# Patient Record
Sex: Male | Born: 1935
Health system: Southern US, Community
[De-identification: ages and names within clinical notes are randomized; demographics above are authoritative.]

## PROBLEM LIST (undated history)

## (undated) DIAGNOSIS — G4730 Sleep apnea, unspecified: Secondary | ICD-10-CM

## (undated) DIAGNOSIS — F0390 Unspecified dementia without behavioral disturbance: Secondary | ICD-10-CM

## (undated) DIAGNOSIS — I10 Essential (primary) hypertension: Secondary | ICD-10-CM

## (undated) DIAGNOSIS — I2510 Atherosclerotic heart disease of native coronary artery without angina pectoris: Secondary | ICD-10-CM

## (undated) DIAGNOSIS — F32A Depression, unspecified: Secondary | ICD-10-CM

## (undated) DIAGNOSIS — IMO0001 Reserved for inherently not codable concepts without codable children: Secondary | ICD-10-CM

## (undated) DIAGNOSIS — K219 Gastro-esophageal reflux disease without esophagitis: Secondary | ICD-10-CM

## (undated) DIAGNOSIS — H9190 Unspecified hearing loss, unspecified ear: Secondary | ICD-10-CM

## (undated) DIAGNOSIS — I639 Cerebral infarction, unspecified: Secondary | ICD-10-CM

## (undated) DIAGNOSIS — I252 Old myocardial infarction: Secondary | ICD-10-CM

## (undated) DIAGNOSIS — M542 Cervicalgia: Secondary | ICD-10-CM

## (undated) DIAGNOSIS — I4892 Unspecified atrial flutter: Secondary | ICD-10-CM

## (undated) DIAGNOSIS — R209 Unspecified disturbances of skin sensation: Secondary | ICD-10-CM

## (undated) DIAGNOSIS — R569 Unspecified convulsions: Secondary | ICD-10-CM

## (undated) DIAGNOSIS — M549 Dorsalgia, unspecified: Secondary | ICD-10-CM

## (undated) DIAGNOSIS — F1011 Alcohol abuse, in remission: Secondary | ICD-10-CM

## (undated) DIAGNOSIS — F329 Major depressive disorder, single episode, unspecified: Secondary | ICD-10-CM

## (undated) DIAGNOSIS — E119 Type 2 diabetes mellitus without complications: Secondary | ICD-10-CM

## (undated) HISTORY — DX: Unspecified atrial flutter: I48.92

## (undated) HISTORY — DX: Unspecified hearing loss, unspecified ear: H91.90

## (undated) HISTORY — DX: Cervicalgia: M54.2

## (undated) HISTORY — DX: Alcohol abuse, in remission: F10.11

## (undated) HISTORY — DX: Cerebral infarction, unspecified: I63.9

## (undated) HISTORY — PX: HERNIA REPAIR: SHX51

## (undated) HISTORY — DX: Sleep apnea, unspecified: G47.30

## (undated) HISTORY — DX: Atherosclerotic heart disease of native coronary artery without angina pectoris: I25.10

## (undated) HISTORY — PX: ROTATOR CUFF REPAIR: SHX139

## (undated) HISTORY — DX: Unspecified disturbances of skin sensation: R20.9

---

## 1999-03-14 ENCOUNTER — Emergency Department (HOSPITAL_COMMUNITY): Admission: EM | Admit: 1999-03-14 | Discharge: 1999-03-14 | Payer: Self-pay | Admitting: Emergency Medicine

## 2000-06-28 ENCOUNTER — Inpatient Hospital Stay (HOSPITAL_COMMUNITY): Admission: EM | Admit: 2000-06-28 | Discharge: 2000-06-29 | Payer: Self-pay | Admitting: Emergency Medicine

## 2000-06-28 ENCOUNTER — Encounter: Payer: Self-pay | Admitting: *Deleted

## 2001-10-31 ENCOUNTER — Inpatient Hospital Stay (HOSPITAL_COMMUNITY): Admission: AD | Admit: 2001-10-31 | Discharge: 2001-11-03 | Payer: Self-pay | Admitting: Cardiology

## 2001-10-31 ENCOUNTER — Encounter: Payer: Self-pay | Admitting: Emergency Medicine

## 2002-10-02 ENCOUNTER — Encounter: Payer: Self-pay | Admitting: Internal Medicine

## 2002-10-02 ENCOUNTER — Encounter: Admission: RE | Admit: 2002-10-02 | Discharge: 2002-10-02 | Payer: Self-pay | Admitting: Internal Medicine

## 2002-10-17 ENCOUNTER — Encounter: Admission: RE | Admit: 2002-10-17 | Discharge: 2002-10-17 | Payer: Self-pay | Admitting: Internal Medicine

## 2002-10-17 ENCOUNTER — Encounter: Payer: Self-pay | Admitting: Internal Medicine

## 2003-07-24 ENCOUNTER — Encounter: Payer: Self-pay | Admitting: Emergency Medicine

## 2003-07-24 ENCOUNTER — Inpatient Hospital Stay (HOSPITAL_COMMUNITY): Admission: EM | Admit: 2003-07-24 | Discharge: 2003-07-26 | Payer: Self-pay | Admitting: Emergency Medicine

## 2003-07-25 ENCOUNTER — Encounter: Payer: Self-pay | Admitting: Internal Medicine

## 2003-08-07 ENCOUNTER — Encounter: Admission: RE | Admit: 2003-08-07 | Discharge: 2003-09-12 | Payer: Self-pay | Admitting: Internal Medicine

## 2004-06-28 ENCOUNTER — Emergency Department (HOSPITAL_COMMUNITY): Admission: EM | Admit: 2004-06-28 | Discharge: 2004-06-28 | Payer: Self-pay | Admitting: Emergency Medicine

## 2004-10-11 ENCOUNTER — Emergency Department (HOSPITAL_COMMUNITY): Admission: EM | Admit: 2004-10-11 | Discharge: 2004-10-11 | Payer: Self-pay | Admitting: Emergency Medicine

## 2004-10-14 ENCOUNTER — Ambulatory Visit: Payer: Self-pay | Admitting: Internal Medicine

## 2004-10-15 ENCOUNTER — Ambulatory Visit: Payer: Self-pay | Admitting: Internal Medicine

## 2004-10-21 ENCOUNTER — Encounter: Admission: RE | Admit: 2004-10-21 | Discharge: 2004-10-21 | Payer: Self-pay | Admitting: Internal Medicine

## 2004-10-25 ENCOUNTER — Encounter: Admission: RE | Admit: 2004-10-25 | Discharge: 2004-10-25 | Payer: Self-pay | Admitting: Internal Medicine

## 2004-12-09 ENCOUNTER — Ambulatory Visit: Payer: Self-pay | Admitting: Internal Medicine

## 2005-01-06 ENCOUNTER — Ambulatory Visit: Payer: Self-pay | Admitting: Internal Medicine

## 2005-03-09 ENCOUNTER — Ambulatory Visit: Payer: Self-pay | Admitting: Internal Medicine

## 2005-03-26 ENCOUNTER — Ambulatory Visit: Payer: Self-pay | Admitting: Internal Medicine

## 2005-04-13 ENCOUNTER — Ambulatory Visit: Payer: Self-pay | Admitting: Internal Medicine

## 2005-04-15 ENCOUNTER — Ambulatory Visit: Payer: Self-pay | Admitting: Internal Medicine

## 2005-08-18 ENCOUNTER — Ambulatory Visit: Payer: Self-pay | Admitting: Internal Medicine

## 2005-08-23 ENCOUNTER — Ambulatory Visit: Payer: Self-pay | Admitting: Internal Medicine

## 2005-09-10 ENCOUNTER — Ambulatory Visit: Payer: Self-pay | Admitting: Internal Medicine

## 2005-12-06 ENCOUNTER — Ambulatory Visit: Admission: RE | Admit: 2005-12-06 | Discharge: 2005-12-06 | Payer: Self-pay | Admitting: Cardiology

## 2006-04-25 ENCOUNTER — Emergency Department (HOSPITAL_COMMUNITY): Admission: EM | Admit: 2006-04-25 | Discharge: 2006-04-25 | Payer: Self-pay | Admitting: Emergency Medicine

## 2006-07-12 ENCOUNTER — Ambulatory Visit: Payer: Self-pay | Admitting: Internal Medicine

## 2006-07-13 ENCOUNTER — Encounter: Admission: RE | Admit: 2006-07-13 | Discharge: 2006-07-13 | Payer: Self-pay | Admitting: Internal Medicine

## 2006-07-13 ENCOUNTER — Ambulatory Visit: Payer: Self-pay | Admitting: Internal Medicine

## 2006-08-02 ENCOUNTER — Ambulatory Visit: Payer: Self-pay | Admitting: Internal Medicine

## 2006-08-16 ENCOUNTER — Ambulatory Visit: Payer: Self-pay | Admitting: Internal Medicine

## 2006-10-30 ENCOUNTER — Emergency Department (HOSPITAL_COMMUNITY): Admission: EM | Admit: 2006-10-30 | Discharge: 2006-10-30 | Payer: Self-pay | Admitting: Emergency Medicine

## 2006-10-31 ENCOUNTER — Ambulatory Visit: Payer: Self-pay | Admitting: Internal Medicine

## 2006-12-03 ENCOUNTER — Emergency Department (HOSPITAL_COMMUNITY): Admission: EM | Admit: 2006-12-03 | Discharge: 2006-12-03 | Payer: Self-pay | Admitting: Emergency Medicine

## 2006-12-06 ENCOUNTER — Ambulatory Visit: Payer: Self-pay | Admitting: Internal Medicine

## 2007-01-25 DIAGNOSIS — I10 Essential (primary) hypertension: Secondary | ICD-10-CM | POA: Insufficient documentation

## 2007-01-25 DIAGNOSIS — K219 Gastro-esophageal reflux disease without esophagitis: Secondary | ICD-10-CM

## 2007-01-25 DIAGNOSIS — I509 Heart failure, unspecified: Secondary | ICD-10-CM | POA: Insufficient documentation

## 2007-01-25 DIAGNOSIS — E785 Hyperlipidemia, unspecified: Secondary | ICD-10-CM | POA: Insufficient documentation

## 2007-01-25 DIAGNOSIS — G4730 Sleep apnea, unspecified: Secondary | ICD-10-CM | POA: Insufficient documentation

## 2007-01-25 DIAGNOSIS — E291 Testicular hypofunction: Secondary | ICD-10-CM

## 2007-03-08 ENCOUNTER — Ambulatory Visit: Payer: Self-pay | Admitting: Internal Medicine

## 2007-03-08 DIAGNOSIS — F1011 Alcohol abuse, in remission: Secondary | ICD-10-CM

## 2007-03-08 DIAGNOSIS — Z8719 Personal history of other diseases of the digestive system: Secondary | ICD-10-CM

## 2007-03-08 DIAGNOSIS — I6350 Cerebral infarction due to unspecified occlusion or stenosis of unspecified cerebral artery: Secondary | ICD-10-CM | POA: Insufficient documentation

## 2007-03-08 DIAGNOSIS — I2510 Atherosclerotic heart disease of native coronary artery without angina pectoris: Secondary | ICD-10-CM | POA: Insufficient documentation

## 2007-03-13 ENCOUNTER — Telehealth (INDEPENDENT_AMBULATORY_CARE_PROVIDER_SITE_OTHER): Payer: Self-pay | Admitting: *Deleted

## 2007-03-14 ENCOUNTER — Ambulatory Visit: Payer: Self-pay | Admitting: Internal Medicine

## 2007-03-14 DIAGNOSIS — F341 Dysthymic disorder: Secondary | ICD-10-CM | POA: Insufficient documentation

## 2007-03-22 ENCOUNTER — Telehealth (INDEPENDENT_AMBULATORY_CARE_PROVIDER_SITE_OTHER): Payer: Self-pay | Admitting: *Deleted

## 2007-03-28 ENCOUNTER — Telehealth: Payer: Self-pay | Admitting: Internal Medicine

## 2007-04-03 ENCOUNTER — Encounter: Payer: Self-pay | Admitting: Internal Medicine

## 2007-04-05 ENCOUNTER — Telehealth: Payer: Self-pay | Admitting: Internal Medicine

## 2007-04-06 ENCOUNTER — Ambulatory Visit: Payer: Self-pay | Admitting: Internal Medicine

## 2007-04-06 DIAGNOSIS — R634 Abnormal weight loss: Secondary | ICD-10-CM | POA: Insufficient documentation

## 2007-04-12 ENCOUNTER — Ambulatory Visit: Payer: Self-pay | Admitting: Internal Medicine

## 2007-04-14 ENCOUNTER — Encounter: Payer: Self-pay | Admitting: Internal Medicine

## 2007-04-27 ENCOUNTER — Telehealth (INDEPENDENT_AMBULATORY_CARE_PROVIDER_SITE_OTHER): Payer: Self-pay | Admitting: *Deleted

## 2007-05-10 ENCOUNTER — Ambulatory Visit: Payer: Self-pay | Admitting: Psychiatry

## 2007-05-10 ENCOUNTER — Inpatient Hospital Stay (HOSPITAL_COMMUNITY): Admission: RE | Admit: 2007-05-10 | Discharge: 2007-05-14 | Payer: Self-pay | Admitting: Psychiatry

## 2007-05-10 ENCOUNTER — Other Ambulatory Visit: Payer: Self-pay

## 2007-05-12 ENCOUNTER — Encounter: Payer: Self-pay | Admitting: Internal Medicine

## 2007-05-18 ENCOUNTER — Telehealth: Payer: Self-pay | Admitting: Internal Medicine

## 2007-05-31 ENCOUNTER — Ambulatory Visit: Payer: Self-pay | Admitting: Internal Medicine

## 2007-06-27 ENCOUNTER — Telehealth (INDEPENDENT_AMBULATORY_CARE_PROVIDER_SITE_OTHER): Payer: Self-pay | Admitting: *Deleted

## 2007-08-08 ENCOUNTER — Telehealth (INDEPENDENT_AMBULATORY_CARE_PROVIDER_SITE_OTHER): Payer: Self-pay | Admitting: *Deleted

## 2007-08-09 ENCOUNTER — Ambulatory Visit: Payer: Self-pay | Admitting: Internal Medicine

## 2007-08-15 ENCOUNTER — Ambulatory Visit: Payer: Self-pay | Admitting: Internal Medicine

## 2007-08-21 ENCOUNTER — Ambulatory Visit: Payer: Self-pay | Admitting: Internal Medicine

## 2007-09-04 ENCOUNTER — Telehealth (INDEPENDENT_AMBULATORY_CARE_PROVIDER_SITE_OTHER): Payer: Self-pay | Admitting: *Deleted

## 2007-09-11 ENCOUNTER — Ambulatory Visit: Payer: Self-pay | Admitting: Internal Medicine

## 2007-09-13 ENCOUNTER — Ambulatory Visit: Payer: Self-pay | Admitting: Gastroenterology

## 2007-10-03 DIAGNOSIS — M129 Arthropathy, unspecified: Secondary | ICD-10-CM | POA: Insufficient documentation

## 2007-10-03 DIAGNOSIS — I209 Angina pectoris, unspecified: Secondary | ICD-10-CM

## 2007-10-03 DIAGNOSIS — K2270 Barrett's esophagus without dysplasia: Secondary | ICD-10-CM

## 2007-10-11 ENCOUNTER — Ambulatory Visit: Payer: Self-pay | Admitting: Internal Medicine

## 2007-11-13 ENCOUNTER — Other Ambulatory Visit: Payer: Self-pay

## 2007-11-14 ENCOUNTER — Ambulatory Visit: Payer: Self-pay | Admitting: Psychiatry

## 2007-11-14 ENCOUNTER — Inpatient Hospital Stay (HOSPITAL_COMMUNITY): Admission: EM | Admit: 2007-11-14 | Discharge: 2007-11-20 | Payer: Self-pay | Admitting: Psychiatry

## 2007-11-30 ENCOUNTER — Telehealth: Payer: Self-pay | Admitting: Internal Medicine

## 2007-12-05 ENCOUNTER — Inpatient Hospital Stay (HOSPITAL_COMMUNITY): Admission: EM | Admit: 2007-12-05 | Discharge: 2007-12-07 | Payer: Self-pay | Admitting: Emergency Medicine

## 2007-12-05 ENCOUNTER — Ambulatory Visit: Payer: Self-pay | Admitting: Internal Medicine

## 2007-12-08 ENCOUNTER — Other Ambulatory Visit: Payer: Self-pay | Admitting: Emergency Medicine

## 2007-12-08 ENCOUNTER — Inpatient Hospital Stay (HOSPITAL_COMMUNITY): Admission: RE | Admit: 2007-12-08 | Discharge: 2007-12-16 | Payer: Self-pay | Admitting: Psychiatry

## 2007-12-11 ENCOUNTER — Ambulatory Visit: Payer: Self-pay | Admitting: Gastroenterology

## 2007-12-18 ENCOUNTER — Ambulatory Visit: Payer: Self-pay | Admitting: Internal Medicine

## 2007-12-19 ENCOUNTER — Telehealth: Payer: Self-pay | Admitting: Internal Medicine

## 2007-12-27 ENCOUNTER — Telehealth: Payer: Self-pay | Admitting: Internal Medicine

## 2008-01-19 ENCOUNTER — Telehealth: Payer: Self-pay | Admitting: Internal Medicine

## 2008-02-12 ENCOUNTER — Telehealth (INDEPENDENT_AMBULATORY_CARE_PROVIDER_SITE_OTHER): Payer: Self-pay | Admitting: *Deleted

## 2008-02-14 ENCOUNTER — Ambulatory Visit: Payer: Self-pay | Admitting: Internal Medicine

## 2008-02-14 DIAGNOSIS — R7309 Other abnormal glucose: Secondary | ICD-10-CM

## 2008-02-18 ENCOUNTER — Emergency Department (HOSPITAL_COMMUNITY): Admission: EM | Admit: 2008-02-18 | Discharge: 2008-02-18 | Payer: Self-pay | Admitting: Emergency Medicine

## 2008-02-28 ENCOUNTER — Telehealth: Payer: Self-pay | Admitting: Internal Medicine

## 2008-03-12 ENCOUNTER — Ambulatory Visit: Payer: Self-pay | Admitting: Internal Medicine

## 2008-03-12 DIAGNOSIS — M109 Gout, unspecified: Secondary | ICD-10-CM | POA: Insufficient documentation

## 2008-04-03 ENCOUNTER — Encounter: Payer: Self-pay | Admitting: Internal Medicine

## 2008-05-13 ENCOUNTER — Telehealth (INDEPENDENT_AMBULATORY_CARE_PROVIDER_SITE_OTHER): Payer: Self-pay | Admitting: *Deleted

## 2008-08-28 ENCOUNTER — Encounter: Payer: Self-pay | Admitting: Internal Medicine

## 2008-10-14 ENCOUNTER — Telehealth (INDEPENDENT_AMBULATORY_CARE_PROVIDER_SITE_OTHER): Payer: Self-pay | Admitting: *Deleted

## 2009-07-01 ENCOUNTER — Encounter: Payer: Self-pay | Admitting: Cardiology

## 2009-08-08 ENCOUNTER — Ambulatory Visit: Payer: Self-pay | Admitting: Cardiology

## 2009-08-08 ENCOUNTER — Encounter: Payer: Self-pay | Admitting: Emergency Medicine

## 2009-08-08 ENCOUNTER — Ambulatory Visit: Payer: Self-pay | Admitting: Diagnostic Radiology

## 2009-08-08 ENCOUNTER — Inpatient Hospital Stay (HOSPITAL_COMMUNITY): Admission: EM | Admit: 2009-08-08 | Discharge: 2009-08-16 | Payer: Self-pay | Admitting: Cardiology

## 2009-08-11 ENCOUNTER — Encounter: Payer: Self-pay | Admitting: Internal Medicine

## 2009-08-18 ENCOUNTER — Telehealth: Payer: Self-pay | Admitting: Cardiology

## 2009-08-19 ENCOUNTER — Ambulatory Visit: Payer: Self-pay | Admitting: Cardiology

## 2009-08-19 LAB — CONVERTED CEMR LAB: POC INR: 2.4

## 2009-08-26 ENCOUNTER — Ambulatory Visit: Payer: Self-pay | Admitting: Cardiovascular Disease

## 2009-08-26 LAB — CONVERTED CEMR LAB: POC INR: 3

## 2009-09-02 ENCOUNTER — Ambulatory Visit: Payer: Self-pay | Admitting: Cardiology

## 2009-09-03 ENCOUNTER — Encounter: Payer: Self-pay | Admitting: Physician Assistant

## 2009-09-03 ENCOUNTER — Ambulatory Visit: Payer: Self-pay | Admitting: Cardiology

## 2009-09-03 DIAGNOSIS — I4892 Unspecified atrial flutter: Secondary | ICD-10-CM

## 2009-09-03 DIAGNOSIS — I429 Cardiomyopathy, unspecified: Secondary | ICD-10-CM | POA: Insufficient documentation

## 2009-09-09 ENCOUNTER — Ambulatory Visit: Payer: Self-pay | Admitting: Cardiovascular Disease

## 2009-09-09 LAB — CONVERTED CEMR LAB: POC INR: 2

## 2009-09-17 ENCOUNTER — Ambulatory Visit: Payer: Self-pay | Admitting: Cardiology

## 2009-09-17 ENCOUNTER — Encounter (INDEPENDENT_AMBULATORY_CARE_PROVIDER_SITE_OTHER): Payer: Self-pay | Admitting: Cardiology

## 2009-09-17 LAB — CONVERTED CEMR LAB: POC INR: 2

## 2009-09-30 ENCOUNTER — Ambulatory Visit: Payer: Self-pay | Admitting: Cardiology

## 2009-10-13 ENCOUNTER — Ambulatory Visit (HOSPITAL_COMMUNITY): Admission: RE | Admit: 2009-10-13 | Discharge: 2009-10-13 | Payer: Self-pay | Admitting: Cardiology

## 2009-10-13 ENCOUNTER — Ambulatory Visit: Payer: Self-pay | Admitting: Internal Medicine

## 2009-10-13 ENCOUNTER — Ambulatory Visit: Payer: Self-pay | Admitting: Cardiology

## 2009-10-13 ENCOUNTER — Encounter: Payer: Self-pay | Admitting: Cardiology

## 2009-10-13 ENCOUNTER — Ambulatory Visit: Payer: Self-pay

## 2009-10-23 ENCOUNTER — Encounter: Payer: Self-pay | Admitting: Cardiology

## 2009-10-28 ENCOUNTER — Ambulatory Visit: Payer: Self-pay | Admitting: Cardiovascular Disease

## 2009-11-11 ENCOUNTER — Ambulatory Visit: Payer: Self-pay | Admitting: Cardiology

## 2009-12-09 ENCOUNTER — Ambulatory Visit: Payer: Self-pay | Admitting: Cardiology

## 2009-12-09 DIAGNOSIS — E669 Obesity, unspecified: Secondary | ICD-10-CM | POA: Insufficient documentation

## 2009-12-18 ENCOUNTER — Encounter (INDEPENDENT_AMBULATORY_CARE_PROVIDER_SITE_OTHER): Payer: Self-pay | Admitting: *Deleted

## 2010-01-06 ENCOUNTER — Ambulatory Visit: Payer: Self-pay | Admitting: Internal Medicine

## 2010-01-13 ENCOUNTER — Telehealth: Payer: Self-pay | Admitting: Cardiology

## 2010-01-14 ENCOUNTER — Encounter: Payer: Self-pay | Admitting: Cardiology

## 2010-01-16 ENCOUNTER — Telehealth: Payer: Self-pay | Admitting: Cardiology

## 2010-01-20 ENCOUNTER — Ambulatory Visit: Payer: Self-pay | Admitting: Cardiovascular Disease

## 2010-01-20 LAB — CONVERTED CEMR LAB: POC INR: 1.3

## 2010-01-27 ENCOUNTER — Telehealth (INDEPENDENT_AMBULATORY_CARE_PROVIDER_SITE_OTHER): Payer: Self-pay | Admitting: *Deleted

## 2010-01-30 ENCOUNTER — Ambulatory Visit: Payer: Self-pay | Admitting: Cardiology

## 2010-01-30 LAB — CONVERTED CEMR LAB: POC INR: 1.8

## 2010-02-03 ENCOUNTER — Encounter (INDEPENDENT_AMBULATORY_CARE_PROVIDER_SITE_OTHER): Payer: Self-pay | Admitting: *Deleted

## 2010-02-16 ENCOUNTER — Ambulatory Visit: Payer: Self-pay | Admitting: Cardiovascular Disease

## 2010-02-27 ENCOUNTER — Ambulatory Visit: Payer: Self-pay | Admitting: Cardiology

## 2010-02-27 LAB — CONVERTED CEMR LAB: POC INR: 1.6

## 2010-03-13 ENCOUNTER — Ambulatory Visit: Payer: Self-pay | Admitting: Cardiovascular Disease

## 2010-03-13 LAB — CONVERTED CEMR LAB: POC INR: 2.4

## 2010-04-10 ENCOUNTER — Ambulatory Visit: Payer: Self-pay | Admitting: Cardiology

## 2010-04-17 ENCOUNTER — Ambulatory Visit: Payer: Self-pay | Admitting: Cardiology

## 2010-04-27 ENCOUNTER — Ambulatory Visit: Payer: Self-pay | Admitting: Cardiology

## 2010-05-04 ENCOUNTER — Ambulatory Visit: Payer: Self-pay | Admitting: Cardiovascular Disease

## 2010-05-15 ENCOUNTER — Ambulatory Visit: Payer: Self-pay | Admitting: Internal Medicine

## 2010-05-29 ENCOUNTER — Ambulatory Visit: Payer: Self-pay | Admitting: Cardiology

## 2010-06-12 ENCOUNTER — Ambulatory Visit: Payer: Self-pay | Admitting: Internal Medicine

## 2010-06-12 LAB — CONVERTED CEMR LAB: POC INR: 2.6

## 2010-06-19 ENCOUNTER — Ambulatory Visit: Payer: Self-pay | Admitting: Cardiology

## 2010-09-25 ENCOUNTER — Encounter: Payer: Self-pay | Admitting: Cardiology

## 2010-11-03 NOTE — Medication Information (Signed)
Summary: rov/jk   Anticoagulant Therapy  Managed by: Weston Brass, PharmD Referring MD: Dr. Dietrich Pates PCP: Dr Jackie Plum Supervising MD: Antoine Poche MD, Fayrene Fearing Indication 1: Atrial Flutter Lab Used: [facility] [place] Site: [address] INR POC 3.6 INR RANGE 2-3  Dietary changes: no    Health status changes: no    Bleeding/hemorrhagic complications: no    Recent/future hospitalizations: no    Any changes in medication regimen? no    Recent/future dental: no  Any missed doses?: no       Is patient compliant with meds? yes       Allergies: No Known Drug Allergies  Anticoagulation Management History:      The patient is taking warfarin and comes in today for a routine follow up visit.  Positive risk factors for bleeding include an age of 74 years or older and history of CVA/TIA.  The bleeding index is 'intermediate risk'.  Positive CHADS2 values include History of CHF, History of HTN, and Prior Stroke/CVA/TIA.  Negative CHADS2 values include Age > 74 years old.  Anticoagulation responsible provider: Antoine Poche MD, Fayrene Fearing.  INR POC: 3.6.  Cuvette Lot#: [id number].  Exp: 07/2011.    Anticoagulation Management Assessment/Plan:      The patient's current anticoagulation dose is Coumadin 10 mg tabs: Use as directed by [facility] Clinic.  The target INR is 2.0-3.0.  The next INR is due 06/12/2010.  Anticoagulation instructions were given to patient.  Results were reviewed/authorized by Weston Brass, PharmD.  He was notified by Gweneth Fritter, PharmD Candidate.         Prior Anticoagulation Instructions: INR 3.7  Skip today's dose.  Then resume normal schedule of taking 1 tablet (10mg ) every day except take 1.5 tablets (15mg ) on Fridays.  Recheck in 2 weeks.   Current Anticoagulation Instructions: INR 3.6  Today, August 26th, skip your dose.  Then being new Coumadin schedule of taking 1 tablet (10mg ) every day.  Recheck in 2 weeks.

## 2010-11-03 NOTE — Medication Information (Signed)
Summary: rov/tm  Anticoagulant Therapy  Managed by: Weston Brass, PharmD Referring MD: Dr. Dietrich Pates PCP: Dr Jackie Plum Supervising MD: Tenny Craw MD, Gunnar Fusi Indication 1: Atrial Flutter Lab Used: [facility] [place] Site: [address] INR POC 3.7 INR RANGE 2-3  Dietary changes: yes       Details: Began taking a "green supplement" powder that is mixed with water.  Has "all the daily vitamins and minerals" - take it as a shake in the morning.  Health status changes: no    Bleeding/hemorrhagic complications: no    Recent/future hospitalizations: no    Any changes in medication regimen? no    Recent/future dental: no  Any missed doses?: no       Is patient compliant with meds? yes       Allergies: No Known Drug Allergies  Anticoagulation Management History:      The patient is taking warfarin and comes in today for a routine follow up visit.  Positive risk factors for bleeding include an age of 50 years or older and history of CVA/TIA.  The bleeding index is 'intermediate risk'.  Positive CHADS2 values include History of CHF, History of HTN, and Prior Stroke/CVA/TIA.  Negative CHADS2 values include Age > 55 years old.  Anticoagulation responsible provider: Tenny Craw MD, Gunnar Fusi.  INR POC: 3.7.  Cuvette Lot#: [id number].  Exp: 07/2011.    Anticoagulation Management Assessment/Plan:      The patient's current anticoagulation dose is Coumadin 10 mg tabs: Use as directed by [facility]madin Clinic.  The target INR is 2.0-3.0.  The next INR is due 05/29/2010.  Anticoagulation instructions were given to patient.  Results were reviewed/authorized by Weston Brass, PharmD.  He was notified by Gweneth Fritter, PharmD Candidate.         Prior Anticoagulation Instructions: INR 1.4 Today take 1.5 tablets then continue 1  tablet everyday except 1.5 tablets on Fridays. Recheck in 11 days.   Current Anticoagulation Instructions: INR 3.7  Skip today's dose.  Then resume normal schedule of taking 1  tablet (10mg ) every day except take 1.5 tablets (15mg ) on Fridays.  Recheck in 2 weeks.

## 2010-11-03 NOTE — Letter (Signed)
Summary: Primary Care Appointment Letter  [facility] [place]  [address] [place], [place] [address]   Phone: [phone]  Fax: [phone]    12/18/2009 MRN: [id number]  Austin Sawyer [address] [place], [place]  [address]  Dear Austin Sawyer,   Your Primary Care Physician Jasper E. Paz MD has indicated that:    ____x___it is time to schedule an appointment for yearly exam and labs last office visit w/ Dr. Drue Novel was 02/2008, this is necessary to continue refilling medication.    _______you missed your appointment on______ and need to call and          reschedule.    _______you need to have lab work done.    _______you need to schedule an appointment discuss lab or test results.    _______you need to call to reschedule your appointment that is                       scheduled on _________.     Please call our office as soon as possible. Our phone number is [phone]          ___[phone]___. Our office is open 8a-5p, Monday through Friday.     Thank you,    [facility]uler

## 2010-11-03 NOTE — Assessment & Plan Note (Signed)
Summary: rov pt had echo 10-13-09      Allergies Added: NKDA  Visit Type:  Follow-up Primary Provider:  Dr Greggory Stallion Osei-Bonsu  CC:  Cardiomyopathy.  History of Present Illness: The patient presents for followup of his reduced ejection fraction. This was noted on transesophageal echocardiography in November of last year at the time of an atrial flutter ablation. The EF at that time was 35% which was reduced compared with his previous normal ejection fractions. He didn't followup in our clinic following this. An echocardiogram done recently demonstrates his ejection fraction to now be back to 65%. He currently denies any new symptoms. He has been having no new dyspnea though he is relatively sedentary. He is only exercising one time per week. He does not have any resting shortness of breath and has had no PND or orthopnea. He has had no palpitations, presyncope or syncope. He has had no chest, neck or arm discomfort.  Current Medications (verified): 1)  Vytorin 10-80 Mg Tabs (Ezetimibe-Simvastatin) .Marland Kitchen.. 1 By Mouth At Bedtime 2)  Androgel Pump 1 % Gel (Testosterone) .... Apply 2 Pumpfulls Daily. 3)  Micardis Hct 80-25 Mg Tabs (Telmisartan-Hctz) .Marland Kitchen.. 1 By Mouth Once Daily-Office Visit Due Now 4)  Flomax 0.4 Mg Cp24 (Tamsulosin Hcl) .Marland Kitchen.. 1 By Mouth Qd 5)  Nexium 40 Mg Cpdr (Esomeprazole Magnesium) .Marland Kitchen.. 1 By Mouth Once Daily- 6)  Flonase 50 Mcg/act Susp (Fluticasone Propionate) .... 2 Sprays Each Nostril Daily As Needed. 7)  Colchicine 0.6 Mg  Tabs (Colchicine) .Marland Kitchen.. 1 By Mouth Q4 Hours As Needed. 8)  Remeron 15 Mg  Tabs (Mirtazapine) .Marland Kitchen.. 1daily At Bedtime. 9)  No More Benzos--Xanax 10)  Coumadin 10 Mg Tabs (Warfarin Sodium) .... Use As Directed By Coumadin Clinic 11)  Carvedilol 12.5 Mg Tabs (Carvedilol) .... Take One Tablet By Mouth Twice A Day  Allergies (verified): No Known Drug Allergies  Past History:  Past Medical History: GERD, positive history of  Barrett's Hyperlipidemia Hypertension Congestive heart failure Coronary artery disease, stent in 2001 H/O ETOH abuse Anxiety-depression Gout Atrial flutter status post ablation  Past Surgical History: Inguinal herniorrhaphy R shoulder, roatator cuff  Review of Systems       As stated in the HPI and negative for all other systems.   Vital Signs:  Patient profile:   76 year old male Height:      69 inches Weight:      240 pounds BMI:     35.57 Pulse rate:   68 / minute Resp:     16 per minute BP sitting:   108 / 74  (right arm)  Vitals Entered By: Marrion Coy, CNA (December 09, 2009 10:11 AM)  Physical Exam  General:  Well developed, well nourished, in no acute distress. Head:  normocephalic and atraumatic Neck:  Neck supple, no JVD. No masses, thyromegaly or abnormal cervical nodes. Chest Wall:  no deformities or breast masses noted Lungs:  Clear bilaterally to auscultation and percussion. Heart:  Non-displaced PMI, chest non-tender; regular rate and rhythm, S1, S2 without murmurs, rubs or gallops. Carotid upstroke normal, no bruit. Normal abdominal aortic size, no bruits. Femorals normal pulses, no bruits. Pedals normal pulses. No edema, no varicosities. Abdomen:  Bowel sounds positive; abdomen soft and non-tender without masses, organomegaly, or hernias noted. No hepatosplenomegaly, obese Msk:  Back normal, normal gait. Muscle strength and tone normal. Extremities:  No clubbing or cyanosis. Neurologic:  Alert and oriented x 3. Skin:  Intact without lesions or rashes. Psych:  Normal affect.   EKG  Procedure date:  12/09/2009  Findings:      sinus rhythm, rate 68, axis within normal limits, intervals within normal limits, left ventricular hypertrophy with repolarization changes  Impression & Recommendations:  Problem # 1:  CARDIOMYOPATHY, SECONDARY (ICD-425.9) His EF is improved. I suspect the atrial flutter was the cause. He seems to be maintaining sinus rhythm  but should continue the other meds as listed as well. I will not titrate meds further at this point.  Problem # 2:  ATRIAL FLUTTER (ICD-427.32) He seems to be maintaining sinus rhythm. There is no evidence of recurrent arrhythmia I will most likely discontinue Coumadin when I see him next. Orders: EKG w/ Interpretation (93000)  Problem # 3:  CORONARY ARTERY DISEASE (ICD-414.00) He has had no ongoing chest pain and I will defer to his primary physician. Orders: EKG w/ Interpretation (93000)  Problem # 4:  OBESITY, UNSPECIFIED (ICD-278.00) I spent a long time at this meeting discussing the need for weight loss with diet and exercise.  Patient Instructions: 1)  Your physician recommends that you schedule a follow-up appointment in: 6 months with Dr Antoine Poche 2)  Your physician recommends that you continue on your current medications as directed. Please refer to the Current Medication list given to you today. 3)  You have been diagnosed with atrial fibrillation.  Atrial fibrillation is a condition in which one of the upper chambers of the heart has extra electrical cells causing it to beat very fast.  Please see the handout/brochure given to you today for further information.

## 2010-11-03 NOTE — Assessment & Plan Note (Signed)
Summary: 6 month rov 427.31  pfh,rn      Allergies Added: NKDA  Visit Type:  Follow-up Primary Provider:  Dr Greggory Stallion Osei-Bonsu  CC:  Cardiomyopathy and Atrial Flutter.  History of Present Illness: The patient presents for followup of cardiomyopathy and previous flutter. He has had ablation. At the last appointment he was maintaining sinus rhythm. Brought him back today to consider discontinuing Coumadin therapy. Since then he's had no symptomatic tachypalpitations. He has had no presyncope or syncope. He has had no shortness of breath, PND or orthopnea. He denies any chest pressure, neck or arm discomfort. He has lost about 20 pounds over the past year or so. He says he is not eating as much after getting dentures. He says the food doesn't taste as good.  Current Medications (verified): 1)  Vytorin 10-80 Mg Tabs (Ezetimibe-Simvastatin) .Marland Kitchen.. 1 By Mouth At Bedtime 2)  Androgel Pump 1 % Gel (Testosterone) .... Apply 2 Pumpfulls Daily. 3)  Micardis Hct 80-25 Mg Tabs (Telmisartan-Hctz) .Marland Kitchen.. 1 By Mouth Once Daily-Office Visit Due Before Additional Refills. 4)  Flomax 0.4 Mg Cp24 (Tamsulosin Hcl) .Marland Kitchen.. 1 By Mouth Qd 5)  Nexium 40 Mg Cpdr (Esomeprazole Magnesium) .Marland Kitchen.. 1 By Mouth Once Daily- 6)  Flonase 50 Mcg/act Susp (Fluticasone Propionate) .... 2 Sprays Each Nostril Daily As Needed. 7)  Colchicine 0.6 Mg  Tabs (Colchicine) .Marland Kitchen.. 1 By Mouth Daily 8)  Remeron 15 Mg  Tabs (Mirtazapine) .Marland Kitchen.. 1daily At Bedtime. 9)  No More Benzos--Xanax 10)  Coumadin 10 Mg Tabs (Warfarin Sodium) .... Use As Directed By Coumadin Clinic 11)  Carvedilol 12.5 Mg Tabs (Carvedilol) .... Take One Tablet By Mouth Twice A Day 12)  Indomethacin 25 Mg Caps (Indomethacin) .... Take One To Two Capsule Three Times A Day Prn  Allergies (verified): No Known Drug Allergies  Past History:  Past Medical History: Reviewed history from 12/09/2009 and no changes required. GERD, positive history of  Barrett's Hyperlipidemia Hypertension Congestive heart failure Coronary artery disease, stent in 2001 H/O ETOH abuse Anxiety-depression Gout Atrial flutter status post ablation  Past Surgical History: Reviewed history from 12/09/2009 and no changes required. Inguinal herniorrhaphy R shoulder, roatator cuff  Review of Systems       As stated in the HPI and negative for all other systems.   Vital Signs:  Patient profile:   76 year old male Height:      69 inches Weight:      221 pounds BMI:     32.75 Pulse rate:   52 / minute Resp:     18 per minute BP sitting:   118 / 90  (right arm)  Vitals Entered By: Marrion Coy, CNA (June 19, 2010 12:03 PM)  Physical Exam  General:  Well developed, well nourished, in no acute distress. Head:  normocephalic and atraumatic Eyes:  PERRLA/EOM intact; conjunctiva and lids normal. Mouth:  Upper dentures. Oral mucosa normal. Neck:  Neck supple, no JVD. No masses, thyromegaly or abnormal cervical nodes. Chest Wall:  no deformities or breast masses noted Lungs:  Clear bilaterally to auscultation and percussion. Abdomen:  Bowel sounds positive; abdomen soft and non-tender without masses, organomegaly, or hernias noted. No hepatosplenomegaly, obese Msk:  Back normal, normal gait. Muscle strength and tone normal. Extremities:  No clubbing or cyanosis. Neurologic:  Alert and oriented x 3. Skin:  Intact without lesions or rashes. Cervical Nodes:  no significant adenopathy Inguinal Nodes:  no significant adenopathy Psych:  Normal affect.   Detailed Cardiovascular Exam  Neck    Carotids: Carotids full and equal bilaterally without bruits.      Neck Veins: Normal, no JVD.    Heart    Inspection: no deformities or lifts noted.      Palpation: normal PMI with no thrills palpable.      Auscultation: regular rate and rhythm, S1, S2 without murmurs, rubs, gallops, or clicks.    Vascular    Abdominal Aorta: no palpable masses,  pulsations, or audible bruits.      Femoral Pulses: normal femoral pulses bilaterally.      Pedal Pulses: normal pedal pulses bilaterally.      Radial Pulses: normal radial pulses bilaterally.      Peripheral Circulation: no clubbing, cyanosis, or edema noted with normal capillary refill.     EKG  Procedure date:  06/19/2010  Findings:      Sinus bradycardia, rate 52, axis within normal limits, intervals within normal limits, LVH  Impression & Recommendations:  Problem # 1:  ATRIAL FLUTTER (ICD-427.32) At this point I believe the patient's atrial flutter is cured. Therefore, he can come off of his Coumadin.  Orders: EKG w/ Interpretation (93000)  Problem # 2:  CARDIOMYOPATHY, SECONDARY (ICD-425.9) The patient will continue with the meds as listed. His ejection fraction greatly improved with medication and treatment of his flutter.  Problem # 3:  OBESITY, UNSPECIFIED (ICD-278.00) We discussed the need to lose weight with diet and exercise.  Problem # 4:  HYPERTENSION (ICD-401.9) His blood pressure is well controlled with the meds as listed.  Patient Instructions: 1)  Your physician recommends that you schedule a follow-up appointment in: 1 year with Dr Antoine Poche 2)  Your physician has recommended you make the following change in your medication: Stop Coumadin

## 2010-11-03 NOTE — Miscellaneous (Signed)
Summary: PATIENT HAS CHANGED PCP   Clinical Lists Changes

## 2010-11-03 NOTE — Medication Information (Signed)
Summary: rov/sp  Anticoagulant Therapy  Managed by: Bethena Midget, RN, BSN Referring MD: Dr. Dietrich Pates PCP: Dr Jackie Plum Supervising MD: Eden Emms MD, Theron Arista Indication 1: Atrial Flutter Lab Used: [facility] [place] Site: [address] INR POC 1.4 INR RANGE 2-3  Dietary changes: no    Health status changes: no    Bleeding/hemorrhagic complications: no    Recent/future hospitalizations: no    Any changes in medication regimen? no    Recent/future dental: no  Any missed doses?: no       Is patient compliant with meds? yes       Allergies: No Known Drug Allergies  Anticoagulation Management History:      The patient is taking warfarin and comes in today for a routine follow up visit.  Positive risk factors for bleeding include an age of 1 years or older and history of CVA/TIA.  The bleeding index is 'intermediate risk'.  Positive CHADS2 values include History of CHF, History of HTN, and Prior Stroke/CVA/TIA.  Negative CHADS2 values include Age > 19 years old.  Anticoagulation responsible provider: Eden Emms MD, Theron Arista.  INR POC: 1.4.  Cuvette Lot#: [id number].  Exp: 07/2011.    Anticoagulation Management Assessment/Plan:      The patient's current anticoagulation dose is Coumadin 10 mg tabs: Use as directed by Coumadin Clinic.  The target INR is 2.0-3.0.  The next INR is due 05/15/2010.  Anticoagulation instructions were given to patient.  Results were reviewed/authorized by Bethena Midget, RN, BSN.  He was notified by Bethena Midget, RN, BSN.         Prior Anticoagulation Instructions: INR 4.8  Skip Tuesday and Wednesday's dose of Coumadin then decrease dose to 1 tablet every day except 1 1/2 tablets on Friday.   Current Anticoagulation Instructions: INR 1.4 Today take 1.5 tablets then continue 1  tablet everyday except 1.5 tablets on Fridays. Recheck in 11 days.

## 2010-11-03 NOTE — Medication Information (Signed)
Summary: rov/eac  Anticoagulant Therapy  Managed by: Cloyde Reams, RN, BSN Referring MD: Dr. Dietrich Pates PCP: Dr Jackie Plum Supervising MD: Myrtis Ser MD, Tinnie Gens Indication 1: Atrial Flutter Lab Used: [facility] [place] Site: [address] INR POC 2.0 INR RANGE 2-3  Dietary changes: no    Health status changes: no    Bleeding/hemorrhagic complications: no    Recent/future hospitalizations: no    Any changes in medication regimen? no    Recent/future dental: no  Any missed doses?: no       Is patient compliant with meds? yes      Comments: Pt states he has only been taking 1 tablet daily.  Boosted x 1 week then resumed previous dosage.  Allergies (verified): No Known Drug Allergies  Anticoagulation Management History:      The patient is taking warfarin and comes in today for a routine follow up visit.  Positive risk factors for bleeding include an age of 5 years or older and history of CVA/TIA.  The bleeding index is 'intermediate risk'.  Positive CHADS2 values include History of CHF, History of HTN, and Prior Stroke/CVA/TIA.  Negative CHADS2 values include Age > 68 years old.  Anticoagulation responsible provider: Myrtis Ser MD, Tinnie Gens.  INR POC: 2.0.  Cuvette Lot#: [id number].  Exp: 02/2011.    Anticoagulation Management Assessment/Plan:      The patient's current anticoagulation dose is Coumadin 10 mg tabs: Use as directed by Coumadin Clinic.  The target INR is 2.0-3.0.  The next INR is due 01/06/2010.  Anticoagulation instructions were given to patient.  Results were reviewed/authorized by Cloyde Reams, RN, BSN.  He was notified by Cloyde Reams, RN, BSN.         Prior Anticoagulation Instructions: INR 1.7  Take 1.5 Tablets today.  Then start NEW dosing schedule of 1.5 tablets on Thursday, and 1 tablet all other days. Return to clinic in 4 weeks at which time pt is also seeing Dr. Antoine Poche.  Current Anticoagulation Instructions: INR 2.0  Start taking 1  tablet daily except 1.5 tablets on Tuesdays.  Recheck in 4 weeks.

## 2010-11-03 NOTE — Miscellaneous (Signed)
  Clinical Lists Changes  Observations: Added new observation of ECHOINTERP: - Left ventricle: The cavity size was normal. Wall thickness was       increased increased in a pattern of mild to moderate LVH. Systolic       function was normal. The estimated ejection fraction was in the       range of 60% to 65%. Wall motion was normal; there were no       regional wall motion abnormalities.     - Aortic valve: Trivial regurgitation.     - Mitral valve: Calcified annulus. (10/13/2009 11:26)      Echocardiogram  Procedure date:  10/13/2009  Findings:      - Left ventricle: The cavity size was normal. Wall thickness was       increased increased in a pattern of mild to moderate LVH. Systolic       function was normal. The estimated ejection fraction was in the       range of 60% to 65%. Wall motion was normal; there were no       regional wall motion abnormalities.     - Aortic valve: Trivial regurgitation.     - Mitral valve: Calcified annulus.

## 2010-11-03 NOTE — Letter (Signed)
Summary: Dr. Fredirick Maudlin Office - Release for Treatment  Dr. Fredirick Maudlin Office - Release for Treatment   Imported By: Marylou Mccoy 03/11/2010 10:13:22  _____________________________________________________________________  External Attachment:    Type:   Image     Comment:   External Document

## 2010-11-03 NOTE — Progress Notes (Signed)
Summary: status of paperwork   Phone Note Call from Patient Call back at Home Phone [phone]   Caller: Patient Summary of Call: pt called stating he had dropped off paperwork for holding his coumadin for dental work when he saw couamdin clinic last week and was wanting to see if it was ready, will send to [facility] to call pt when ready Initial call taken by: Meredith Staggers, RN,  January 13, 2010 3:24 PM  Follow-up for Phone Call        Done Follow-up by: Rollene Rotunda, MD, [facility],  January 14, 2010 6:03 PM     Appended Document: status of paperwork pt aware paperwork has been completed.  information will be faxed to Dr Baldo Ash Little's office at the pt's request.  Appended Document: status of paperwork Refaxed paperwork for Pam..to Dr.Carl Little's Office

## 2010-11-03 NOTE — Medication Information (Signed)
Summary: rov/tm  Anticoagulant Therapy  Managed by: Elaina Pattee, PharmD Referring MD: Dr. Dietrich Pates PCP: Dr Jackie Plum Supervising MD: Excell Seltzer MD, Casimiro Needle Indication 1: Atrial Flutter Lab Used: [facility] [place] Site: [address] INR POC 1.3 INR RANGE 2-3  Dietary changes: no    Health status changes: no    Bleeding/hemorrhagic complications: no    Recent/future hospitalizations: no    Any changes in medication regimen? no    Recent/future dental: yes     Details: Tooth extraction this Thurs 4/21.  Coumadin on hold since Saturday. Cleared by cardiologist.  Any missed doses?: yes     Details: Holding for tooth extraction.  Is patient compliant with meds? yes       Allergies: No Known Drug Allergies  Anticoagulation Management History:      The patient is taking warfarin and comes in today for a routine follow up visit.  Positive risk factors for bleeding include an age of 69 years or older and history of CVA/TIA.  The bleeding index is 'intermediate risk'.  Positive CHADS2 values include History of CHF, History of HTN, and Prior Stroke/CVA/TIA.  Negative CHADS2 values include Age > 47 years old.  Anticoagulation responsible provider: Excell Seltzer MD, Casimiro Needle.  INR POC: 1.3.  Cuvette Lot#: [id number].  Exp: 03/2011.    Anticoagulation Management Assessment/Plan:      The patient's current anticoagulation dose is Coumadin 10 mg tabs: Use as directed by Coumadin Clinic.  The target INR is 2.0-3.0.  The next INR is due 01/30/2010.  Anticoagulation instructions were given to patient.  Results were reviewed/authorized by Elaina Pattee, PharmD.  He was notified by Elaina Pattee, PharmD.         Prior Anticoagulation Instructions: INR 3.3 Skip Wednesdays dose then resume 10mg s daily except 15mg s on Tuesdays. Recheck in 2 weeks.   Current Anticoagulation Instructions: INR 1.3. On 4/23, take 1.5 tablets for 2 days, then take 1 tablet daily except 1.5 tablets on  Fridays.

## 2010-11-03 NOTE — Medication Information (Signed)
Summary: rov/sp  Anticoagulant Therapy  Managed by: Minerva Areola, RN Referring MD: Dr. Dietrich Pates PCP: Dr Jackie Plum Supervising MD: Myrtis Ser MD, Tinnie Gens Indication 1: Atrial Flutter Lab Used: [facility] [place] Site: [address] INR POC 1.6 INR RANGE 2-3  Dietary changes: no    Health status changes: no    Bleeding/hemorrhagic complications: no    Recent/future hospitalizations: no    Any changes in medication regimen? no    Recent/future dental: yes     Details: 4 weeks ago; off for 3 days  Any missed doses?: no       Is patient compliant with meds? yes       Current Medications (verified): 1)  Vytorin 10-80 Mg Tabs (Ezetimibe-Simvastatin) .Marland Kitchen.. 1 By Mouth At Bedtime 2)  Androgel Pump 1 % Gel (Testosterone) .... Apply 2 Pumpfulls Daily. 3)  Micardis Hct 80-25 Mg Tabs (Telmisartan-Hctz) .Marland Kitchen.. 1 By Mouth Once Daily-Office Visit Due Now 4)  Flomax 0.4 Mg Cp24 (Tamsulosin Hcl) .Marland Kitchen.. 1 By Mouth Qd 5)  Nexium 40 Mg Cpdr (Esomeprazole Magnesium) .Marland Kitchen.. 1 By Mouth Once Daily- 6)  Flonase 50 Mcg/act Susp (Fluticasone Propionate) .... 2 Sprays Each Nostril Daily As Needed. 7)  Colchicine 0.6 Mg  Tabs (Colchicine) .Marland Kitchen.. 1 By Mouth Daily 8)  Remeron 15 Mg  Tabs (Mirtazapine) .Marland Kitchen.. 1daily At Bedtime. 9)  No More Benzos--Xanax 10)  Coumadin 10 Mg Tabs (Warfarin Sodium) .... Use As Directed By [facility] Clinic 11)  Carvedilol 12.5 Mg Tabs (Carvedilol) .... Take One Tablet By Mouth Twice A Day 12)  Indomethacin 25 Mg Caps (Indomethacin) .... Take One To Two Capsule Three Times A Day Prn  Allergies (verified): No Known Drug Allergies  Anticoagulation Management History:      The patient is taking warfarin and comes in today for a routine follow up visit.  Positive risk factors for bleeding include an age of 74 years or older and history of CVA/TIA.  The bleeding index is 'intermediate risk'.  Positive CHADS2 values include History of CHF, History of HTN, and Prior  Stroke/CVA/TIA.  Negative CHADS2 values include Age > 72 years old.  Anticoagulation responsible provider: Myrtis Ser MD, Tinnie Gens.  INR POC: 1.6.  Cuvette Lot#: [id number].  Exp: 05/2011.    Anticoagulation Management Assessment/Plan:      The patient's current anticoagulation dose is Coumadin 10 mg tabs: Use as directed by [facility].  The target INR is 2.0-3.0.  The next INR is due 03/13/2010.  Anticoagulation instructions were given to patient.  Results were reviewed/authorized by Minerva Areola, RN.  He was notified by Minerva Areola, RN.         Prior Anticoagulation Instructions: INR 3.4  Skip tomorrow's dose of Coumadin then resume same dose of 1 tablet every day except 1 1/2 tablets on Friday   Current Anticoagulation Instructions: The patient is to continue with the same dose of coumadin.  This dosage except take 2 tablets today and one and a half tablets tomorrow then resume one tablet every day except one and a half tablets on Fridays.

## 2010-11-03 NOTE — Progress Notes (Signed)
Summary: pt needs letter of clearence  done   Phone Note Call from Patient Call back at Home Phone [phone]   Caller: Patient Reason for Call: Talk to Nurse, Talk to Doctor Summary of Call: pt needs to have dental work done at Dr. Remer Macho office. The office phone number is  [phone]. He gave the release form to nurse in coumadin and he needs this letter of clearence asap so he can get the procedure scheduled. Initial call taken by: Omer Jack,  January 16, 2010 10:00 AM  Follow-up for Phone Call        spoke with pt, the dentist office hasn't received paperwork that was faxed 4/13.  will refax paperwork today. Follow-up by: Charolotte Capuchin, RN,  January 16, 2010 10:09 AM

## 2010-11-03 NOTE — Medication Information (Signed)
Summary: rov/tm  Anticoagulant Therapy  Managed by: Eda Keys, PharmD Referring MD: Dr. Dietrich Pates PCP: Dr Jackie Plum Supervising MD: Riley Kill MD, Maisie Fus Indication 1: Atrial Flutter Lab Used: [facility] [place] Site: [address] INR POC 1.7 INR RANGE 2-3  Dietary changes: no    Health status changes: no    Bleeding/hemorrhagic complications: no    Recent/future hospitalizations: no    Any changes in medication regimen? no    Recent/future dental: no  Any missed doses?: yes     Details: Pt believes he missed two doses...  Is patient compliant with meds? yes       Current Medications (verified): 1)  Vytorin 10-80 Mg Tabs (Ezetimibe-Simvastatin) .Marland Kitchen.. 1 By Mouth At Bedtime 2)  Androgel Pump 1 % Gel (Testosterone) .... Apply 2 Pumpfulls Daily. 3)  Micardis Hct 80-25 Mg Tabs (Telmisartan-Hctz) .Marland Kitchen.. 1 By Mouth Once Daily-Office Visit Due Now 4)  Flomax 0.4 Mg Cp24 (Tamsulosin Hcl) .Marland Kitchen.. 1 By Mouth Qd 5)  Nexium 40 Mg Cpdr (Esomeprazole Magnesium) .Marland Kitchen.. 1 By Mouth Once Daily- 6)  Flonase 50 Mcg/act Susp (Fluticasone Propionate) .... 2 Sprays Each Nostril Daily As Needed. 7)  Colchicine 0.6 Mg  Tabs (Colchicine) .Marland Kitchen.. 1 By Mouth Q4 Hours As Needed. 8)  Remeron 15 Mg  Tabs (Mirtazapine) .Marland Kitchen.. 1daily At Bedtime. 9)  No More Benzos--Xanax 10)  Coumadin 10 Mg Tabs (Warfarin Sodium) .... Use As Directed By [facility] Clinic 11)  Carvedilol 12.5 Mg Tabs (Carvedilol) .... Take One Tablet By Mouth Twice A Day  Allergies (verified): No Known Drug Allergies  Anticoagulation Management History:      The patient is taking warfarin and comes in today for a routine follow up visit.  Positive risk factors for bleeding include an age of 74 years or older and history of CVA/TIA.  The bleeding index is 'intermediate risk'.  Positive CHADS2 values include History of CHF, History of HTN, and Prior Stroke/CVA/TIA.  Negative CHADS2 values include Age > 74 years old.   Anticoagulation responsible provider: Riley Kill MD, Maisie Fus.  INR POC: 1.7.  Cuvette Lot#: [id number].  Exp: 01/2011.    Anticoagulation Management Assessment/Plan:      The patient's current anticoagulation dose is Coumadin 10 mg tabs: Use as directed by [facility].  The target INR is 2.0-3.0.  The next INR is due 12/09/2009.  Anticoagulation instructions were given to patient.  Results were reviewed/authorized by Eda Keys, PharmD.  He was notified by Eda Keys.         Prior Anticoagulation Instructions: INR 2.0 Continue 1 tablet everyday. Recheck in 2 weeks.   Current Anticoagulation Instructions: INR 1.7  Take 1.5 Tablets today.  Then start NEW dosing schedule of 1.5 tablets on Thursday, and 1 tablet all other days. Return to clinic in 4 weeks at which time pt is also seeing Dr. Antoine Poche.

## 2010-11-03 NOTE — Progress Notes (Signed)
Summary: needs ov  Phone Note Outgoing Call Call back at [facility] Phone [phone]   Summary of Call: Call from pharmacy requesting refill on micardis.  Last ov here was 2009.  Letter has already been mailed to pt (12/2009) to call & schedule ov.  I will ONLY refill meds after patient schedules a visit.  Please contact pt Shary Decamp  January 27, 2010 8:21 AM   Follow-up for Phone Call        Patient is coming in on 5.3.11 Follow-up by: Harold Barban,  January 27, 2010 8:51 AM

## 2010-11-03 NOTE — Medication Information (Signed)
Summary: rov/ewj  Anticoagulant Therapy  Managed by: Bethena Midget, RN, BSN Referring MD: Dr. Dietrich Pates PCP: Dr Jackie Plum Supervising MD: Excell Seltzer MD, Casimiro Needle Indication 1: Atrial Flutter Lab Used: [facility] [place] Site: [address] INR POC 2.0 INR RANGE 2-3  Dietary changes: no    Health status changes: no    Bleeding/hemorrhagic complications: no    Recent/future hospitalizations: no    Any changes in medication regimen? no    Recent/future dental: no  Any missed doses?: no       Is patient compliant with meds? yes       Allergies: No Known Drug Allergies  Anticoagulation Management History:      The patient is taking warfarin and comes in today for a routine follow up visit.  Positive risk factors for bleeding include an age of 74 years or older and history of CVA/TIA.  The bleeding index is 'intermediate risk'.  Positive CHADS2 values include History of CHF, History of HTN, and Prior Stroke/CVA/TIA.  Negative CHADS2 values include Age > 74 years old.  Anticoagulation responsible provider: Excell Seltzer MD, Casimiro Needle.  INR POC: 2.0.  Cuvette Lot#: [id number].  Exp: 01/2011.    Anticoagulation Management Assessment/Plan:      The patient's current anticoagulation dose is Coumadin 10 mg tabs: Use as directed by [facility] Clinic.  The target INR is 2.0-3.0.  The next INR is due 11/11/2009.  Anticoagulation instructions were given to patient.  Results were reviewed/authorized by Bethena Midget, RN, BSN.  He was notified by Bethena Midget, RN, BSN.         Prior Anticoagulation Instructions: INR 1.7  Take 1.5 tablets (15mg ) today then resume same dosage 10mg  daily. Recheck in 2 weeks.    Current Anticoagulation Instructions: INR 2.0 Continue 1 tablet everyday. Recheck in 2 weeks.

## 2010-11-03 NOTE — Medication Information (Signed)
Summary: rov/cb  Anticoagulant Therapy  Managed by: Cloyde Reams, RN, BSN Referring MD: Dr. Dietrich Pates PCP: Dr Jackie Plum Supervising MD: Juanda Chance MD, Buffie Herne Indication 1: Atrial Flutter Lab Used: [facility] [place] Site: [address] INR POC 1.8 INR RANGE 2-3  Dietary changes: no    Health status changes: no    Bleeding/hemorrhagic complications: no    Recent/future hospitalizations: no    Any changes in medication regimen? no    Recent/future dental: no  Any missed doses?: no       Is patient compliant with meds? yes       Allergies (verified): No Known Drug Allergies  Anticoagulation Management History:      The patient is taking warfarin and comes in today for a routine follow up visit.  Positive risk factors for bleeding include an age of 97 years or older and history of CVA/TIA.  The bleeding index is 'intermediate risk'.  Positive CHADS2 values include History of CHF, History of HTN, and Prior Stroke/CVA/TIA.  Negative CHADS2 values include Age > 82 years old.  Anticoagulation responsible provider: Juanda Chance MD, Smitty Cords.  INR POC: 1.8.  Cuvette Lot#: [id number].  Exp: 03/2011.    Anticoagulation Management Assessment/Plan:      The patient's current anticoagulation dose is Coumadin 10 mg tabs: Use as directed by [facility] Clinic.  The target INR is 2.0-3.0.  The next INR is due 02/13/2010.  Anticoagulation instructions were given to patient.  Results were reviewed/authorized by Cloyde Reams, RN, BSN.  He was notified by Cloyde Reams RN.         Prior Anticoagulation Instructions: INR 1.3. On 4/23, take 1.5 tablets for 2 days, then take 1 tablet daily except 1.5 tablets on Fridays.  Current Anticoagulation Instructions: INR 1.8  Take 2 tablets today, then resume same dosage 1 tablet daily except 1.5 tablets on Fridays.  Recheck in 2 weeks.

## 2010-11-03 NOTE — Medication Information (Signed)
Summary: rov/tm  Anticoagulant Therapy  Managed by: Weston Brass, PharmD Referring MD: Dr. Dietrich Pates PCP: Dr Jackie Plum Supervising MD: Riley Kill MD, Maisie Fus Indication 1: Atrial Flutter Lab Used: [facility] [place] Site: [address] INR POC 1.6 INR RANGE 2-3  Dietary changes: no    Health status changes: yes       Details: complaining of tingling in fingers. Instructed to contact PCP  Bleeding/hemorrhagic complications: no    Recent/future hospitalizations: no    Any changes in medication regimen? no    Recent/future dental: no  Any missed doses?: yes     Details: missed 1 dose last Saturday   Is patient compliant with meds? yes       Allergies: No Known Drug Allergies  Anticoagulation Management History:      The patient is taking warfarin and comes in today for a routine follow up visit.  Positive risk factors for bleeding include an age of 65 years or older and history of CVA/TIA.  The bleeding index is 'intermediate risk'.  Positive CHADS2 values include History of CHF, History of HTN, and Prior Stroke/CVA/TIA.  Negative CHADS2 values include Age > 78 years old.  Anticoagulation responsible provider: Riley Kill MD, Maisie Fus.  INR POC: 1.6.  Cuvette Lot#: [id number].  Exp: 06/2011.    Anticoagulation Management Assessment/Plan:      The patient's current anticoagulation dose is Coumadin 10 mg tabs: Use as directed by [facility] Clinic.  The target INR is 2.0-3.0.  The next INR is due 04/27/2010.  Anticoagulation instructions were given to patient.  Results were reviewed/authorized by Weston Brass, PharmD.  He was notified by Weston Brass PharmD.         Prior Anticoagulation Instructions: INR 1.4 Continue 10mg s everyday except 15mg s on Fridays. Recheck in one week.   Current Anticoagulation Instructions: INR 1.6  Take 2 tablets today then increase dose to 1 tablet every day except 1 1/2 tablets on Monday and Friday.

## 2010-11-03 NOTE — Medication Information (Signed)
Summary: [place] [facility]  Anticoagulant Therapy  Managed by: Bethena Midget, RN, BSN Referring MD: Dr. Dietrich Pates PCP: Dr Jackie Plum Supervising MD: Graciela Husbands MD, Viviann Spare Indication 1: Atrial Flutter Lab Used: [facility] [place] Site: [address] INR POC 3.3 INR RANGE 2-3  Dietary changes: no    Health status changes: no    Bleeding/hemorrhagic complications: no    Recent/future hospitalizations: no    Any changes in medication regimen? yes       Details: Colchicine 0.6mg  daily started 12/23/09  Recent/future dental: no  Any missed doses?: yes     Details: missed a dose last week.   Is patient compliant with meds? yes      Comments: Pending extractions- letter given to Dr. Jenene Slicker nurse for clearance.  Current Medications (verified): 1)  Vytorin 10-80 Mg Tabs (Ezetimibe-Simvastatin) .Marland Kitchen.. 1 By Mouth At Bedtime 2)  Androgel Pump 1 % Gel (Testosterone) .... Apply 2 Pumpfulls Daily. 3)  Micardis Hct 80-25 Mg Tabs (Telmisartan-Hctz) .Marland Kitchen.. 1 By Mouth Once Daily-Office Visit Due Now 4)  Flomax 0.4 Mg Cp24 (Tamsulosin Hcl) .Marland Kitchen.. 1 By Mouth Qd 5)  Nexium 40 Mg Cpdr (Esomeprazole Magnesium) .Marland Kitchen.. 1 By Mouth Once Daily- 6)  Flonase 50 Mcg/act Susp (Fluticasone Propionate) .... 2 Sprays Each Nostril Daily As Needed. 7)  Colchicine 0.6 Mg  Tabs (Colchicine) .Marland Kitchen.. 1 By Mouth Daily 8)  Remeron 15 Mg  Tabs (Mirtazapine) .Marland Kitchen.. 1daily At Bedtime. 9)  No More Benzos--Xanax 10)  Coumadin 10 Mg Tabs (Warfarin Sodium) .... Use As Directed By [facility] Clinic 11)  Carvedilol 12.5 Mg Tabs (Carvedilol) .... Take One Tablet By Mouth Twice A Day 12)  Indomethacin 25 Mg Caps (Indomethacin) .... Take One To Two Capsule Three Times A Day Prn  Allergies: No Known Drug Allergies  Anticoagulation Management History:      The patient is taking warfarin and comes in today for a routine follow up visit.  Positive risk factors for bleeding include an age of 74 years or older and history of CVA/TIA.   The bleeding index is 'intermediate risk'.  Positive CHADS2 values include History of CHF, History of HTN, and Prior Stroke/CVA/TIA.  Negative CHADS2 values include Age > 48 years old.  Anticoagulation responsible provider: Graciela Husbands MD, Viviann Spare.  INR POC: 3.3.  Cuvette Lot#: [id number].  Exp: 02/2011.    Anticoagulation Management Assessment/Plan:      The patient's current anticoagulation dose is Coumadin 10 mg tabs: Use as directed by [facility] Clinic.  The target INR is 2.0-3.0.  The next INR is due 01/20/2010.  Anticoagulation instructions were given to patient.  Results were reviewed/authorized by Bethena Midget, RN, BSN.  He was notified by Bethena Midget, RN, BSN.         Prior Anticoagulation Instructions: INR 2.0  Start taking 1 tablet daily except 1.5 tablets on Tuesdays.  Recheck in 4 weeks.    Current Anticoagulation Instructions: INR 3.3 Skip Wednesdays dose then resume 10mg s daily except 15mg s on Tuesdays. Recheck in 2 weeks.

## 2010-11-03 NOTE — Medication Information (Signed)
Summary: rov/ewj  Anticoagulant Therapy  Managed by: Weston Brass, PharmD Referring MD: Dr. Dietrich Pates PCP: Dr Jackie Plum Supervising MD: Clifton James MD, Cristal Deer Indication 1: Atrial Flutter Lab Used: [facility] [place] Site: [address] INR POC 3.4 INR RANGE 2-3  Dietary changes: yes       Details: had some teeth pulled and was not able to eat his normal diet   Health status changes: no    Bleeding/hemorrhagic complications: no    Recent/future hospitalizations: no    Any changes in medication regimen? no    Recent/future dental: no  Any missed doses?: no       Is patient compliant with meds? yes       Allergies: No Known Drug Allergies  Anticoagulation Management History:      The patient is taking warfarin and comes in today for a routine follow up visit.  Positive risk factors for bleeding include an age of 14 years or older and history of CVA/TIA.  The bleeding index is 'intermediate risk'.  Positive CHADS2 values include History of CHF, History of HTN, and Prior Stroke/CVA/TIA.  Negative CHADS2 values include Age > 34 years old.  Anticoagulation responsible provider: Clifton James MD, Cristal Deer.  INR POC: 3.4.  Cuvette Lot#: [id number].  Exp: 05/2011.    Anticoagulation Management Assessment/Plan:      The patient's current anticoagulation dose is Coumadin 10 mg tabs: Use as directed by [facility].  The target INR is 2.0-3.0.  The next INR is due 02/26/2010.  Anticoagulation instructions were given to patient.  Results were reviewed/authorized by Weston Brass, PharmD.  He was notified by Weston Brass PharmD.         Prior Anticoagulation Instructions: INR 1.8  Take 2 tablets today, then resume same dosage 1 tablet daily except 1.5 tablets on Fridays.  Recheck in 2 weeks.    Current Anticoagulation Instructions: INR 3.4  Skip tomorrow's dose of Coumadin then resume same dose of 1 tablet every day except 1 1/2 tablets on Friday

## 2010-11-03 NOTE — Medication Information (Signed)
Summary: rov/mlw  Anticoagulant Therapy  Managed by: Cloyde Reams, RN, BSN Referring MD: Dr. Dietrich Pates PCP: Dr Jackie Plum Supervising MD: Tenny Craw MD, Gunnar Fusi Indication 1: Atrial Flutter Lab Used: [facility] [place] Site: [address] INR POC 1.7 INR RANGE 2-3  Dietary changes: no    Health status changes: no    Bleeding/hemorrhagic complications: no    Recent/future hospitalizations: no    Any changes in medication regimen? no    Recent/future dental: no  Any missed doses?: yes     Details: May have missed 1 dose last week??  Is patient compliant with meds? yes       Allergies (verified): No Known Drug Allergies  Anticoagulation Management History:      The patient is taking warfarin and comes in today for a routine follow up visit.  Positive risk factors for bleeding include an age of 74 years or older and history of CVA/TIA.  The bleeding index is 'intermediate risk'.  Positive CHADS2 values include History of CHF, History of HTN, and Prior Stroke/CVA/TIA.  Negative CHADS2 values include Age > 29 years old.  Anticoagulation responsible provider: Tenny Craw MD, Gunnar Fusi.  INR POC: 1.7.  Cuvette Lot#: [id number].  Exp: 11/2010.    Anticoagulation Management Assessment/Plan:      The patient's current anticoagulation dose is Coumadin 10 mg tabs: Use as directed by [facility] Clinic.  The target INR is 2.0-3.0.  The next INR is due 10/28/2009.  Anticoagulation instructions were given to patient.  Results were reviewed/authorized by Cloyde Reams, RN, BSN.  He was notified by Cloyde Reams RN.         Prior Anticoagulation Instructions: INR 1.7 Change dose to 1 pill everyday. Recheck INR in 2 weeks.   Current Anticoagulation Instructions: INR 1.7  Take 1.5 tablets (15mg ) today then resume same dosage 10mg  daily. Recheck in 2 weeks.

## 2010-11-03 NOTE — Medication Information (Signed)
Summary: rov/sp  Anticoagulant Therapy  Managed by: Weston Brass, PharmD Referring MD: Dr. Dietrich Pates PCP: Dr Jackie Plum Supervising MD: Riley Kill MD, Maisie Fus Indication 1: Atrial Flutter Lab Used: [facility] [place] Site: [address] INR POC 4.8 INR RANGE 2-3  Dietary changes: yes       Details: less vitamin K intake  Health status changes: no    Bleeding/hemorrhagic complications: no    Recent/future hospitalizations: no    Any changes in medication regimen? no    Recent/future dental: no  Any missed doses?: no       Is patient compliant with meds? yes       Allergies: No Known Drug Allergies  Anticoagulation Management History:      The patient is taking warfarin and comes in today for a routine follow up visit.  Positive risk factors for bleeding include an age of 34 years or older and history of CVA/TIA.  The bleeding index is 'intermediate risk'.  Positive CHADS2 values include History of CHF, History of HTN, and Prior Stroke/CVA/TIA.  Negative CHADS2 values include Age > 43 years old.  Anticoagulation responsible provider: Riley Kill MD, Maisie Fus.  INR POC: 4.8.  Cuvette Lot#: [id number].  Exp: 07/2011.    Anticoagulation Management Assessment/Plan:      The patient's current anticoagulation dose is Coumadin 10 mg tabs: Use as directed by [facility].  The target INR is 2.0-3.0.  The next INR is due 05/04/2010.  Anticoagulation instructions were given to patient.  Results were reviewed/authorized by Weston Brass, PharmD.  He was notified by Weston Brass PharmD.         Prior Anticoagulation Instructions: INR 1.6  Take 2 tablets today then increase dose to 1 tablet every day except 1 1/2 tablets on Monday and Friday.   Current Anticoagulation Instructions: INR 4.8  Skip Tuesday and Wednesday's dose of Coumadin then decrease dose to 1 tablet every day except 1 1/2 tablets on Friday.

## 2010-11-03 NOTE — Medication Information (Signed)
Summary: rov/jk  Anticoagulant Therapy  Managed by: Eda Keys, PharmD Referring MD: Dr. Dietrich Pates PCP: Dr Jackie Plum Supervising MD: Tenny Craw MD, Gunnar Fusi Indication 1: Atrial Flutter Lab Used: [facility] [place] Site: [address] INR POC 2.6 INR RANGE 2-3  Dietary changes: no    Health status changes: no    Bleeding/hemorrhagic complications: no    Recent/future hospitalizations: no    Any changes in medication regimen? no    Recent/future dental: no  Any missed doses?: no       Is patient compliant with meds? yes       Allergies: No Known Drug Allergies  Anticoagulation Management History:      The patient is taking warfarin and comes in today for a routine follow up visit.  Positive risk factors for bleeding include an age of 74 years or older and history of CVA/TIA.  The bleeding index is 'intermediate risk'.  Positive CHADS2 values include History of CHF, History of HTN, and Prior Stroke/CVA/TIA.  Negative CHADS2 values include Age > 74 years old.  Anticoagulation responsible Cadyn Fann: Tenny Craw MD, Gunnar Fusi.  INR POC: 2.6.  Cuvette Lot#: [id number].  Exp: 08/2011.    Anticoagulation Management Assessment/Plan:      The patient's current anticoagulation dose is Coumadin 10 mg tabs: Use as directed by [facility] Clinic.  The target INR is 2.0-3.0.  The next INR is due 07/03/2010.  Anticoagulation instructions were given to patient.  Results were reviewed/authorized by Eda Keys, PharmD.  He was notified by Harrel Carina, PharmD candidate.         Prior Anticoagulation Instructions: INR 3.6  Today, August 26th, skip your dose.  Then being new Coumadin schedule of taking 1 tablet (10mg ) every day.  Recheck in 2 weeks.   Current Anticoagulation Instructions: INR 2.6  Continue taking 1 tablet everyday. Re-check INR in 3 weeks.

## 2010-11-03 NOTE — Medication Information (Signed)
Summary: ccr/jss  Anticoagulant Therapy  Managed by: Bethena Midget, RN, BSN Referring MD: Dr. Dietrich Pates PCP: Dr Jackie Plum Supervising MD: Antoine Poche MD, Fayrene Fearing Indication 1: Atrial Flutter Lab Used: [facility] [place] Site: [address] INR POC 1.4 INR RANGE 2-3  Dietary changes: no    Health status changes: yes       Details: Gout Attack on 04/03/10   Bleeding/hemorrhagic complications: no    Recent/future hospitalizations: no    Any changes in medication regimen? yes       Details: Was given Colcrys 0.6mg s 1 tab Q12hrs. started on 04/03/10  Recent/future dental: no  Any missed doses?: yes     Details: He states he missed some dose 1 or 2.   Is patient compliant with meds? yes      Comments: Also elevated PSA- Rx Septra DS will start this medication today for 30 days.   Allergies: No Known Drug Allergies  Anticoagulation Management History:      The patient is taking warfarin and comes in today for a routine follow up visit.  Positive risk factors for bleeding include an age of 2 years or older and history of CVA/TIA.  The bleeding index is 'intermediate risk'.  Positive CHADS2 values include History of CHF, History of HTN, and Prior Stroke/CVA/TIA.  Negative CHADS2 values include Age > 31 years old.  Anticoagulation responsible provider: Antoine Poche MD, Fayrene Fearing.  INR POC: 1.4.  Cuvette Lot#: [id number].  Exp: 06/2011.    Anticoagulation Management Assessment/Plan:      The patient's current anticoagulation dose is Coumadin 10 mg tabs: Use as directed by [facility].  The target INR is 2.0-3.0.  The next INR is due 04/17/2010.  Anticoagulation instructions were given to patient.  Results were reviewed/authorized by Bethena Midget, RN, BSN.  He was notified by Bethena Midget, RN, BSN.         Prior Anticoagulation Instructions: INR 2.4  Continue same dose of 1 tablet every day except 1 1/2 tablets on Friday  Current Anticoagulation Instructions: INR  1.4 Continue 10mg s everyday except 15mg s on Fridays. Recheck in one week.

## 2010-11-03 NOTE — Medication Information (Signed)
Summary: rov   js  Anticoagulant Therapy  Managed by: Weston Brass, PharmD Referring MD: Dr. Dietrich Pates PCP: Dr Jackie Plum Supervising MD: Clifton James MD, Cristal Deer Indication 1: Atrial Flutter Lab Used: [facility] [place] Site: [address] INR POC 2.4 INR RANGE 2-3  Dietary changes: no    Health status changes: no    Bleeding/hemorrhagic complications: no    Recent/future hospitalizations: no    Any changes in medication regimen? no    Recent/future dental: no  Any missed doses?: no       Is patient compliant with meds? yes       Allergies: No Known Drug Allergies  Anticoagulation Management History:      The patient is taking warfarin and comes in today for a routine follow up visit.  Positive risk factors for bleeding include an age of 35 years or older and history of CVA/TIA.  The bleeding index is 'intermediate risk'.  Positive CHADS2 values include History of CHF, History of HTN, and Prior Stroke/CVA/TIA.  Negative CHADS2 values include Age > 60 years old.  Anticoagulation responsible provider: Clifton James MD, Cristal Deer.  INR POC: 2.4.  Cuvette Lot#: [id number].  Exp: 05/2011.    Anticoagulation Management Assessment/Plan:      The patient's current anticoagulation dose is Coumadin 10 mg tabs: Use as directed by [facility].  The target INR is 2.0-3.0.  The next INR is due 04/03/2010.  Anticoagulation instructions were given to patient.  Results were reviewed/authorized by Weston Brass, PharmD.  He was notified by Weston Brass PharmD.         Prior Anticoagulation Instructions: The patient is to continue with the same dose of coumadin.  This dosage except take 2 tablets today and one and a half tablets tomorrow then resume one tablet every day except one and a half tablets on Fridays.  Current Anticoagulation Instructions: INR 2.4  Continue same dose of 1 tablet every day except 1 1/2 tablets on Friday

## 2010-11-05 NOTE — Medication Information (Signed)
Summary: [facility] Clinic  Anticoagulant Therapy  Managed by: Inactive Referring MD: Dr. Dietrich Pates PCP: Dr Jackie Plum Supervising MD: Antoine Poche MD, Fayrene Fearing Indication 1: Atrial Flutter Lab Used: [facility] [place] Site: [address] INR RANGE 2-3          Comments: Pt off coumadin  Allergies: No Known Drug Allergies  Anticoagulation Management History:      Positive risk factors for bleeding include an age of 74 years or older and history of CVA/TIA.  The bleeding index is 'intermediate risk'.  Positive CHADS2 values include History of CHF, History of HTN, and Prior Stroke/CVA/TIA.  Negative CHADS2 values include Age > 81 years old.  Anticoagulation responsible Austin Sawyer: Antoine Poche MD, Fayrene Fearing.  Exp: 08/2011.    Anticoagulation Management Assessment/Plan:      The target INR is 2.0-3.0.  The next INR is due 07/03/2010.  Anticoagulation instructions were given to patient.  Results were reviewed/authorized by Inactive.         Prior Anticoagulation Instructions: INR 2.6  Continue taking 1 tablet everyday. Re-check INR in 3 weeks.

## 2010-12-21 ENCOUNTER — Telehealth: Payer: Self-pay | Admitting: Cardiology

## 2010-12-31 NOTE — Progress Notes (Signed)
Summary: med delivery request  Medications Added CARVEDILOL 12.5 MG TABS (CARVEDILOL) Take one tablet by mouth twice a day       Phone Note Refill Request Message from:  Pharmacy on December 21, 2010 11:14 AM  pt needs home delivery for carvedilol 12.5mg  90 day supply with refills/[phone] ref [id number]   Method Requested: Telephone to Pharmacy Initial call taken by: Glynda Jaeger,  December 21, 2010 11:16 AM [facility]   Follow-up for Phone Call        Spoke with pharmacist. Rx resent.    New/Updated Medications: CARVEDILOL 12.5 MG TABS (CARVEDILOL) Take one tablet by mouth twice a day Prescriptions: CARVEDILOL 12.5 MG TABS (CARVEDILOL) Take one tablet by mouth twice a day  #180 x 3   Entered by:   Marrion Coy, CNA   Authorized by:   Rollene Rotunda, MD, [facility]   Signed by:   Marrion Coy, CNA on 12/21/2010   Method used:   Faxed to ...       [facility] (mail-order)             , [place]         Ph:        Fax: [phone]   RxID:   [id number]

## 2011-01-06 LAB — PROTIME-INR
INR: 1.07 (ref 0.00–1.49)
INR: 1.11 (ref 0.00–1.49)
INR: 1.13 (ref 0.00–1.49)
INR: 1.21 (ref 0.00–1.49)
INR: 1.47 (ref 0.00–1.49)
Prothrombin Time: 13.7 seconds (ref 11.6–15.2)
Prothrombin Time: 13.8 seconds (ref 11.6–15.2)
Prothrombin Time: 14.2 seconds (ref 11.6–15.2)

## 2011-01-06 LAB — HEMOGLOBIN A1C
Hgb A1c MFr Bld: 5.8 % (ref 4.6–6.1)
Mean Plasma Glucose: 120 mg/dL

## 2011-01-06 LAB — POCT CARDIAC MARKERS
CKMB, poc: 2.5 ng/mL (ref 1.0–8.0)
Myoglobin, poc: 274 ng/mL (ref 12–200)
Troponin i, poc: <0.05 ng/mL (ref 0.00–0.09)
Troponin i, poc: <0.05 ng/mL (ref 0.00–0.09)

## 2011-01-06 LAB — BASIC METABOLIC PANEL
BUN: 17 mg/dL (ref 6–23)
BUN: 21 mg/dL (ref 6–23)
Calcium: 8.7 mg/dL (ref 8.4–10.5)
Chloride: 106 mEq/L (ref 96–112)
Chloride: 101 mEq/L (ref 96–112)
Creatinine, Ser: 1.2 mg/dL (ref 0.4–1.5)
Creatinine, Ser: 1.31 mg/dL (ref 0.4–1.5)
GFR calc Af Amer: >60 mL/min (ref >60)
GFR calc non Af Amer: 54 mL/min — ABNORMAL LOW (ref >60)
GFR calc non Af Amer: 60 mL/min — ABNORMAL LOW (ref >60)
Glucose, Bld: 122 mg/dL — ABNORMAL HIGH (ref 70–99)
Glucose, Bld: 115 mg/dL — ABNORMAL HIGH (ref 70–99)
Potassium: 3.8 mEq/L (ref 3.5–5.1)
Potassium: 3.2 mEq/L — ABNORMAL LOW (ref 3.5–5.1)
Sodium: 142 mEq/L (ref 135–145)

## 2011-01-06 LAB — CARDIAC PANEL(CRET KIN+CKTOT+MB+TROPI)
CK, MB: 4.2 ng/mL — ABNORMAL HIGH (ref 0.3–4.0)
Relative Index: 0.9 (ref 0.0–2.5)
Relative Index: 1 (ref 0.0–2.5)
Total CK: 459 U/L — ABNORMAL HIGH (ref 7–232)
Troponin I: 0.02 ng/mL (ref 0.00–0.06)

## 2011-01-06 LAB — CBC
HCT: 37 % — ABNORMAL LOW (ref 39.0–52.0)
HCT: 37.9 % — ABNORMAL LOW (ref 39.0–52.0)
HCT: 38 % — ABNORMAL LOW (ref 39.0–52.0)
HCT: 39 % (ref 39.0–52.0)
HCT: 39 % (ref 39.0–52.0)
HCT: 42.5 % (ref 39.0–52.0)
HCT: 44 % (ref 39.0–52.0)
Hemoglobin: 12.5 g/dL — ABNORMAL LOW (ref 13.0–17.0)
Hemoglobin: 12.5 g/dL — ABNORMAL LOW (ref 13.0–17.0)
Hemoglobin: 12.7 g/dL — ABNORMAL LOW (ref 13.0–17.0)
Hemoglobin: 14.3 g/dL (ref 13.0–17.0)
Hemoglobin: 14.5 g/dL (ref 13.0–17.0)
MCHC: 33.1 g/dL (ref 30.0–36.0)
MCHC: 33.5 g/dL (ref 30.0–36.0)
MCHC: 33.5 g/dL (ref 30.0–36.0)
MCHC: 33.8 g/dL (ref 30.0–36.0)
MCV: 88.1 fL (ref 78.0–100.0)
MCV: 88.2 fL (ref 78.0–100.0)
MCV: 88.2 fL (ref 78.0–100.0)
MCV: 88.4 fL (ref 78.0–100.0)
MCV: 87.3 fL (ref 78.0–100.0)
Platelets: 164 10*3/uL (ref 150–400)
Platelets: 168 10*3/uL (ref 150–400)
Platelets: 171 10*3/uL (ref 150–400)
RBC: 4.82 MIL/uL (ref 4.22–5.81)
RDW: 13.1 % (ref 11.5–15.5)
RDW: 13.2 % (ref 11.5–15.5)
RDW: 13.3 % (ref 11.5–15.5)
RDW: 13.5 % (ref 11.5–15.5)
RDW: 13.7 % (ref 11.5–15.5)
RDW: 12.5 % (ref 11.5–15.5)
WBC: 8.1 10*3/uL (ref 4.0–10.5)
WBC: 9.6 10*3/uL (ref 4.0–10.5)
WBC: 11.5 10*3/uL — ABNORMAL HIGH (ref 4.0–10.5)

## 2011-01-06 LAB — DIFFERENTIAL
Eosinophils Absolute: 0.5 10*3/uL (ref 0.0–0.7)
Eosinophils Relative: 5 % (ref 0–5)
Lymphs Abs: 1.3 10*3/uL (ref 0.7–4.0)
Monocytes Absolute: 0.7 10*3/uL (ref 0.1–1.0)

## 2011-01-06 LAB — LIPID PANEL
Cholesterol: 129 mg/dL (ref 0–200)
HDL: 36 mg/dL — ABNORMAL LOW (ref >39)
Triglycerides: 73 mg/dL (ref <150)

## 2011-01-06 LAB — APTT: aPTT: 29 seconds (ref 24–37)

## 2011-01-06 LAB — HEPARIN LEVEL (UNFRACTIONATED)
Heparin Unfractionated: 0.7 IU/mL (ref 0.30–0.70)
Heparin Unfractionated: 0.79 IU/mL — ABNORMAL HIGH (ref 0.30–0.70)

## 2011-01-06 LAB — POCT B-TYPE NATRIURETIC PEPTIDE (BNP): B Natriuretic Peptide, POC: 12 pg/mL (ref 0–100)

## 2011-02-16 NOTE — H&P (Signed)
NAMEALEXA, GOLEBIEWSKI NO.:  [id number]   MEDICAL RECORD NO.:  [id number]          PATIENT TYPE:  IPS   LOCATION:  0306                          FACILITY:  [facility]   PHYSICIAN:  Anselm Jungling, MD  DATE OF BIRTH:  10/08/1934   DATE OF ADMISSION:  05/10/2007  DATE OF DISCHARGE:                       PSYCHIATRIC ADMISSION ASSESSMENT   IDENTIFYING INFORMATION:  This is a 76 year old African American male  who is married.  This is a voluntary admission.   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  He had been drinking some alcohol and felt that there was  no reason to live, although he did not have a specific plan for suicide.  Alcohol level was 58 in the emergency room.  He said that he had  relapsed on alcohol and had had his first drink this past Tuesday,  August 5th, after more than 25 years of complete abstinence.  Drank  several drinks and was intoxicated.  He cites the reason for his relapse  being a lot of guilt over recent conflict in his marriage.  The patient  has a daughter by a previous relationship and the daughter and his wife  do not speak to each other and the conflict has been upsetting him after  all these years.  He denies any suicidal or homicidal thought.  Denies  any other substance abuse.  He had seen his primary care physician about  these issues and had been taking some Xanax, 0.5 mg up to t.i.d., for  the past 6 months to help deal with his anxiety and his worries over  this issue.  In fact, he reports he was only actually taking 0.5 at  bedtime.  No auditory hallucinations.  He denies any prior history of  depression or suicidal thought.   PAST PSYCHIATRIC HISTORY:  The patient's first admission to [facility].  He does report prior admissions many years  ago for treatment of alcohol abuse when he was  a heavy drinker in the  60s and 57s.  He says that he completely stopped alcohol in 1980 and has  been abstinent with reinforcement from his church community of [address]  Witnesses.  Denies any mental defect, no history of brain injury, no  history of dangerous ideas, no history of psychosis.   PAST PSYCHIATRIC HISTORY:  The patient was born in [place],  raised in [place], [place] in the 50s and 60s.  Has been in [place] now for the past 8 years.  Currently married 48 years with a  satisfactory marriage with occasional intermittent difficulties.  States  that he is satisfied with the marriage and wife is supportive of him  becoming abstinent again.  He is retired Publishing copy.  He  has three living children and one child deceased due to heart disease.  No current legal problems.  The patient has no other history of  substance abuse.   FAMILY HISTORY:  Is remarkable for mother and father both with  alcoholism, one daughter with depression, and a sister also with  depression and alcoholism.   MEDICAL HISTORY:  The patient is followed by Dr. Levell July, M.D., at  [facility].   MEDICAL PROBLEMS:  Hypertension and dyslipidemia.   CURRENT MEDICATIONS:  1. Vytorin 10/80 milligrams p.o. nightly  2. Micardis HCT 80/25 mg p.o. daily.  3. Flomax 0.4 mg p.o. daily.  4. Xanax 0.5 mg p.o. t.i.d. p.r.n. for anxiety.  5. Nexium 40 mg daily.  6. Atenolol 50 mg daily.  7. Cardura 2 mg p.o. daily.   DRUG ALLERGIES:  No known drug allergies.   POSITIVE PHYSICAL FINDINGS:  Full physical exam was done in the  emergency room along with the review of systems.  It is noted in the  record.  Five feet 8 inches tall, 210 pounds, temp 98.6, pulse 59,  respirations 20, blood pressure 149/99.   DIAGNOSTIC STUDIES:  Alcohol level 58.  CBC is within normal limits,  platelets 219,000.  Chemistry is all within normal limits.  Urine drug  screen positive for benzodiazepines.   MENTAL  STATUS EXAM:  Fully alert male, pleasant, cooperative, bright  affect, appropriate.  Has a sense of humor.  Hygiene is excellent.  His  mood is euthymic.  Thought process logical and coherent, pleasant  cooperative.  Insight is good.  No suicidal thoughts today.  Denying all  dangerous ideations.  No flight of ideas.  Thoughts are well formed,  goal directed.  No evidence of psychosis.  Cognition completely intact.  He has expressed some concerns about recent lack of concentration,  difficulty with remembering minor details.  He is oriented x3 here  though and shows no deficits in immediate short or long-term memory.  Judgment and insight is within normal limits.  Concentration is within  normal limits.   AXIS I:  EtOH dependence with relapse, rule out mood disorder, not  otherwise specified, and rule out early dementia.  AXIS II:  No diagnosis.  AXIS III:  Hypertension, dyslipidemia, rule out early dementia.  AXIS IV:  Moderate domestic stress.  AXIS V:  Current 45, past year 36.   PLAN:  Is to voluntarily admit the patient with a goal of a safe detox  within 5 days and to alleviate any suicidal thoughts.  We started him on  a Librium protocol and have explained that to him.  That will include  Librium, 25 mg q.i.d., today and he will get a CIWA  measurement of his  withdrawal symptoms every 6 hours.  Will discontinue the Xanax and,  since he has reported some memory deficits, we will check a TSH on him,  a hepatic function panel, RPR and B12 level.  Estimated length of stay  is 5-7 days.      Margaret A. Lorin Picket, N.P.      Anselm Jungling, MD  Electronically Signed    MAS/MEDQ  D:  05/11/2007  T:  05/11/2007  Job:  [id number]

## 2011-02-16 NOTE — Assessment & Plan Note (Signed)
[facility]                         GASTROENTEROLOGY OFFICE NOTE   Austin Sawyer, Austin Sawyer                          MRN:          [id number]  DATE:09/13/2007                            DOB:          1934/10/11    REASON FOR CONSULTATION:  History of Barrett's esophagus.   Austin Sawyer is a pleasant 76 year old African American male referred  through the courtesy of Dr. Drue Sawyer for evaluation.  He has Barrett's  esophagus, was apparently diagnosed in 2002.  Unbeknownst to Dr. Drue Sawyer, he  had an endoscopy several months ago at [facility] in [facility], results are not known.  Austin Sawyer has no GI complaints including  pyrosis, dysphagia, abdominal pain or change in bowel habits.  He  apparently underwent a colonoscopy at the [place] in 2007 though these  results are also not known.   PAST MEDICAL HISTORY:  1. Hypertension.  2. Angina.  3. Arthritis.  4. Depression.  5. Sleep apnea.  6. Status post herniorrhaphy and angioplasty with stent.   FAMILY HISTORY:  Noncontributory.   MEDICATIONS:  1. Simvastatin.  2. Testosterone.  3. Micardis.  4. Flomax.  5. Nexium.  6. Atenolol.  7. Cardura.  8. Alprazolam.  9. Colchicine.   HE HAS NO ALLERGIES.  He does not smoke.  He is a former alcoholic but  he stopped several years ago.  He is married and retired.   REVIEW OF SYSTEMS:  Positive for joint pains, excessive urination, loss  of hearing and recent depression.   PHYSICAL EXAMINATION:  Pulse 64, blood pressure 124/68, weight 224.  HEENT: EOMI.  PERRLA.  Sclerae are anicteric.  Conjunctivae are pink.  NECK:  Supple without thyromegaly, adenopathy or carotid bruits.  CHEST:  Clear to auscultation and percussion without adventitious  sounds.  CARDIAC:  Regular rhythm; normal S1 S2.  There are no murmurs, gallops  or rubs.  ABDOMEN:  Bowel sounds are normoactive.  Abdomen is soft, nontender and  nondistended.  There are no abdominal masses,  tenderness, splenic  enlargement or hepatomegaly.  EXTREMITIES:  Full range of motion.  No cyanosis, clubbing or edema.  RECTAL:  Deferred.   IMPRESSION:  1. History of Barrett's esophagus.  2. Coronary artery disease and hypertension -- Stable.   RECOMMENDATION:  I will review his recent endoscopy result.  Provided no  dysplasia was seen then I would repeat his endoscopy in 2-3 years.     Austin Sawyer. Austin Dice, MD,FACG  Electronically Signed    RDK/MedQ  DD: 09/13/2007  DT: 09/13/2007  Job #: [id number]   cc:   Austin Ora, MD

## 2011-02-16 NOTE — H&P (Signed)
Austin Sawyer, Austin Sawyer                 ACCOUNT NO.:  [id number]   MEDICAL RECORD NO.:  [id number]          PATIENT TYPE:  IPS   LOCATION:  0502                          FACILITY:  [facility]   PHYSICIAN:  Geoffery Lyons, M.D.      DATE OF BIRTH:  [birth year]   DATE OF ADMISSION:  11/13/2007  DATE OF DISCHARGE:  11/13/2007                       PSYCHIATRIC ADMISSION ASSESSMENT   TIME OF ASSESSMENT:  11:50 a.m.   IDENTIFYING INFORMATION:  This is a 76 year old African-American male  who is married.  This is a voluntary admission.   HISTORY OF PRESENT ILLNESS:  This patient presented requesting detox off  of Xanax which he has taken for about 5-6 months..  He was previously  detoxed in August of 2008 here at [facility] after relapsing on alcohol with  heavy use after 25 years of abstinence from alcohol.  He has since been  attending AA meetings successfully but feels that use of the Xanax is  risking his sobriety.  He attempted to cut down to a half dose about 2  months ago, but became anxious, a little shaky and felt that his  thinking got confused, so went back up to his usual dose.  He is  requesting help getting off of it.  He is denying any active suicidal  thoughts.  Has endorsed some depressed mood, getting a little shaky,  sweaty and confused when he tries to cut the Xanax.  The patient has  been abstinent from alcohol since he was detoxed her in August.   PAST PSYCHIATRIC HISTORY:  Second [facility] admission with his last one being  in August 2008 after relapsing on alcohol.  No known attempts at  suicide.   SOCIAL HISTORY:  He is a retired Production manager, married  for 48 years and living with his wife at home who is supportive.  He  reports being financially stable, in a stable marriage, and denies  psychosocial stressors.   FAMILY HISTORY:  Remarkable for father with polysubstance abuse.   MEDICAL HISTORY:  The patient is followed by Dr. Drue Novel, his primary care  physician.  Medical  problems include:  1. Obstructive sleep apnea for which he uses a CPAP with oxygen.  2. Hypertension.  3. GERD.   CURRENT MEDICATIONS:  1. Micardis 80/25 mg.  2. HCTZ 1 tablet daily.  3. Atenolol 50 mg daily.  4. Flomax 0.4 mg daily.  5. Nexium 40 mg daily.  6. His Xanax that he has been taking is 0.5 mg four times a day.   DRUG ALLERGIES:  None.   PHYSICAL EXAMINATION:  GENERAL:  Physical exam is noted in the record.  This is an short-statured, pleasant, overweight gentleman in no acute  distress with a stable gait.  He does not appear in any acute withdrawal  at this time.  VITAL SIGNS:  He is 5 feet 7 inches tall, 213 pounds.  Temperature 96.1,  pulse 43, respirations 18, blood pressure 118/70.   REVIEW OF SYSTEMS AND PHYSICAL EXAMINATION:  Noted in the record.   DIAGNOSTIC STUDIES:  Chemistry - sodium 138, potassium 3.7,  chloride  101, carbon dioxide 30, BUN 14, creatinine 1.3.  Random glucose 104.  Calcium normal at 9.1.  His urine drug screen was positive for  benzodiazepines and negative for all other substances.  Alcohol level  less than 5.   PAST PSYCHIATRIC HISTORY:  Please note the patient has been followed at  the [facility] for outpatient care and most recently has been to see  Dr. Archer Asa for one visit.   MENTAL STATUS EXAM:  Fully alert male, pleasant, cooperative, with a  mildly anxious affect.  Vital signs are normal.  Speech normal in pace,  tone and production.  Mood is neutral.  Thought process logical,  coherent.  No suicidal or homicidal thoughts.  Insight is adequate.  It  is clear he has some concerns about maintaining abstinence from the  alcohol.  Would like to get off the Xanax.  Has had a lot of problems  with anxiety with it when he stops it and requesting help.  No dangerous  ideations.   ASSESSMENT:  AXIS I:  Benzodiazepine dependence.  Alcohol abuse, in  partial remission.  AXIS II:  Deferred.  AXIS III:  Hypertension,  obstructive sleep apnea and gastroesophageal  reflux disease.  AXIS IV:  Deferred.  AXIS V:  Current 53; past year 78.   PLAN:  The plan is to voluntarily admit the patient with q. 15-minute  checks in place with a goal of a safe detox within 5 days.  We started  him on a Librium protocol, kept him on his usual medications, and using  a Librium protocol to detox him off the Xanax.  Estimated length of stay  is 5 days.      Margaret A. Scott, N.P.      Geoffery Lyons, M.D.  Electronically Signed    MAS/MEDQ  D:  11/15/2007  T:  11/16/2007  Job:  [id number]

## 2011-02-16 NOTE — Letter (Signed)
September 13, 2007    [name], MD  [phone] [place], [place] [address]   RE:  [name], [name]  MRN:  [id number]  /  DOB:  [birth year]   Dear Dr. Drue Novel:   Upon your kind referral, I had the pleasure of evaluating your patient  and I am pleased to offer my findings.  I saw Austin Sawyer in the office  today.  Enclosed is a copy of my progress note that details my findings  and recommendations.   Thank you for the opportunity to participate in your patient's care.    Sincerely,      Barbette Hair. Arlyce Dice, MD,FACG  Electronically Signed    RDK/MedQ  DD: 09/13/2007  DT: 09/13/2007  Job #: [id number]
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
## 2011-02-16 NOTE — Discharge Summary (Signed)
NAMEKHALI, ALBANESE                 ACCOUNT NO.:  [id number]   MEDICAL RECORD NO.:  [id number]          PATIENT TYPE:  INP   LOCATION:  1438                         FACILITY:  [facility]   PHYSICIAN:  Rosalyn Gess. Norins, MD  DATE OF BIRTH:  Jun 09, 1935   DATE OF ADMISSION:  12/05/2007  DATE OF DISCHARGE:  12/07/2007                               DISCHARGE SUMMARY   ADMITTING DIAGNOSES:  1. Abdominal pain.  2. Anxiety.  3. Weight loss 30 pounds.   DISCHARGE DIAGNOSES:  1. Abdominal pain.  2. Anxiety.  3. Weight loss 30 pounds.   CONSULTANT:  Dr. Wendall Papa for GI.   PROCEDURES:  1. CT scan of the abdomen and pelvis which revealed no acute abdominal      process, a 1.1 cm cystic lesion in the pancreatic tail most likely      representing a pseudocyst.  2. CT of the pelvis was unremarkable with a question of multifactorial      central canal stenosis at L3-4.   HISTORY OF PRESENT ILLNESS:  Patient is a 76 year old African American  gentleman who has a long list of multiple medical problems.  He  presented with a five-day history of suprapubic intermittent abdominal  pain and decreased p.o. intake secondary to decreased appetite.  The  patient reported a 30-pound weight loss over several months.  He reports  he had decreased appetite.  He had no nausea, vomiting, headache,  dizziness, chest pain, shortness of breath, dysuria, increased fatigue  or any other systemic symptoms.  The patient has had some mild  constipation.  The patient had had colonoscopy several years ago which  was normal.  The patient does report that he had been at behavioral  medicine as an patient for benzodiazepine detox and abuse.  He has a  history of alcohol abuse and reports his last drink was in July, the  patient is prescribed Zoloft, which causes significant abdominal  discomfort and appetite suppression, which he discontinued the past  several days prior to admission.  The patient was admitted because of  his weight loss and abdominal pain.   PAST MEDICAL HISTORY:  1. Hypertension.  2. Hyperlipidemia.  3. Hypogonadism.  4. Reflux with Barrett's esophagus.  5. Questionable history of CHF.  6. History of lacunar infarct CVA.  7. Coronary artery disease status post percutaneous intervention.  8. Substance abuse issues.  9. Anxiety/depression.  10.Osteoarthritis.  11.Obstructive sleep apnea.  12.Diverticulosis.   MEDICATIONS ON ADMISSION:  1. Vytorin 10/80 once daily.  2. AndroGel patch 1% gel.  3. Micardis/hydrochlorothiazide 80/25 once daily.  4. Flomax 0.4 mg daily.  5. Nexium 40 mg daily.  6. Atenolol 50 mg daily.  7. Cardura 2 mg daily.  8. Flonase 50 mcg one inhalation each nostril daily.   ALLERGIES:  The patient has no known drug allergies.   FAMILY HISTORY:  Father with polysubstance abuse.   SOCIAL HISTORY:  The patient is retired Production manager.  He is married, has a supportive wife, he has a supportive son.  He has  been abstemious since  August 2008.   REVIEW OF SYSTEMS:  Otherwise unremarkable.   PHYSICAL EXAMINATION ON ADMISSION:  VITAL SIGNS:  Significant for normal  vital signs including being afebrile.  CARDIOVASCULAR:  Exam was unremarkable.  ABDOMEN:  Protuberant, nontender with positive bowel sounds with no  guarding or rebound, with no palpable organosplenomegaly.   ADMITTING LABORATORY:  Hemoglobin 13.5 grams, white count was 11,000.  Chemistries were unremarkable with a potassium of 4, BUN and creatinine  were normal with a creatinine of 1.19, lipase was 61.  UA was negative.   CT findings as above.   HOSPITAL COURSE:  1. Abdominal pain.  The patient was admitted for abdominal pain and      discomfort.  CT imaging was negative as noted.  The patient      actually had improvement in his abdominal pain and discomfort over      his short hospital stay.  The patient was seen in consultation by the GI service.  They felt that  his  pancreatic cyst could be followed up as an outpatient with a CT  scan.  They felt that no further evaluation or testing was required at  this time.  They agree that the patient's weight loss could be related  to his intolerance of Zoloft with decreased appetite and discomfort as  well as a benzodiazepine detox.  1. The patient's other medical problems including cardiovascular,      hypertension, hyperlipidemia, CHF and BPH remained stable.  He will      continue all his home medications under the direction of Dr. [name].   PHYSICAL EXAMINATION AT DISCHARGE:  VITAL SIGNS:  Temperature of 98.2,  blood pressure 137/80, heart rate 64, respirations were 20.  GENERAL APPEARANCE:  This is a pleasant elderly gentleman who looks  younger than his stated chronologic age in no acute distress.  CHEST:  Clear.  CARDIOVASCULAR:  The patient was in the standing position when I walked  in the room.  He had a regular rate and rhythm in the standing position.  ABDOMEN:  Protuberant.  He had no guarding or rebound.  He had positive  bowel sounds.   DISCHARGE MEDICATIONS:  The patient will resume all of his home  medications.   Of note the patient had an episode of what seemed to be confusion in the  hospital as reported by R.N. staff.  He reported to her that he had an  alcohol craving.  He was given Ativan by the on-call physician, which  did help with his anxiety.   With the patient's history of benzodiazepine problems and recent  inpatient treatment, I have advised him that we will not continue  benzodiazepines.  I have advised that he contact his psychiatrist for a  follow-up appointment in near future to help manage his issues.   In regards polysubstance abuse, I have adamantly encouraged the patient  to attend AA meetings to continue his recovery and sobriety.  I have  encouraged him that if he attends AA and is doing well, he could be a  sponsor, that it would help his recovery and  also recover his self-  esteem.  He was very receptive to this suggestion.   CONDITION:  The patient's condition at time of discharge dictation is  stable and improved.      Rosalyn Gess Norins, MD  Electronically Signed     MEN/MEDQ  D:  12/07/2007  T:  12/07/2007  Job:  [id number]   cc:   Barbette Hair. Arlyce Dice, MD,FACG  [address]  [place]  [place] [address]   [name], MD  [phone] [address] [place], [place] [address]

## 2011-02-16 NOTE — Discharge Summary (Signed)
NAME:  Austin Sawyer, Austin Sawyer NO.:  [id number]   MEDICAL RECORD NO.:  [id number]          PATIENT TYPE:  IPS   LOCATION:  0502                          FACILITY:  [facility]   PHYSICIAN:  Austin Sawyer, M.D.      DATE OF BIRTH:  March 18, 1935   DATE OF ADMISSION:  11/14/2007  DATE OF DISCHARGE:  11/20/2007                               DISCHARGE SUMMARY   CHIEF COMPLAINT AND HISTORY OF PRESENT ILLNESS:  This was the second  admission to [facility] for this 76 year old African  American male married, voluntarily admitted.  He requesting detox of the  Xanax, taking for about 5-6 months.  He was originally detoxed in August  2008 at [facility] after relapsing on alcohol with heavy use after  25 years of abstinence.  Has been attending AA successfully but felt  that the Xanax was risking his sobriety.  Attempted to cut down to half  a dose about 2 months prior to this admission but became anxious, shaky,  felt that his thinking got confused, so he went back to his usual dose.   PAST PSYCHIATRIC HISTORY:  Second time at Behavior Health.  Last time  august 2008 after relapsing on alcohol.   ALCOHOL AND DRUG HISTORY:  As already stated.  Long history of alcohol  dependence, abstinence after he came for detox in August 2008.   MEDICAL HISTORY:  1. Obstructive sleep apnea.  2. Hypertension.  3. Gastroesophageal reflux.   MEDICATIONS:  1. Micardis 80/25 mg one daily.  2. Hydrochlorothiazide 25 mg per day.  3. Atenolol 50 mg per day.  4. Flomax 0.4 mg per day.  5. Nexium 40 mg per day.  6. Xanax 0.5 mg up to four times a day.   Physical exam failed to show any acute findings.   LABORATORY WORKUP:  Sodium 138, potassium 3.7, glucose 104, BUN 14.  Drug screen positive for benzodiazepines.   Mental status exam upon admission revealed an alert, cooperative male.  Mood:  Anxiety.  Affect:  Anxiety, ruminating, worrying.  Thought  processes were logical, coherent  and relevant.  Ruminating about wanting  to come off the Xanax, how he got to use it, how he got dependent,  concerned about the withdrawal, but no active suicidal or homicidal  ideas, no delusions.  Cognition well-preserved.   ADMISSION DIAGNOSES:  Axis AXIS I:  1.  Benzodiazepine dependence.  2.  Alcohol dependence in remission.  3.  Anxiety disorder, not otherwise  specified.  4.  Depressive disorder, not otherwise specified.  AXIS II: No diagnosis.  AXIS III:  1.  Hypertension.  2.  Obstructive sleep apnea.  3.  Gastroesophageal reflux.  AXIS IV: Moderate.  AXIS V:  On admission 5-50, highest GAF in the last year is 70.   COURSE IN THE HOSPITAL:  He was admitted.  He was started in individual  and group psychotherapy.  He detoxed with Librium.  As already stated,  he got on prescription medication Xanax, was going through stressful  moment, tried to stop it, experienced withdrawal.  Started  with three a  day for 3 months, then decreased it to twice a day, then wanted to stop  completely.  Went to the [facility], saw Dr. Mila Sawyer.  Having some  confusion, decreased concentration, forgetfulness.  Had seen Dr.  Donell Sawyer, who placed him on Zoloft.  Some confusion as to the way Zoloft  was going to work so he did not pursue.  Has not relapsed on alcohol.  Living with his wife of 48 years, has four children.  Retired 8 years.  Continued to ruminate about the use of Xanax and endorsed that did not  realize that the Xanax would create such dependence.  Has tried to go  off, got worse, ended up using more of the Xanax, 4-5.  Wanted to use a  medication that would not be addictive.  Wanted to address the  depression and the anxiety.  As the hospitalization progressed, we  pursued the detox with Librium.  He was willing to eventually go back on  Zoloft.  Motivated to abstain from any habit-forming medications.  Endorsed he was consumed with worryzation.  He felt the session with  his wife went  well.  We continued to work on stabilizing his mood, his  anxiety, started Zoloft.  He tolerated the Zoloft quite well and on  February 16 was in full contact with reality and had good several days,  willing and motivated to pursue outpatient treatment.  No suicidal or  homicidal ideas, no hallucinations or delusions.  Endorsed some early  positive response to the medications.   DISCHARGE DIAGNOSES:  AXIS I:  1.  Alcohol dependence, in remission.  2.  Benzodiazepine dependence.  3.  Anxiety disorder, not otherwise  specified.  2.  Depressive disorder, not otherwise specified.  AXIS II:  No diagnosis.  AXIS III:  1.  Hypertension.  2.  Obstructive sleep apnea.  3.  Gastroesophageal reflux.  AXIS IV:  Moderate.  AXIS V:  Upon discharge 55-60.   Discharged on:  1. Nexium 40 mg per day.  2. Micardis/hydrochlorothiazide 50-25 mg daily.  3. Atenolol 50 mg per day.  4. Flomax 0.4 mg per day.  5. Zoloft 50 mg per day.  6. Trazodone 50 mg at night for sleep.   Instructed not to take any more Xanax.  Follow-up by Dr. Donell Sawyer with  [facility].      Austin Sawyer, M.D.  Electronically Signed     IL/MEDQ  D:  12/18/2007  T:  12/19/2007  Job:  [id number]

## 2011-02-19 NOTE — Discharge Summary (Signed)
Austin Sawyer, Austin Sawyer                             ACCOUNT NO.:  [id number]   MEDICAL RECORD NO.:  [id number]                   PATIENT TYPE:  INP   LOCATION:  0351                                 [facility]AC[facility]   PHYSICIAN:  Rene Paci, M.D. [facility]          DATE OF BIRTH:  Jul 30, 1935   DATE OF ADMISSION:  07/24/2003  DATE OF DISCHARGE:  07/26/2003                                 DISCHARGE SUMMARY   DISCHARGE DIAGNOSES:  1. Acute right lacunar infarct with gait instability, improved.  2. Obstructive sleep apnea on bedtime continuous positive airway pressure     with 1 liter oxygen.  3. Hypertension.  4. History of hyperlipidemia, normal fasting lipid profile.  5. Gastroesophageal reflux disease.  6. Chronic prostatitis on oral antibiotics.   DISCHARGE MEDICATIONS:  Include Norvasc 5 mg p.o. daily plus Plavix 75 mg  p.o. daily.  Other medications are as prior to admission and include  atenolol  25 mg p.o. daily, lisinopril 40 mg p.o. daily, Lasix 20 mg p.o.  daily, Flomax 0.4 mg p.o. daily, Nexium 40 mg p.o. daily, and Cipro 500 mg  p.o. b.i.d. for duration as previously prescribed.   HOSPITAL FOLLOW UP:  Will be with his primary care physician, Wanda Plump,  M.D., to be scheduled by the patient in approximately 1 to 2 weeks to  reevaluate blood pressure control.  He is also welcome to be referred to  Marcelyn Bruins, M.D. of pulmonary division for a formal sleep evaluation.  He  was seen in consultation this hospitalization by Casimiro Needle B. Wert, M.D. who  reviewed and agreed with current settings as prescribed for his obstructive  sleep apnea.   CONDITION ON DISCHARGE:  Medically stable and improved.   HOSPITAL COURSE:  Problem 1.  ACUTE CEREBROVASCULAR ACCIDENT:  The patient  presented to the emergency room with two days of periodic loss of balance,  slight confusion as noted by his family, and questionable dysarthria.  No  headache, nausea, vomiting, or loss of consciousness.   A CT scan in the  emergency room revealed an acute right lacunar infarct and the patient was  admitted for further evaluation.  A 2-D echo was negative for any thrombus  and showed good LV function.  Carotid Dopplers are pending at time of this  dictation but anticipated to be normal.  An MRI was not pursued during this  admission as the evidence was found on CT for acute infarct.  He is being  seen by PT/OT at this time prior to discharge and home health therapy will  be arranged if needed for continued gait training.  Otherwise, efforts are  made for hypertensive control as this is his largest risk factor.  Fasting  lipids were checked and found to be within normal range not on any statin or  other medication.  Adjustments were made in his blood pressure regimen as  described above.  Atenolol was briefly increased during this  hospitalization.  The patient had asymptomatic bradycardia in the 40s, so  this dose of atenolol was again reduced back to previous to admission of 25  mg daily atenolol.   Problem 2.  OBSTRUCTIVE SLEEP APNEA:  Pulmonology was consulted during this  admission.  Dr. Sherene Sires saw the patient and reviewed his previous workup which  had been done in [place], [place] several years ago.  O2 saturations were  checked with ambulation as well as at rest and while sleeping.  Recommendations are made for a 1-liter oxygen while sleeping only and  continue his home CPAP settings.  As we have no records of formal sleep  study evaluation, he recommended outpatient evaluation by Marcelyn Bruins, M.D.  which the patient is agreeable to and this can be arranged in the future.   LABORATORY DATA:  At the time of discharge showed a negative UA, negative  UDS.  Normal BMET with a sodium of 139, potassium 3.8, chloride 107, bicarb  27, glucose 84, BUN 15, creatinine 1.1, and calcium fo 8.6.  Normal CBC with  white count 9.1, hemoglobin 13.1, platelet count 178 with a normal  differential.   Total cholesterol was 148, triglycerides 118, HDL 40, LDL 85.                                               Rene Paci, M.D. [facility]    VL/MEDQ  D:  07/26/2003  T:  07/26/2003  Job:  [id number]

## 2011-02-19 NOTE — Cardiovascular Report (Signed)
[place]. [facility]  Patient:    KAIVEN, VESTER                          MRN: [id number] Adm. Date:  [id number] Attending:  Talitha Givens CC:         [name], M.D. [facility]   Cardiac Catheterization  PROCEDURE:  Coronary arteriography.  INDICATIONS:  Status post PTCA and stenting of the LAD in January in [place].  Recurrent chest burning.  FINDINGS: 1. The left main coronary artery has a 20% discrete stenosis.  This was a    long vessel. 2. The left anterior descending artery had a widely patent stent in the mid    portion.  There was an 80% ostial lesion in the first diagonal branch as it    took off from the stent.  The mid and distal vessel had 20-30% multiple    discrete lesions. 3. The circumflex coronary artery was codominant.  There were 30-40% multiple    discrete lesions in the mid and distal portion. 4. The first obtuse marginal branch had 30% multiple discrete lesions. 5. There was a large ramus branch with 30% multiple discrete lesions. 6. The right coronary artery was codominant.  There were 20-30% multiple    discrete lesions in the mid and distal portion.  RAO ventriculography revealed normal wall motion.  The EF was calculated at 66%.  There was no gradient across the aortic valve and no MR.  The post A wave EDP was 30 mmHg.  IMPRESSION:  The films were reviewed with Dr. Gerri Spore.  He felt that an initial trial of medical therapy was warranted.  The diagonal would be somewhat difficult to intervene upon since this is a 2.0 vessel, and we would have to go through the stent.  If the patient has persistent symptoms, however, Dr. Gerri Spore felt that balloon dilatation through the stent was possible.  Initial trial of medical therapy will be offered.  The patient will have Imdur 30 mg added to his regimen.  Dr. Gerri Spore also felt that it would be useful to get the patients post PTCA catheterization report to see if the  diagonal had been jeopardized or worked on in the initial stent procedure.  The patient will most likely be discharged in the morning if his leg heals well. DD:  06/28/00 TD:  06/28/00 Job: [id number]

## 2011-02-19 NOTE — Discharge Summary (Signed)
Austin Sawyer. [facility]  Patient:    Austin, Sawyer Visit Number: [id number] MRN: [id number]          Service Type: MED Location: [phone] Attending Physician:  Nelta Numbers Dictated by:   Austin Sawyer, P.A. [facility] Admit Date:  10/31/2001 Discharge Date: 11/03/2001   CC:         Austin Sawyer, M.D. [facility]  Austin Sawyer. [name], M.D.  in [place], [place]   Discharge Summary  DISCHARGE DIAGNOSES: 1. Chest pain, resolved. 2. Known coronary artery disease. 3. Hypertension.  HOSPITAL COURSE:  [name] is a 76 year old male with a known history of coronary artery disease who is admitted to [facility] in September 2001 for chest pain.  He had a stent implanted apparently in an LAD lesion that was patent; however, a residual 80% lesion of his first diagonal was seen.  He was kept on medical therapy for this.  He has done well since then; however, he was admitted on October 31, 2001 with substernal chest pain and dyspnea.  His cardiac isoenzymes and troponins were negative.  A d-dimer was positive at 0.75; however, a CT of the chest revealed atelectasis but no PE.  As a result, an Adenosine-Cardiolite was performed which revealed no ischemia.  Apparently, the ejection fraction during his last catheterization was 66%; however, the gaited EF on his Adenosine-Cardiolite read out at 43%.  However, Dr. Theron Arista C. Sawyer felt this was not an accurately gaited study, therefore follow-up echocardiogram in the office was requested.  The patient was stable for discharge to home on November 03, 2001.  He was currently pain-free.  His discharge medications were the same as his admission medications.  DISCHARGE MEDICATIONS: 1. Nexium 1 p.o. q.d. 2. Aspirin 325 mg 1 p.o. q.d. 3. Lipitor 1 p.o. q.d. 4. Tenormin 50 mg 1 p.o. q.d. 5. Lisinopril 40 mg 1 p.o. q.d. 6. Flomax 0.4 mg 1 p.o. q.d. 7. He is to resume his fluid pill (he did not bring  this with him).  ACTIVITY:  Gradually increase his activity over the next several days.  DIET:  Remain on a low fat diet.  FOLLOW-UP:  He has a follow-up appointment with Dr. Luis Sawyer at 3 p.m. on November 15, 2001.  He is to be at his office at 2 p.m., however, for an echocardiogram prior to the visit.  After this visit, he is to follow up with Dr. Macarthur Sawyer. Austin Sawyer. Dictated by:   Austin Sawyer, P.A. [facility] Attending Physician:  Nelta Numbers DD:  11/03/01 TD:  11/03/01 Job: [id number]

## 2011-02-19 NOTE — H&P (Signed)
[place]. [facility]  Patient:    Austin Sawyer, Austin Sawyer Visit Number: [id number] MRN: [id number]          Service Type: MED Location: [phone] Attending Physician:  Nelta Numbers Dictated by:   Gerrit Friends. Dietrich Pates, M.D. [facility] Admit Date:  10/31/2001                           History and Physical  CHIEF COMPLAINT:  A 76 year old gentleman with known coronary disease presents with persistent chest heaviness and dyspnea.  HISTORY OF PRESENT ILLNESS:  [name] is known to our group from a number of admissions in 2001.  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  At the [facility], he was found to have coronary disease.  A stent was implanted in his left anterior descending coronary.  On admission to [facility] on September 2001, he had recurrent chest pain prompting cardiac catheterization.  This revealed patent stent site but a residual 80% lesion in the first diagonal.  The recommendation was made for continued medical therapy.  He has done well since then and subsequently began treatment with both primary care doctor and cardiologist in [facility].  He had a stress Cardiolite study approximately one year ago and has had an echocardiogram in [facility].  Apparently, results of these studies were good.  CURRENT MEDICATIONS:  His current medication is somewhat uncertain but apparently includes sublingual nitroglycerin, which he uses rarely.  Aciphex, which was recently changed from Nexium by his gastroenterologist after an upper endoscopy that apparently revealed no significant problems. Enteric-coated aspirin, Flomax, diuretic-probably thiazide, Atenolol, Lisinopril, and Lipitor.  PAST MEDICAL HISTORY:  Unremarkable.  ALLERGIES:  The patient reports no allergies.  SOCIAL HISTORY:  No tobacco products.  No significant use of alcohol.  FAMILY HISTORY:  Negative for  coronary disease.  REVIEW OF SYSTEMS:  Occasional ankle swelling.  Generally good control of hypertension.  Otherwise all systems are negative.  PHYSICAL EXAMINATION:  GENERAL:  A pleasant, mildly overweight gentleman in no acute distress.  VITAL SIGNS:  The heart rate is 70 and regular, blood pressure 160/90, respirations 16 and unlabored.  Afebrile.  Weight was 114 kg.  HEENT:  Anicteric sclerae.  NECK:  No jugular venous distension.  No carotid bruits.  ENDOCRINE:  No thyromegaly.  SKIN:  No significant lesions.  CHEST:  Resonant to percussion and clear to auscultation.  CARDIOVASCULAR:  Normal first and second heart sounds.  Fourth heart sound present.  ABDOMEN:  Benign without organomegaly.  Nontender.  EXTREMITIES:  Distal pulses intact.  No edema.  LABORATORY DATA:  EKG shows normal sinus rhythm.  Borderline voltage criteria for left ventricular hypertrophy.  Q-wave in V2, question of previous septal myocardial infarction.  Nonspecific inferior ST/T-wave abnormalities.  Initial cardiac markers are negative.  IMPRESSION:  Austin Sawyer is a nice gentleman with known coronary disease who has had a number of hospital admissions with chest pain that apparently did not represent an acute ischemic syndrome.  He finds it difficult to distinguish between gastrointestinal symptoms and cardiac symptoms.  His chest pain now is persistent without acute electrocardiogram abnormalities.  Serial cardiac markers will be obtained as well as a repeat electrocardiogram in the morning.  He will initially be treated with intravenous nitroglycerin plus his usual medications and analgesics as required.  If symptoms resolve and testing is negative, I  would not be inclined to proceed with additional studies here but would refer him back to his primary care doctor and cardiologist in [facility].  Lipid profile will be assessed as well as control of hypertension. Dictated by:   Gerrit Friends.  Dietrich Pates, M.D. [facility] Attending Physician:  Nelta Numbers DD:  10/31/01 TD:  11/01/01 Job: [id number]

## 2011-02-19 NOTE — H&P (Signed)
NAME:  Austin Sawyer, Austin Sawyer                             ACCOUNT NO.:  [id number]   MEDICAL RECORD NO.:  [id number]                   PATIENT TYPE:  EMS   LOCATION:  ED                                   FACILITY:  [facility]   PHYSICIAN:  Georgina Quint. Plotnikov, M.D. [facility]      DATE OF BIRTH:  1935-10-04   DATE OF ADMISSION:  07/24/2003  DATE OF DISCHARGE:                                HISTORY & PHYSICAL   CHIEF COMPLAINT:  Confused, lost balance.   HISTORY OF PRESENT ILLNESS:  The patient is a 76 year old male who presents  with two days of periodic loss of balance, speech problems, confusion, worse  today.  There has been no headache, nausea, vomiting, loss of consciousness.  Presented to the ER.  A CT scan revealed lacunar stroke.   ALLERGIES:  None.   PAST MEDICAL HISTORY:  1. Hypertension.  2. Hyperlipidemia.  3. GERD.  4. Sleep apnea.   SOCIAL HISTORY:  He does not smoke; quit smoking more than 20 years ago.  Does not drink alcohol.  He is married, retired man.   MEDICATIONS:  1. Cipro 500 mg p.o. b.i.d. started yesterday for prostatitis.  2. Lasix 20 mg daily.  3. Atenolol 25 mg daily.  4. Lisinopril 40 mg daily.  5. Flomax 0.4 mg daily.  6. Aspirin 81 mg daily.  7. Nexium 40 mg daily.   FAMILY HISTORY:  Positive for CVA.   REVIEW OF SYSTEMS:  Blurred vision, problems with CPAP machine lately.  No  headaches, no chest pains, no leg swelling.  The rest is negative or as  above.   PHYSICAL EXAMINATION:  VITAL SIGNS:  Blood pressure 187/99, heart rate 66,  respirations 18, saturation is 99% on room air.  GENERAL:  He is in no acute distress.  Overweight.  HEENT:  With moist mucosa.  NECK:  Short, supple, no bruit.  LUNGS:  Clear, no wheezes or rales.  HEART:  S1 and S2, no gallop, no murmur, no enlargement to percussion.  ABDOMEN:  Soft, nontender, no organomegaly and no mass felt.  EXTREMITIES:  Lower extremities without edema.  NEUROLOGIC:  He is alert, oriented,  cooperative.  Normal facial droop.  Pupils reactive to light.  Eyes with full range of motion, no double vision.   LABORATORY DATA:  CT scan of the right occipital lobe, lacunar infarct, no  bleeding.  White count 9.1, hemoglobin 13.1.  Urinalysis normal.  Potassium  2.8, glucose 84.  CK 399, troponin 0.01.   ASSESSMENT AND PLAN:  1. Lacunar cerebrovascular accident.  Obtain ultrasound of the carotids and     cardiac echocardiogram, start on Plavix, control blood pressure.  2. Hypertension.  We will keep blood pressure less or equal to 190 for     systolic.  Increase Atenolol to twice a day.  Continue other therapies,     intravenous Lasix.  3. Sleep apnea.  On therapy.  He  will need a pulmonary consultation     tomorrow.  It sounds like his CPAP machine needs to be adjusted.  4. Probable nighttime hypopnea with hypoxia.  He was on oxygen at nighttime.  5. Hyperlipidemia.  We will check fasting lipid profile.  Will need therapy.  6. Prostatitis.  Started yesterday on Cipro, will continue p.o.                                               Georgina Quint. Plotnikov, M.D. [facility]    AVP/MEDQ  D:  07/24/2003  T:  07/24/2003  Job:  [id number]   cc:   Wanda Plump, MD [facility]  [phone] [address] [place], [place] [address]

## 2011-02-19 NOTE — Discharge Summary (Signed)
[place]  Patient:    Austin Sawyer, Austin Sawyer                          MRN: [id number] Adm. Date:  [id number] Disc. Date: 06/29/00 Attending:  Talitha Givens Dictator:   Lavella Hammock, P.A.                           Discharge Summary  DATE OF BIRTH:  1935/08/17  PROCEDURE: 1. Cardiac catheterization. 2. Coronary arteriogram. 3. Left ventriculogram.  HOSPITAL COURSE:  Austin Sawyer is a 76 year old male with a history of a stent to his LAD in [place] in January of 2001.  He had return of chest burning which was his present symptom and he came to the [facility] emergency room.  He was admitted for cardiac catheterization and further evaluation.  He had a cardiac catheterization on June 28, 2000, which showed a 20% left main, an LAD with a patent stent and 20% mid and 30% distal lesion.  The first diagonal had an 80% ostial stenosis at the stent site and it was a 2.0 mm vessel.  The intermediate was long with a 20% stenosis. Circumflex had a 30 to 40% mid and was codominant with the RCA which also had a 30% mid lesion.  The left ventricle was normal with an EF of 65%.  Medical therapy was recommended.  Dr. Eden Emms, who did the cath, also recommended that he get a Cardiolite in four to six weeks to assess the diagonal.  The next day, the cath site was without hematoma, bruising, or ecchymosis.  There was no bruit and pulses were intact distally.  His blood pressure was elevated at 174/80, but his medications were adjusted.  He was considered stable for discharge on June 29, 2000.  Chest x-ray; the heart was normal for a portable study and lungs were clear. There is no acute process.  LABORATORY DATA:  Hemoglobin 12.8, hematocrit 37.6, WBC 9.4, and platelets 153.  Sodium 138, potassium 4.2, chloride 105, CO2 28, BUN 14, creatinine 0.8, and glucose 94.  Serial CKs were slightly elevated, but MBs were normal and troponin  Is were negative serially.  Total cholesterol 177, triglycerides 48, HDL 49, LDL 118, TSH 1.550.  CONDITION ON DISCHARGE:  Stable.  CONSULTING PHYSICIANS:  None.  COMPLICATIONS:  None.  DISCHARGE DIAGNOSES: 1. Coronary artery disease, status post previous stent to the left anterior    descending with an 80% residual diagonal lesion. 2. Hypertension. 3. Hyperlipidemia. 4. Gastroesophageal reflux disease. 5. Status post hernia repair.  DISCHARGE INSTRUCTIONS: 1. His activity level is to include no driving, no sexual or strenuous    activity for two days. 2. He is to stick to a low fat diet. 3. He is to call the office for bleeding, swelling, or drainage at the    cath site. 4. He is to follow up with Dr. Myrtis Ser on October 15, at 11:30 a.m. 5. He needs to obtain a primary M.D.  DISCHARGE MEDICATIONS: 1. Nitroglycerin 0.4 mg sublingual p.r.n. 2. Imdur 60 mg p.o. q.d. 3. Coated aspirin 325 mg q.d. 4. Zestril 40 mg q.d. 5. Nexium 40 mg q.d. 6. Zebeta 10 mg q.d. 7. Zocor 20 mg q.d. 8. Cardura 4 mg q.d. DD:  06/29/00 TD:  06/29/00 Job: [id number]

## 2011-02-19 NOTE — Discharge Summary (Signed)
NAME:  Austin Sawyer, HUYNH NO.:  [id number]   MEDICAL RECORD NO.:  [id number]          PATIENT TYPE:  IPS   LOCATION:  0501                          FACILITY:  [facility]   PHYSICIAN:  Geoffery Lyons, M.D.      DATE OF BIRTH:  1934-12-11   DATE OF ADMISSION:  12/08/2007  DATE OF DISCHARGE:  12/16/2007                               DISCHARGE SUMMARY   CHIEF COMPLAINT/HISTORY OF PRESENT ILLNESS:  This was the second  admission to [facility] for this 76 year old male  admitted with a history of depression and alcohol abuse.  He relapsed.  He endorsed problems with the Zoloft side effects.  Concerned about full-  blown DTs.  Relapsed after 20 years.   PAST PSYCHIATRIC HISTORY:  Second time at [facility], the last time  being 12/11/2007 for detox of Xanax.   SUBSTANCE ABUSE HISTORY:  Recently relapsed on alcohol.  Previously, he  had been placed on Xanax.   MEDICAL HISTORY:  1. Hypertension.  2. Sleep apnea.  3. Gastroesophageal reflux.  4. Gout.   ________ performed has failed to show any acute findings.   LABORATORY DATA:  None available in the chart.   MENTAL STATUS EXAM:  Mental status exam reveals an alert, cooperative  male.  Speech was normal in rate, tempo, and production.  Mood anxious.  Affect anxious.  Thought processes logical, coherent, and relevant.  Ruminative worry.  Wanting to come off the alcohol; at the same time,  wanting some help with the depression and anxiety; not wanting to be on  a benzodiazepine, nothing addictive.  No suicidal or homicidal ideas.  No delusions.  No hallucinations.  Cognition well-preserved.   DIAGNOSIS:  AXIS I:  Alcohol dependence; depressive disorder not  otherwise specified.  AXIS II:  No diagnosis.  AXIS III:  Hypertension, sleep apnea, gastroesophageal reflux.  AXIS IV:  Moderate.  AXIS V:  GAF on admission 35.  Highest GAF in the last year 60.   COURSE IN THE HOSPITAL:  He was admitted and started  on individual and  group psychotherapy.  He was on Vytorin 10/80 mg per day, AndroGel  patch, Micardis/ hydrochlorothiazide 80/25 one daily, Flomax 0.4 mg per  day, Nexium 40 mg per day, atenolol 50 mg per day, Cardura 2 mg per day,  and Flonase 50 mg per day.  He was maintained on these medications.   He did endorse that once he left the hospital, he was very anxious,  feeling agitated, irritable.  He started feeling that the Zoloft was  making things worse for him so he discontinued the Zoloft.  Then he  started having cravings for alcohol again.  He drank 1 beer, then had  the second one.  He saw himself getting back in the same situation in  which he was before.  He endorsed increased craving for alcohol and  asked for help.  The wife did endorse that she thought the  antidepressant made him worse.  We pursued detox.  We reassessed the  medications.  We placed him on Remeron since  sleep was a major concern.  On 12/12/2007, he endorsed that he was more depressed, very low mood,  decreased energy, decreased motivation.  He endorsed that he surely did  not feel like.  Very upset with the wife as she is trying to convince  him not to take medications.  Endorsed depressed mood.  The affect was  depressed with a sense of hopelessness and helplessness.  He did start  tolerating the Remeron well.  No side effects.  It was helpful for  sleep.  Concerned about his depression and anxiety but also said he was  better.  Concerned about the relationship with his wife.  Endorsed that  she mothers him, and that has created a lot of anxiety and conflict  between them.  Willing to start the conversation with wife in order to  address his concerns.  Wanting to pursue further work on their issues.  On March 12, he was still tolerating the Remeron well and continued to  focus more on the interaction with the wife.  He continued to work on  Pharmacologist and relapse prevention.  On March 13, he endorsed  that he  had seen a change in his wife's behavior.  He was encouraged since he  has not seen her expressing the feelings towards him that he was  expressing lately.  He continued to tolerate this medication well.   There was a family session with the wife.  He endorsed that he was  feeling good and comfortable with the medication he was taking.  The  patient endorsed that not sleeping and having panic attacks were his  major problems, but endorsed he was sleeping better.  He was able to  tell his wife how he felt secondary to some of her behaviors.  On March  14, he was in full contact reality.  There were no active suicidal  ideas, no hallucinations or delusions.  He felt better.  Mood improved.  Affect brighter, sleeping through the night.  No anxiety.  We went ahead  and discharged him to outpatient followup.   DISCHARGE DIAGNOSIS:  AXIS I:  Alcohol dependence; depressive disorder  not otherwise specified; anxiety disorder not otherwise specified.  AXIS II:  No diagnosis.  AXIS III:  Hypertension, sleep apnea, gastroesophageal reflux.  AXIS IV:  Moderate.  AXIS V:  GAF on discharge was 60.   The patient was discharged on the following medications:  1. Micardis/hydrochlorothiazide 80/25.  2. Flomax 0.4 mg per day.  3. Nexium 40 mg per day.  4. Tenormin 50 mg per day.  5. Cardura 2 mg daily.  6. Flonase 0.05% nasal spray.  7. Remeron 50 mg 1-2 at bedtime.  8. Vytorin 10/80 mg 1 daily.   FOLLOWUP:  Follow up at the [facility].      Geoffery Lyons, M.D.  Electronically Signed     IL/MEDQ  D:  01/15/2008  T:  01/15/2008  Job:  [id number]

## 2011-06-25 LAB — BASIC METABOLIC PANEL
BUN: 14
CO2: 30
Calcium: 9.1
Creatinine, Ser: 1.3
GFR calc non Af Amer: 55 — ABNORMAL LOW
Glucose, Bld: 104 — ABNORMAL HIGH
Sodium: 138

## 2011-06-25 LAB — RAPID URINE DRUG SCREEN, HOSP PERFORMED: Tetrahydrocannabinol: NOT DETECTED

## 2011-06-28 LAB — COMPREHENSIVE METABOLIC PANEL
AST: 18
Albumin: 3.6
Albumin: 3.8
Alkaline Phosphatase: 49
BUN: 13
BUN: 14
BUN: 7
CO2: 23
CO2: 28
Calcium: 9.2
Calcium: 9.6
Chloride: 100
Chloride: 98
Creatinine, Ser: 1.1
Creatinine, Ser: 1.19
Creatinine, Ser: 1.24
GFR calc Af Amer: >60
GFR calc Af Amer: >60
GFR calc non Af Amer: >60
GFR calc non Af Amer: >60
Glucose, Bld: 100 — ABNORMAL HIGH
Potassium: 4
Total Bilirubin: 1.1
Total Bilirubin: 1.1
Total Protein: 7.4

## 2011-06-28 LAB — LIPID PANEL
Cholesterol: 266 — ABNORMAL HIGH
LDL Cholesterol: 214 — ABNORMAL HIGH
Triglycerides: 149
VLDL: 30

## 2011-06-28 LAB — CBC
HCT: 38.7 — ABNORMAL LOW
HCT: 41
HCT: 41.4
Hemoglobin: 13.5
MCHC: 34.9
MCV: 85
MCV: 85.2
MCV: 86.1
Platelets: 197
RBC: 4.5
RBC: 4.87
RDW: 13.3
WBC: 11 — ABNORMAL HIGH

## 2011-06-28 LAB — URINALYSIS, ROUTINE W REFLEX MICROSCOPIC
Glucose, UA: NEGATIVE
Hgb urine dipstick: NEGATIVE
Hgb urine dipstick: NEGATIVE
Ketones, ur: NEGATIVE
Nitrite: NEGATIVE
Protein, ur: NEGATIVE
Protein, ur: NEGATIVE
Specific Gravity, Urine: 1.004 — ABNORMAL LOW
Urobilinogen, UA: 0.2
Urobilinogen, UA: 0.2

## 2011-06-28 LAB — RAPID URINE DRUG SCREEN, HOSP PERFORMED
Amphetamines: NOT DETECTED
Barbiturates: NOT DETECTED
Opiates: NOT DETECTED
Tetrahydrocannabinol: NOT DETECTED

## 2011-06-28 LAB — HEPATIC FUNCTION PANEL
ALT: 20
Alkaline Phosphatase: 48
Bilirubin, Direct: 0.1
Indirect Bilirubin: 0.9
Total Protein: 7

## 2011-06-28 LAB — DIFFERENTIAL
Basophils Absolute: 0
Lymphocytes Relative: 10 — ABNORMAL LOW
Lymphs Abs: 1.1
Neutro Abs: 9.3 — ABNORMAL HIGH
Neutrophils Relative %: 83 — ABNORMAL HIGH

## 2011-06-28 LAB — PSA: PSA: 0.67

## 2011-06-28 LAB — LIPASE, BLOOD
Lipase: 68 — ABNORMAL HIGH
Lipase: 103 — ABNORMAL HIGH

## 2011-06-28 LAB — CANCER ANTIGEN 19-9: CA 19-9: 16.8 — ABNORMAL LOW (ref <35.0)

## 2011-06-28 LAB — SALICYLATE LEVEL: Salicylate Lvl: <4

## 2011-06-28 LAB — URIC ACID: Uric Acid, Serum: 8.2 — ABNORMAL HIGH

## 2011-07-19 LAB — BASIC METABOLIC PANEL
BUN: 11
CO2: 22
Calcium: 9
Chloride: 107
Creatinine, Ser: 1.07
GFR calc Af Amer: >60
GFR calc non Af Amer: >60
Glucose, Bld: 103 — ABNORMAL HIGH
Potassium: 3.7
Sodium: 138

## 2011-07-19 LAB — URINE MICROSCOPIC-ADD ON

## 2011-07-19 LAB — TSH: TSH: 0.959

## 2011-07-19 LAB — HEPATIC FUNCTION PANEL
ALT: 19
AST: 16
Albumin: 3.5
Alkaline Phosphatase: 51
Bilirubin, Direct: 0.1
Indirect Bilirubin: 0.3
Total Bilirubin: 0.4
Total Protein: 7.1

## 2011-07-19 LAB — URINALYSIS, ROUTINE W REFLEX MICROSCOPIC
Bilirubin Urine: NEGATIVE
Glucose, UA: NEGATIVE
Hgb urine dipstick: NEGATIVE
Ketones, ur: NEGATIVE
Leukocytes, UA: NEGATIVE
Nitrite: NEGATIVE
Protein, ur: NEGATIVE
Specific Gravity, Urine: 1.021
Urobilinogen, UA: 0.2
pH: 5

## 2011-07-19 LAB — RAPID URINE DRUG SCREEN, HOSP PERFORMED
Barbiturates: NOT DETECTED
Benzodiazepines: POSITIVE — AB
Cocaine: NOT DETECTED

## 2011-07-19 LAB — CBC
HCT: 43.2
Hemoglobin: 14.8
MCV: 84.6
WBC: 11.8 — ABNORMAL HIGH

## 2011-07-19 LAB — DIFFERENTIAL
Eosinophils Absolute: 0.3
Eosinophils Relative: 3
Lymphocytes Relative: 11 — ABNORMAL LOW
Lymphs Abs: 1.3
Monocytes Absolute: 0.9 — ABNORMAL HIGH
Monocytes Relative: 8

## 2011-07-19 LAB — COMPREHENSIVE METABOLIC PANEL
ALT: 20
AST: 21
Albumin: 3.6
Chloride: 100
Creatinine, Ser: 1.15
GFR calc Af Amer: >60
Sodium: 133 — ABNORMAL LOW
Total Bilirubin: 0.8

## 2011-07-19 LAB — VITAMIN B12: Vitamin B-12: 1624 — ABNORMAL HIGH (ref 211–911)

## 2011-07-19 LAB — RPR: RPR Ser Ql: NONREACTIVE

## 2011-07-19 LAB — TRICYCLICS SCREEN, URINE: TCA Scrn: NOT DETECTED

## 2011-07-19 LAB — ETHANOL: Alcohol, Ethyl (B): 58 — ABNORMAL HIGH

## 2011-10-15 DIAGNOSIS — E291 Testicular hypofunction: Secondary | ICD-10-CM | POA: Diagnosis not present

## 2011-10-15 DIAGNOSIS — N529 Male erectile dysfunction, unspecified: Secondary | ICD-10-CM | POA: Diagnosis not present

## 2011-10-15 DIAGNOSIS — R3915 Urgency of urination: Secondary | ICD-10-CM | POA: Diagnosis not present

## 2011-10-15 DIAGNOSIS — N400 Enlarged prostate without lower urinary tract symptoms: Secondary | ICD-10-CM | POA: Diagnosis not present

## 2011-10-25 DIAGNOSIS — I2510 Atherosclerotic heart disease of native coronary artery without angina pectoris: Secondary | ICD-10-CM | POA: Diagnosis not present

## 2011-10-25 DIAGNOSIS — E119 Type 2 diabetes mellitus without complications: Secondary | ICD-10-CM | POA: Diagnosis not present

## 2011-10-25 DIAGNOSIS — E785 Hyperlipidemia, unspecified: Secondary | ICD-10-CM | POA: Diagnosis not present

## 2011-10-25 DIAGNOSIS — E559 Vitamin D deficiency, unspecified: Secondary | ICD-10-CM | POA: Diagnosis not present

## 2011-10-25 DIAGNOSIS — G63 Polyneuropathy in diseases classified elsewhere: Secondary | ICD-10-CM | POA: Diagnosis not present

## 2011-10-25 DIAGNOSIS — I10 Essential (primary) hypertension: Secondary | ICD-10-CM | POA: Diagnosis not present

## 2011-10-25 DIAGNOSIS — M109 Gout, unspecified: Secondary | ICD-10-CM | POA: Diagnosis not present

## 2011-11-08 DIAGNOSIS — G63 Polyneuropathy in diseases classified elsewhere: Secondary | ICD-10-CM | POA: Diagnosis not present

## 2011-11-08 DIAGNOSIS — I10 Essential (primary) hypertension: Secondary | ICD-10-CM | POA: Diagnosis not present

## 2011-11-08 DIAGNOSIS — I2510 Atherosclerotic heart disease of native coronary artery without angina pectoris: Secondary | ICD-10-CM | POA: Diagnosis not present

## 2011-11-08 DIAGNOSIS — E559 Vitamin D deficiency, unspecified: Secondary | ICD-10-CM | POA: Diagnosis not present

## 2011-11-08 DIAGNOSIS — E119 Type 2 diabetes mellitus without complications: Secondary | ICD-10-CM | POA: Diagnosis not present

## 2011-11-08 DIAGNOSIS — M109 Gout, unspecified: Secondary | ICD-10-CM | POA: Diagnosis not present

## 2011-11-08 DIAGNOSIS — E785 Hyperlipidemia, unspecified: Secondary | ICD-10-CM | POA: Diagnosis not present

## 2011-11-23 DIAGNOSIS — M109 Gout, unspecified: Secondary | ICD-10-CM | POA: Diagnosis not present

## 2011-11-23 DIAGNOSIS — I10 Essential (primary) hypertension: Secondary | ICD-10-CM | POA: Diagnosis not present

## 2011-11-23 DIAGNOSIS — I2510 Atherosclerotic heart disease of native coronary artery without angina pectoris: Secondary | ICD-10-CM | POA: Diagnosis not present

## 2011-11-23 DIAGNOSIS — E559 Vitamin D deficiency, unspecified: Secondary | ICD-10-CM | POA: Diagnosis not present

## 2011-11-23 DIAGNOSIS — E119 Type 2 diabetes mellitus without complications: Secondary | ICD-10-CM | POA: Diagnosis not present

## 2011-11-23 DIAGNOSIS — G63 Polyneuropathy in diseases classified elsewhere: Secondary | ICD-10-CM | POA: Diagnosis not present

## 2011-11-23 DIAGNOSIS — E785 Hyperlipidemia, unspecified: Secondary | ICD-10-CM | POA: Diagnosis not present

## 2011-11-23 DIAGNOSIS — Z713 Dietary counseling and surveillance: Secondary | ICD-10-CM | POA: Diagnosis not present

## 2011-12-13 DIAGNOSIS — E119 Type 2 diabetes mellitus without complications: Secondary | ICD-10-CM | POA: Diagnosis not present

## 2011-12-13 DIAGNOSIS — G63 Polyneuropathy in diseases classified elsewhere: Secondary | ICD-10-CM | POA: Diagnosis not present

## 2011-12-13 DIAGNOSIS — E559 Vitamin D deficiency, unspecified: Secondary | ICD-10-CM | POA: Diagnosis not present

## 2011-12-13 DIAGNOSIS — E785 Hyperlipidemia, unspecified: Secondary | ICD-10-CM | POA: Diagnosis not present

## 2011-12-13 DIAGNOSIS — I10 Essential (primary) hypertension: Secondary | ICD-10-CM | POA: Diagnosis not present

## 2011-12-13 DIAGNOSIS — I2510 Atherosclerotic heart disease of native coronary artery without angina pectoris: Secondary | ICD-10-CM | POA: Diagnosis not present

## 2011-12-13 DIAGNOSIS — M109 Gout, unspecified: Secondary | ICD-10-CM | POA: Diagnosis not present

## 2011-12-22 DIAGNOSIS — I10 Essential (primary) hypertension: Secondary | ICD-10-CM | POA: Diagnosis not present

## 2011-12-22 DIAGNOSIS — E785 Hyperlipidemia, unspecified: Secondary | ICD-10-CM | POA: Diagnosis not present

## 2011-12-22 DIAGNOSIS — E559 Vitamin D deficiency, unspecified: Secondary | ICD-10-CM | POA: Diagnosis not present

## 2011-12-22 DIAGNOSIS — M109 Gout, unspecified: Secondary | ICD-10-CM | POA: Diagnosis not present

## 2011-12-22 DIAGNOSIS — G63 Polyneuropathy in diseases classified elsewhere: Secondary | ICD-10-CM | POA: Diagnosis not present

## 2011-12-22 DIAGNOSIS — E119 Type 2 diabetes mellitus without complications: Secondary | ICD-10-CM | POA: Diagnosis not present

## 2011-12-22 DIAGNOSIS — I2510 Atherosclerotic heart disease of native coronary artery without angina pectoris: Secondary | ICD-10-CM | POA: Diagnosis not present

## 2011-12-22 DIAGNOSIS — Z713 Dietary counseling and surveillance: Secondary | ICD-10-CM | POA: Diagnosis not present

## 2012-01-04 DIAGNOSIS — H5034 Intermittent alternating exotropia: Secondary | ICD-10-CM | POA: Diagnosis not present

## 2012-01-04 DIAGNOSIS — E119 Type 2 diabetes mellitus without complications: Secondary | ICD-10-CM | POA: Diagnosis not present

## 2012-01-04 DIAGNOSIS — H538 Other visual disturbances: Secondary | ICD-10-CM | POA: Diagnosis not present

## 2012-01-05 DIAGNOSIS — I10 Essential (primary) hypertension: Secondary | ICD-10-CM | POA: Diagnosis not present

## 2012-01-05 DIAGNOSIS — G63 Polyneuropathy in diseases classified elsewhere: Secondary | ICD-10-CM | POA: Diagnosis not present

## 2012-01-05 DIAGNOSIS — M109 Gout, unspecified: Secondary | ICD-10-CM | POA: Diagnosis not present

## 2012-01-05 DIAGNOSIS — E119 Type 2 diabetes mellitus without complications: Secondary | ICD-10-CM | POA: Diagnosis not present

## 2012-01-05 DIAGNOSIS — I2510 Atherosclerotic heart disease of native coronary artery without angina pectoris: Secondary | ICD-10-CM | POA: Diagnosis not present

## 2012-01-05 DIAGNOSIS — E559 Vitamin D deficiency, unspecified: Secondary | ICD-10-CM | POA: Diagnosis not present

## 2012-01-05 DIAGNOSIS — Z713 Dietary counseling and surveillance: Secondary | ICD-10-CM | POA: Diagnosis not present

## 2012-01-05 DIAGNOSIS — E785 Hyperlipidemia, unspecified: Secondary | ICD-10-CM | POA: Diagnosis not present

## 2012-01-24 DIAGNOSIS — E1142 Type 2 diabetes mellitus with diabetic polyneuropathy: Secondary | ICD-10-CM | POA: Diagnosis not present

## 2012-01-24 DIAGNOSIS — E119 Type 2 diabetes mellitus without complications: Secondary | ICD-10-CM | POA: Diagnosis not present

## 2012-03-20 DIAGNOSIS — E559 Vitamin D deficiency, unspecified: Secondary | ICD-10-CM | POA: Diagnosis not present

## 2012-03-20 DIAGNOSIS — E119 Type 2 diabetes mellitus without complications: Secondary | ICD-10-CM | POA: Diagnosis not present

## 2012-03-20 DIAGNOSIS — I10 Essential (primary) hypertension: Secondary | ICD-10-CM | POA: Diagnosis not present

## 2012-03-20 DIAGNOSIS — G63 Polyneuropathy in diseases classified elsewhere: Secondary | ICD-10-CM | POA: Diagnosis not present

## 2012-03-20 DIAGNOSIS — I2510 Atherosclerotic heart disease of native coronary artery without angina pectoris: Secondary | ICD-10-CM | POA: Diagnosis not present

## 2012-03-20 DIAGNOSIS — E785 Hyperlipidemia, unspecified: Secondary | ICD-10-CM | POA: Diagnosis not present

## 2012-03-20 DIAGNOSIS — M109 Gout, unspecified: Secondary | ICD-10-CM | POA: Diagnosis not present

## 2012-04-03 DIAGNOSIS — I2510 Atherosclerotic heart disease of native coronary artery without angina pectoris: Secondary | ICD-10-CM | POA: Diagnosis not present

## 2012-04-03 DIAGNOSIS — I10 Essential (primary) hypertension: Secondary | ICD-10-CM | POA: Diagnosis not present

## 2012-04-03 DIAGNOSIS — E785 Hyperlipidemia, unspecified: Secondary | ICD-10-CM | POA: Diagnosis not present

## 2012-04-03 DIAGNOSIS — G63 Polyneuropathy in diseases classified elsewhere: Secondary | ICD-10-CM | POA: Diagnosis not present

## 2012-04-03 DIAGNOSIS — M109 Gout, unspecified: Secondary | ICD-10-CM | POA: Diagnosis not present

## 2012-04-03 DIAGNOSIS — E559 Vitamin D deficiency, unspecified: Secondary | ICD-10-CM | POA: Diagnosis not present

## 2012-04-03 DIAGNOSIS — E119 Type 2 diabetes mellitus without complications: Secondary | ICD-10-CM | POA: Diagnosis not present

## 2012-04-03 DIAGNOSIS — M545 Low back pain: Secondary | ICD-10-CM | POA: Diagnosis not present

## 2012-04-17 DIAGNOSIS — I10 Essential (primary) hypertension: Secondary | ICD-10-CM | POA: Diagnosis not present

## 2012-04-17 DIAGNOSIS — I2510 Atherosclerotic heart disease of native coronary artery without angina pectoris: Secondary | ICD-10-CM | POA: Diagnosis not present

## 2012-04-17 DIAGNOSIS — E119 Type 2 diabetes mellitus without complications: Secondary | ICD-10-CM | POA: Diagnosis not present

## 2012-04-17 DIAGNOSIS — M545 Low back pain: Secondary | ICD-10-CM | POA: Diagnosis not present

## 2012-04-17 DIAGNOSIS — E785 Hyperlipidemia, unspecified: Secondary | ICD-10-CM | POA: Diagnosis not present

## 2012-04-17 DIAGNOSIS — E559 Vitamin D deficiency, unspecified: Secondary | ICD-10-CM | POA: Diagnosis not present

## 2012-04-17 DIAGNOSIS — M109 Gout, unspecified: Secondary | ICD-10-CM | POA: Diagnosis not present

## 2012-04-17 DIAGNOSIS — G63 Polyneuropathy in diseases classified elsewhere: Secondary | ICD-10-CM | POA: Diagnosis not present

## 2012-04-20 DIAGNOSIS — M545 Low back pain: Secondary | ICD-10-CM | POA: Diagnosis not present

## 2012-04-20 DIAGNOSIS — M79609 Pain in unspecified limb: Secondary | ICD-10-CM | POA: Diagnosis not present

## 2012-04-20 DIAGNOSIS — M542 Cervicalgia: Secondary | ICD-10-CM | POA: Diagnosis not present

## 2012-04-25 DIAGNOSIS — M545 Low back pain: Secondary | ICD-10-CM | POA: Diagnosis not present

## 2012-04-25 DIAGNOSIS — M79609 Pain in unspecified limb: Secondary | ICD-10-CM | POA: Diagnosis not present

## 2012-04-25 DIAGNOSIS — M542 Cervicalgia: Secondary | ICD-10-CM | POA: Diagnosis not present

## 2012-05-01 DIAGNOSIS — I10 Essential (primary) hypertension: Secondary | ICD-10-CM | POA: Diagnosis not present

## 2012-05-01 DIAGNOSIS — E785 Hyperlipidemia, unspecified: Secondary | ICD-10-CM | POA: Diagnosis not present

## 2012-05-01 DIAGNOSIS — I2510 Atherosclerotic heart disease of native coronary artery without angina pectoris: Secondary | ICD-10-CM | POA: Diagnosis not present

## 2012-05-01 DIAGNOSIS — M545 Low back pain: Secondary | ICD-10-CM | POA: Diagnosis not present

## 2012-05-08 DIAGNOSIS — M545 Low back pain: Secondary | ICD-10-CM | POA: Diagnosis not present

## 2012-05-15 DIAGNOSIS — E119 Type 2 diabetes mellitus without complications: Secondary | ICD-10-CM | POA: Diagnosis not present

## 2012-05-15 DIAGNOSIS — M109 Gout, unspecified: Secondary | ICD-10-CM | POA: Diagnosis not present

## 2012-05-15 DIAGNOSIS — G63 Polyneuropathy in diseases classified elsewhere: Secondary | ICD-10-CM | POA: Diagnosis not present

## 2012-05-15 DIAGNOSIS — I2510 Atherosclerotic heart disease of native coronary artery without angina pectoris: Secondary | ICD-10-CM | POA: Diagnosis not present

## 2012-05-15 DIAGNOSIS — N390 Urinary tract infection, site not specified: Secondary | ICD-10-CM | POA: Diagnosis not present

## 2012-05-15 DIAGNOSIS — M545 Low back pain: Secondary | ICD-10-CM | POA: Diagnosis not present

## 2012-05-15 DIAGNOSIS — I10 Essential (primary) hypertension: Secondary | ICD-10-CM | POA: Diagnosis not present

## 2012-05-15 DIAGNOSIS — N318 Other neuromuscular dysfunction of bladder: Secondary | ICD-10-CM | POA: Diagnosis not present

## 2012-05-15 DIAGNOSIS — E785 Hyperlipidemia, unspecified: Secondary | ICD-10-CM | POA: Diagnosis not present

## 2012-05-17 ENCOUNTER — Other Ambulatory Visit: Payer: Self-pay | Admitting: Internal Medicine

## 2012-05-17 DIAGNOSIS — R9389 Abnormal findings on diagnostic imaging of other specified body structures: Secondary | ICD-10-CM

## 2012-05-22 ENCOUNTER — Ambulatory Visit
Admission: RE | Admit: 2012-05-22 | Discharge: 2012-05-22 | Disposition: A | Discharge status: Home / Self Care | Payer: Medicare Other | Source: Ambulatory Visit | Attending: Internal Medicine | Admitting: Internal Medicine

## 2012-05-22 DIAGNOSIS — I714 Abdominal aortic aneurysm, without rupture: Secondary | ICD-10-CM | POA: Diagnosis not present

## 2012-05-22 DIAGNOSIS — N281 Cyst of kidney, acquired: Secondary | ICD-10-CM | POA: Diagnosis not present

## 2012-05-22 DIAGNOSIS — K7689 Other specified diseases of liver: Secondary | ICD-10-CM | POA: Diagnosis not present

## 2012-05-22 DIAGNOSIS — R9389 Abnormal findings on diagnostic imaging of other specified body structures: Secondary | ICD-10-CM

## 2012-06-21 DIAGNOSIS — M545 Low back pain: Secondary | ICD-10-CM | POA: Diagnosis not present

## 2012-06-21 DIAGNOSIS — Z23 Encounter for immunization: Secondary | ICD-10-CM | POA: Diagnosis not present

## 2012-06-21 DIAGNOSIS — I10 Essential (primary) hypertension: Secondary | ICD-10-CM | POA: Diagnosis not present

## 2012-06-21 DIAGNOSIS — G63 Polyneuropathy in diseases classified elsewhere: Secondary | ICD-10-CM | POA: Diagnosis not present

## 2012-06-21 DIAGNOSIS — R109 Unspecified abdominal pain: Secondary | ICD-10-CM | POA: Diagnosis not present

## 2012-06-21 DIAGNOSIS — N318 Other neuromuscular dysfunction of bladder: Secondary | ICD-10-CM | POA: Diagnosis not present

## 2012-06-21 DIAGNOSIS — I2510 Atherosclerotic heart disease of native coronary artery without angina pectoris: Secondary | ICD-10-CM | POA: Diagnosis not present

## 2012-06-21 DIAGNOSIS — E119 Type 2 diabetes mellitus without complications: Secondary | ICD-10-CM | POA: Diagnosis not present

## 2012-06-21 DIAGNOSIS — E785 Hyperlipidemia, unspecified: Secondary | ICD-10-CM | POA: Diagnosis not present

## 2012-06-23 ENCOUNTER — Emergency Department (HOSPITAL_BASED_OUTPATIENT_CLINIC_OR_DEPARTMENT_OTHER)
Admission: EM | Admit: 2012-06-23 | Discharge: 2012-06-23 | Disposition: A | Discharge status: Home / Self Care | Payer: Medicare Other | Attending: Emergency Medicine | Admitting: Emergency Medicine

## 2012-06-23 ENCOUNTER — Encounter (HOSPITAL_BASED_OUTPATIENT_CLINIC_OR_DEPARTMENT_OTHER): Payer: Self-pay | Admitting: *Deleted

## 2012-06-23 DIAGNOSIS — I10 Essential (primary) hypertension: Secondary | ICD-10-CM | POA: Diagnosis not present

## 2012-06-23 DIAGNOSIS — IMO0001 Reserved for inherently not codable concepts without codable children: Secondary | ICD-10-CM

## 2012-06-23 DIAGNOSIS — I2510 Atherosclerotic heart disease of native coronary artery without angina pectoris: Secondary | ICD-10-CM | POA: Insufficient documentation

## 2012-06-23 DIAGNOSIS — R51 Headache: Secondary | ICD-10-CM | POA: Diagnosis not present

## 2012-06-23 HISTORY — DX: Essential (primary) hypertension: I10

## 2012-06-23 HISTORY — DX: Atherosclerotic heart disease of native coronary artery without angina pectoris: I25.10

## 2012-06-23 NOTE — ED Notes (Signed)
MD at bedside. 

## 2012-06-23 NOTE — ED Provider Notes (Signed)
History     CSN: [id number]  Arrival date & time 06/23/12  1242   First MD Initiated Contact with Patient 06/23/12 1318      Chief Complaint  Patient presents with  . Headache    (Consider location/radiation/quality/duration/timing/severity/associated sxs/prior treatment) Patient is a 77 y.o. male presenting with headaches. The history is provided by the patient.  Headache   He actually denies headache when I have spoken with him. He checked his blood pressure at home and it was very low. Systolic blood pressure was about 56. He checked several times with similar readings. He denies being dizzy or lightheaded and denies any chest pain. Denies nausea, vomiting, diaphoresis. His blood pressure machine is about 5 months old and is not changed the batteries since getting it.  Past Medical History  Diagnosis Date  . Hypertension   . Coronary artery disease     History reviewed. No pertinent past surgical history.  History reviewed. No pertinent family history.  History  Substance Use Topics  . Smoking status: Never Smoker   . Smokeless tobacco: Not on file  . Alcohol Use: No      Review of Systems  Neurological: Positive for headaches.  All other systems reviewed and are negative.    Allergies  Review of patient's allergies indicates no known allergies.  Home Medications   Current Outpatient Rx  Name Route Sig Dispense Refill  . ALLOPURINOL 300 MG PO TABS Oral Take 300 mg by mouth daily.    Marland Kitchen CARVEDILOL 12.5 MG PO TABS Oral Take 12.5 mg by mouth 2 (two) times daily with a meal.    . ESOMEPRAZOLE MAGNESIUM 40 MG PO CPDR Oral Take 40 mg by mouth daily before breakfast.    . EZETIMIBE-SIMVASTATIN 10-80 MG PO TABS Oral Take 1 tablet by mouth at bedtime.    Marland Kitchen GABAPENTIN 300 MG PO CAPS Oral Take 300 mg by mouth 3 (three) times daily.    Marland Kitchen NORTRIPTYLINE HCL 10 MG PO CAPS Oral Take 10 mg by mouth at bedtime.    . TAMSULOSIN HCL 0.4 MG PO CAPS Oral Take 0.4 mg by mouth.      . TELMISARTAN 20 MG PO TABS Oral Take 20 mg by mouth daily.    . TRIAMTERENE 100 MG PO CAPS Oral Take 100 mg by mouth 2 (two) times daily.      BP 108/59  Pulse 64  Temp 97.7 F (36.5 C) (Oral)  Resp 16  Ht 5' 8.5" (1.74 m)  Wt 219 lb (99.338 kg)  BMI 32.81 kg/m2  SpO2 97%  Physical Exam  Nursing note and vitals reviewed. 77year old male, resting comfortably and in no acute distress. Vital signs are normal. Oxygen saturation is 97%, which is normal. Head is normocephalic and atraumatic. PERRLA, EOMI. Fundi show no hemorrhage, exudate, or papilledema. Oropharynx is clear. Neck is nontender and supple without adenopathy or JVD. Back is nontender and there is no CVA tenderness. Lungs are clear without rales, wheezes, or rhonchi. Chest is nontender. Heart has regular rate and rhythm without murmur. Abdomen is soft, flat, nontender without masses or hepatosplenomegaly and peristalsis is normoactive. Extremities have no cyanosis or edema, full range of motion is present. Skin is warm and dry without rash. Neurologic: Mental status is normal, cranial nerves are intact, there are no motor or sensory deficits.   ED Course  Procedures (including critical care time)   1. Normal blood pressure       MDM  Low normal  blood pressure. No evidence of symptomatic hypotension. I reviewed his blood pressure medications and he is on 3 drugs for her blood pressure. I have advised him to monitor his blood pressure at home and follow up with his PCP. At the time he sees his PCP, he should take his blood pressure machine with him to make sure that it is getting accurate readings. If he is consistently running blood pressures as low as they are, he can probably discontinue one or 2 of his blood pressure medications.        Dione Booze, MD 06/23/12 [phone]

## 2012-06-25 ENCOUNTER — Encounter (HOSPITAL_COMMUNITY): Payer: Self-pay | Admitting: Emergency Medicine

## 2012-06-25 ENCOUNTER — Emergency Department (HOSPITAL_COMMUNITY)
Admission: EM | Admit: 2012-06-25 | Discharge: 2012-06-25 | Disposition: A | Discharge status: Home / Self Care | Payer: Medicare Other | Attending: Emergency Medicine | Admitting: Emergency Medicine

## 2012-06-25 DIAGNOSIS — I10 Essential (primary) hypertension: Secondary | ICD-10-CM | POA: Insufficient documentation

## 2012-06-25 DIAGNOSIS — I2510 Atherosclerotic heart disease of native coronary artery without angina pectoris: Secondary | ICD-10-CM | POA: Diagnosis not present

## 2012-06-25 DIAGNOSIS — Z760 Encounter for issue of repeat prescription: Secondary | ICD-10-CM | POA: Diagnosis not present

## 2012-06-25 MED ORDER — MIRTAZAPINE 15 MG PO TABS: 15.0000 mg | ORAL_TABLET | Freq: Every day | ORAL | Status: DC

## 2012-06-25 NOTE — ED Provider Notes (Signed)
History     CSN: [id number]  Arrival date & time 06/25/12  1055   First MD Initiated Contact with Patient 06/25/12 1122      Chief Complaint  Patient presents with  . Medication Refill    (Consider location/radiation/quality/duration/timing/severity/associated sxs/prior treatment) HPI Patient since emergency department because of the medication issue.  Patient, states he is on Remeron and has run out of his medication.  Patient, states, that he will not be up to see his doctor for several weeks.  Patient's here requesting refill on his medication.  Patient denies any other issues at this time.  Patient does, state he has some anxiety. Past Medical History  Diagnosis Date  . Hypertension   . Coronary artery disease     Past Surgical History  Procedure Date  . Hernia repair   . Rotator cuff repair     No family history on file.  History  Substance Use Topics  . Smoking status: Never Smoker   . Smokeless tobacco: Not on file  . Alcohol Use: No      Review of Systems All other systems negative except as documented in the HPI. All pertinent positives and negatives as reviewed in the HPI.  Allergies  Review of patient's allergies indicates no known allergies.  Home Medications   Current Outpatient Rx  Name Route Sig Dispense Refill  . ALLOPURINOL 300 MG PO TABS Oral Take 300 mg by mouth daily.    Marland Kitchen CARVEDILOL 12.5 MG PO TABS Oral Take 12.5 mg by mouth 2 (two) times daily with a meal.    . ESOMEPRAZOLE MAGNESIUM 40 MG PO CPDR Oral Take 40 mg by mouth daily before breakfast.    . EZETIMIBE-SIMVASTATIN 10-80 MG PO TABS Oral Take 1 tablet by mouth at bedtime.    Marland Kitchen GABAPENTIN 300 MG PO CAPS Oral Take 300 mg by mouth 2 (two) times daily.     Marland Kitchen METFORMIN HCL ER 500 MG PO TB24 Oral Take 500 mg by mouth daily with breakfast.    . NITROGLYCERIN 0.4 MG SL SUBL Sublingual Place 0.4 mg under the tongue every 5 (five) minutes as needed. Chest pain    . NORTRIPTYLINE HCL 10 MG PO  CAPS Oral Take 10 mg by mouth at bedtime.    . TAMSULOSIN HCL 0.4 MG PO CAPS Oral Take 0.4 mg by mouth.    . TELMISARTAN-AMLODIPINE 80-5 MG PO TABS Oral Take 1 tablet by mouth daily.    . TELMISARTAN-HCTZ 80-25 MG PO TABS Oral Take 1 tablet by mouth daily.      BP 119/77  Pulse 73  Temp 99.4 F (37.4 C) (Oral)  Resp 25  SpO2 97%  Physical Exam  Constitutional: He is oriented to person, place, and time. He appears well-developed and well-nourished. No distress.  HENT:  Head: Normocephalic and atraumatic.  Eyes: Pupils are equal, round, and reactive to light.  Cardiovascular: Normal rate and regular rhythm.  Exam reveals no gallop and no friction rub.   No murmur heard. Pulmonary/Chest: Effort normal and breath sounds normal. No respiratory distress.  Neurological: He is alert and oriented to person, place, and time.  Skin: Skin is warm and dry. No rash noted.  Psychiatric: He has a normal mood and affect. His behavior is normal. Thought content normal.    ED Course  Procedures (including critical care time)  Patient, that she will need to followup with primary Dr. for further evaluation on his medications.  I advised him that I  will give him a refill until he can see him.  Patient's told to return here as needed   MDM          Carlyle Dolly, PA-C 06/25/12 1223

## 2012-06-25 NOTE — ED Notes (Signed)
Pt presents to ED today requesting med refill for Remeron after being without x1 month.

## 2012-06-25 NOTE — ED Provider Notes (Signed)
Medical screening examination/treatment/procedure(s) were performed by non-physician practitioner and as supervising physician I was immediately available for consultation/collaboration.  Toy Baker, MD 06/25/12 1500

## 2012-07-26 DIAGNOSIS — F0390 Unspecified dementia without behavioral disturbance: Secondary | ICD-10-CM | POA: Diagnosis not present

## 2012-08-04 DIAGNOSIS — R3915 Urgency of urination: Secondary | ICD-10-CM | POA: Diagnosis not present

## 2012-08-04 DIAGNOSIS — E291 Testicular hypofunction: Secondary | ICD-10-CM | POA: Diagnosis not present

## 2012-08-04 DIAGNOSIS — N529 Male erectile dysfunction, unspecified: Secondary | ICD-10-CM | POA: Diagnosis not present

## 2012-08-07 DIAGNOSIS — E785 Hyperlipidemia, unspecified: Secondary | ICD-10-CM | POA: Diagnosis not present

## 2012-08-07 DIAGNOSIS — I10 Essential (primary) hypertension: Secondary | ICD-10-CM | POA: Diagnosis not present

## 2012-08-07 DIAGNOSIS — E119 Type 2 diabetes mellitus without complications: Secondary | ICD-10-CM | POA: Diagnosis not present

## 2012-08-07 DIAGNOSIS — M545 Low back pain: Secondary | ICD-10-CM | POA: Diagnosis not present

## 2012-08-07 DIAGNOSIS — G63 Polyneuropathy in diseases classified elsewhere: Secondary | ICD-10-CM | POA: Diagnosis not present

## 2012-08-07 DIAGNOSIS — M109 Gout, unspecified: Secondary | ICD-10-CM | POA: Diagnosis not present

## 2012-08-07 DIAGNOSIS — I2510 Atherosclerotic heart disease of native coronary artery without angina pectoris: Secondary | ICD-10-CM | POA: Diagnosis not present

## 2012-08-07 DIAGNOSIS — N318 Other neuromuscular dysfunction of bladder: Secondary | ICD-10-CM | POA: Diagnosis not present

## 2012-08-10 DIAGNOSIS — N529 Male erectile dysfunction, unspecified: Secondary | ICD-10-CM | POA: Diagnosis not present

## 2012-08-10 DIAGNOSIS — Z125 Encounter for screening for malignant neoplasm of prostate: Secondary | ICD-10-CM | POA: Diagnosis not present

## 2012-10-04 ENCOUNTER — Encounter (HOSPITAL_BASED_OUTPATIENT_CLINIC_OR_DEPARTMENT_OTHER): Payer: Self-pay | Admitting: *Deleted

## 2012-10-04 ENCOUNTER — Emergency Department (HOSPITAL_BASED_OUTPATIENT_CLINIC_OR_DEPARTMENT_OTHER)
Admission: EM | Admit: 2012-10-04 | Discharge: 2012-10-04 | Disposition: A | Discharge status: Home / Self Care | Payer: Medicare Other | Attending: Emergency Medicine | Admitting: Emergency Medicine

## 2012-10-04 DIAGNOSIS — N390 Urinary tract infection, site not specified: Secondary | ICD-10-CM | POA: Diagnosis not present

## 2012-10-04 DIAGNOSIS — I2510 Atherosclerotic heart disease of native coronary artery without angina pectoris: Secondary | ICD-10-CM | POA: Diagnosis not present

## 2012-10-04 DIAGNOSIS — E119 Type 2 diabetes mellitus without complications: Secondary | ICD-10-CM | POA: Insufficient documentation

## 2012-10-04 DIAGNOSIS — I10 Essential (primary) hypertension: Secondary | ICD-10-CM | POA: Insufficient documentation

## 2012-10-04 DIAGNOSIS — I252 Old myocardial infarction: Secondary | ICD-10-CM | POA: Diagnosis not present

## 2012-10-04 DIAGNOSIS — Z87891 Personal history of nicotine dependence: Secondary | ICD-10-CM | POA: Insufficient documentation

## 2012-10-04 DIAGNOSIS — K219 Gastro-esophageal reflux disease without esophagitis: Secondary | ICD-10-CM | POA: Insufficient documentation

## 2012-10-04 DIAGNOSIS — Z79899 Other long term (current) drug therapy: Secondary | ICD-10-CM | POA: Diagnosis not present

## 2012-10-04 HISTORY — DX: Reserved for inherently not codable concepts without codable children: IMO0001

## 2012-10-04 HISTORY — DX: Old myocardial infarction: I25.2

## 2012-10-04 HISTORY — DX: Type 2 diabetes mellitus without complications: E11.9

## 2012-10-04 HISTORY — DX: Gastro-esophageal reflux disease without esophagitis: K21.9

## 2012-10-04 LAB — URINALYSIS, ROUTINE W REFLEX MICROSCOPIC
Hgb urine dipstick: NEGATIVE
Nitrite: NEGATIVE
Specific Gravity, Urine: 1.013 (ref 1.005–1.030)
Urobilinogen, UA: 0.2 mg/dL (ref 0.0–1.0)
pH: 6 (ref 5.0–8.0)

## 2012-10-04 LAB — URINE MICROSCOPIC-ADD ON

## 2012-10-04 MED ORDER — TELMISARTAN-HCTZ 80-25 MG PO TABS: 1.0000 | ORAL_TABLET | Freq: Every day | ORAL | Status: DC

## 2012-10-04 MED ORDER — CEPHALEXIN 500 MG PO CAPS: 500.0000 mg | ORAL_CAPSULE | Freq: Four times a day (QID) | ORAL | Status: DC

## 2012-10-04 MED ORDER — CARVEDILOL 12.5 MG PO TABS: 12.5000 mg | ORAL_TABLET | Freq: Two times a day (BID) | ORAL | Status: DC

## 2012-10-04 NOTE — ED Provider Notes (Signed)
History     CSN: [id number]  Arrival date & time 10/04/12  1242   First MD Initiated Contact with Patient 10/04/12 1310      Chief Complaint  Patient presents with  . Urinary Tract Infection    (Consider location/radiation/quality/duration/timing/severity/associated sxs/prior treatment) HPI Patient states urinary frequency especially at night up to six times per night for four weeks.  BS running ok at 121 high yesterday.  Patient with some nausea, no fever or chills.  Patient with history of uti 2-3 years ago 3-4 times.  PMD is Dr. Cloyd Stagers Bonsu,urology Dr Retta Diones, h.o. Ed, followed psa.   Past Medical History  Diagnosis Date  . Hypertension   . Coronary artery disease   . Diabetes mellitus without complication   . MI, old   . Reflux     Past Surgical History  Procedure Date  . Hernia repair   . Rotator cuff repair     No family history on file.  History  Substance Use Topics  . Smoking status: Former Games developer  . Smokeless tobacco: Not on file  . Alcohol Use: No      Review of Systems  Constitutional: Negative.   HENT: Negative.   Eyes: Negative.   Respiratory: Negative.   Cardiovascular: Negative.   Gastrointestinal: Negative.   Genitourinary: Positive for dysuria, frequency and difficulty urinating.  Musculoskeletal: Negative.   Neurological: Negative.   Hematological: Negative.   Psychiatric/Behavioral: Negative.     Allergies  Review of patient's allergies indicates no known allergies.  Home Medications   Current Outpatient Rx  Name  Route  Sig  Dispense  Refill  . ALLOPURINOL 300 MG PO TABS   Oral   Take 300 mg by mouth daily.         Marland Kitchen CARVEDILOL 12.5 MG PO TABS   Oral   Take 12.5 mg by mouth 2 (two) times daily with a meal.         . ESOMEPRAZOLE MAGNESIUM 40 MG PO CPDR   Oral   Take 40 mg by mouth daily before breakfast.         . EZETIMIBE-SIMVASTATIN 10-80 MG PO TABS   Oral   Take 1 tablet by mouth at bedtime.         Marland Kitchen  GABAPENTIN 300 MG PO CAPS   Oral   Take 300 mg by mouth 2 (two) times daily.          Marland Kitchen METFORMIN HCL ER 500 MG PO TB24   Oral   Take 500 mg by mouth daily with breakfast.         . MIRTAZAPINE 15 MG PO TABS   Oral   Take 1 tablet (15 mg total) by mouth at bedtime.   30 tablet   0   . NITROGLYCERIN 0.4 MG SL SUBL   Sublingual   Place 0.4 mg under the tongue every 5 (five) minutes as needed. Chest pain         . NORTRIPTYLINE HCL 10 MG PO CAPS   Oral   Take 10 mg by mouth at bedtime.         . TAMSULOSIN HCL 0.4 MG PO CAPS   Oral   Take 0.4 mg by mouth.         . TELMISARTAN-AMLODIPINE 80-5 MG PO TABS   Oral   Take 1 tablet by mouth daily.         . TELMISARTAN-HCTZ 80-25 MG PO TABS   Oral  Take 1 tablet by mouth daily.           BP 199/97  Pulse 56  Temp 98.2 F (36.8 C) (Oral)  Resp 20  Ht 5\' 9"  (1.753 m)  Wt 220 lb (99.791 kg)  BMI 32.49 kg/m2  SpO2 96%  Physical Exam  Nursing note and vitals reviewed. Constitutional: He is oriented to person, place, and time. He appears well-developed and well-nourished.  HENT:  Head: Normocephalic and atraumatic.  Right Ear: External ear normal.  Left Ear: External ear normal.  Nose: Nose normal.  Mouth/Throat: Oropharynx is clear and moist.  Eyes: Conjunctivae normal are normal. Pupils are equal, round, and reactive to light.  Neck: Normal range of motion. Neck supple.  Cardiovascular: Normal rate, normal heart sounds and intact distal pulses.   Pulmonary/Chest: Effort normal and breath sounds normal.  Abdominal: Soft. Bowel sounds are normal. He exhibits no distension. There is no tenderness.  Genitourinary: Penis normal.  Musculoskeletal: Normal range of motion.  Neurological: He is alert and oriented to person, place, and time. He has normal reflexes.  Skin: Skin is warm and dry.  Psychiatric: He has a normal mood and affect. His behavior is normal. Judgment and thought content normal.    ED  Course  Procedures (including critical care time)   Labs Reviewed  URINALYSIS, ROUTINE W REFLEX MICROSCOPIC   No results found.   No diagnosis found.    MDM  Patient is hypertensive here but has not been taking his medicine.  Patient advised to take medicine as prescribed and follow up with Dr. Julio Sicks for bp recheck 1-2 days.  Patient with bacteriuria and will have urine cultured and treated with keflex.  He is advised of this.        Hilario Quarry, MD 10/04/12 1434

## 2012-10-06 DIAGNOSIS — G63 Polyneuropathy in diseases classified elsewhere: Secondary | ICD-10-CM | POA: Diagnosis not present

## 2012-10-06 DIAGNOSIS — E785 Hyperlipidemia, unspecified: Secondary | ICD-10-CM | POA: Diagnosis not present

## 2012-10-06 DIAGNOSIS — M545 Low back pain: Secondary | ICD-10-CM | POA: Diagnosis not present

## 2012-10-06 DIAGNOSIS — I10 Essential (primary) hypertension: Secondary | ICD-10-CM | POA: Diagnosis not present

## 2012-10-06 DIAGNOSIS — M109 Gout, unspecified: Secondary | ICD-10-CM | POA: Diagnosis not present

## 2012-10-06 DIAGNOSIS — E119 Type 2 diabetes mellitus without complications: Secondary | ICD-10-CM | POA: Diagnosis not present

## 2012-10-06 DIAGNOSIS — N318 Other neuromuscular dysfunction of bladder: Secondary | ICD-10-CM | POA: Diagnosis not present

## 2012-10-06 DIAGNOSIS — I2510 Atherosclerotic heart disease of native coronary artery without angina pectoris: Secondary | ICD-10-CM | POA: Diagnosis not present

## 2012-10-06 LAB — URINE CULTURE

## 2012-10-30 DIAGNOSIS — N318 Other neuromuscular dysfunction of bladder: Secondary | ICD-10-CM | POA: Diagnosis not present

## 2012-10-30 DIAGNOSIS — M109 Gout, unspecified: Secondary | ICD-10-CM | POA: Diagnosis not present

## 2012-10-30 DIAGNOSIS — E119 Type 2 diabetes mellitus without complications: Secondary | ICD-10-CM | POA: Diagnosis not present

## 2012-10-30 DIAGNOSIS — I10 Essential (primary) hypertension: Secondary | ICD-10-CM | POA: Diagnosis not present

## 2012-10-30 DIAGNOSIS — M545 Low back pain: Secondary | ICD-10-CM | POA: Diagnosis not present

## 2012-10-30 DIAGNOSIS — I2510 Atherosclerotic heart disease of native coronary artery without angina pectoris: Secondary | ICD-10-CM | POA: Diagnosis not present

## 2012-10-30 DIAGNOSIS — G63 Polyneuropathy in diseases classified elsewhere: Secondary | ICD-10-CM | POA: Diagnosis not present

## 2012-10-30 DIAGNOSIS — E785 Hyperlipidemia, unspecified: Secondary | ICD-10-CM | POA: Diagnosis not present

## 2012-11-06 DIAGNOSIS — G63 Polyneuropathy in diseases classified elsewhere: Secondary | ICD-10-CM | POA: Diagnosis not present

## 2012-11-06 DIAGNOSIS — I2510 Atherosclerotic heart disease of native coronary artery without angina pectoris: Secondary | ICD-10-CM | POA: Diagnosis not present

## 2012-11-06 DIAGNOSIS — M545 Low back pain: Secondary | ICD-10-CM | POA: Diagnosis not present

## 2012-11-06 DIAGNOSIS — N318 Other neuromuscular dysfunction of bladder: Secondary | ICD-10-CM | POA: Diagnosis not present

## 2012-11-06 DIAGNOSIS — I10 Essential (primary) hypertension: Secondary | ICD-10-CM | POA: Diagnosis not present

## 2012-11-06 DIAGNOSIS — Z0000 Encounter for general adult medical examination without abnormal findings: Secondary | ICD-10-CM | POA: Diagnosis not present

## 2012-11-06 DIAGNOSIS — Z0110 Encounter for examination of ears and hearing without abnormal findings: Secondary | ICD-10-CM | POA: Diagnosis not present

## 2012-11-06 DIAGNOSIS — E1159 Type 2 diabetes mellitus with other circulatory complications: Secondary | ICD-10-CM | POA: Diagnosis not present

## 2012-11-06 DIAGNOSIS — E119 Type 2 diabetes mellitus without complications: Secondary | ICD-10-CM | POA: Diagnosis not present

## 2012-11-06 DIAGNOSIS — E785 Hyperlipidemia, unspecified: Secondary | ICD-10-CM | POA: Diagnosis not present

## 2012-11-23 DIAGNOSIS — F0390 Unspecified dementia without behavioral disturbance: Secondary | ICD-10-CM | POA: Diagnosis not present

## 2012-12-19 DIAGNOSIS — G63 Polyneuropathy in diseases classified elsewhere: Secondary | ICD-10-CM | POA: Diagnosis not present

## 2012-12-19 DIAGNOSIS — I2510 Atherosclerotic heart disease of native coronary artery without angina pectoris: Secondary | ICD-10-CM | POA: Diagnosis not present

## 2012-12-19 DIAGNOSIS — N318 Other neuromuscular dysfunction of bladder: Secondary | ICD-10-CM | POA: Diagnosis not present

## 2012-12-19 DIAGNOSIS — F329 Major depressive disorder, single episode, unspecified: Secondary | ICD-10-CM | POA: Diagnosis not present

## 2012-12-19 DIAGNOSIS — M545 Low back pain: Secondary | ICD-10-CM | POA: Diagnosis not present

## 2012-12-19 DIAGNOSIS — I10 Essential (primary) hypertension: Secondary | ICD-10-CM | POA: Diagnosis not present

## 2012-12-19 DIAGNOSIS — N19 Unspecified kidney failure: Secondary | ICD-10-CM | POA: Diagnosis not present

## 2012-12-19 DIAGNOSIS — E119 Type 2 diabetes mellitus without complications: Secondary | ICD-10-CM | POA: Diagnosis not present

## 2013-01-25 DIAGNOSIS — T7840XA Allergy, unspecified, initial encounter: Secondary | ICD-10-CM | POA: Diagnosis not present

## 2013-01-25 DIAGNOSIS — F329 Major depressive disorder, single episode, unspecified: Secondary | ICD-10-CM | POA: Diagnosis not present

## 2013-01-25 DIAGNOSIS — M109 Gout, unspecified: Secondary | ICD-10-CM | POA: Diagnosis not present

## 2013-01-25 DIAGNOSIS — I10 Essential (primary) hypertension: Secondary | ICD-10-CM | POA: Diagnosis not present

## 2013-01-25 DIAGNOSIS — E785 Hyperlipidemia, unspecified: Secondary | ICD-10-CM | POA: Diagnosis not present

## 2013-01-25 DIAGNOSIS — E119 Type 2 diabetes mellitus without complications: Secondary | ICD-10-CM | POA: Diagnosis not present

## 2013-01-25 DIAGNOSIS — N318 Other neuromuscular dysfunction of bladder: Secondary | ICD-10-CM | POA: Diagnosis not present

## 2013-01-25 DIAGNOSIS — N19 Unspecified kidney failure: Secondary | ICD-10-CM | POA: Diagnosis not present

## 2013-04-16 DIAGNOSIS — E119 Type 2 diabetes mellitus without complications: Secondary | ICD-10-CM | POA: Diagnosis not present

## 2013-04-16 DIAGNOSIS — M109 Gout, unspecified: Secondary | ICD-10-CM | POA: Diagnosis not present

## 2013-04-16 DIAGNOSIS — M545 Low back pain: Secondary | ICD-10-CM | POA: Diagnosis not present

## 2013-04-16 DIAGNOSIS — N19 Unspecified kidney failure: Secondary | ICD-10-CM | POA: Diagnosis not present

## 2013-04-16 DIAGNOSIS — N318 Other neuromuscular dysfunction of bladder: Secondary | ICD-10-CM | POA: Diagnosis not present

## 2013-04-16 DIAGNOSIS — F411 Generalized anxiety disorder: Secondary | ICD-10-CM | POA: Diagnosis not present

## 2013-04-16 DIAGNOSIS — F329 Major depressive disorder, single episode, unspecified: Secondary | ICD-10-CM | POA: Diagnosis not present

## 2013-04-16 DIAGNOSIS — T7840XA Allergy, unspecified, initial encounter: Secondary | ICD-10-CM | POA: Diagnosis not present

## 2013-04-16 DIAGNOSIS — E785 Hyperlipidemia, unspecified: Secondary | ICD-10-CM | POA: Diagnosis not present

## 2013-04-16 DIAGNOSIS — F3289 Other specified depressive episodes: Secondary | ICD-10-CM | POA: Diagnosis not present

## 2013-04-22 ENCOUNTER — Emergency Department (HOSPITAL_COMMUNITY)
Admission: EM | Admit: 2013-04-22 | Discharge: 2013-04-23 | Disposition: A | Discharge status: Home / Self Care | Payer: Medicare Other | Attending: Emergency Medicine | Admitting: Emergency Medicine

## 2013-04-22 ENCOUNTER — Encounter (HOSPITAL_COMMUNITY): Payer: Self-pay

## 2013-04-22 DIAGNOSIS — Z87891 Personal history of nicotine dependence: Secondary | ICD-10-CM | POA: Diagnosis not present

## 2013-04-22 DIAGNOSIS — I2510 Atherosclerotic heart disease of native coronary artery without angina pectoris: Secondary | ICD-10-CM | POA: Diagnosis not present

## 2013-04-22 DIAGNOSIS — K219 Gastro-esophageal reflux disease without esophagitis: Secondary | ICD-10-CM | POA: Diagnosis not present

## 2013-04-22 DIAGNOSIS — Z79899 Other long term (current) drug therapy: Secondary | ICD-10-CM | POA: Diagnosis not present

## 2013-04-22 DIAGNOSIS — H9190 Unspecified hearing loss, unspecified ear: Secondary | ICD-10-CM | POA: Insufficient documentation

## 2013-04-22 DIAGNOSIS — F1021 Alcohol dependence, in remission: Secondary | ICD-10-CM | POA: Insufficient documentation

## 2013-04-22 DIAGNOSIS — F329 Major depressive disorder, single episode, unspecified: Secondary | ICD-10-CM | POA: Diagnosis not present

## 2013-04-22 DIAGNOSIS — F3289 Other specified depressive episodes: Secondary | ICD-10-CM | POA: Insufficient documentation

## 2013-04-22 DIAGNOSIS — I10 Essential (primary) hypertension: Secondary | ICD-10-CM | POA: Diagnosis not present

## 2013-04-22 DIAGNOSIS — F32A Depression, unspecified: Secondary | ICD-10-CM

## 2013-04-22 DIAGNOSIS — I252 Old myocardial infarction: Secondary | ICD-10-CM | POA: Diagnosis not present

## 2013-04-22 DIAGNOSIS — E119 Type 2 diabetes mellitus without complications: Secondary | ICD-10-CM | POA: Diagnosis not present

## 2013-04-22 HISTORY — DX: Depression, unspecified: F32.A

## 2013-04-22 HISTORY — DX: Major depressive disorder, single episode, unspecified: F32.9

## 2013-04-22 LAB — CBC WITH DIFFERENTIAL/PLATELET
HCT: 41.5 % (ref 39.0–52.0)
Hemoglobin: 14 g/dL (ref 13.0–17.0)
Lymphocytes Relative: 15 % (ref 12–46)
Lymphs Abs: 1.5 10*3/uL (ref 0.7–4.0)
MCV: 86.5 fL (ref 78.0–100.0)
Monocytes Absolute: 0.6 10*3/uL (ref 0.1–1.0)
Monocytes Relative: 6 % (ref 3–12)
Neutro Abs: 7.4 10*3/uL (ref 1.7–7.7)
WBC: 9.8 10*3/uL (ref 4.0–10.5)

## 2013-04-22 LAB — RAPID URINE DRUG SCREEN, HOSP PERFORMED
Barbiturates: NOT DETECTED
Cocaine: NOT DETECTED
Opiates: NOT DETECTED

## 2013-04-22 LAB — COMPREHENSIVE METABOLIC PANEL
AST: 29 U/L (ref 0–37)
BUN: 23 mg/dL (ref 6–23)
CO2: 27 mEq/L (ref 19–32)
Chloride: 98 mEq/L (ref 96–112)
Creatinine, Ser: 1.26 mg/dL (ref 0.50–1.35)
GFR calc Af Amer: 62 mL/min — ABNORMAL LOW (ref >90)
GFR calc non Af Amer: 54 mL/min — ABNORMAL LOW (ref >90)
Glucose, Bld: 114 mg/dL — ABNORMAL HIGH (ref 70–99)
Total Bilirubin: 0.5 mg/dL (ref 0.3–1.2)

## 2013-04-22 MED ORDER — METFORMIN HCL ER 500 MG PO TB24
500.0000 mg | ORAL_TABLET | Freq: Every day | ORAL | Status: DC
  Administered 2013-04-23: 500 mg via ORAL
  Filled 2013-04-22 (×2): qty 1

## 2013-04-22 MED ORDER — DARIFENACIN HYDROBROMIDE ER 7.5 MG PO TB24
7.5000 mg | ORAL_TABLET | Freq: Every day | ORAL | Status: DC
  Administered 2013-04-22: 7.5 mg via ORAL
  Filled 2013-04-22 (×2): qty 1

## 2013-04-22 MED ORDER — COLCHICINE 0.6 MG PO TABS
0.6000 mg | ORAL_TABLET | Freq: Every day | ORAL | Status: DC
  Administered 2013-04-22: 0.6 mg via ORAL
  Filled 2013-04-22 (×2): qty 1

## 2013-04-22 MED ORDER — HYDROXYZINE HCL 25 MG PO TABS
50.0000 mg | ORAL_TABLET | Freq: Every day | ORAL | Status: DC
  Administered 2013-04-22: 50 mg via ORAL
  Filled 2013-04-22 (×2): qty 1

## 2013-04-22 MED ORDER — TELMISARTAN-HCTZ 80-25 MG PO TABS: 1.0000 | ORAL_TABLET | Freq: Every day | ORAL | Status: DC

## 2013-04-22 MED ORDER — MIRTAZAPINE 30 MG PO TABS
30.0000 mg | ORAL_TABLET | Freq: Every day | ORAL | Status: DC
  Administered 2013-04-22: 30 mg via ORAL
  Filled 2013-04-22: qty 1

## 2013-04-22 MED ORDER — HYDROCHLOROTHIAZIDE 25 MG PO TABS
25.0000 mg | ORAL_TABLET | Freq: Every day | ORAL | Status: DC
  Filled 2013-04-22: qty 1

## 2013-04-22 MED ORDER — CARVEDILOL 12.5 MG PO TABS
12.5000 mg | ORAL_TABLET | Freq: Two times a day (BID) | ORAL | Status: DC
  Administered 2013-04-22 – 2013-04-23 (×2): 12.5 mg via ORAL
  Filled 2013-04-22 (×4): qty 1

## 2013-04-22 MED ORDER — IRBESARTAN 300 MG PO TABS
300.0000 mg | ORAL_TABLET | Freq: Every day | ORAL | Status: DC
  Filled 2013-04-22: qty 1

## 2013-04-22 MED ORDER — EZETIMIBE-SIMVASTATIN 10-80 MG PO TABS
1.0000 | ORAL_TABLET | Freq: Every day | ORAL | Status: DC
  Administered 2013-04-22: 1 via ORAL
  Filled 2013-04-22 (×2): qty 1

## 2013-04-22 MED ORDER — DULOXETINE HCL 30 MG PO CPEP
30.0000 mg | ORAL_CAPSULE | Freq: Every day | ORAL | Status: DC
  Administered 2013-04-22: 30 mg via ORAL
  Filled 2013-04-22 (×2): qty 1

## 2013-04-22 MED ORDER — TAMSULOSIN HCL 0.4 MG PO CAPS
0.4000 mg | ORAL_CAPSULE | Freq: Every day | ORAL | Status: DC
  Administered 2013-04-22: 0.4 mg via ORAL
  Filled 2013-04-22 (×2): qty 1

## 2013-04-22 MED ORDER — TELMISARTAN-HCTZ 80-25 MG PO TABS
1.0000 | ORAL_TABLET | Freq: Every day | ORAL | Status: DC
Start: 2013-04-22 — End: 2013-04-22

## 2013-04-22 NOTE — Consult Note (Signed)
Reason for Consult:Pt came to ER c/o 2 week hx of increasing depression related to increasing inability to take care of wife of 53 yrs.She had a stroke 2 years ago and has recently become bed ridden.He reports his inability to meet her requests at home have led her to comment that she would be better off in a nursing home.He is not suicidal.He is not homicidal.He feels unsupported by present MH provider stating he has been unable to to be seen except for annual FU visits despite recent calls to be seen.He expresses desire to see [facility] MD who treated him on admission in 2009. Referring Physician: ED MD  Austin Sawyer is an 78 y.o. male.  HPI: Please see ED admission note and reason for consult  Past Medical History  Diagnosis Date  . Hypertension   . Coronary artery disease   . Diabetes mellitus without complication   . MI, old   . Reflux     Past Surgical History  Procedure Laterality Date  . Hernia repair    . Rotator cuff repair      History reviewed. No pertinent family history.  Social History:  reports that he has quit smoking. He does not have any smokeless tobacco history on file. He reports that he does not drink alcohol or use illicit drugs.  Allergies: No Known Allergies  Medications: I have reviewed the patient's current medications.  Results for orders placed during the hospital encounter of 04/22/13 (from the past 48 hour(s))  URINE RAPID DRUG SCREEN (HOSP PERFORMED)     Status: None   Collection Time    04/22/13 10:34 AM      Result Value Range   Opiates NONE DETECTED  NONE DETECTED   Cocaine NONE DETECTED  NONE DETECTED   Benzodiazepines NONE DETECTED  NONE DETECTED   Amphetamines NONE DETECTED  NONE DETECTED   Tetrahydrocannabinol NONE DETECTED  NONE DETECTED   Barbiturates NONE DETECTED  NONE DETECTED   Comment:            DRUG SCREEN FOR MEDICAL PURPOSES     ONLY.  IF CONFIRMATION IS NEEDED     FOR ANY PURPOSE, NOTIFY LAB     WITHIN 5 DAYS.              LOWEST DETECTABLE LIMITS     FOR URINE DRUG SCREEN     Drug Class       Cutoff (ng/mL)     Amphetamine      1000     Barbiturate      200     Benzodiazepine   200     Tricyclics       300     Opiates          300     Cocaine          300     THC              50  CBC WITH DIFFERENTIAL     Status: None   Collection Time    04/22/13 10:39 AM      Result Value Range   WBC 9.8  4.0 - 10.5 K/uL   RBC 4.80  4.22 - 5.81 MIL/uL   Hemoglobin 14.0  13.0 - 17.0 g/dL   HCT 16.1  09.6 - 04.5 %   MCV 86.5  78.0 - 100.0 fL   MCH 29.2  26.0 - 34.0 pg   MCHC 33.7  30.0 - 36.0 g/dL  RDW 13.9  11.5 - 15.5 %   Platelets 176  150 - 400 K/uL   Neutrophils Relative % 75  43 - 77 %   Neutro Abs 7.4  1.7 - 7.7 K/uL   Lymphocytes Relative 15  12 - 46 %   Lymphs Abs 1.5  0.7 - 4.0 K/uL   Monocytes Relative 6  3 - 12 %   Monocytes Absolute 0.6  0.1 - 1.0 K/uL   Eosinophils Relative 4  0 - 5 %   Eosinophils Absolute 0.3  0.0 - 0.7 K/uL   Basophils Relative 0  0 - 1 %   Basophils Absolute 0.0  0.0 - 0.1 K/uL  COMPREHENSIVE METABOLIC PANEL     Status: Abnormal   Collection Time    04/22/13 10:39 AM      Result Value Range   Sodium 135  135 - 145 mEq/L   Potassium 4.1  3.5 - 5.1 mEq/L   Chloride 98  96 - 112 mEq/L   CO2 27  19 - 32 mEq/L   Glucose, Bld 114 (*) 70 - 99 mg/dL   BUN 23  6 - 23 mg/dL   Creatinine, Ser 1.61  0.50 - 1.35 mg/dL   Calcium 9.1  8.4 - 09.6 mg/dL   Total Protein 7.5  6.0 - 8.3 g/dL   Albumin 3.9  3.5 - 5.2 g/dL   AST 29  0 - 37 U/L   ALT 33  0 - 53 U/L   Alkaline Phosphatase 54  39 - 117 U/L   Total Bilirubin 0.5  0.3 - 1.2 mg/dL   GFR calc non Af Amer 54 (*) >90 mL/min   GFR calc Af Amer 62 (*) >90 mL/min   Comment:            The eGFR has been calculated     using the CKD EPI equation.     This calculation has not been     validated in all clinical     situations.     eGFR's persistently     <90 mL/min signify     possible Chronic Kidney Disease.  ETHANOL      Status: None   Collection Time    04/22/13 10:39 AM      Result Value Range   Alcohol, Ethyl (B) <11  0 - 11 mg/dL   Comment:            LOWEST DETECTABLE LIMIT FOR     SERUM ALCOHOL IS 11 mg/dL     FOR MEDICAL PURPOSES ONLY    No results found.  Review of Systems  Constitutional:       Obese  HENT: Negative.   Eyes:       Wears Glasses  Respiratory: Negative.   Cardiovascular:       Distant MI hx Alcoholic Cadiomyopathy hx with CHF Hyperlipidemia Obesity with sleep apnea  Gastrointestinal: Positive for heartburn (Hx GERD with Barrett's ).  Genitourinary: Positive for frequency (hx BPH and overactive bladder).  Musculoskeletal: Positive for joint pain (Has Gout).  Skin: Negative.   Neurological: Negative.        Hx old lacunar infarct without sequellae  Endo/Heme/Allergies: Negative.        AODM II  Psychiatric/Behavioral: Positive for depression. Negative for suicidal ideas, hallucinations and substance abuse (ETOH Dependence in remission). The patient is not nervous/anxious and does not have insomnia.    Blood pressure 161/86, pulse 60, temperature 98.7 F (37.1 C), temperature  source Oral, resp. rate 16, height 5' 8.5" (1.74 m), weight 99.791 kg (220 lb), SpO2 96.00%. Physical Exam  Constitutional: He is oriented to person, place, and time. He appears well-developed.  Obese  HENT:  Head: Normocephalic and atraumatic.  Right Ear: Decreased hearing is noted.  Left Ear: Decreased hearing is noted.  Eyes: Conjunctivae and EOM are normal. Pupils are equal, round, and reactive to light. Right eye exhibits no discharge. Left eye exhibits no discharge. No scleral icterus.  Glasses on  Neck: Normal range of motion.  Cardiovascular: Normal rate and regular rhythm.   Respiratory: Effort normal and breath sounds normal.  GI:  Central obesity  Genitourinary:  Deferred  Musculoskeletal: Normal range of motion.  No complaints of acute gout   Neurological: He is alert and  oriented to person, place, and time. He has normal reflexes.  Skin: Skin is warm and dry.  Psychiatric: His speech is normal and behavior is normal. Judgment and thought content normal. Thought content is not delusional. Cognition and memory are normal. He exhibits a depressed mood. He expresses no homicidal and no suicidal ideation. He expresses no suicidal plans and no homicidal plans.  Depressed over inability to care for wife of 53 years in declining health since her stroke 2 years ago she has become nonambulatory recently  Says he doesn't feel supported/listened to at depth by current MH Provider -says he has been unable to see other than for yearly appt.Requesting referral to MD who treated him at [facility] : [facility] (Dr Dub Mikes) 2009  Denies any homicidal or suicidal thoughts    Assessment/Plan: A- Axis I-Depression                Alcohol Dependence in Remission     Axis II- Deferred     Axis III-See problem list     Axis IV- Lives alone with wife in failing health who is unable to ambulate/care for herself     Axis V- GAF 60 P- Pt to FU with wife's PCP re Nursing Home Placement and ? Additional services      Contact information for Dr Dub Mikes provided      Contact Information for [facility] provided      Pt may leave in AM   Austin Sawyer E 04/22/2013, 10:03 PM

## 2013-04-22 NOTE — ED Notes (Signed)
Patient changed into blue scrubs, wanded by security and belongings bag x 1. Patient gave his watch to this son.

## 2013-04-22 NOTE — ED Notes (Signed)
Pt denies SI/HI/AVH.  Pt states "I'm depressed because of problems that I'm having at home.  I'm taking care of my wife.  She had diabetes and she's had a stroke.  I'm realizing that I can't be her caregiver.  It's too much on me.  I'm going to have to put her in a nursing home.  She told me to put her in one, but I thought that I could handle it.  I'm realizing that I can't and it bothers me."  Support and encouragement given to patient.  Advised him on plan of care.

## 2013-04-22 NOTE — ED Provider Notes (Signed)
History    CSN: [id number] Arrival date & time 04/22/13  6578  First MD Initiated Contact with Patient 04/22/13 1001     Chief Complaint  Patient presents with  . Medical Clearance    depression   (Consider location/radiation/quality/duration/timing/severity/associated sxs/prior Treatment) HPI Comments: Patient presents to ER for help with depression. Patient has a history of depression, currently taking Remeron. Patient reports that he has been on that medication for about 5 years, does not think it is helping. If reportedly has had progressively worsening depression. He has not been sleeping well, feels hopeless and helpless. He is not homicidal or suicidal. He feels empty inside and has had some difficulty with concentration and has had some memory loss. Denies any other illness currently.  Past Medical History  Diagnosis Date  . Hypertension   . Coronary artery disease   . Diabetes mellitus without complication   . MI, old   . Reflux    Past Surgical History  Procedure Laterality Date  . Hernia repair    . Rotator cuff repair     No family history on file. History  Substance Use Topics  . Smoking status: Former Games developer  . Smokeless tobacco: Not on file  . Alcohol Use: No    Review of Systems  Psychiatric/Behavioral: Positive for sleep disturbance and dysphoric mood. Negative for suicidal ideas, hallucinations and self-injury.  All other systems reviewed and are negative.    Allergies  Review of patient's allergies indicates no known allergies.  Home Medications   Current Outpatient Rx  Name  Route  Sig  Dispense  Refill  . allopurinol (ZYLOPRIM) 300 MG tablet   Oral   Take 300 mg by mouth daily.         . carvedilol (COREG) 12.5 MG tablet   Oral   Take 1 tablet (12.5 mg total) by mouth 2 (two) times daily with a meal.   30 tablet   0   . cephALEXin (KEFLEX) 500 MG capsule   Oral   Take 1 capsule (500 mg total) by mouth 4 (four) times daily.   28  capsule   0   . esomeprazole (NEXIUM) 40 MG capsule   Oral   Take 40 mg by mouth daily before breakfast.         . ezetimibe-simvastatin (VYTORIN) 10-80 MG per tablet   Oral   Take 1 tablet by mouth at bedtime.         . gabapentin (NEURONTIN) 300 MG capsule   Oral   Take 300 mg by mouth 2 (two) times daily.          . metFORMIN (GLUCOPHAGE-XR) 500 MG 24 hr tablet   Oral   Take 500 mg by mouth daily with breakfast.         . mirtazapine (REMERON) 15 MG tablet   Oral   Take 1 tablet (15 mg total) by mouth at bedtime.   30 tablet   0   . nitroGLYCERIN (NITROSTAT) 0.4 MG SL tablet   Sublingual   Place 0.4 mg under the tongue every 5 (five) minutes as needed. Chest pain         . nortriptyline (PAMELOR) 10 MG capsule   Oral   Take 10 mg by mouth at bedtime.         . Tamsulosin HCl (FLOMAX) 0.4 MG CAPS   Oral   Take 0.4 mg by mouth.         . Telmisartan-Amlodipine 80-5  MG TABS   Oral   Take 1 tablet by mouth daily.         Marland Kitchen telmisartan-hydrochlorothiazide (MICARDIS HCT) 80-25 MG per tablet   Oral   Take 1 tablet by mouth daily.   30 tablet   0    There were no vitals taken for this visit. Physical Exam  Constitutional: He is oriented to person, place, and time. He appears well-developed and well-nourished. No distress.  HENT:  Head: Normocephalic and atraumatic.  Right Ear: Hearing normal.  Left Ear: Hearing normal.  Nose: Nose normal.  Mouth/Throat: Oropharynx is clear and moist and mucous membranes are normal.  Eyes: Conjunctivae and EOM are normal. Pupils are equal, round, and reactive to light.  Neck: Normal range of motion. Neck supple.  Cardiovascular: Regular rhythm, S1 normal and S2 normal.  Exam reveals no gallop and no friction rub.   No murmur heard. Pulmonary/Chest: Effort normal and breath sounds normal. No respiratory distress. He exhibits no tenderness.  Abdominal: Soft. Normal appearance and bowel sounds are normal. There is  no hepatosplenomegaly. There is no tenderness. There is no rebound, no guarding, no tenderness at McBurney's point and negative Murphy's sign. No hernia.  Musculoskeletal: Normal range of motion.  Neurological: He is alert and oriented to person, place, and time. He has normal strength. No cranial nerve deficit or sensory deficit. Coordination normal. GCS eye subscore is 4. GCS verbal subscore is 5. GCS motor subscore is 6.  Skin: Skin is warm, dry and intact. No rash noted. No cyanosis.  Psychiatric: He has a normal mood and affect. His speech is normal and behavior is normal. Thought content normal.    ED Course  Procedures (including critical care time) Labs Reviewed  COMPREHENSIVE METABOLIC PANEL - Abnormal; Notable for the following:    Glucose, Bld 114 (*)    GFR calc non Af Amer 54 (*)    GFR calc Af Amer 62 (*)    All other components within normal limits  GLUCOSE, CAPILLARY - Abnormal; Notable for the following:    Glucose-Capillary 129 (*)    All other components within normal limits  CBC WITH DIFFERENTIAL  URINE RAPID DRUG SCREEN (HOSP PERFORMED)  ETHANOL   No results found.  Diagnosis: Depression    MDM  PAtient presents with complaints of depression. Medical workup negative, patient medically clear for psychiatric evaluation and treatment.  Gilda Crease, MD 04/25/13 [phone]

## 2013-04-22 NOTE — ED Notes (Signed)
78 year old BMM who has been using an antidepressant for the last 5 years. States that he has been becoming increasingly more depressed over the last month, increasing considerably  over the last 2 weeks. States "I don't want to kill myself, I want to live, but this depression is making me feel useless and like I am nothing. I have tried to see my psychiatrist but he is too busy and only sees me once a year. I just knew I needed to do something.

## 2013-04-22 NOTE — ED Notes (Signed)
Patient reports that he is currently taking medication for depression and has been on the same medication for the past 5 years. Patient denies SI/HI, visual and or auditory hallucinations. Patient states he just "feels empty inside." Patient states tht he feels like he needs to go get help for depression.

## 2013-04-22 NOTE — ED Notes (Signed)
Report called to Kris.

## 2013-04-23 LAB — GLUCOSE, CAPILLARY: Glucose-Capillary: 129 mg/dL — ABNORMAL HIGH (ref 70–99)

## 2013-04-23 NOTE — BH Assessment (Signed)
Assessment Note   Austin Sawyer is an 78 y.o. male. Pt presents voluntarily to [facility] endorsing increased depression over past month. Pt reports he is taking care of his wife who had a stroke two years ago and has diabetes. Pt says he is the sole caretaker. He endorses hopelessness, insomnia, loss of interest in usual pleasures. "I'm not feeling like myself. I have no joy anymore." Pt denies SI and HI. He denies [facility] and no delusions noted. Pt says he would never hurt himself or his wife b/c they have been together 53 years. Pt sees Dr. Archer Asa once a year for med management. Pt was at [facility] March 2009 for alcohol abuse. He says he hasn't had a drink since that admission. Pt was in [employer] for 23 years.  He also endorses decreased concentration and memory impairment. Pt will be d/c with info re: outpatient services including Dr. Dub Mikes whom he saw as an inpatient. Pt reports he really like Dr. Dub Mikes. Info provided re: skilled nursing facilities for wife.   Axis I: Major Depressive Disorder, Recurrent, Severe without Psychotic Features            Axis II: Deferred Axis III:  Past Medical History  Diagnosis Date  . Hypertension   . Coronary artery disease   . Diabetes mellitus without complication   . MI, old   . Reflux    Axis IV: other psychosocial or environmental problems, problems related to social environment and problems with primary support group Axis V: 51-60 moderate symptoms  Past Medical History:  Past Medical History  Diagnosis Date  . Hypertension   . Coronary artery disease   . Diabetes mellitus without complication   . MI, old   . Reflux     Past Surgical History  Procedure Laterality Date  . Hernia repair    . Rotator cuff repair      Family History: History reviewed. No pertinent family history.  Social History:  reports that he has quit smoking. He does not have any smokeless tobacco history on file. He reports that he does not drink alcohol or use illicit  drugs.  Additional Social History:  Alcohol / Drug Use Pain Medications: see PTA meds lsit Prescriptions: see PTA meds list Over the Counter: see PTA meds list History of alcohol / drug use?: Yes Longest period of sobriety (when/how long): from 2009 until now Substance #1 Name of Substance 1: alcohol 1 - Last Use / Amount: pt last drank March 2009 prior to inpatient at [facility]  CIWA: CIWA-Ar BP: 147/87 mmHg Pulse Rate: 52 (Nurse Informed) COWS:    Allergies: No Known Allergies  Home Medications:  (Not in a hospital admission)  OB/GYN Status:  No LMP for male patient.  General Assessment Data Location of Assessment: [facility] Living Arrangements: Spouse/significant other Can pt return to current living arrangement?: Yes Admission Status: Voluntary Is patient capable of signing voluntary admission?: Yes Transfer from: Acute Hospital Referral Source: Self/Family/Friend  Education Status Is patient currently in school?: No Highest grade of school patient has completed: 14  Risk to self Suicidal Ideation: No Suicidal Intent: No Is patient at risk for suicide?: No Suicidal Plan?: No Access to Means: No What has been your use of drugs/alcohol within the last 12 months?: none Previous Attempts/Gestures: No How many times?: 0 Other Self Harm Risks: none Triggers for Past Attempts:  (n/a) Intentional Self Injurious Behavior: None Family Suicide History: No Recent stressful life event(s): Other (Comment) (sole caretaker  for wife who can no longer walk) Persecutory voices/beliefs?: No Depression: Yes Depression Symptoms: Despondent;Loss of interest in usual pleasures;Insomnia Substance abuse history and/or treatment for substance abuse?: Yes Suicide prevention information given to non-admitted patients: Not applicable  Risk to Others Homicidal Ideation: No Thoughts of Harm to Others: No Current Homicidal Intent: No Current Homicidal Plan: No Access to Homicidal Means:  No Identified Victim: none History of harm to others?: No Assessment of Violence: None Noted Violent Behavior Description: pt calm and cooperative Does patient have access to weapons?: No Criminal Charges Pending?: No Does patient have a court date: No  Psychosis Hallucinations: None noted Delusions: None noted  Mental Status Report Appear/Hygiene: Other (Comment) (appropriate) Eye Contact: Good Motor Activity: Freedom of movement Speech: Logical/coherent;Loud;Other (Comment) (pt is hard of hearing) Level of Consciousness: Alert Mood: Depressed;Anhedonia;Sad Affect: Appropriate to circumstance;Depressed Anxiety Level: None Thought Processes: Coherent;Relevant Judgement: Unimpaired Orientation: Person;Place;Time;Situation Obsessive Compulsive Thoughts/Behaviors: None  Cognitive Functioning Concentration: Decreased Memory: Remote Intact;Recent Impaired IQ: Average Insight: Good Impulse Control: Good Appetite: Fair Weight Loss: 0 Weight Gain: 0 Sleep: Decreased Total Hours of Sleep: 4 Vegetative Symptoms: None  ADLScreening [facility] Assessment Services) Patient's cognitive ability adequate to safely complete daily activities?: Yes Patient able to express need for assistance with ADLs?: Yes Independently performs ADLs?: Yes (appropriate for developmental age)  Abuse/Neglect [facility]) Physical Abuse: Denies Verbal Abuse: Denies Sexual Abuse: Denies  Prior Inpatient Therapy Prior Inpatient Therapy: Yes Prior Therapy Dates: March 2009 Prior Therapy Facilty/Provider(s): [facility] Reason for Treatment: alcohol abuse  Prior Outpatient Therapy Prior Outpatient Therapy: No Prior Therapy Dates: na Prior Therapy Facilty/Provider(s): na Reason for Treatment: na  ADL Screening (condition at time of admission) Patient's cognitive ability adequate to safely complete daily activities?: Yes Is the patient deaf or have difficulty hearing?: Yes Does the patient have difficulty  seeing, even when wearing glasses/contacts?: No Does the patient have difficulty concentrating, remembering, or making decisions?: Yes Patient able to express need for assistance with ADLs?: Yes Does the patient have difficulty dressing or bathing?: No Independently performs ADLs?: Yes (appropriate for developmental age) Does the patient have difficulty walking or climbing stairs?: No Weakness of Legs: None Weakness of Arms/Hands: None  Home Assistive Devices/Equipment Home Assistive Devices/Equipment:  (hearing aids)    Abuse/Neglect Assessment (Assessment to be complete while patient is alone) Physical Abuse: Denies Verbal Abuse: Denies Sexual Abuse: Denies Exploitation of patient/patient's resources: Denies Self-Neglect: Denies Values / Beliefs Cultural Requests During Hospitalization: No blood transfusion (add FYI) (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 Runell Gess contact info)  On Site Evaluation by:   Reviewed with Physician:     Donnamarie Rossetti P 04/23/2013 12:28 AM

## 2013-04-25 ENCOUNTER — Encounter (HOSPITAL_COMMUNITY): Payer: Self-pay | Admitting: Behavioral Health

## 2013-04-25 ENCOUNTER — Ambulatory Visit (HOSPITAL_COMMUNITY): Admission: RE | Admit: 2013-04-25 | Payer: Medicare Other | Source: Home / Self Care | Admitting: Psychiatry

## 2013-04-25 NOTE — BH Assessment (Signed)
Assessment Note   Austin Sawyer is an 78 y.o. male that presents voluntarily to [facility] for his scheduled assessment to start MH-IOP on 04/27/2013. Pt is accompanied his wife of 53 yrs, he is oriented x's 4, alert, calm and cooperative. Pt was referred to the outpatient clinic at the time of his discharge from [facility].on 04/24/2013. Pt denies SI, HI, AVH, delusions or psychosis. Pt reports that "since my wife has her stroke, I take care of her and she is diabetic."  Pt confirms that he worries about his wife and that he misses how she used to be able to do things and "go". Pt denies any terminal illness, feelings of hopelessness and confirms loss of interest in usual pleasures, insomnia (4-5/24). Pt said "I've noticed it getting worse for about the last 3 weeks."  Pt denies any criminal charges pending or court dates. Pt confirms that his concentration is decreased, remote memory is intact and his recent memory is impaired. Pt reports that his appetite is poor and has lost "5-8 lbs in 3 wks". Pt confirms that he had a past hx of etoh abuse and said "I had 27 yrs clean and I took that drink 5 yrs ago". Pt said that he has not had a drink since "2009". Pt said "I was here [facility]) in 2009 for depression and alcohol and in the 1970"s in [place] for alcohol". Pt denies any outpatient tx. 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 Pt can complete all ADL's w/o assistance. Pt did not require a MSE for his assessment. Denice Bors, AADC 04/25/2013 6:23 PM  Axis I: Major Depression, Recurrent severe Axis II: Deferred Axis III:  Past Medical History  Diagnosis Date  . Hypertension   . Coronary artery disease   . Diabetes mellitus without complication   . MI, old   . Reflux   . Depression    Axis IV: other psychosocial or environmental problems, problems related to legal system/crime,  problems with access to health care services, problems with primary support group and pt is sole caregiver to his wife's medical needs Axis V: 61-70 mild symptoms  Past Medical History:  Past Medical History  Diagnosis Date  . Hypertension   . Coronary artery disease   . Diabetes mellitus without complication   . MI, old   . Reflux   . Depression     Past Surgical History  Procedure Laterality Date  . Hernia repair    . Rotator cuff repair      Family History: History reviewed. No pertinent family history.  Social History:  reports that he has quit smoking. He does not have any smokeless tobacco history on file. He reports that he does not drink alcohol or use illicit drugs.  Additional Social History:  Alcohol / Drug Use Pain Medications: pt denies Prescriptions: pt denies Over the Counter: pt denies History of alcohol / drug use?: Yes (pt relapsed 5 yrs ago after 27 yrs clean) Longest period of sobriety (when/how long): 27 yrs Negative Consequences of Use: Personal relationships Substance #1 Name of Substance 1: alcohol 1 - Last Use / Amount: pt last drank March 2009 prior to inpatient at [facility]  CIWA: CIWA-Ar BP: 162/95 mmHg Pulse Rate: 57 COWS:    Allergies: No Known Allergies  Home Medications:  (Not in a hospital admission)  OB/GYN Status:  No LMP for male patient.  General Assessment Data Location of Assessment: [facility] Assessment Services Living Arrangements: Spouse/significant other Can pt return to current living arrangement?: Yes Admission Status: Voluntary Is patient capable of signing voluntary admission?: Yes Transfer from: Home Referral Source: MD  Education Status Is patient currently in school?: No Highest grade of school patient has completed: 14  Risk to self Suicidal Ideation: No Suicidal Intent: No Is patient at risk for suicide?: No Suicidal Plan?: No Access to Means: No What has been your use of drugs/alcohol within the last 12  months?:  (none noted) Previous Attempts/Gestures: No How many times?: 0 Other Self Harm Risks:  (none noted) Triggers for Past Attempts:  (none noted) Intentional Self Injurious Behavior:  (none noted) Family Suicide History: No Recent stressful life event(s): Loss (Comment);Other (Comment) (Loss of relationship with wife, sole caretaker of wife) Persecutory voices/beliefs?: No Depression: Yes Depression Symptoms: Loss of interest in usual pleasures;Fatigue;Insomnia Substance abuse history and/or treatment for substance abuse?: No Suicide prevention information given to non-admitted patients: Not applicable  Risk to Others Homicidal Ideation: No Thoughts of Harm to Others: No Current Homicidal Intent: No Current Homicidal Plan: No Access to Homicidal Means: No Identified Victim:  (none noted) History of harm to others?: No Assessment of Violence: None Noted Violent Behavior Description:  (none noted) Does patient have access to weapons?: No Criminal Charges Pending?: No Does patient have a court date: No  Psychosis Hallucinations: None noted Delusions: None noted  Mental Status Report Appear/Hygiene: Other (Comment) (dress casual) Eye Contact: Good Motor Activity: Freedom of movement Speech: Logical/coherent Level of Consciousness: Alert Mood: Apathetic;Other (Comment) (appropiate to circumstance) Affect: Appropriate to circumstance Anxiety Level: None Thought Processes: Coherent;Relevant Judgement: Unimpaired Orientation: Person;Place;Time;Situation Obsessive Compulsive Thoughts/Behaviors: None  Cognitive Functioning Concentration: Decreased Memory: Remote Intact;Recent Impaired IQ: Average Insight: Good Impulse Control: Good Appetite: Poor Weight Loss:  (5-8 lbs) Weight Gain:  (0) Sleep: Decreased Total Hours of Sleep:  (4-5) Vegetative Symptoms: None  ADLScreening [facility] Assessment Services) Patient's cognitive ability adequate to safely complete daily  activities?: Yes Patient able to express need for assistance with ADLs?: Yes Independently performs ADLs?: Yes (appropriate for developmental age)  Abuse/Neglect [facility]) Physical Abuse: Denies Verbal Abuse: Denies Sexual Abuse: Denies  Prior Inpatient Therapy Prior Inpatient Therapy: Yes Prior Therapy Dates: March 2009 Prior Therapy Facilty/Provider(s): [facility] Reason for Treatment: alcohol abuse  Prior Outpatient Therapy Prior Outpatient Therapy: No Prior Therapy Dates: na Prior Therapy Facilty/Provider(s): na Reason for Treatment: na  ADL Screening (condition at time of admission) Patient's cognitive ability adequate to safely complete daily activities?: Yes Is the patient deaf or have difficulty hearing?: Yes (pt has hearing aid in r-ear) Does the patient have difficulty seeing, even when wearing glasses/contacts?: No Does the patient have difficulty concentrating, remembering, or making decisions?: Yes Patient able to express need for assistance with ADLs?: Yes Does the patient have difficulty dressing or bathing?: No Independently performs ADLs?: Yes (appropriate for developmental age) Does the patient have difficulty walking or climbing stairs?: No Weakness of Legs: None Weakness of Arms/Hands: None  Home Assistive Devices/Equipment Home Assistive Devices/Equipment: None  Therapy Consults (therapy consults require a physician order) PT Evaluation Needed: No OT Evalulation Needed: No SLP Evaluation Needed: No Abuse/Neglect Assessment (Assessment to be complete while patient is alone) Physical Abuse: Denies Verbal Abuse: Denies Sexual Abuse: Denies Exploitation of patient/patient's resources: Denies Self-Neglect: Denies Values / Beliefs Cultural Requests During Hospitalization: No blood transfusion (add FYI) (pt's religious  beliefs) Spiritual Requests During Hospitalization: None Consults Spiritual Care Consult Needed: No Social Work Consult Needed: No Dispensing optician (For Healthcare) Advance Directive: Patient does not have advance directive Pre-existing out of facility DNR order (yellow form or pink MOST form): No Nutrition Screen- [facility] Adult/[facility] Patient's home diet: Regular  Additional Information 1:1 In Past 12 Months?: No CIRT Risk: No Elopement Risk: No Does patient have medical clearance?: No     Disposition: Pt completed his assessment for [facility]-I[facility] and will start group 04/27/13 at 9 AM. Disposition Initial Assessment Completed for this Encounter: Yes Disposition of Patient: Outpatient treatment Type of outpatient treatment: Psych Intensive Outpatient;Adult Other disposition(s): Other (Comment) ([facility]-IOP )  On Site Evaluation by:   Reviewed with Physician:     Manual Meier 04/25/2013 5:50 PM

## 2013-04-26 ENCOUNTER — Ambulatory Visit: Payer: Medicare Other | Admitting: Psychiatry

## 2013-04-26 DIAGNOSIS — N318 Other neuromuscular dysfunction of bladder: Secondary | ICD-10-CM | POA: Diagnosis not present

## 2013-04-26 DIAGNOSIS — F411 Generalized anxiety disorder: Secondary | ICD-10-CM | POA: Diagnosis not present

## 2013-04-26 DIAGNOSIS — F329 Major depressive disorder, single episode, unspecified: Secondary | ICD-10-CM | POA: Diagnosis not present

## 2013-04-26 DIAGNOSIS — E785 Hyperlipidemia, unspecified: Secondary | ICD-10-CM | POA: Diagnosis not present

## 2013-04-26 DIAGNOSIS — M109 Gout, unspecified: Secondary | ICD-10-CM | POA: Diagnosis not present

## 2013-04-26 DIAGNOSIS — E119 Type 2 diabetes mellitus without complications: Secondary | ICD-10-CM | POA: Diagnosis not present

## 2013-04-26 DIAGNOSIS — T7840XA Allergy, unspecified, initial encounter: Secondary | ICD-10-CM | POA: Diagnosis not present

## 2013-04-26 DIAGNOSIS — M545 Low back pain: Secondary | ICD-10-CM | POA: Diagnosis not present

## 2013-04-27 ENCOUNTER — Inpatient Hospital Stay (HOSPITAL_COMMUNITY)
Admission: AD | Admit: 2013-04-27 | Discharge: 2013-05-01 | DRG: 885 | Disposition: A | Discharge status: Home / Self Care | Payer: Medicare Other | Source: Ambulatory Visit | Attending: Psychiatry | Admitting: Psychiatry

## 2013-04-27 ENCOUNTER — Encounter (HOSPITAL_COMMUNITY): Payer: Self-pay | Admitting: *Deleted

## 2013-04-27 DIAGNOSIS — Z8719 Personal history of other diseases of the digestive system: Secondary | ICD-10-CM

## 2013-04-27 DIAGNOSIS — I509 Heart failure, unspecified: Secondary | ICD-10-CM

## 2013-04-27 DIAGNOSIS — R45851 Suicidal ideations: Secondary | ICD-10-CM

## 2013-04-27 DIAGNOSIS — F1011 Alcohol abuse, in remission: Secondary | ICD-10-CM

## 2013-04-27 DIAGNOSIS — E119 Type 2 diabetes mellitus without complications: Secondary | ICD-10-CM | POA: Diagnosis present

## 2013-04-27 DIAGNOSIS — I6350 Cerebral infarction due to unspecified occlusion or stenosis of unspecified cerebral artery: Secondary | ICD-10-CM

## 2013-04-27 DIAGNOSIS — I2510 Atherosclerotic heart disease of native coronary artery without angina pectoris: Secondary | ICD-10-CM | POA: Diagnosis present

## 2013-04-27 DIAGNOSIS — E291 Testicular hypofunction: Secondary | ICD-10-CM

## 2013-04-27 DIAGNOSIS — M109 Gout, unspecified: Secondary | ICD-10-CM

## 2013-04-27 DIAGNOSIS — Z79899 Other long term (current) drug therapy: Secondary | ICD-10-CM | POA: Diagnosis not present

## 2013-04-27 DIAGNOSIS — I429 Cardiomyopathy, unspecified: Secondary | ICD-10-CM

## 2013-04-27 DIAGNOSIS — I4892 Unspecified atrial flutter: Secondary | ICD-10-CM

## 2013-04-27 DIAGNOSIS — E669 Obesity, unspecified: Secondary | ICD-10-CM

## 2013-04-27 DIAGNOSIS — F411 Generalized anxiety disorder: Secondary | ICD-10-CM | POA: Diagnosis not present

## 2013-04-27 DIAGNOSIS — I209 Angina pectoris, unspecified: Secondary | ICD-10-CM

## 2013-04-27 DIAGNOSIS — F341 Dysthymic disorder: Secondary | ICD-10-CM

## 2013-04-27 DIAGNOSIS — M129 Arthropathy, unspecified: Secondary | ICD-10-CM

## 2013-04-27 DIAGNOSIS — F329 Major depressive disorder, single episode, unspecified: Secondary | ICD-10-CM

## 2013-04-27 DIAGNOSIS — R7309 Other abnormal glucose: Secondary | ICD-10-CM

## 2013-04-27 DIAGNOSIS — F1021 Alcohol dependence, in remission: Secondary | ICD-10-CM

## 2013-04-27 DIAGNOSIS — R634 Abnormal weight loss: Secondary | ICD-10-CM

## 2013-04-27 DIAGNOSIS — I10 Essential (primary) hypertension: Secondary | ICD-10-CM | POA: Diagnosis present

## 2013-04-27 DIAGNOSIS — G4730 Sleep apnea, unspecified: Secondary | ICD-10-CM

## 2013-04-27 DIAGNOSIS — K219 Gastro-esophageal reflux disease without esophagitis: Secondary | ICD-10-CM

## 2013-04-27 DIAGNOSIS — K2270 Barrett's esophagus without dysplasia: Secondary | ICD-10-CM

## 2013-04-27 DIAGNOSIS — F332 Major depressive disorder, recurrent severe without psychotic features: Principal | ICD-10-CM | POA: Diagnosis present

## 2013-04-27 DIAGNOSIS — E785 Hyperlipidemia, unspecified: Secondary | ICD-10-CM

## 2013-04-27 LAB — GLUCOSE, CAPILLARY: Glucose-Capillary: 100 mg/dL — ABNORMAL HIGH (ref 70–99)

## 2013-04-27 MED ORDER — ACETAMINOPHEN 325 MG PO TABS: 650.0000 mg | ORAL_TABLET | Freq: Four times a day (QID) | ORAL | Status: DC | PRN

## 2013-04-27 MED ORDER — MAGNESIUM HYDROXIDE 400 MG/5ML PO SUSP: 30.0000 mL | Freq: Every day | ORAL | Status: DC | PRN

## 2013-04-27 MED ORDER — ENSURE COMPLETE PO LIQD
237.0000 mL | Freq: Two times a day (BID) | ORAL | Status: DC
  Administered 2013-04-28 – 2013-05-01 (×6): 237 mL via ORAL

## 2013-04-27 MED ORDER — ALUM & MAG HYDROXIDE-SIMETH 200-200-20 MG/5ML PO SUSP: 30.0000 mL | ORAL | Status: DC | PRN

## 2013-04-27 MED ORDER — MIRTAZAPINE 30 MG PO TABS
30.0000 mg | ORAL_TABLET | Freq: Every day | ORAL | Status: DC
  Administered 2013-04-27 – 2013-04-28 (×2): 30 mg via ORAL
  Filled 2013-04-27 (×6): qty 1

## 2013-04-27 NOTE — Progress Notes (Addendum)
Nutrition Brief Note  Patient identified on the Malnutrition Screening Tool (MST) Report  Pt meets criteria for severe MALNUTRITION in the context of social/environmental circumstances as evidenced by pt with <50% estimated energy intake in the past month with 11.2% weight loss in the past 8-9 months.  Body mass index is 31.91 kg/(m^2). Patient meets criteria for class I obesity based on current BMI.   Wt Readings from Last 5 Encounters:  04/27/13 213 lb (96.616 kg)  04/22/13 220 lb (99.791 kg)  10/04/12 220 lb (99.791 kg)  06/23/12 219 lb (99.338 kg)  06/19/10 221 lb (100.245 kg)    Met with pt who reports poor appetite for the past 3 weeks due to stress of taking care of wife and having depression. States he was been eating only 1 meal/day and drinking 1 Ensure and 1 Carnation Instant Breakfast per day. Pt's weight down 7 pounds in the past month and 27 pounds down in the past 8-9 months per pt report. Pt was working on meal during RD visit, was eating well. Agreeable to getting Ensure Complete BID during admission. Discussed ways to improve appetite. Encouraged pt to consume at least 50% of meals during admission. No further nutrition intervention indicated at this time.   Levon Hedger MS, RD, LDN [phone] Pager [phone] After Hours Pager

## 2013-04-27 NOTE — Progress Notes (Signed)
Adult Psychoeducational Group Note  Date:  04/27/2013 Time:    Group Topic/Focus:  Wrap-Up Group:   The focus of this group is to help patients review their daily goal of treatment and discuss progress on daily workbooks.  Participation Level:  Active  Participation Quality:  Appropriate  Affect:  Appropriate  Cognitive:  Appropriate  Insight: Appropriate  Engagement in Group:  Engaged  Modes of Intervention:  Discussion  Additional Comments:    Daneille Desilva A 04/27/2013, 9:50 PM

## 2013-04-27 NOTE — Progress Notes (Signed)
Nrsg Admit This is the 2nd admission for this very pleasant, cooperative and depressed gentleman. Austin Sawyer comes to  [facility] after being seen downstairs in outpatient earlier today, and admitting to them that he was feeling suicidal ( he denies this to this Clinical research associate). He is flat, avoids eye contact, denies known drug allergies and states his PMH is sign for alcoholism ( here in 2009 for brief rehab visit, but sober for 30 yrs otherwise), gout, BPH, CAD, DM, med-controlled, HOH  Wears bilateral hearing aides  And eyeglasses, and  has hyperchoesteremia, . He is vague about what meds he has been taking, he is vague and avoids answering questions about his diabetes care. HE reports his biggest stressor is the care of his elderly, disabled wife who is suffering from a long battle with diabetes (he says she is very brittle) and says she is totally disabled due to a stoke. HE says he can no longer " carry on" with the running of their household, as well as her care and his care and that it is  " just too much". 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 He contracts with this nurse for safety, verbal report is given to his nurse for his continuing care and admission is complete.

## 2013-04-27 NOTE — [facility] Group Notes (Signed)
[facility] LCSW Group Therapy  04/27/2013 5:07 PM  Type of Therapy:  Group Therapy  Participation Level:  Active  Participation Quality:  Attentive  Affect:  Appropriate  Cognitive:  Alert  Insight:  Engaged  Engagement in Therapy:  Engaged  Modes of Intervention:  Confrontation, Discussion, Education, Exploration, Socialization and Support  Summary of Progress/Problems: Feelings around Relapse. Group members discussed the meaning of relapse and shared personal stories of relapse, how it affected them and others, and how they perceived themselves during this time. Group members were encouraged to identify triggers, warning signs and coping skills used when facing the possibility of relapse. Social supports were discussed and explored in detail. Post Acute Withdrawal Syndrome (handout provided) was introduced and examined. Pt's were encouraged to ask questions, talk about key points associated with PAWS, and process this information in terms of relapse prevention. Austin Sawyer stated that he has had seven years of sobriety but everyday problems "got the best of me." Austin Sawyer asserted that he has a positive attitude about recovery and enjoys going to AA and being around people that he shares a common struggle with. Austin Sawyer appears to be making progress in the group setting and regrading insight AEB his ability to talk about PAWS-the importance of taking care of oneself during these episodes and how this can be a time for potential relapse if caught off guard.    Smart, [name] 04/27/2013, 5:07 PM

## 2013-04-27 NOTE — [facility] Counselor (Signed)
Adult Comprehensive Assessment  Patient ID: Austin Sawyer, male   DOB: 1935-07-31, 78 y.o.   MRN: [id number]  Information Source: Information source: Patient  Current Stressors:  Educational / Learning stressors: None Employment / Job issues: None - patient is returied Family Relationships: Wife has had a stroke and has diabetes.   Patient is her caretaker Surveyor, quantity / Lack of resources (include bankruptcy): None Housing / Lack of housing: None Physical health (include injuries & life threatening diseases): None Social relationships: None  Living/Environment/Situation:  Living Arrangements: Spouse/significant other Living conditions (as described by patient or guardian): Good How long has patient lived in current situation?: Many years What is atmosphere in current home: Comfortable;Supportive  Family History:  Marital status: Married Number of Years Married: 2 What types of issues is patient dealing with in the relationship?: Wife's illness Additional relationship information: No support system Does patient have children?: Yes How many children?: 4 How is patient's relationship with their children?: Children have distanced themselves from patient and wife  Childhood History:  By whom was/is the patient raised?: Grandparents Additional childhood history information: Good childhood Description of patient's relationship with caregiver when they were a child: Good relationship with Grandmother Patient's description of current relationship with people who raised him/her: Deceased Does patient have siblings?: Yes Number of Siblings: 1 Description of patient's current relationship with siblings: Sister lives in [place]  - They have limited contact Did patient suffer any verbal/emotional/physical/sexual abuse as a child?: No Did patient suffer from severe childhood neglect?: No Has patient ever been sexually abused/assaulted/raped as an adolescent or adult?: No Was the patient ever a  victim of a crime or a disaster?: No Witnessed domestic violence?: No Has patient been effected by domestic violence as an adult?: No  Education:  Currently a Consulting civil engineer?: No Learning disability?: No  Employment/Work Situation:   Employment situation:  (Patient is retired from the Patent examiner) Patient's job has been impacted by current illness: No What is the longest time patient has a held a job?: 23 Years  Where was the patient employed at that time?: [employer] Has patient ever been in the [employer]?: Yes (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 in Buyer, retail?: Yes ([place])  Surveyor, quantity Resources:   Surveyor, quantity resources: Writer (Retirement and Tree surgeon) Does patient have a Lawyer or guardian?: No  Alcohol/Substance Abuse:   What has been your use of drugs/alcohol within the last 12 months?: Hx of Alcohol abuse; sober x 5 years If attempted suicide, did drugs/alcohol play a role in this?: No Alcohol/Substance Abuse Treatment Hx: Denies past history Has alcohol/substance abuse ever caused legal problems?: No  Social Support System:   Conservation officer, nature Support System: Fair Development worker, community Support System: Patient is active with Jehovah's Witness Type of faith/religion: Ephriam Knuckles How does patient's faith help to cope with current illness?: Prayer  Leisure/Recreation:   Leisure and Hobbies: Running  Strengths/Needs:   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 Donell Beers) If no, would patient like referral for services when discharged?: No Does patient have financial barriers related to discharge medications?:  No  Summary/Recommendations:  Austin Sawyer is a 78 years old African American male admitted with Major Depression  Disorder.  He will benefit from crisis stabilization, evaluation for medication, psycho-education groups for coping skills development, group therapy and case management for discharge planning.     Austin Sawyer, Austin July. 04/27/2013

## 2013-04-27 NOTE — Progress Notes (Signed)
Patient ID: Austin Sawyer, male   DOB: January 25, 1935, 78 y.o.   MRN: [id number] D: Patient denies SI/HI/AVH and pain. Pt mood/afect seem depressed.  Pt rates depression as 2 and anxiety as 0 on a 0-10 scale.  Pt dicussed with Clinical research associate about been depressed and overwhelmed with the care of wife. Pt attended evening wrap up group and was engaged in discussion. Pt denies any needs or concerns.  Cooperative with assessment. No acute distressed noted at this time.   A: Met with pt 1:1. Medications administered as prescribed. Writer encouraged pt to discuss feelings. Pt encouraged to come to staff with any question or concerns. 15 minutes checks for safety.  R: Patient remains safe. He is complaint with medications and denies any adverse reaction. Continue current POC.

## 2013-04-27 NOTE — BH Assessment (Addendum)
Assessment Note   Austin Sawyer is a 78 y.o. married black male.  He is referred to Assessment Department by Austin Sawyer in the [facility], where he presents today to start MH-IOP.  Crooks Sink reports that pt was seen by his psychiatrist, Austin Asa, MD, who is concerned about pt's worsening depression, and believes that pt would benefit from hospitalization at this time.  Pt reports that he has suffered from depression intermittently throughout his life, but it has worsened over the past 3 weeks.  Pt and his spouse have been married for 53 years.  The spouse has significant health problems, including residual impairment from a stoke in 2009, as well as worsening diabetes.  The pt is now so involved with taking care of the spouse and the household that he neglects his own wellbeing.  He reports that his spouse goes into a "coma" with increasing frequency due to low blood sugar, requiring his immediate intervention.  Pt has been trying to obtain home health services, but is running into obstacles securing this.  All of these factors have contributed to his worsening depression.  Pt endorses depressed mood with symptoms noted in the "risk to self" assessment below.  He also reports poor appetite with 8 lb. weight loss over the past 3 weeks.  Pt reports SI as well, and while he denies having a plan to this Clinical research associate, he fears that he could end up taking his life if not for the professional supports on which he depends.  Moreover, pt reported to Austin Sawyer that he has had thoughts of overdosing on his medications, or poisoning himself with alcohol.  He also reports that at the time of his admission to [facility] in 12/2007 he had attempted to kill himself by alcohol poisoning.  He denies HI or physical aggression.  He denies hallucinations, he exhibits no signs of internal stimuli or delusional thought, and his reality testing appears to be intact.  Pt reportedly has a history of abusing alcohol for more that 30  years.  This was followed by a 27 year period of sobriety, before relapsing at the time of the suicide attempt in 2009.  He has not used alcohol since that time, and denies using any other substances.  In addition to the aforesaid admission to [facility] in 2009, pt reports that he was admitted to a facility in [place] in 1979 for a 30 day rehab program.  He has been seeing Dr Austin Sawyer for outpatient psychiatry for the past four years.  Today pt is willing to comply with Dr Austin Sawyer intentions, and volunteer for admission to [facility]  Axis I: Major Depressive Disorder, recurrent, severe, without psychotic features 296.33 Axis II: Deferred 799.9 Axis III:  Past Medical History  Diagnosis Date  . Hypertension   . Coronary artery disease   . Diabetes mellitus without complication   . MI, old   . Reflux   . Depression    Axis IV: problems with access to health care services and problems with primary support group Axis V: GAF = 35  Past Medical History:  Past Medical History  Diagnosis Date  . Hypertension   . Coronary artery disease   . Diabetes mellitus without complication   . MI, old   . Reflux   . Depression     Past Surgical History  Procedure Laterality Date  . Hernia repair    . Rotator cuff repair      Family History: No family history on file.  Social  History:  reports that he has quit smoking. He does not have any smokeless tobacco history on file. He reports that he does not drink alcohol or use illicit drugs.  Additional Social History:  Alcohol / Drug Use Longest period of sobriety (when/how long): Pt has maintained sobriety since 2009  CIWA:   COWS:    Allergies: No Known Allergies  Home Medications:  Medications Prior to Admission  Medication Sig Dispense Refill  . B Complex-C (B-COMPLEX WITH VITAMIN C) tablet Take 1 tablet by mouth daily.      . carvedilol (COREG) 12.5 MG tablet Take 12.5 mg by mouth 2 (two) times daily with a meal.      . colchicine 0.6 MG tablet  Take 0.6 mg by mouth daily.      Marland Kitchen darifenacin (ENABLEX) 7.5 MG 24 hr tablet Take 7.5 mg by mouth daily.      . DULoxetine (CYMBALTA) 30 MG capsule Take 30 mg by mouth daily.      Marland Kitchen ezetimibe-simvastatin (VYTORIN) 10-80 MG per tablet Take 1 tablet by mouth at bedtime.      . hydrOXYzine (VISTARIL) 50 MG capsule Take 50 mg by mouth at bedtime.      . metFORMIN (GLUCOPHAGE-XR) 500 MG 24 hr tablet Take 500 mg by mouth daily with breakfast.      . mirtazapine (REMERON) 30 MG tablet Take 30 mg by mouth at bedtime.      . solifenacin (VESICARE) 5 MG tablet Take 10 mg by mouth daily.      . Tamsulosin HCl (FLOMAX) 0.4 MG CAPS Take 0.4 mg by mouth.      . telmisartan-hydrochlorothiazide (MICARDIS HCT) 80-25 MG per tablet Take 1 tablet by mouth daily.  30 tablet  0  . nitroGLYCERIN (NITROSTAT) 0.4 MG SL tablet Place 0.4 mg under the tongue every 5 (five) minutes as needed. Chest pain        OB/GYN Status:  No LMP for male patient.  General Assessment Data Location of Assessment: [facility] Living Arrangements: Spouse/significant other Can pt return to current living arrangement?: Yes Admission Status: Voluntary Is patient capable of signing voluntary admission?: Yes Transfer from: Other (Comment) [facility])) Referral Source: Other Austin Asa, MD & Austin Sawyer)  Education Status Is patient currently in school?: No  Risk to self Suicidal Ideation: Yes-Currently Present Suicidal Intent: No Is patient at risk for suicide?: Yes Suicidal Plan?: No Access to Means: Yes Specify Access to Suicidal Means: Pt has access to alcohol in the community What has been your use of drugs/alcohol within the last 12 months?: Hx of Alcohol abuse; sober x 5 years Previous Attempts/Gestures: Yes How many times?: 1 (Pt attempted to poison himself with alcohol in the past.) Other Self Harm Risks: Pt fears that without professional support he could take his life, and  therefore cannot contract for safety. Triggers for Past Attempts: Other (Comment) (Pt turned 78 y/o & felt he had lived long enough.) Intentional Self Injurious Behavior: None Family Suicide History: No (Mother and sister have both suffered from depression.) Recent stressful life event(s): Other (Comment) (Wife's physical decline; difficulty securing home health aid) Persecutory voices/beliefs?: No Depression: Yes Depression Symptoms: Insomnia;Loss of interest in usual pleasures;Feeling worthless/self pity;Feeling angry/irritable (Hopelessness) Substance abuse history and/or treatment for substance abuse?: Yes (Hx of alcohol abuse) Suicide prevention information given to non-admitted patients: Yes  Risk to Others Homicidal Ideation: No Thoughts of Harm to Others: No Current Homicidal Intent: No Current Homicidal Plan:  No Access to Homicidal Means: No Identified Victim: None History of harm to others?: No Assessment of Violence: None Noted Violent Behavior Description: Calm/cooperative Does patient have access to weapons?: No (Denies having firearms) Criminal Charges Pending?: No Does patient have a court date: No  Psychosis Hallucinations: None noted Delusions: None noted  Mental Status Report Appear/Hygiene: Other (Comment) (Neat, well groomed) Eye Contact: Poor Motor Activity: Unremarkable Speech: Other (Comment) (Unremarkable) Level of Consciousness: Alert Mood: Depressed Affect: Other (Comment) (Constricted) Anxiety Level: None Thought Processes: Circumstantial Judgement: Unimpaired Orientation: Person;Place;Time;Situation Obsessive Compulsive Thoughts/Behaviors: None  Cognitive Functioning Concentration: Decreased Memory: Recent Intact;Remote Intact IQ: Average Insight: Good Impulse Control: Good Appetite: Fair Weight Loss: 8 (over past 3 weeks) Weight Gain: 0 Sleep: Decreased Total Hours of Sleep: 7 (Mid-insomnia) Vegetative Symptoms: None  ADLScreening  [facility]) Patient's cognitive ability adequate to safely complete daily activities?: Yes     Prior Inpatient Therapy Prior Inpatient Therapy: Yes Prior Therapy Dates: March 2009: [facility] for alcohol detox Prior Therapy Facilty/Provider(s): 1979: Facility in [place] - 30 day alcohol rehab program Reason for Treatment: Possibly other rehab programs in the distant past.  Prior Outpatient Therapy Prior Outpatient Therapy: Yes Prior Therapy Dates: Past 4 years: Austin Asa, MD for depression  ADL Screening (condition at time of admission) Patient's cognitive ability adequate to safely complete daily activities?: Yes Is the patient deaf or have difficulty hearing?: Yes Does the patient have difficulty seeing, even when wearing glasses/contacts?: No Does the patient have difficulty concentrating, remembering, or making decisions?: Yes  Home Assistive Devices/Equipment Home Assistive Devices/Equipment: Eyeglasses;Other (Comment) (hearing aid (not in pt's possession today))      Values / Beliefs Cultural Requests During Hospitalization: Other (comment) (Pt identifies as Jehovah's Witness)   Advance Directives (For Healthcare) Advance Directive: Patient does not have advance directive;Patient would like information Patient requests advance directive information: Advance directive packet given Pre-existing out of facility DNR order (yellow form or pink MOST form): No Nutrition Screen- [facility] Adult/[facility]/AP Patient's home diet: Carb modified  Additional Information 1:1 In Past 12 Months?: No CIRT Risk: No Elopement Risk: No Does patient have medical clearance?: No     Disposition:  Disposition Initial Assessment Completed for this Encounter: Yes Disposition of Patient: Inpatient treatment program Type of inpatient treatment program: Adult Per Austin Sawyer, MEd, pt was seen in the Outpatient Clinic by Austin Asa, MD, who believes that pt needs to be hospitalized.  At 12:00  I spoke to Dahlia Byes, NP who agrees to enter orders into EPIC.  Spoke to Berneice Heinrich, RN, Neurosurgeon, who assigned pt to Rm [phone].  Pt signed Voluntary Admission and Consent for Treatment.  On Site Evaluation by:   Reviewed with Physician:  Dahlia Byes, NP @ 12:00  Doylene Canning, MA Assessment Counselor Raphael Gibney 04/27/2013 12:55 PM

## 2013-04-28 LAB — GLUCOSE, CAPILLARY
Glucose-Capillary: 105 mg/dL — ABNORMAL HIGH (ref 70–99)
Glucose-Capillary: 100 mg/dL — ABNORMAL HIGH (ref 70–99)
Glucose-Capillary: 129 mg/dL — ABNORMAL HIGH (ref 70–99)

## 2013-04-28 LAB — TSH: TSH: 1.027 u[IU]/mL (ref 0.350–4.500)

## 2013-04-28 MED ORDER — DARIFENACIN HYDROBROMIDE ER 7.5 MG PO TB24
7.5000 mg | ORAL_TABLET | Freq: Every day | ORAL | Status: DC
  Administered 2013-04-29 – 2013-05-01 (×3): 7.5 mg via ORAL
  Filled 2013-04-28 (×5): qty 1

## 2013-04-28 MED ORDER — COLCHICINE 0.6 MG PO TABS
0.6000 mg | ORAL_TABLET | Freq: Every day | ORAL | Status: DC
  Administered 2013-04-28 – 2013-04-30 (×3): 0.6 mg via ORAL
  Filled 2013-04-28 (×6): qty 1

## 2013-04-28 MED ORDER — EZETIMIBE-SIMVASTATIN 10-80 MG PO TABS
1.0000 | ORAL_TABLET | Freq: Every day | ORAL | Status: DC
  Administered 2013-04-29: 1 via ORAL
  Filled 2013-04-28 (×2): qty 1

## 2013-04-28 MED ORDER — IRBESARTAN 300 MG PO TABS
300.0000 mg | ORAL_TABLET | Freq: Every day | ORAL | Status: DC
  Administered 2013-04-29 – 2013-05-01 (×3): 300 mg via ORAL
  Filled 2013-04-28 (×5): qty 1

## 2013-04-28 MED ORDER — HYDROCHLOROTHIAZIDE 25 MG PO TABS
25.0000 mg | ORAL_TABLET | Freq: Every day | ORAL | Status: DC
  Administered 2013-04-29 – 2013-05-01 (×3): 25 mg via ORAL
  Filled 2013-04-28 (×5): qty 1

## 2013-04-28 MED ORDER — NITROGLYCERIN 0.4 MG SL SUBL: 0.4000 mg | SUBLINGUAL_TABLET | SUBLINGUAL | Status: DC | PRN

## 2013-04-28 MED ORDER — CARVEDILOL 12.5 MG PO TABS
12.5000 mg | ORAL_TABLET | Freq: Two times a day (BID) | ORAL | Status: DC
  Administered 2013-04-29 – 2013-05-01 (×5): 12.5 mg via ORAL
  Filled 2013-04-28 (×9): qty 1

## 2013-04-28 MED ORDER — DULOXETINE HCL 30 MG PO CPEP
30.0000 mg | ORAL_CAPSULE | Freq: Every day | ORAL | Status: DC
  Administered 2013-04-28 – 2013-04-29 (×2): 30 mg via ORAL
  Filled 2013-04-28 (×5): qty 1

## 2013-04-28 MED ORDER — TAMSULOSIN HCL 0.4 MG PO CAPS
0.4000 mg | ORAL_CAPSULE | Freq: Every day | ORAL | Status: DC
  Administered 2013-04-29 – 2013-05-01 (×3): 0.4 mg via ORAL
  Filled 2013-04-28 (×5): qty 1

## 2013-04-28 MED ORDER — HYDROXYZINE HCL 50 MG PO TABS
50.0000 mg | ORAL_TABLET | Freq: Every day | ORAL | Status: DC
  Administered 2013-04-28 – 2013-04-30 (×3): 50 mg via ORAL
  Filled 2013-04-28 (×6): qty 1

## 2013-04-28 MED ORDER — METFORMIN HCL ER 500 MG PO TB24
500.0000 mg | ORAL_TABLET | Freq: Every day | ORAL | Status: DC
  Administered 2013-04-29 – 2013-05-01 (×3): 500 mg via ORAL
  Filled 2013-04-28 (×5): qty 1

## 2013-04-28 NOTE — H&P (Signed)
Psychiatric Admission Assessment Adult  Patient Identification:  Austin Sawyer Date of Evaluation:  04/28/2013 Chief Complaint:  Major Depressive Disorder, recurrent, severe, without psychotic features 296.33 History of Present Illness:  Austin Sawyer is a 78 y.o. married black male. He is referred by his primary Psychiatrist, Archer Asa, MD, for worsening symptoms of depression. Pt reports that his depression has worsened over the past 3 weeks. The spouse has significant health problems, including residual impairment from a stoke in 2009, as well as worsening diabetes. He is now so involved with taking care of the spouse and the household that he neglects his own wellbeing. He reports that his spouse goes into a "coma" with increasing frequency due to low blood sugar, requiring his immediate intervention. He has been trying to obtain home health services, but is running into obstacles securing this - All of these factors have contributed to his worsening depression. He endorses depressed mood with symptoms noted in the "risk to self" . He also reports poor appetite with 8 lb. weight loss over the past 3 weeks. Pt reports SI as well, and while he denies having a plan, he fears that he could end up taking his life if not for the professional supports on which he depends. He has had thoughts of overdosing on his medications, or poisoning himself with alcohol. He has history of previous admission for alcohol dependence. He has a history of abusing alcohol for more that 30 years. He has been seeing Dr Donell Beers for outpatient psychiatry for the past four years.  Elements:  Location:  [facility] adult. Quality:  depression. Severity:  suicidal . Timing:  worsening wifes health and relationship. Duration:  few months. Context:  thoughts about taking overdose. Associated Signs/Synptoms: Depression Symptoms:  depressed mood, anhedonia, insomnia, psychomotor retardation, fatigue, feelings of  worthlessness/guilt, difficulty concentrating, hopelessness, impaired memory, recurrent thoughts of death, anxiety, decreased appetite, (Hypo) Manic Symptoms:  Distractibility, Irritable Mood, Anxiety Symptoms:  Excessive Worry, Psychotic Symptoms:  Not applicable PTSD Symptoms: NA  Psychiatric Specialty Exam: Physical Exam  ROS  Blood pressure 131/89, pulse 72, temperature 99.3 F (37.4 C), temperature source Oral, resp. rate 18, height 5' 8.5" (1.74 m), weight 96.616 kg (213 lb), SpO2 97.00%.Body mass index is 31.91 kg/(m^2).  General Appearance: Disheveled and Guarded  Eye Contact::  Minimal  Speech:  Clear and Coherent and Normal Rate  Volume:  Normal  Mood:  Anxious and Depressed  Affect:  Congruent and Depressed  Thought Process:  Coherent and Goal Directed  Orientation:  Full (Time, Place, and Person)  Thought Content:  NA  Suicidal Thoughts:  Yes.  with intent/plan  Homicidal Thoughts:  No  Memory:  Immediate;   Fair  Judgement:  Intact  Insight:  Lacking  Psychomotor Activity:  Psychomotor Retardation and Restlessness  Concentration:  Fair  Recall:  Good  Akathisia:  NA  Handed:  Right  AIMS (if indicated):     Assets:  Communication Skills Desire for Improvement Housing Leisure Time Physical Health Resilience Social Support Transportation  Sleep:  Number of Hours: 6.5    Past Psychiatric History: Diagnosis:  Hospitalizations:  Outpatient Care:  Substance Abuse Care:  Self-Mutilation:  Suicidal Attempts:  Violent Behaviors:   Past Medical History:   Past Medical History  Diagnosis Date  . Hypertension   . Coronary artery disease   . Diabetes mellitus without complication   . MI, old   . Reflux   . Depression    None. Allergies:  No Known Allergies  PTA Medications: Prescriptions prior to admission  Medication Sig Dispense Refill  . B Complex-C (B-COMPLEX WITH VITAMIN C) tablet Take 1 tablet by mouth daily.      . carvedilol (COREG)  12.5 MG tablet Take 12.5 mg by mouth 2 (two) times daily with a meal.      . colchicine 0.6 MG tablet Take 0.6 mg by mouth daily.      . DULoxetine (CYMBALTA) 30 MG capsule Take 30 mg by mouth daily.      Marland Kitchen ezetimibe-simvastatin (VYTORIN) 10-80 MG per tablet Take 1 tablet by mouth at bedtime.      . hydrOXYzine (VISTARIL) 50 MG capsule Take 50 mg by mouth at bedtime.      . metFORMIN (GLUCOPHAGE-XR) 500 MG 24 hr tablet Take 500 mg by mouth daily with breakfast.      . mirtazapine (REMERON) 30 MG tablet Take 30 mg by mouth at bedtime.      . solifenacin (VESICARE) 5 MG tablet Take 10 mg by mouth daily.      . Tamsulosin HCl (FLOMAX) 0.4 MG CAPS Take 0.4 mg by mouth.      . telmisartan-hydrochlorothiazide (MICARDIS HCT) 80-25 MG per tablet Take 1 tablet by mouth daily.  30 tablet  0  . darifenacin (ENABLEX) 7.5 MG 24 hr tablet Take 7.5 mg by mouth daily.      . nitroGLYCERIN (NITROSTAT) 0.4 MG SL tablet Place 0.4 mg under the tongue every 5 (five) minutes as needed. Chest pain        Previous Psychotropic Medications:  Medication/Dose                 Substance Abuse History in the last 12 months:  yes  Consequences of Substance Abuse: NA  Social History:  reports that he has quit smoking. He does not have any smokeless tobacco history on file. He reports that he does not drink alcohol or use illicit drugs. Additional Social History: Pain Medications: none Prescriptions: none Over the Counter: N/A History of alcohol / drug use?: Yes Longest period of sobriety (when/how long): 35 yrs Negative Consequences of Use: Personal relationships Withdrawal Symptoms: Other (Comment) (none) Name of Substance 1: alcohol 1 - Age of First Use: 78yo 1 - Frequency: whenever I could 1 - Last Use / Amount: 2009                  Current Place of Residence:   Place of Birth:   Family Members: Marital Status:  Married Children:  Sons:  Daughters: Relationships: Education:   Print production planner Problems/Performance: Religious Beliefs/Practices: History of Abuse (Emotional/Phsycial/Sexual) Occupational Experiences; Hotel manager History:  Data processing manager History: Hobbies/Interests:  Family History:  History reviewed. No pertinent family history.  Results for orders placed during the hospital encounter of 04/27/13 (from the past 72 hour(s))  GLUCOSE, CAPILLARY     Status: Abnormal   Collection Time    04/27/13  5:14 PM      Result Value Range   Glucose-Capillary 100 (*) 70 - 99 mg/dL  TSH     Status: None   Collection Time    04/27/13  7:30 PM      Result Value Range   TSH 1.027  0.350 - 4.500 uIU/mL  GLUCOSE, CAPILLARY     Status: Abnormal   Collection Time    04/28/13  6:17 AM      Result Value Range   Glucose-Capillary 105 (*) 70 - 99 mg/dL  GLUCOSE, CAPILLARY  Status: Abnormal   Collection Time    04/28/13 12:04 PM      Result Value Range   Glucose-Capillary 100 (*) 70 - 99 mg/dL   Comment 1 Notify RN     Psychological Evaluations:  Assessment:   AXIS I:  Major Depression, Recurrent severe AXIS II:  Deferred AXIS III:   Past Medical History  Diagnosis Date  . Hypertension   . Coronary artery disease   . Diabetes mellitus without complication   . MI, old   . Reflux   . Depression    AXIS IV:  other psychosocial or environmental problems and problems related to social environment AXIS V:  41-50 serious symptoms  Treatment Plan/Recommendations:  Admit for crisis stabilization and medication management.  Treatment Plan Summary: Daily contact with patient to assess and evaluate symptoms and progress in treatment Medication management Current Medications:  Current Facility-Administered Medications  Medication Dose Route Frequency Provider Last Rate Last Dose  . acetaminophen (TYLENOL) tablet 650 mg  650 mg Oral Q6H PRN Earney Navy, NP      . alum & mag hydroxide-simeth (MAALOX/MYLANTA) 200-200-20 MG/5ML suspension 30 mL  30 mL  Oral Q4H PRN Earney Navy, NP      . DULoxetine (CYMBALTA) DR capsule 30 mg  30 mg Oral Daily Nehemiah Settle, MD      . feeding supplement (ENSURE COMPLETE) liquid 237 mL  237 mL Oral BID BM Lavena Bullion, RD   237 mL at 04/28/13 1340  . magnesium hydroxide (MILK OF MAGNESIA) suspension 30 mL  30 mL Oral Daily PRN Earney Navy, NP      . mirtazapine (REMERON) tablet 30 mg  30 mg Oral QHS Court Joy, PA-C   30 mg at 04/27/13 2216    Observation Level/Precautions:  15 minute checks  Laboratory:  CBC Chemistry Profile UDS UA  Psychotherapy:  Group and milieu therapy  Medications:  cymbalta and remeron  Consultations:  none  Discharge Concerns:  Depression and safety  Estimated LOS: 3-5 days  Other:     I certify that inpatient services furnished can reasonably be expected to improve the patient's condition.   Jhony Antrim,JANARDHAHA R. 7/26/20143:31 PM

## 2013-04-28 NOTE — Progress Notes (Signed)
Patient ID: Austin Sawyer, male   DOB: [birth year], 78 y.o.   MRN: [id number]  D: Patient has been very flat and depressed on the unit. Pt does attend group, but at times have problems hearing. Pt reported that he was feeling better, just has trouble hearing at times. Pt reported being negative SI/HI, and negative AH/VH. A: 15 min checks continued for patient safety. R: pts safety maintained.

## 2013-04-28 NOTE — [facility] Group Notes (Signed)
[facility] LCSW Group Therapy  04/28/2013   3:00 PM   Type of Therapy:  Group Therapy  Participation Level:  Active  Participation Quality:  Appropriate and Attentive  Affect:  Appropriate, Flat and Depressed  Cognitive:  Alert and Appropriate  Insight:  Developing/Improving and Engaged  Engagement in Therapy:  Developing/Improving and Engaged  Modes of Intervention:  Clarification, Confrontation, Discussion, Education, Exploration, Limit-setting, Orientation, Problem-solving, Rapport Building, Dance movement psychotherapist, Socialization and Support  Summary of Progress/Problems: The main focus of today's process group was for the patient to identify ways in which they have in the past sabotaged their own recovery and reasons they may have done this/what they received from doing it. We then worked to identify a specific plan to avoid doing this when discharged from the hospital for this admission. Pt was with the doctor for a majority of group but was attentive to group discussion when present.   Reyes Ivan, LCSWA 04/28/2013 2:27 PM

## 2013-04-28 NOTE — Progress Notes (Signed)
Psychoeducational Group Note  Date:  04/28/2013 Time: 1015  Group Topic/Focus:  Identifying Needs:   The focus of this group is to help patients identify their personal needs that have been historically problematic and identify healthy behaviors to address their needs.  Participation Level:  Active  Participation Quality: good  Affect: flat  Cognitive: intact  Insight:  good  Engagement in Group: engaged  Additional Comments:PDuke RN [facility]

## 2013-04-28 NOTE — Progress Notes (Signed)
Adult Psychoeducational Group Note  Date:  04/28/2013 Time:  2:41 PM  Group Topic/Focus:  Healthy Communication:   The focus of this group is to discuss communication, barriers to communication, as well as healthy ways to communicate with others.  Participation Level:  Active  Participation Quality:  Appropriate, Sharing and Supportive  Affect:  Flat  Cognitive:  Appropriate  Insight: Appropriate and Good  Engagement in Group:  Engaged and Supportive  Modes of Intervention:  Discussion, Education, Socialization and Support  Additional Comments:  Pt attended group and gave good insight and support to others.  Caswell Corwin 04/28/2013, 2:41 PM

## 2013-04-28 NOTE — Progress Notes (Signed)
Psychoeducational Group Note  Date:   Time:  Group Topic/Focus:  Identifying Needs:   The focus of this group is to help patients identify their personal needs   Participation Level:  Active  Participation Quality: good  Affect: flat  Cognitive: Intact   Insight:  good  Engagement in Group: engaged  Additional Comments:   PDuke RN [employer]

## 2013-04-28 NOTE — Progress Notes (Signed)
The focus of this group is to help patients review their daily goal of treatment and discuss progress on daily workbooks. Pt attended the evening group session and responded to all discussion prompts from the Writer. Pt reported having a good day, the highlight of which were the groups he attended by the daytime Nurse, which Pt reported finding interesting. Pt reported his only needs for the evening from Nursing Staff as being his medication, which he would speak to his Nurse about following groups. Pt's affect was appropriate and offered positive, respectful comments to his peers.

## 2013-04-28 NOTE — [facility] Suicide Risk Assessment (Signed)
Suicide Risk Assessment  Admission Assessment     Nursing information obtained from:  Patient Demographic factors:  Male;Age 76 or older;Low socioeconomic status;Unemployed Current Mental Status:  NA Loss Factors:  Decrease in vocational status;Loss of significant relationship;Decline in physical health;Financial problems / change in socioeconomic status Historical Factors:  Family history of mental illness or substance abuse Risk Reduction Factors:  Sense of responsibility to family;Living with another person, especially a relative;Positive social support  CLINICAL FACTORS:   Depression:   Anhedonia Hopelessness Impulsivity Insomnia Recent sense of peace/wellbeing Severe Unstable or Poor Therapeutic Relationship Previous Psychiatric Diagnoses and Treatments  COGNITIVE FEATURES THAT CONTRIBUTE TO RISK:  Polarized thinking    SUICIDE RISK:   Minimal: No identifiable suicidal ideation.  Patients presenting with no risk factors but with morbid ruminations; may be classified as minimal risk based on the severity of the depressive symptoms  PLAN OF CARE: Admit emergently and voluntarily for depression and crisis stabilization.  I certify that inpatient services furnished can reasonably be expected to improve the patient's condition.  Austin Sawyer,JANARDHAHA R. 04/28/2013, 3:27 PM

## 2013-04-29 LAB — GLUCOSE, CAPILLARY
Glucose-Capillary: 92 mg/dL (ref 70–99)
Glucose-Capillary: 97 mg/dL (ref 70–99)

## 2013-04-29 MED ORDER — DULOXETINE HCL 20 MG PO CPEP
40.0000 mg | ORAL_CAPSULE | Freq: Every day | ORAL | Status: DC
  Administered 2013-04-30 – 2013-05-01 (×2): 40 mg via ORAL
  Filled 2013-04-29 (×4): qty 2

## 2013-04-29 MED ORDER — SIMVASTATIN 80 MG PO TABS
80.0000 mg | ORAL_TABLET | Freq: Every day | ORAL | Status: DC
  Administered 2013-04-29 – 2013-04-30 (×2): 80 mg via ORAL
  Filled 2013-04-29 (×4): qty 1

## 2013-04-29 MED ORDER — EZETIMIBE 10 MG PO TABS
10.0000 mg | ORAL_TABLET | Freq: Every day | ORAL | Status: DC
  Administered 2013-04-29 – 2013-04-30 (×2): 10 mg via ORAL
  Filled 2013-04-29 (×4): qty 1

## 2013-04-29 MED ORDER — MIRTAZAPINE 15 MG PO TABS
15.0000 mg | ORAL_TABLET | Freq: Every evening | ORAL | Status: DC | PRN
  Administered 2013-05-01: 15 mg via ORAL
  Filled 2013-04-29: qty 1

## 2013-04-29 NOTE — Progress Notes (Signed)
Psychoeducational Group Note  Date:  04/29/2013 Time: 1015 Group Topic/Focus:  Making Healthy Choices:   The focus of this group is to help patients identify negative/unhealthy choices they were using prior to admission and identify positive/healthier coping strategies to replace them upon discharge.  Participation Level:  Active  Participation Quality:  Appropriate  Affect:  Appropriate  Cognitive:  Appropriate  Insight:  Engaged  Engagement in Group:  Engaged  Additional Comments:    Rich Brave 3:04 PM. 04/29/2013

## 2013-04-29 NOTE — Progress Notes (Signed)
Patient ID: Austin Sawyer, male   DOB: 07-Jul-1935, 78 y.o.   MRN: [id number] D)  Has been pleasant and cooperative this evening, attended group, interacting appropriately with staff and peers.  Orders were obtained for several meds that he had been on and weren't reordered, had been anxious about not being on his meds, but pleasant and appreciative when waiting on them.A)  Will continue to monitor for safety, continue POC R)  Safety maintained.

## 2013-04-29 NOTE — Progress Notes (Signed)
[facility] MD Progress Note  04/29/2013 1:52 PM Austin Sawyer  MRN:  [id number] Subjective:  He has no complaints today. He has been receiving multiple medication for several medical conditions and also taking medication for depression. He has been in contact with his wife and learned that she fell yesterday morning and learned that she broke a toe. He stated that it is bad news. She is trying to do on her own. His wife is 76 years old. He has slept fine with medication last night. He has depression but no irritable and no agitation.   Diagnosis:  Axis I: Major Depression, Recurrent severe  ADL's:  Intact  Sleep: Fair  Appetite:  Fair  Suicidal Ideation:  denied Homicidal Ideation:  Denied AEB (as evidenced by):  Psychiatric Specialty Exam: ROS  Blood pressure 146/91, pulse 76, temperature 97.9 F (36.6 C), temperature source Oral, resp. rate 22, height 5' 8.5" (1.74 m), weight 96.616 kg (213 lb), SpO2 97.00%.Body mass index is 31.91 kg/(m^2).  General Appearance: Casual, Fairly Groomed and Guarded  Patent attorney::  Fair  Speech:  Clear and Coherent  Volume:  Normal  Mood:  Anxious and Depressed  Affect:  Constricted and Depressed  Thought Process:  Coherent and Goal Directed  Orientation:  Full (Time, Place, and Person)  Thought Content:  NA  Suicidal Thoughts:  No  Homicidal Thoughts:  No  Memory:  Immediate;   Fair  Judgement:  Intact  Insight:  Fair  Psychomotor Activity:  Normal  Concentration:  Fair  Recall:  Fair  Akathisia:  NA  Handed:  Right  AIMS (if indicated):     Assets:  Communication Skills Desire for Improvement Leisure Time Resilience Social Support Transportation  Sleep:  Number of Hours: 6.25   Current Medications: Current Facility-Administered Medications  Medication Dose Route Frequency Provider Last Rate Last Dose  . acetaminophen (TYLENOL) tablet 650 mg  650 mg Oral Q6H PRN Earney Navy, NP      . alum & mag hydroxide-simeth (MAALOX/MYLANTA)  200-200-20 MG/5ML suspension 30 mL  30 mL Oral Q4H PRN Earney Navy, NP      . carvedilol (COREG) tablet 12.5 mg  12.5 mg Oral BID WC Nehemiah Settle, MD   12.5 mg at 04/29/13 0815  . colchicine tablet 0.6 mg  0.6 mg Oral QHS Nehemiah Settle, MD   0.6 mg at 04/28/13 2218  . darifenacin (ENABLEX) 24 hr tablet 7.5 mg  7.5 mg Oral Daily Nehemiah Settle, MD   7.5 mg at 04/29/13 0815  . DULoxetine (CYMBALTA) DR capsule 30 mg  30 mg Oral Daily Nehemiah Settle, MD   30 mg at 04/29/13 0815  . ezetimibe (ZETIA) tablet 10 mg  10 mg Oral q1800 Nehemiah Settle, MD       And  . simvastatin (ZOCOR) tablet 80 mg  80 mg Oral q1800 Nehemiah Settle, MD      . feeding supplement (ENSURE COMPLETE) liquid 237 mL  237 mL Oral BID BM Lavena Bullion, RD   237 mL at 04/29/13 1400  . hydrochlorothiazide (HYDRODIURIL) tablet 25 mg  25 mg Oral Daily Nehemiah Settle, MD   25 mg at 04/29/13 0815  . hydrOXYzine (ATARAX/VISTARIL) tablet 50 mg  50 mg Oral QHS Nehemiah Settle, MD   50 mg at 04/28/13 2159  . irbesartan (AVAPRO) tablet 300 mg  300 mg Oral Daily Nehemiah Settle, MD   300 mg at 04/29/13 0815  .  magnesium hydroxide (MILK OF MAGNESIA) suspension 30 mL  30 mL Oral Daily PRN Earney Navy, NP      . metFORMIN (GLUCOPHAGE-XR) 24 hr tablet 500 mg  500 mg Oral Q breakfast Nehemiah Settle, MD   500 mg at 04/29/13 0815  . mirtazapine (REMERON) tablet 30 mg  30 mg Oral QHS Court Joy, PA-C   30 mg at 04/28/13 2159  . nitroGLYCERIN (NITROSTAT) SL tablet 0.4 mg  0.4 mg Sublingual Q5 min PRN Nehemiah Settle, MD      . tamsulosin [facility]) capsule 0.4 mg  0.4 mg Oral Daily Nehemiah Settle, MD   0.4 mg at 04/29/13 0815    Lab Results:  Results for orders placed during the hospital encounter of 04/27/13 (from the past 48 hour(s))  GLUCOSE, CAPILLARY     Status: Abnormal   Collection Time     04/27/13  5:14 PM      Result Value Range   Glucose-Capillary 100 (*) 70 - 99 mg/dL  TSH     Status: None   Collection Time    04/27/13  7:30 PM      Result Value Range   TSH 1.027  0.350 - 4.500 uIU/mL  GLUCOSE, CAPILLARY     Status: Abnormal   Collection Time    04/28/13  6:17 AM      Result Value Range   Glucose-Capillary 105 (*) 70 - 99 mg/dL  GLUCOSE, CAPILLARY     Status: Abnormal   Collection Time    04/28/13 12:04 PM      Result Value Range   Glucose-Capillary 100 (*) 70 - 99 mg/dL   Comment 1 Notify RN    GLUCOSE, CAPILLARY     Status: Abnormal   Collection Time    04/28/13  5:04 PM      Result Value Range   Glucose-Capillary 112 (*) 70 - 99 mg/dL   Comment 1 Notify RN    GLUCOSE, CAPILLARY     Status: Abnormal   Collection Time    04/28/13  9:38 PM      Result Value Range   Glucose-Capillary 129 (*) 70 - 99 mg/dL   Comment 1 Notify RN    GLUCOSE, CAPILLARY     Status: Abnormal   Collection Time    04/29/13  6:10 AM      Result Value Range   Glucose-Capillary 108 (*) 70 - 99 mg/dL  GLUCOSE, CAPILLARY     Status: Abnormal   Collection Time    04/29/13 11:39 AM      Result Value Range   Glucose-Capillary 116 (*) 70 - 99 mg/dL   Comment 1 Documented in Chart     Comment 2 Notify RN      Physical Findings: AIMS: Facial and Oral Movements Muscles of Facial Expression: None, normal Lips and Perioral Area: None, normal Jaw: None, normal Tongue: None, normal,Extremity Movements Upper (arms, wrists, hands, fingers): None, normal Lower (legs, knees, ankles, toes): None, normal, Trunk Movements Neck, shoulders, hips: None, normal, Overall Severity Severity of abnormal movements (highest score from questions above): None, normal Incapacitation due to abnormal movements: None, normal Patient's awareness of abnormal movements (rate only patient's report): No Awareness, Dental Status Current problems with teeth and/or dentures?: No Does patient usually wear  dentures?: Yes  CIWA:  CIWA-Ar Total: 1 COWS:  COWS Total Score: 1  Treatment Plan Summary: Daily contact with patient to assess and evaluate symptoms and progress in treatment Medication  management  Plan: Treatment Plan/Recommendations:   1. Admit for crisis management and stabilization. 2. Medication management to reduce current symptoms to base line and improve the patient's overall level of functioning. Increase cymbalta and give remeron PRN sleep. 3. Treat health problems as indicated. 4. Develop treatment plan to decrease risk of relapse upon discharge and to reduce the need for readmission. 5. Psycho-social education regarding relapse prevention and self care. 6. Health care follow up as needed for medical problems. 7. Restart home medications where appropriate.   Medical Decision Making Problem Points:  Established problem, worsening (2) and Review of psycho-social stressors (1) Data Points:  Review or order clinical lab tests (1) Review of medication regiment & side effects (2) Review of new medications or change in dosage (2)  I certify that inpatient services furnished can reasonably be expected to improve the patient's condition.   Austin Sawyer,JANARDHAHA R. 04/29/2013, 1:52 PM

## 2013-04-29 NOTE — Progress Notes (Signed)
Psychoeducational Group Note  Date:  04/29/2013 Time:  0930 Group Topic/Focus:  Making Healthy Choices:   The focus of this group is to help patients identify negative/unhealthy choices they were using prior to admission and identify positive/healthier coping strategies to replace them upon discharge.  Participation Level:  Active  Participation Quality:  Appropriate  Affect:  Appropriate  Cognitive:  Oriented  Insight:  Engaged  Engagement in Group:  Engaged  Additional Comments:    Rich Brave 5:02 PM. 04/29/2013

## 2013-04-29 NOTE — [facility] Group Notes (Signed)
[facility] LCSW Group Therapy  04/29/2013   2:30 PM   Type of Therapy:  Group Therapy  Participation Level:  Active  Participation Quality:  Appropriate and Attentive  Affect:  Appropriate, Flat and Depressed  Cognitive:  Alert and Appropriate  Insight:  Developing/Improving and Engaged  Engagement in Therapy:  Developing/Improving and Engaged  Modes of Intervention:  Clarification, Confrontation, Discussion, Education, Exploration, Limit-setting, Orientation, Problem-solving, Rapport Building, Dance movement psychotherapist, Socialization and Support  Summary of Progress/Problems: The main focus of today's process group was to identify the patient's current support system and decide on other supports that can be put in place.  An emphasis was placed on using counselor, doctor, therapy groups, 12-step groups, and problem-specific support groups to expand supports, as well as doing something different than has been done before.  Also discussed the different between natural support (family and friends) vs professional support (psychiatrist and therapist).   Pt was in and out for the duration of group and appeared to be struggling with his hearing aid.  Pt did share that he has a great support system of his family, friends and church family.    Austin Sawyer, LCSWA 04/29/2013 3:10 PM

## 2013-04-29 NOTE — Progress Notes (Signed)
Patient ID: Austin Sawyer, male   DOB: Sep 28, 1935, 78 y.o.   MRN: [id number]  D: Pt has been flat and depressed on the unit today, pt reported that he was having a much better day. Pt reported that he was negative SI/HI, no AH/VH noted.Pt attended all groups and engaged in treatment.  A: 15 min checks continued for patient safety. R: Pts safety maintained.

## 2013-04-30 DIAGNOSIS — F411 Generalized anxiety disorder: Secondary | ICD-10-CM

## 2013-04-30 DIAGNOSIS — F332 Major depressive disorder, recurrent severe without psychotic features: Principal | ICD-10-CM

## 2013-04-30 LAB — GLUCOSE, CAPILLARY
Glucose-Capillary: 132 mg/dL — ABNORMAL HIGH (ref 70–99)
Glucose-Capillary: 98 mg/dL (ref 70–99)

## 2013-04-30 NOTE — Progress Notes (Addendum)
D:  Patient's self inventory sheet, patient sleeps well, good appetite, normal energy level, improving attention span.  Rated depression #6, hopelessness #1.  Has felt agitation in past 24 hours.  Denied SI.  Has had foot injury in the past, sometimes painful.  Worst pain 33.  "Take medicine as prescribed, go to outpatient group here, eat regular meals throughout day."  Has anxiety.  No discharge plans.  No problems taking meds after discharge. A:  Medications administered per MD orders.  Emotional support and encouragement given patient. R:  Denied SI and HI.  Denied A/V hallucinations.  Contracts for safety.  Will continue to monitor for safety with 15 minute checks.  Safety maintained.  Patient's friends, Hessie Diener and wife phone [phone], would like MD/staff to call them.  [facility] are taking care of patient's wife and will continue to take care of pt and his wife after pt's d/c from [facility].  [facility] thought pt was to be discharged today and pt now may be discharged tomorrow.

## 2013-04-30 NOTE — [facility] Group Notes (Signed)
[facility] LCSW Aftercare Discharge Planning Group Note   04/30/2013 10:14 AM  Participation Quality:  Approprite  Mood/Affect:  Appropriate  Depression Rating:  1  Anxiety Rating:  1  Thoughts of Suicide:  No  Will you contract for safety?   NA  Current AVH:  No  Plan for Discharge/Comments:  Patient attending discharge planning group and actively participated in group.  CSW provided all participants with daily workbook and information on services offered by [facility].  Transportation Means: Patient has transportation.   Supports:  Patient has a good support system.   Roderick Calo, Joesph July

## 2013-04-30 NOTE — Progress Notes (Signed)
[facility] MD Progress Note  04/30/2013 1:20 PM Austin Sawyer  MRN:  [id number] Subjective:  Patient's depression and anxiety have improved but he feels he needs another day before going home to face the stressors there, denies suicidal ideations.  He is the main caregiver of his wife and was overwhelmed with the stress of his own chronic health problems and taking care of her.  The case manager assisted him to call his wife's MD to see if he would write the need for her to get home health to assist in her care.  He has established someone from his church to care for her at times to give him a break; also has assistance from his church family for emotional support.  Appetite and sleep have improved, denies physical discomforts. Diagnosis:   Axis I: Anxiety Disorder NOS and Major Depression, Recurrent severe Axis II: Deferred Axis III:  Past Medical History  Diagnosis Date  . Hypertension   . Coronary artery disease   . Diabetes mellitus without complication   . MI, old   . Reflux   . Depression    Axis IV: other psychosocial or environmental problems, problems related to social environment and problems with primary support group Axis V: 41-50 serious symptoms  ADL's:  Intact  Sleep: Fair  Appetite:  Fair  Suicidal Ideation:  Denies Homicidal Ideation:  Denies  Psychiatric Specialty Exam: Review of Systems  Constitutional: Negative.   HENT: Negative.   Eyes: Negative.   Respiratory: Negative.   Cardiovascular: Negative.   Gastrointestinal: Negative.   Genitourinary: Negative.   Musculoskeletal: Negative.   Skin: Negative.   Neurological: Negative.   Endo/Heme/Allergies: Negative.   Psychiatric/Behavioral: Positive for depression. The patient is nervous/anxious.     Blood pressure 108/69, pulse 106, temperature 98.3 F (36.8 C), temperature source Oral, resp. rate 18, height 5' 8.5" (1.74 m), weight 96.616 kg (213 lb), SpO2 97.00%.Body mass index is 31.91 kg/(m^2).  General  Appearance: Casual  Eye Contact::  Fair  Speech:  Normal Rate  Volume:  Normal  Mood:  Anxious and Depressed  Affect:  Congruent  Thought Process:  Coherent  Orientation:  Full (Time, Place, and Person)  Thought Content:  WDL  Suicidal Thoughts:  No  Homicidal Thoughts:  No  Memory:  Immediate;   Fair Recent;   Fair Remote;   Fair  Judgement:  Fair  Insight:  Fair  Psychomotor Activity:  Decreased  Concentration:  Fair  Recall:  Fair  Akathisia:  No  Handed:  Right  AIMS (if indicated):     Assets:  Communication Skills Resilience Social Support  Sleep:  Number of Hours: 6.25   Current Medications: Current Facility-Administered Medications  Medication Dose Route Frequency Provider Last Rate Last Dose  . acetaminophen (TYLENOL) tablet 650 mg  650 mg Oral Q6H PRN Earney Navy, NP      . alum & mag hydroxide-simeth (MAALOX/MYLANTA) 200-200-20 MG/5ML suspension 30 mL  30 mL Oral Q4H PRN Earney Navy, NP      . carvedilol (COREG) tablet 12.5 mg  12.5 mg Oral BID WC Nehemiah Settle, MD   12.5 mg at 04/30/13 0817  . colchicine tablet 0.6 mg  0.6 mg Oral QHS Nehemiah Settle, MD   0.6 mg at 04/29/13 2148  . darifenacin (ENABLEX) 24 hr tablet 7.5 mg  7.5 mg Oral Daily Nehemiah Settle, MD   7.5 mg at 04/30/13 0818  . DULoxetine (CYMBALTA) DR capsule 40 mg  40 mg  Oral Daily Nehemiah Settle, MD   40 mg at 04/30/13 0818  . ezetimibe (ZETIA) tablet 10 mg  10 mg Oral q1800 Nehemiah Settle, MD   10 mg at 04/29/13 1628   And  . simvastatin (ZOCOR) tablet 80 mg  80 mg Oral q1800 Nehemiah Settle, MD   80 mg at 04/29/13 1627  . feeding supplement (ENSURE COMPLETE) liquid 237 mL  237 mL Oral BID BM Lavena Bullion, RD   237 mL at 04/30/13 1053  . hydrochlorothiazide (HYDRODIURIL) tablet 25 mg  25 mg Oral Daily Nehemiah Settle, MD   25 mg at 04/30/13 0819  . hydrOXYzine (ATARAX/VISTARIL) tablet 50 mg  50 mg Oral QHS  Nehemiah Settle, MD   50 mg at 04/29/13 2146  . irbesartan (AVAPRO) tablet 300 mg  300 mg Oral Daily Nehemiah Settle, MD   300 mg at 04/30/13 0819  . magnesium hydroxide (MILK OF MAGNESIA) suspension 30 mL  30 mL Oral Daily PRN Earney Navy, NP      . metFORMIN (GLUCOPHAGE-XR) 24 hr tablet 500 mg  500 mg Oral Q breakfast Nehemiah Settle, MD   500 mg at 04/30/13 0819  . mirtazapine (REMERON) tablet 15 mg  15 mg Oral QHS PRN Nehemiah Settle, MD      . nitroGLYCERIN (NITROSTAT) SL tablet 0.4 mg  0.4 mg Sublingual Q5 min PRN Nehemiah Settle, MD      . tamsulosin (FLOMAX) capsule 0.4 mg  0.4 mg Oral Daily Nehemiah Settle, MD   0.4 mg at 04/30/13 0820    Lab Results:  Results for orders placed during the hospital encounter of 04/27/13 (from the past 48 hour(s))  GLUCOSE, CAPILLARY     Status: Abnormal   Collection Time    04/28/13  5:04 PM      Result Value Range   Glucose-Capillary 112 (*) 70 - 99 mg/dL   Comment 1 Notify RN    GLUCOSE, CAPILLARY     Status: Abnormal   Collection Time    04/28/13  9:38 PM      Result Value Range   Glucose-Capillary 129 (*) 70 - 99 mg/dL   Comment 1 Notify RN    GLUCOSE, CAPILLARY     Status: Abnormal   Collection Time    04/29/13  6:10 AM      Result Value Range   Glucose-Capillary 108 (*) 70 - 99 mg/dL  GLUCOSE, CAPILLARY     Status: Abnormal   Collection Time    04/29/13 11:39 AM      Result Value Range   Glucose-Capillary 116 (*) 70 - 99 mg/dL   Comment 1 Documented in Chart     Comment 2 Notify RN    GLUCOSE, CAPILLARY     Status: None   Collection Time    04/29/13  4:35 PM      Result Value Range   Glucose-Capillary 92  70 - 99 mg/dL   Comment 1 Notify RN    GLUCOSE, CAPILLARY     Status: None   Collection Time    04/29/13  8:50 PM      Result Value Range   Glucose-Capillary 97  70 - 99 mg/dL  GLUCOSE, CAPILLARY     Status: None   Collection Time    04/30/13  6:17 AM       Result Value Range   Glucose-Capillary 86  70 - 99 mg/dL  GLUCOSE, CAPILLARY  Status: Abnormal   Collection Time    04/30/13 12:11 PM      Result Value Range   Glucose-Capillary 132 (*) 70 - 99 mg/dL    Physical Findings: AIMS: Facial and Oral Movements Muscles of Facial Expression: None, normal Lips and Perioral Area: None, normal Jaw: None, normal Tongue: None, normal,Extremity Movements Upper (arms, wrists, hands, fingers): None, normal Lower (legs, knees, ankles, toes): None, normal, Trunk Movements Neck, shoulders, hips: None, normal, Overall Severity Severity of abnormal movements (highest score from questions above): None, normal Incapacitation due to abnormal movements: None, normal Patient's awareness of abnormal movements (rate only patient's report): No Awareness, Dental Status Current problems with teeth and/or dentures?: No Does patient usually wear dentures?: No  CIWA:  CIWA-Ar Total: 1 COWS:  COWS Total Score: 3  Treatment Plan Summary: Daily contact with patient to assess and evaluate symptoms and progress in treatment Medication management  Plan:  Medical Decision Making Problem Points:  Established problem, stable/improving (1) and Review of psycho-social stressors (1) Data Points:  Review of medication regiment & side effects (2)  I certify that inpatient services furnished can reasonably be expected to improve the patient's condition.   Nanine Means, PMH-NP 04/30/2013, 1:20 PM

## 2013-04-30 NOTE — Progress Notes (Signed)
Grief and Loss Group  Group members discussed their experiences and emotions surrounding grief and loss.  Pt was present and engaged for entire group, but did not respond verbally. He left the group after the first 15 minutes.  Austin Sawyer [facility] Counseling Intern

## 2013-04-30 NOTE — Tx Team (Signed)
Interdisciplinary Treatment Plan Update   Date Reviewed:  04/30/2013  Time Reviewed:  9:37 AM  Progress in Treatment:   Attending groups: Yes Participating in groups: Yes Taking medication as prescribed: Yes  Tolerating medication: Yes Family/Significant other contact made: No, but will ask patient for consent for collateral contact Patient understands diagnosis: Yes  Discussing patient identified problems/goals with staff: Yes Medical problems stabilized or resolved: Yes Denies suicidal/homicidal ideation: Yes Patient has not harmed self or others: Yes  For review of initial/current patient goals, please see plan of care.  Estimated Length of Stay:  Discharge home today  Reasons for Continued Hospitalization:  Anxiety Depression Medication stabilization Suicidal ideation  New Problems/Goals identified:    Discharge Plan or Barriers:   Home with outpatient follow up  Additional Comments: N/A  Attendees:  Patient:  04/30/2013 9:37 AM   Signature: Mervyn Gay, MD 04/30/2013 9:37 AM  Signature:  Verne Spurr, PA 04/30/2013 9:37 AM  Signature: 04/30/2013 9:37 AM  Signature:Beverly Terrilee Croak, RN 04/30/2013 9:37 AM  Signature:  Neill Loft RN 04/30/2013 9:37 AM  Signature:  Juline Patch, LCSW 04/30/2013 9:37 AM  Signature:   04/30/2013 9:37 AM  Signature:  Maseta Dorley,Care Coordinator 04/30/2013 9:37 AM  Signature: 04/30/2013 9:37 AM  Signature:    Signature:    Signature:      Scribe for Treatment Team:   Juline Patch,  04/30/2013 9:37 AM

## 2013-04-30 NOTE — [facility] Group Notes (Signed)
[facility] LCSW Group Therapy          Overcoming Obstacles       1:15 -2:30        04/30/2013   12:36 PM     Type of Therapy:  Group Therapy  Participation Level:  Appropriate  Participation Quality:  Appropriate  Affect:  Appropriate, Alert  Cognitive:  Attentive Appropriate  Insight: Developing/Improving Engaged  Engagement in Therapy: Developing/Imprvoing Engaged  Modes of Intervention:  Discussion Exploration  Education Rapport BuildingProblem-Solving Support  Summary of Progress/Problems:  The main focus of today's group was overcoming  Obstacles.  Patient shared the obstacle he has to overcome is getting help in taking care of his wife.  He stated he is working to overcome the obstacle.   Wynn Banker 04/30/2013    12:36 PM     mo

## 2013-04-30 NOTE — Progress Notes (Signed)
Adult Psychoeducational Group Note  Date:  04/30/2013 Time:  10:36 PM  Group Topic/Focus:  Wrap-Up Group:   The focus of this group is to help patients review their daily goal of treatment and discuss progress on daily workbooks.  Participation Level:  Minimal  Participation Quality:  Attentive  Affect:  Flat  Cognitive:  Confused  Insight: Lacking  Engagement in Group:  Developing/Improving and Limited  Modes of Intervention:  Discussion, Education and Exploration  Additional Comments:  Pt stated he has been enlightened by what was said in group. He was unable to identify any specifics.  Reynolds Bowl 04/30/2013, 10:36 PM

## 2013-04-30 NOTE — [facility] Suicide Risk Assessment (Signed)
[facility] INPATIENT:  Family/Significant Other Suicide Prevention Education  Suicide Prevention Education:  Education Completed; Pollie Meyer, Friend, [phone];  has been identified by the patient as the family member/significant other with whom the patient will be residing, and identified as the person(s) who will aid the patient in the event of a mental health crisis (suicidal ideations/suicide attempt).  With written consent from the patient, the family member/significant other has been provided the following suicide prevention education, prior to the and/or following the discharge of the patient.  The suicide prevention education provided includes the following:  Suicide risk factors  Suicide prevention and interventions  National Suicide Hotline telephone number  [facility] assessment telephone number  [facility] Emergency Assistance 911  [facility] and/or Residential Mobile Crisis Unit telephone number  Request made of family/significant other to:  Remove weapons (e.g., guns, rifles, knives), all items previously/currently identified as safety concern.  Friends advised there are no guns to her knowledge and if there are any guns, they wills secure them.  Remove drugs/medications (over-the-counter, prescriptions, illicit drugs), all items previously/currently identified as a safety concern.  The family member/significant other verbalizes understanding of the suicide prevention education information provided.  The family member/significant other agrees to remove the items of safety concern listed above.  Wynn Banker 04/30/2013, 11:08 AM

## 2013-04-30 NOTE — Progress Notes (Signed)
Patient ID: Austin Sawyer, male   DOB: 09/14/1935, 78 y.o.   MRN: [id number] D: Pt. Says "so far, so good", reports "learned about fear" Pt. Says he hopes to go home tomorrow. A: Writer introduced self to client and encouraged group. Staff will monitor q63min for safety. R: Pt. Is safe on the unit and attended group.

## 2013-04-30 NOTE — Progress Notes (Signed)
Patient ID: Austin Sawyer, male   DOB: 09/06/1935, 78 y.o.   MRN: [id number] D)  Was out and about on the hall at intervals this evening, attended group. Stated he is worried about his wife, stated a friend from church has been staying with her while he is here, but she fell this morning and may have broken her toe, is swollen.  Has been pleasant, cooperative, compliant with meds and diet.  Interacting appropriately with select peers and staff but his difficulty hearing causes him to be slow to be involved in much conversation.  A)  Will continue to monitor for safety, continue POC R)  Safety maintained.

## 2013-05-01 MED ORDER — TAMSULOSIN HCL 0.4 MG PO CAPS: 0.4000 mg | ORAL_CAPSULE | Freq: Every day | ORAL | Status: DC

## 2013-05-01 MED ORDER — COLCHICINE 0.6 MG PO TABS: 0.6000 mg | ORAL_TABLET | Freq: Every day | ORAL | Status: DC

## 2013-05-01 MED ORDER — DULOXETINE HCL 20 MG PO CPEP: 40.0000 mg | ORAL_CAPSULE | Freq: Every day | ORAL | Status: DC

## 2013-05-01 MED ORDER — CARVEDILOL 12.5 MG PO TABS: 12.5000 mg | ORAL_TABLET | Freq: Two times a day (BID) | ORAL | Status: DC

## 2013-05-01 MED ORDER — METFORMIN HCL ER 500 MG PO TB24: 500.0000 mg | ORAL_TABLET | Freq: Every day | ORAL | Status: DC

## 2013-05-01 MED ORDER — HYDROXYZINE PAMOATE 50 MG PO CAPS: 50.0000 mg | ORAL_CAPSULE | Freq: Every day | ORAL | Status: DC

## 2013-05-01 MED ORDER — B COMPLEX-C PO TABS: 1.0000 | ORAL_TABLET | Freq: Every day | ORAL | Status: DC

## 2013-05-01 MED ORDER — MIRTAZAPINE 15 MG PO TABS: 15.0000 mg | ORAL_TABLET | Freq: Every evening | ORAL | Status: DC | PRN

## 2013-05-01 MED ORDER — SOLIFENACIN SUCCINATE 5 MG PO TABS: 10.0000 mg | ORAL_TABLET | Freq: Every day | ORAL | Status: DC

## 2013-05-01 MED ORDER — TELMISARTAN-HCTZ 80-25 MG PO TABS: 1.0000 | ORAL_TABLET | Freq: Every day | ORAL | Status: DC

## 2013-05-01 MED ORDER — DARIFENACIN HYDROBROMIDE ER 7.5 MG PO TB24: 7.5000 mg | ORAL_TABLET | Freq: Every day | ORAL | Status: DC

## 2013-05-01 MED ORDER — EZETIMIBE-SIMVASTATIN 10-80 MG PO TABS: 1.0000 | ORAL_TABLET | Freq: Every day | ORAL | Status: DC

## 2013-05-01 NOTE — Progress Notes (Signed)
Recreation Therapy Notes  Date: 07.28.2014 Time: 3:00pm Location: [address] Dayroom  Group Topic: Problem Solving, Communication, Team Work  Goal Area(s) Addresses:  Patient will effectively work in a team with other group members. Patient will verbalize skills needed to make activity successful.  Patient will verbalize ways to use skills used in group session post d/c.  Behavioral Response: Engaged, Appropriate, Attentive, Disappointed   Intervention: Problem Solving Activity  Activity: [employer]. Patients were given 20 spaghetti sticks and 11 marshmallows. Within teams patients were asked to build the tallest free standing structure.  Education: Customer service manager, Pharmacologist, Discharge Planning  Education Outcome: Acknowledges understanding  Clinical Observations/Feedback: Patient actively engaged in group activity. Patient group let one individual take the lead for this project. Patient appeared to follow direction given by peer and interact well with team mates. Patient group ultimately unsuccessful at building a freestanding structure. Upon finishing activity patient could be seen shaking his head "no" and appeared defeated that his group was not successful at activity. Patient made no contributions to opening or wrap up discussion, but appeared to actively listen by following conversation, maintaining appropriate eye contact and nodding in agreement with statements made by peers or LRT.   Marykay Lex Andrya Roppolo, LRT/CTRS  Nevin Grizzle L 05/01/2013 8:21 AM

## 2013-05-01 NOTE — Progress Notes (Signed)
D:  Patient's self inventory sheet, patient has fair sleep, improving appetite, normal energy level, good attention span.  Rated depression, hopelessness and anxiety #1.  Denied withdrawals.  Denied SI.  Denied physical problems.  Zero pain.  Will have relatives, friends to support him and wife, plans to take better care of himself, take meds, go to MD appts. A:  Medications administered per MD orders.  Emotional support and encouragement given patient. R:  Denied SI and HI.  Denied A/V hallucinations.  Safety maintained with 15 minute checks.

## 2013-05-01 NOTE — Progress Notes (Signed)
Discharge Note:  Patient picked up by friends who are taking patient to his home.  Patient denied SI and HI.  Denied A/V hallucinations.  Denied pain.  Patient stated he appreciated all assistance received from [facility] staff.  Suicide prevention information given and discussed with patient who stated he understood and had no questions.  Patient received all his belongings, shoes, hat, wallet, prescriptions, miscellaneous items, toiletries.  Patient has been cooperative and pleasant.

## 2013-05-01 NOTE — Discharge Summary (Signed)
Physician Discharge Summary Note  Patient:  Austin Sawyer is an 78 y.o., male MRN:  [id number] DOB:  06-06-1935 Patient phone:  [phone] (home)  Patient address:   [address] [place] [address],   Date of Admission:  04/27/2013 Date of Discharge: 05/01/2013  Reason for Admission:  Depression with suicidal ideations  Discharge Diagnoses: Principal Problem:   Major depressive disorder, recurrent episode, severe, without mention of psychotic behavior  Review of Systems  Constitutional: Negative.   HENT: Negative.   Eyes: Negative.   Respiratory: Negative.   Cardiovascular: Negative.   Gastrointestinal: Negative.   Genitourinary: Negative.   Skin: Negative.   Neurological: Negative.   Endo/Heme/Allergies: Negative.   Psychiatric/Behavioral: Positive for depression. The patient is nervous/anxious.    Axis Diagnosis:   AXIS I:  Anxiety Disorder NOS and Major Depression, Recurrent severe AXIS II:  Deferred AXIS III:   Past Medical History  Diagnosis Date  . Hypertension   . Coronary artery disease   . Diabetes mellitus without complication   . MI, old   . Reflux   . Depression    AXIS IV:  other psychosocial or environmental problems, problems related to social environment and problems with primary support group AXIS V:  61-70 mild symptoms  Level of Care:  OP  Hospital Course:  On admission:  78 y.o. married black male. He is referred by his primary Psychiatrist, Archer Asa, MD, for worsening symptoms of depression. Pt reports that his depression has worsened over the past 3 weeks. The spouse has significant health problems, including residual impairment from a stoke in 2009, as well as worsening diabetes. He is now so involved with taking care of the spouse and the household that he neglects his own wellbeing. He reports that his spouse goes into a "coma" with increasing frequency due to low blood sugar, requiring his immediate intervention. He has been  trying to obtain home health services, but is running into obstacles securing this - All of these factors have contributed to his worsening depression. He endorses depressed mood with symptoms noted in the "risk to self" . He also reports poor appetite with 8 lb. weight loss over the past 3 weeks. Pt reports SI as well, and while he denies having a plan, he fears that he could end up taking his life if not for the professional supports on which he depends. He has had thoughts of overdosing on his medications, or poisoning himself with alcohol. He has history of previous admission for alcohol dependence. He has a history of abusing alcohol for more that 30 years. He has been seeing Dr Donell Beers for outpatient psychiatry for the past four years.  During hospitalization:  Medication managed--His medical medications were continued during inpatient.  His Cymbalta 30 mg daily was increased to 40 mg daily for his depression and his Remeron 30 mg at bedtime for sleep was decreased to 15 mg.  He attended and participated in groups during inpatient.  His church family helped with his wife and will continue to give him needed breaks after discharge to reduce his stress.  Patient denied suicidal/homicidal ideations and auditory/visual hallucinations, follow-up appointments encouraged to attend, Rx given.  Etienne is mentally and physically stable for discharge.  Consults:  None  Significant Diagnostic Studies:  labs: Completed in ED, reviewed, stable  Discharge Vitals:   Blood pressure 111/75, pulse 73, temperature 98.3 F (36.8 C), temperature source Oral, resp. rate 18, height 5' 8.5" (1.74 m), weight 96.616 kg (213 lb),  SpO2 97.00%. Body mass index is 31.91 kg/(m^2). Lab Results:   Results for orders placed during the hospital encounter of 04/27/13 (from the past 72 hour(s))  GLUCOSE, CAPILLARY     Status: Abnormal   Collection Time    04/28/13 12:04 PM      Result Value Range   Glucose-Capillary 100 (*) 70 - 99  mg/dL   Comment 1 Notify RN    GLUCOSE, CAPILLARY     Status: Abnormal   Collection Time    04/28/13  5:04 PM      Result Value Range   Glucose-Capillary 112 (*) 70 - 99 mg/dL   Comment 1 Notify RN    GLUCOSE, CAPILLARY     Status: Abnormal   Collection Time    04/28/13  9:38 PM      Result Value Range   Glucose-Capillary 129 (*) 70 - 99 mg/dL   Comment 1 Notify RN    GLUCOSE, CAPILLARY     Status: Abnormal   Collection Time    04/29/13  6:10 AM      Result Value Range   Glucose-Capillary 108 (*) 70 - 99 mg/dL  GLUCOSE, CAPILLARY     Status: Abnormal   Collection Time    04/29/13 11:39 AM      Result Value Range   Glucose-Capillary 116 (*) 70 - 99 mg/dL   Comment 1 Documented in Chart     Comment 2 Notify RN    GLUCOSE, CAPILLARY     Status: None   Collection Time    04/29/13  4:35 PM      Result Value Range   Glucose-Capillary 92  70 - 99 mg/dL   Comment 1 Notify RN    GLUCOSE, CAPILLARY     Status: None   Collection Time    04/29/13  8:50 PM      Result Value Range   Glucose-Capillary 97  70 - 99 mg/dL  GLUCOSE, CAPILLARY     Status: None   Collection Time    04/30/13  6:17 AM      Result Value Range   Glucose-Capillary 86  70 - 99 mg/dL  GLUCOSE, CAPILLARY     Status: Abnormal   Collection Time    04/30/13 12:11 PM      Result Value Range   Glucose-Capillary 132 (*) 70 - 99 mg/dL  GLUCOSE, CAPILLARY     Status: None   Collection Time    04/30/13  4:56 PM      Result Value Range   Glucose-Capillary 98  70 - 99 mg/dL   Comment 1 Notify RN    GLUCOSE, CAPILLARY     Status: Abnormal   Collection Time    04/30/13  8:35 PM      Result Value Range   Glucose-Capillary 115 (*) 70 - 99 mg/dL  GLUCOSE, CAPILLARY     Status: None   Collection Time    05/01/13  6:36 AM      Result Value Range   Glucose-Capillary 96  70 - 99 mg/dL    Physical Findings: AIMS: Facial and Oral Movements Muscles of Facial Expression: None, normal Lips and Perioral Area: None,  normal Jaw: None, normal Tongue: None, normal,Extremity Movements Upper (arms, wrists, hands, fingers): None, normal Lower (legs, knees, ankles, toes): None, normal, Trunk Movements Neck, shoulders, hips: None, normal, Overall Severity Severity of abnormal movements (highest score from questions above): None, normal Incapacitation due to abnormal movements: None, normal Patient's awareness of  abnormal movements (rate only patient's report): No Awareness, Dental Status Current problems with teeth and/or dentures?: No Does patient usually wear dentures?: No  CIWA:  CIWA-Ar Total: 1 COWS:  COWS Total Score: 3  Psychiatric Specialty Exam: See Psychiatric Specialty Exam and Suicide Risk Assessment completed by Attending Physician prior to discharge.  Discharge destination:  Home  Is patient on multiple antipsychotic therapies at discharge:  No   Has Patient had three or more failed trials of antipsychotic monotherapy by history:  No  Recommended Plan for Multiple Antipsychotic Therapies: N/A  Discharge Orders   Future Orders Complete By Expires     Activity as tolerated - No restrictions  As directed     Diet - low sodium heart healthy  As directed         Medication List       Indication   B-complex with vitamin C tablet  Take 1 tablet by mouth daily.   Indication:  Vitamin Deficiency     carvedilol 12.5 MG tablet  Commonly known as:  COREG  Take 1 tablet (12.5 mg total) by mouth 2 (two) times daily with a meal.   Indication:  High Blood Pressure of Unknown Cause     colchicine 0.6 MG tablet  Take 1 tablet (0.6 mg total) by mouth daily.   Indication:  Joint Inflammation due to Gout     darifenacin 7.5 MG 24 hr tablet  Commonly known as:  ENABLEX  Take 1 tablet (7.5 mg total) by mouth daily.   Indication:  Overactive Bladder     DULoxetine 20 MG capsule  Commonly known as:  CYMBALTA  Take 2 capsules (40 mg total) by mouth daily.   Indication:  Major Depressive  Disorder     ezetimibe-simvastatin 10-80 MG per tablet  Commonly known as:  VYTORIN  Take 1 tablet by mouth at bedtime.   Indication:  hyperlipidemia     hydrOXYzine 50 MG capsule  Commonly known as:  VISTARIL  Take 1 capsule (50 mg total) by mouth at bedtime.   Indication:  Anxiety Neurosis     metFORMIN 500 MG 24 hr tablet  Commonly known as:  GLUCOPHAGE-XR  Take 1 tablet (500 mg total) by mouth daily with breakfast.   Indication:  Type 2 Diabetes     mirtazapine 15 MG tablet  Commonly known as:  REMERON  Take 1 tablet (15 mg total) by mouth at bedtime as needed (insomia).   Indication:  Trouble Sleeping     nitroGLYCERIN 0.4 MG SL tablet  Commonly known as:  NITROSTAT  Place 0.4 mg under the tongue every 5 (five) minutes as needed. Chest pain      solifenacin 5 MG tablet  Commonly known as:  VESICARE  Take 2 tablets (10 mg total) by mouth daily.   Indication:  Overactive Bladder     tamsulosin 0.4 MG Caps  Commonly known as:  FLOMAX  Take 1 capsule (0.4 mg total) by mouth daily.   Indication:  Enlarged Prostate with Urination Problems     telmisartan-hydrochlorothiazide 80-25 MG per tablet  Commonly known as:  MICARDIS HCT  Take 1 tablet by mouth daily.   Indication:  High Blood Pressure           Follow-up Information   Follow up with Dr. Donell Beers - [facility] On 05/31/2013. (Appointment scheduled at 10:30 am with Dr. Donell Beers for medication management)    Contact information:   [phone] [address] [place], [place]  [address]   [phone]      Follow-up recommendations:  Activity as tolerated, low-sodium & low-carbohydrate diet  Comments:  Patient will continue his care out-patient with Dr. Donell Beers.  Total Discharge Time:  Greater than 30 minutes.  SignedNanine Means, PMH-NP 05/01/2013, 10:25 AM  Patient is seen and evaluated face to face and case discussed with physician extender and developed treatment plan. Reviewed the information documented  and agree with the treatment plan.  Kimyatta Lecy,JANARDHAHA R. 05/04/2013 6:58 PM

## 2013-05-01 NOTE — Progress Notes (Signed)
The focus of this group is to educate the patient on the purpose and policies of crisis stabilization and provide a format to answer questions about their admission.  The group details unit policies and expectations of patients while admitted.  Patient attended 0900 RN orientation group.  Patient had active participation, appropriate affect, alert, appropriate insight, appropriate engagement.  Patient will work on goals for discharge.

## 2013-05-01 NOTE — [facility] Suicide Risk Assessment (Signed)
Suicide Risk Assessment  Discharge Assessment     Demographic Factors:  Male and Age 78 or older  Mental Status Per Nursing Assessment::   On Admission:  NA  Current Mental Status by Physician: Patient has good mood and bright affect. he has normal speech and thought process. he has no suicidal or homicidal ideations, intention or plans.   Loss Factors: Financial problems/change in socioeconomic status  Historical Factors: NA  Risk Reduction Factors:   Sense of responsibility to family, Religious beliefs about death, Living with another person, especially a relative, Positive social support, Positive therapeutic relationship and Positive coping skills or problem solving skills  Continued Clinical Symptoms:  Depression:   Insomnia Recent sense of peace/wellbeing  Cognitive Features That Contribute To Risk:  Polarized thinking    Suicide Risk:  Minimal: No identifiable suicidal ideation.  Patients presenting with no risk factors but with morbid ruminations; may be classified as minimal risk based on the severity of the depressive symptoms  Discharge Diagnoses:   AXIS I:  Major Depression, Recurrent severe AXIS II:  Deferred AXIS III:   Past Medical History  Diagnosis Date  . Hypertension   . Coronary artery disease   . Diabetes mellitus without complication   . MI, old   . Reflux   . Depression    AXIS IV:  other psychosocial or environmental problems and problems related to social environment AXIS V:  61-70 mild symptoms  Plan Of Care/Follow-up recommendations:  Activity:  as tolerated Diet:  Regular  Is patient on multiple antipsychotic therapies at discharge:  No   Has Patient had three or more failed trials of antipsychotic monotherapy by history:  No  Recommended Plan for Multiple Antipsychotic Therapies: Not applicable  Aralynn Brake,JANARDHAHA R. 05/01/2013, 10:14 AM

## 2013-05-01 NOTE — Progress Notes (Signed)
[facility] Adult Case Management Discharge Plan :  Will you be returning to the same living situation after discharge: Yes,  returning home At discharge, do you have transportation home?:Yes,  access to transportation Do you have the ability to pay for your medications:Yes,  access to meds  Release of information consent forms completed and in the chart;  Patient's signature needed at discharge.  Patient to Follow up at: Follow-up Information   Follow up with Dr. Donell Beers - [facility] On 05/31/2013. (Appointment scheduled at 10:30 am with Dr. Donell Beers for medication management)    Contact information:   [address] [place], [place]   [address]   [phone]      Patient denies SI/HI:   Yes,  denies SI/HI    Safety Planning and Suicide Prevention discussed:  Yes,  discussed with pt and pt's friend.  See suicide prevention note.  Carmina Miller 05/01/2013, 10:24 AM

## 2013-05-01 NOTE — Progress Notes (Signed)
Patient ID: Austin Sawyer, male   DOB: [birth year], 78 y.o.   MRN: [id number]  WRITER SPOKE WITH [name] AS HE HAS STATED THAT TAKING CARE OF HIS WIFE IS ONE OF HIS STRESSORS.  I HAVE SPOKE WITH HIS WIFE'S PCP DR. PAZ AND THEY HAVE ORDERED AN RN TO COME OUT TO THE HOME AND EVALUATE HER FROM [facility].  THE NURSE WILL CALL THE HOME AND ARRANGE A GOOD TIME FOR THE TWO OF THEM.  I HAVE GIVEN [name] ALL OF THE ABOVE INFORMATION AND HE IS VERY APPRECIATIVE.

## 2013-05-02 NOTE — Progress Notes (Signed)
Agree with assessment and plan Annastacia Duba A. Aissata Wilmore, M.D. 

## 2013-05-04 NOTE — Progress Notes (Signed)
Patient Discharge Instructions:  After Visit Summary (AVS):   Faxed to:  05/04/13 Discharge Summary Note:   Faxed to:  05/04/13 Psychiatric Admission Assessment Note:   Faxed to:  05/04/13 Suicide Risk Assessment - Discharge Assessment:   Faxed to:  05/04/13 Faxed/Sent to the Next Level Care provider:  05/04/13 Faxed to [facility] @ [phone]  Jerelene Redden, 05/04/2013, 3:24 PM

## 2013-05-08 DIAGNOSIS — I10 Essential (primary) hypertension: Secondary | ICD-10-CM | POA: Diagnosis not present

## 2013-05-08 DIAGNOSIS — F411 Generalized anxiety disorder: Secondary | ICD-10-CM | POA: Diagnosis not present

## 2013-05-08 DIAGNOSIS — M109 Gout, unspecified: Secondary | ICD-10-CM | POA: Diagnosis not present

## 2013-05-08 DIAGNOSIS — M545 Low back pain: Secondary | ICD-10-CM | POA: Diagnosis not present

## 2013-05-08 DIAGNOSIS — N318 Other neuromuscular dysfunction of bladder: Secondary | ICD-10-CM | POA: Diagnosis not present

## 2013-05-08 DIAGNOSIS — E119 Type 2 diabetes mellitus without complications: Secondary | ICD-10-CM | POA: Diagnosis not present

## 2013-05-08 DIAGNOSIS — F329 Major depressive disorder, single episode, unspecified: Secondary | ICD-10-CM | POA: Diagnosis not present

## 2013-05-08 DIAGNOSIS — T7840XA Allergy, unspecified, initial encounter: Secondary | ICD-10-CM | POA: Diagnosis not present

## 2013-05-21 DIAGNOSIS — F0390 Unspecified dementia without behavioral disturbance: Secondary | ICD-10-CM | POA: Diagnosis not present

## 2013-05-24 DIAGNOSIS — I10 Essential (primary) hypertension: Secondary | ICD-10-CM | POA: Diagnosis not present

## 2013-05-24 DIAGNOSIS — F329 Major depressive disorder, single episode, unspecified: Secondary | ICD-10-CM | POA: Diagnosis not present

## 2013-05-24 DIAGNOSIS — N318 Other neuromuscular dysfunction of bladder: Secondary | ICD-10-CM | POA: Diagnosis not present

## 2013-05-24 DIAGNOSIS — M545 Low back pain: Secondary | ICD-10-CM | POA: Diagnosis not present

## 2013-05-24 DIAGNOSIS — E119 Type 2 diabetes mellitus without complications: Secondary | ICD-10-CM | POA: Diagnosis not present

## 2013-05-24 DIAGNOSIS — T7840XA Allergy, unspecified, initial encounter: Secondary | ICD-10-CM | POA: Diagnosis not present

## 2013-05-24 DIAGNOSIS — M109 Gout, unspecified: Secondary | ICD-10-CM | POA: Diagnosis not present

## 2013-05-24 DIAGNOSIS — F411 Generalized anxiety disorder: Secondary | ICD-10-CM | POA: Diagnosis not present

## 2013-06-07 DIAGNOSIS — F329 Major depressive disorder, single episode, unspecified: Secondary | ICD-10-CM | POA: Diagnosis not present

## 2013-06-07 DIAGNOSIS — F411 Generalized anxiety disorder: Secondary | ICD-10-CM | POA: Diagnosis not present

## 2013-06-07 DIAGNOSIS — E119 Type 2 diabetes mellitus without complications: Secondary | ICD-10-CM | POA: Diagnosis not present

## 2013-06-07 DIAGNOSIS — M545 Low back pain: Secondary | ICD-10-CM | POA: Diagnosis not present

## 2013-06-07 DIAGNOSIS — Z23 Encounter for immunization: Secondary | ICD-10-CM | POA: Diagnosis not present

## 2013-06-07 DIAGNOSIS — I10 Essential (primary) hypertension: Secondary | ICD-10-CM | POA: Diagnosis not present

## 2013-06-07 DIAGNOSIS — T7840XA Allergy, unspecified, initial encounter: Secondary | ICD-10-CM | POA: Diagnosis not present

## 2013-06-07 DIAGNOSIS — M109 Gout, unspecified: Secondary | ICD-10-CM | POA: Diagnosis not present

## 2013-06-11 DIAGNOSIS — R3915 Urgency of urination: Secondary | ICD-10-CM | POA: Diagnosis not present

## 2013-06-11 DIAGNOSIS — N400 Enlarged prostate without lower urinary tract symptoms: Secondary | ICD-10-CM | POA: Diagnosis not present

## 2013-06-11 DIAGNOSIS — E291 Testicular hypofunction: Secondary | ICD-10-CM | POA: Diagnosis not present

## 2013-06-11 DIAGNOSIS — Z125 Encounter for screening for malignant neoplasm of prostate: Secondary | ICD-10-CM | POA: Diagnosis not present

## 2013-06-11 DIAGNOSIS — N529 Male erectile dysfunction, unspecified: Secondary | ICD-10-CM | POA: Diagnosis not present

## 2013-06-26 DIAGNOSIS — E782 Mixed hyperlipidemia: Secondary | ICD-10-CM | POA: Diagnosis not present

## 2013-06-26 DIAGNOSIS — I2510 Atherosclerotic heart disease of native coronary artery without angina pectoris: Secondary | ICD-10-CM | POA: Diagnosis not present

## 2013-06-26 DIAGNOSIS — Z9861 Coronary angioplasty status: Secondary | ICD-10-CM | POA: Diagnosis not present

## 2013-06-26 DIAGNOSIS — E119 Type 2 diabetes mellitus without complications: Secondary | ICD-10-CM | POA: Diagnosis not present

## 2013-07-09 DIAGNOSIS — E119 Type 2 diabetes mellitus without complications: Secondary | ICD-10-CM | POA: Diagnosis not present

## 2013-07-09 DIAGNOSIS — F329 Major depressive disorder, single episode, unspecified: Secondary | ICD-10-CM | POA: Diagnosis not present

## 2013-07-09 DIAGNOSIS — I10 Essential (primary) hypertension: Secondary | ICD-10-CM | POA: Diagnosis not present

## 2013-07-09 DIAGNOSIS — M109 Gout, unspecified: Secondary | ICD-10-CM | POA: Diagnosis not present

## 2013-07-09 DIAGNOSIS — E785 Hyperlipidemia, unspecified: Secondary | ICD-10-CM | POA: Diagnosis not present

## 2013-07-09 DIAGNOSIS — T7840XA Allergy, unspecified, initial encounter: Secondary | ICD-10-CM | POA: Diagnosis not present

## 2013-07-09 DIAGNOSIS — M545 Low back pain: Secondary | ICD-10-CM | POA: Diagnosis not present

## 2013-07-09 DIAGNOSIS — F411 Generalized anxiety disorder: Secondary | ICD-10-CM | POA: Diagnosis not present

## 2013-07-12 ENCOUNTER — Encounter: Payer: Self-pay | Admitting: Neurology

## 2013-07-12 ENCOUNTER — Ambulatory Visit (INDEPENDENT_AMBULATORY_CARE_PROVIDER_SITE_OTHER): Payer: Medicare Other | Admitting: Neurology

## 2013-07-12 VITALS — BP 142/88 | HR 76 | Ht 69.0 in | Wt 217.0 lb

## 2013-07-12 DIAGNOSIS — I4892 Unspecified atrial flutter: Secondary | ICD-10-CM

## 2013-07-12 DIAGNOSIS — M542 Cervicalgia: Secondary | ICD-10-CM | POA: Diagnosis not present

## 2013-07-12 DIAGNOSIS — I2510 Atherosclerotic heart disease of native coronary artery without angina pectoris: Secondary | ICD-10-CM | POA: Diagnosis not present

## 2013-07-12 DIAGNOSIS — H9190 Unspecified hearing loss, unspecified ear: Secondary | ICD-10-CM | POA: Diagnosis not present

## 2013-07-12 DIAGNOSIS — F1011 Alcohol abuse, in remission: Secondary | ICD-10-CM

## 2013-07-12 DIAGNOSIS — G4730 Sleep apnea, unspecified: Secondary | ICD-10-CM

## 2013-07-12 DIAGNOSIS — R209 Unspecified disturbances of skin sensation: Secondary | ICD-10-CM | POA: Diagnosis not present

## 2013-07-12 DIAGNOSIS — H9193 Unspecified hearing loss, bilateral: Secondary | ICD-10-CM

## 2013-07-12 DIAGNOSIS — R202 Paresthesia of skin: Secondary | ICD-10-CM | POA: Insufficient documentation

## 2013-07-12 NOTE — Progress Notes (Signed)
[facility]  PATIENT: Austin Sawyer DOB: 1935-08-01  HISTORICAL Austin Sawyer is a 78 year old right-handed African American male, referred by his primary care physician Dr. Julio Sicks for evaluation of neck pain, left arm paresthesia  He had past medical history of  hypertension, chronic low back pain, anxiety, type 2 diabetes, hyperlipidemia, gout, chronic renal insufficiency, neuropathy, coronary artery disease, vitamin D deficiency he is hard of hearing, is a poor historian, is one hour late for his appointment  Since 2012, he reported left-sided neck pain, radiating pain to his left shoulder, left arm, and hands paresthesia, involving all 5 fingers, he also complained of right shoulder pain, had right shoulder arthroscopic surgery for his right rotator cuff.  He also complains of intermittent tremor involving bilateral upper, and lower extremities, he denies weakness, he denied significant gait difficulty, complains of low back pain, right leg muscle cramping sometimes,.   REVIEW OF SYSTEMS: Full 14 system review of systems performed and notable only for hearing loss, flushing, urination problems, numbness, tremor, restless legs, disinterested in activities,  ALLERGIES: No Known Allergies  HOME MEDICATIONS: Outpatient Prescriptions Prior to Visit  Medication Sig Dispense Refill  . B Complex-C (B-COMPLEX WITH VITAMIN C) tablet Take 1 tablet by mouth daily.  30 tablet  0  . carvedilol (COREG) 12.5 MG tablet Take 1 tablet (12.5 mg total) by mouth 2 (two) times daily with a meal.  60 tablet  0  . colchicine 0.6 MG tablet Take 1 tablet (0.6 mg total) by mouth daily.  30 tablet  0  . darifenacin (ENABLEX) 7.5 MG 24 hr tablet Take 1 tablet (7.5 mg total) by mouth daily.  30 tablet  0  . DULoxetine (CYMBALTA) 20 MG capsule Take 2 capsules (40 mg total) by mouth daily.  60 capsule  0  . ezetimibe-simvastatin (VYTORIN) 10-80 MG per tablet Take 1 tablet by mouth at bedtime.   30 tablet  0  . hydrOXYzine (VISTARIL) 50 MG capsule Take 1 capsule (50 mg total) by mouth at bedtime.  30 capsule  0  . metFORMIN (GLUCOPHAGE-XR) 500 MG 24 hr tablet Take 1 tablet (500 mg total) by mouth daily with breakfast.  30 tablet  0  . mirtazapine (REMERON) 15 MG tablet Take 1 tablet (15 mg total) by mouth at bedtime as needed (insomia).  30 tablet  0  . nitroGLYCERIN (NITROSTAT) 0.4 MG SL tablet Place 0.4 mg under the tongue every 5 (five) minutes as needed. Chest pain      . solifenacin (VESICARE) 5 MG tablet Take 2 tablets (10 mg total) by mouth daily.  60 tablet  0  . tamsulosin (FLOMAX) 0.4 MG CAPS Take 1 capsule (0.4 mg total) by mouth daily.  30 capsule  0  . telmisartan-hydrochlorothiazide (MICARDIS HCT) 80-25 MG per tablet Take 1 tablet by mouth daily.  30 tablet  0   No facility-administered medications prior to visit.    PAST MEDICAL HISTORY: Past Medical History  Diagnosis Date  . Hypertension   . Coronary artery disease   . Diabetes mellitus without complication   . MI, old   . Reflux   . Depression     PAST SURGICAL HISTORY: Past Surgical History  Procedure Laterality Date  . Hernia repair    . Rotator cuff repair      FAMILY HISTORY: History reviewed. No pertinent family history.  SOCIAL HISTORY:   PHYSICAL EXAM   Filed Vitals:   07/12/13 1132  BP: 142/88  Pulse: 76  Height: 5\' 9"  (1.753 m)  Weight: 217 lb (98.431 kg)    Not recorded    Body mass index is 32.03 kg/(m^2).   Generalized: In no acute distress  Neck: Supple, no carotid bruits   Cardiac: Regular rate rhythm  Pulmonary: Clear to auscultation bilaterally  Musculoskeletal: No deformity  Neurological examination  Mentation: Alert oriented to time, place, history taking, and causual conversation  Cranial nerve II-XII: Pupils were equal round reactive to light extraocular movements were full, visual field were full on confrontational test. facial sensation and strength were  normal. Hard of hearing, Uvula tongue midline.  head turning and shoulder shrug and were normal and symmetric.Tongue protrusion into cheek strength was normal.  Motor: normal tone, bulk and strength, with exception of mild left arm weakness due likely due to left shoulder pain.  Sensory: Intact to fine touch, pinprick, preserved vibratory sensation, and proprioception at toes.  Coordination: Normal finger to nose, heel-to-shin bilaterally there was no truncal ataxia  Gait: cautious gait, able to to tiptoe, heel and tandem walking.  Romberg signs: Negative  Deep tendon reflexes: Brachioradialis 1/1, biceps 1/1, triceps 1/1, patellar 2/2, Achilles trace, plantar responses were flexor bilaterally.   DIAGNOSTIC DATA (LABS, IMAGING, TESTING) - I reviewed patient records, labs, notes, testing and imaging myself where available.  Lab Results  Component Value Date   WBC 9.8 04/22/2013   HGB 14.0 04/22/2013   HCT 41.5 04/22/2013   MCV 86.5 04/22/2013   PLT 176 04/22/2013      Component Value Date/Time   NA 135 04/22/2013 1039   K 4.1 04/22/2013 1039   CL 98 04/22/2013 1039   CO2 27 04/22/2013 1039   GLUCOSE 114* 04/22/2013 1039   BUN 23 04/22/2013 1039   CREATININE 1.26 04/22/2013 1039   CALCIUM 9.1 04/22/2013 1039   PROT 7.5 04/22/2013 1039   ALBUMIN 3.9 04/22/2013 1039   AST 29 04/22/2013 1039   ALT 33 04/22/2013 1039   ALKPHOS 54 04/22/2013 1039   BILITOT 0.5 04/22/2013 1039   GFRNONAA 54* 04/22/2013 1039   GFRAA 62* 04/22/2013 1039   Lab Results  Component Value Date   CHOL  Value: 129        ATP III CLASSIFICATION:  <200     mg/dL   Desirable  161-096  mg/dL   Borderline High  >=045    mg/dL   High        40/06/8118   HDL 36* 08/09/2009   LDLCALC  Value: 78        Total Cholesterol/HDL:CHD Risk Coronary Heart Disease Risk Table                     Men   Women  1/2 Average Risk   3.4   3.3  Average Risk       5.0   4.4  2 X Average Risk   9.6   7.1  3 X Average Risk  23.4   11.0        Use the  calculated Patient Ratio above and the CHD Risk Table to determine the patient's CHD Risk.        ATP III CLASSIFICATION (LDL):  <100     mg/dL   Optimal  147-829  mg/dL   Near or Above                    Optimal  130-159  mg/dL   Borderline  562-130  mg/dL  High  >190     mg/dL   Very High 78/01/6961   TRIG 73 08/09/2009   CHOLHDL 3.6 08/09/2009   Lab Results  Component Value Date   HGBA1C  Value: 5.8 (NOTE) The ADA recommends the following therapeutic goal for glycemic control related to Hgb A1c measurement: Goal of therapy: <6.5 Hgb A1c  Reference: American Diabetes Association: Clinical Practice Recommendations 2010, Diabetes Care, 2010, 33: (Suppl  1). 08/11/2009   Lab Results  Component Value Date   VITAMINB12 1624* 05/12/2007   Lab Results  Component Value Date   TSH 1.027 04/27/2013      ASSESSMENT AND PLAN   78 years old right-handed African American male, with past medical history of hypertension, hyperlipidemia, diabetes, coronary artery disease, presenting with right-sided neck pain, radiating pain to her left upper extremity, he is very hard of hearing, poor historian,  1 need to rule out cervical radiculopathy MRI of cervical spine,   2 EMG nerve conduction study      Levert Feinstein, M.D. Ph.D.  [facility] [address] [place], [place] [address] [phone]

## 2013-07-16 DIAGNOSIS — E1159 Type 2 diabetes mellitus with other circulatory complications: Secondary | ICD-10-CM | POA: Diagnosis not present

## 2013-07-16 DIAGNOSIS — E119 Type 2 diabetes mellitus without complications: Secondary | ICD-10-CM | POA: Diagnosis not present

## 2013-07-16 DIAGNOSIS — I2510 Atherosclerotic heart disease of native coronary artery without angina pectoris: Secondary | ICD-10-CM | POA: Diagnosis not present

## 2013-07-16 DIAGNOSIS — E785 Hyperlipidemia, unspecified: Secondary | ICD-10-CM | POA: Diagnosis not present

## 2013-07-16 DIAGNOSIS — I10 Essential (primary) hypertension: Secondary | ICD-10-CM | POA: Diagnosis not present

## 2013-07-16 DIAGNOSIS — D7289 Other specified disorders of white blood cells: Secondary | ICD-10-CM | POA: Diagnosis not present

## 2013-07-16 DIAGNOSIS — I119 Hypertensive heart disease without heart failure: Secondary | ICD-10-CM | POA: Diagnosis not present

## 2013-07-16 DIAGNOSIS — F329 Major depressive disorder, single episode, unspecified: Secondary | ICD-10-CM | POA: Diagnosis not present

## 2013-07-19 DIAGNOSIS — I2510 Atherosclerotic heart disease of native coronary artery without angina pectoris: Secondary | ICD-10-CM | POA: Diagnosis not present

## 2013-07-19 DIAGNOSIS — I10 Essential (primary) hypertension: Secondary | ICD-10-CM | POA: Diagnosis not present

## 2013-07-20 ENCOUNTER — Ambulatory Visit (INDEPENDENT_AMBULATORY_CARE_PROVIDER_SITE_OTHER): Payer: Self-pay

## 2013-07-20 ENCOUNTER — Ambulatory Visit (INDEPENDENT_AMBULATORY_CARE_PROVIDER_SITE_OTHER): Payer: Medicare Other | Admitting: Neurology

## 2013-07-20 DIAGNOSIS — M542 Cervicalgia: Secondary | ICD-10-CM

## 2013-07-20 DIAGNOSIS — R209 Unspecified disturbances of skin sensation: Secondary | ICD-10-CM

## 2013-07-20 DIAGNOSIS — Z0289 Encounter for other administrative examinations: Secondary | ICD-10-CM

## 2013-07-20 DIAGNOSIS — I10 Essential (primary) hypertension: Secondary | ICD-10-CM | POA: Diagnosis not present

## 2013-07-20 DIAGNOSIS — I2510 Atherosclerotic heart disease of native coronary artery without angina pectoris: Secondary | ICD-10-CM | POA: Diagnosis not present

## 2013-07-20 DIAGNOSIS — E785 Hyperlipidemia, unspecified: Secondary | ICD-10-CM

## 2013-07-20 DIAGNOSIS — I509 Heart failure, unspecified: Secondary | ICD-10-CM

## 2013-07-20 DIAGNOSIS — R202 Paresthesia of skin: Secondary | ICD-10-CM

## 2013-07-20 NOTE — Procedures (Signed)
    [facility]  NCS (NERVE CONDUCTION STUDY) WITH EMG (ELECTROMYOGRAPHY) REPORT   STUDY DATE: 07/20/2013 PATIENT NAME: Austin Sawyer DOB: 01-10-1935 MRN: [id number]    TECHNOLOGIST: Gearldine Shown ELECTROMYOGRAPHER: Levert Feinstein M.D.  CLINICAL INFORMATION:   78 year old [place] American male, with history of neck pain, radiating pain to his left shoulder, left arm,  On examination, he has mild left shoulder abduction, external rotation, left elbow flexion weakness. He has decreased left brachioradialis, and the left biceps reflexes.  FINDINGS: NERVE CONDUCTION STUDY: Bilateral median, ulnar sensory and motor responses were normal.  NEEDLE ELECTROMYOGRAPHY: Selected needle examination was performed at left upper extremity muscles, and the left cervical paraspinal muscles.  Needle examination of left extensor digitorum communis, pronator teres, biceps, triceps, deltoid, abductor pollicis brevis was normal.  There is no spontaneous activity at left cervical paraspinal muscles, left C5, C6, C7  IMPRESSION:   This is a normal study. There is no electrodiagnostic evidence of left upper extremity neuropathy, or left cervical radiculopathy   INTERPRETING PHYSICIAN:   Levert Feinstein M.D. Ph.D. [facility] [address] [place], [place] [address] [phone]

## 2013-07-26 ENCOUNTER — Ambulatory Visit
Admission: RE | Admit: 2013-07-26 | Discharge: 2013-07-26 | Disposition: A | Discharge status: Home / Self Care | Payer: Medicare Other | Source: Ambulatory Visit | Attending: Neurology | Admitting: Neurology

## 2013-07-26 DIAGNOSIS — R202 Paresthesia of skin: Secondary | ICD-10-CM

## 2013-07-26 DIAGNOSIS — R209 Unspecified disturbances of skin sensation: Secondary | ICD-10-CM

## 2013-07-26 DIAGNOSIS — M19019 Primary osteoarthritis, unspecified shoulder: Secondary | ICD-10-CM | POA: Diagnosis not present

## 2013-07-26 DIAGNOSIS — M542 Cervicalgia: Secondary | ICD-10-CM

## 2013-07-26 NOTE — Progress Notes (Signed)
Quick Note:  Please call patient, x-ray showed mild glenohumeral and AC joint osteoarthritis. No acute osseous abnormality.  ______

## 2013-07-30 ENCOUNTER — Telehealth: Payer: Self-pay | Admitting: Neurology

## 2013-07-30 NOTE — Telephone Encounter (Signed)
I have called him,    MRI cervical spine (without) demonstrating:   C4-5: disc bulging and uncovertebral joint hypertrophy with moderate spinal stenosis and severe biforaminal foraminal stenosis   C3-4, C6-7: disc bulging and uncovertebral joint hypertrophy with mild spinal stenosis and severe biforaminal foraminal stenosis   C5-6: disc bulging and uncovertebral joint hypertrophy with severe biforaminal foraminal stenosis    He complains of low back pain, tinglings in his arms are bothersome, he denies gait difficulty, he complains of nocturnal urination.  Keep up follow up appt in 12/12 2014 at 11:30am.

## 2013-08-06 NOTE — Progress Notes (Signed)
Quick Note:  I called pt and relayed the results. He verbalized understanding. ______

## 2013-08-13 DIAGNOSIS — M545 Low back pain: Secondary | ICD-10-CM | POA: Diagnosis not present

## 2013-08-13 DIAGNOSIS — F329 Major depressive disorder, single episode, unspecified: Secondary | ICD-10-CM | POA: Diagnosis not present

## 2013-08-13 DIAGNOSIS — M109 Gout, unspecified: Secondary | ICD-10-CM | POA: Diagnosis not present

## 2013-08-13 DIAGNOSIS — E119 Type 2 diabetes mellitus without complications: Secondary | ICD-10-CM | POA: Diagnosis not present

## 2013-08-13 DIAGNOSIS — E785 Hyperlipidemia, unspecified: Secondary | ICD-10-CM | POA: Diagnosis not present

## 2013-08-13 DIAGNOSIS — T7840XA Allergy, unspecified, initial encounter: Secondary | ICD-10-CM | POA: Diagnosis not present

## 2013-08-13 DIAGNOSIS — F411 Generalized anxiety disorder: Secondary | ICD-10-CM | POA: Diagnosis not present

## 2013-08-13 DIAGNOSIS — I10 Essential (primary) hypertension: Secondary | ICD-10-CM | POA: Diagnosis not present

## 2013-08-20 DIAGNOSIS — F0390 Unspecified dementia without behavioral disturbance: Secondary | ICD-10-CM | POA: Diagnosis not present

## 2013-09-14 ENCOUNTER — Encounter: Payer: Self-pay | Admitting: Neurology

## 2013-09-14 ENCOUNTER — Ambulatory Visit (INDEPENDENT_AMBULATORY_CARE_PROVIDER_SITE_OTHER): Payer: Medicare Other | Admitting: Neurology

## 2013-09-14 VITALS — BP 174/93 | HR 78 | Ht 69.0 in | Wt 220.0 lb

## 2013-09-14 DIAGNOSIS — M5412 Radiculopathy, cervical region: Secondary | ICD-10-CM

## 2013-09-14 DIAGNOSIS — M5010 Cervical disc disorder with radiculopathy, unspecified cervical region: Secondary | ICD-10-CM | POA: Insufficient documentation

## 2013-09-14 MED ORDER — DULOXETINE HCL 60 MG PO CPEP: 60.0000 mg | ORAL_CAPSULE | Freq: Every day | ORAL | Status: DC

## 2013-09-14 NOTE — Progress Notes (Signed)
[facility]  Austin Sawyer: Austin Sawyer DOB: [birth year]  HISTORICAL Austin Sawyer is a 78 year old right-handed African American male, referred by Austin Sawyer primary care physician Dr. Julio Sicks for evaluation of neck pain, left arm paresthesia  Austin Sawyer had past medical history of  hypertension, chronic low back pain, anxiety, type 2 diabetes, hyperlipidemia, gout, chronic renal insufficiency, neuropathy, coronary artery disease, vitamin D deficiency Austin Sawyer is hard of hearing, is a poor historian, is one hour late for Austin Sawyer appointment  Since 2012, Austin Sawyer reported left-sided neck pain, radiating pain to Austin Sawyer left shoulder, left arm, and hands paresthesia, involving all 5 fingers, Austin Sawyer had history of right shoulder arthroscopic surgery for Austin Sawyer right rotator cuff in 1998.    Austin Sawyer also complains of intermittent tremor involving bilateral upper, and lower extremities, Austin Sawyer denies weakness, Austin Sawyer denied significant gait difficulty, complains of low back pain, right leg muscle cramping sometimes,.  UPDATE 09/14/2013:  Since last visit, Austin Sawyer had MRI of cervical spine,  we have reviewed the film together, there is multilevel degenerative disc disease, C4-5: disc bulging and uncovertebral joint hypertrophy with moderate spinal stenosis and severe biforaminal foraminal stenosis  C3-4, C6-7: disc bulging and uncovertebral joint hypertrophy with mild spinal stenosis and severe biforaminal foraminal stenosis  C5-6: disc bulging and uncovertebral joint hypertrophy with severe biforaminal foraminal stenosis, left worse than right  Austin Sawyer continues to complains of left neck pain, radiating pain to Austin Sawyer left shoulder, left arm, involving left dorsum hand 2-4th fingers.  X-ray of left shoulder showed mild glenohumeral and AC joint osteoarthritis. No acute osseous abnormality.  EMG nerve conduction study showed no significant abnormalities.  Austin Sawyer denies gait difficulty, does complains of low back pain, radiating pain to Austin Sawyer left hip,  and Austin Sawyer left leg, Austin Sawyer denies persistent left lower chunky weakness, sensory loss, gait difficulty, no incontinence, Austin Sawyer also complains of intermittent bilaterally hands tremor left worse than right,   REVIEW OF SYSTEMS: Full 14 system review of systems performed and notable only for hearing loss, flushing, numbness, tremor, restless legs, disinterested in activities,  ALLERGIES: No Known Allergies  HOME MEDICATIONS: Outpatient Prescriptions Prior to Visit  Medication Sig Dispense Refill  . B Complex-C (B-COMPLEX WITH VITAMIN C) tablet Take 1 tablet by mouth daily.  30 tablet  0  . carvedilol (COREG) 12.5 MG tablet Take 1 tablet (12.5 mg total) by mouth 2 (two) times daily with a meal.  60 tablet  0  . colchicine 0.6 MG tablet Take 1 tablet (0.6 mg total) by mouth daily.  30 tablet  0  . darifenacin (ENABLEX) 7.5 MG 24 hr tablet Take 1 tablet (7.5 mg total) by mouth daily.  30 tablet  0  . DULoxetine (CYMBALTA) 20 MG capsule Take 2 capsules (40 mg total) by mouth daily.  60 capsule  0  . ezetimibe-simvastatin (VYTORIN) 10-80 MG per tablet Take 1 tablet by mouth at bedtime.  30 tablet  0  . hydrOXYzine (VISTARIL) 50 MG capsule Take 1 capsule (50 mg total) by mouth at bedtime.  30 capsule  0  . metFORMIN (GLUCOPHAGE-XR) 500 MG 24 hr tablet Take 1 tablet (500 mg total) by mouth daily with breakfast.  30 tablet  0  . mirtazapine (REMERON) 15 MG tablet Take 1 tablet (15 mg total) by mouth at bedtime as needed (insomia).  30 tablet  0  . nitroGLYCERIN (NITROSTAT) 0.4 MG SL tablet Place 0.4 mg under the tongue every 5 (five) minutes as needed. Chest pain      .  solifenacin (VESICARE) 5 MG tablet Take 2 tablets (10 mg total) by mouth daily.  60 tablet  0  . tamsulosin (FLOMAX) 0.4 MG CAPS Take 1 capsule (0.4 mg total) by mouth daily.  30 capsule  0  . telmisartan-hydrochlorothiazide (MICARDIS HCT) 80-25 MG per tablet Take 1 tablet by mouth daily.  30 tablet  0   No facility-administered medications  prior to visit.    PAST MEDICAL HISTORY: Past Medical History  Diagnosis Date  . Hypertension   . Coronary artery disease   . Diabetes mellitus without complication   . MI, old   . Reflux   . Depression   . Cervicalgia   . Disturbance of skin sensation   . Coronary atherosclerosis of unspecified type of vessel, native or graft   . Unspecified hearing loss   . Unspecified sleep apnea   . Alcohol abuse, in remission   . Atrial flutter     PAST SURGICAL HISTORY: Past Surgical History  Procedure Laterality Date  . Hernia repair    . Rotator cuff repair      FAMILY HISTORY: Family History  Problem Relation Age of Onset  . Anxiety disorder Mother   . Cancer Mother     SOCIAL HISTORY:   PHYSICAL EXAM   Filed Vitals:   09/14/13 1128  BP: 174/93  Pulse: 78  Height: 5\' 9"  (1.753 m)  Weight: 220 lb (99.791 kg)    Not recorded    Body mass index is 32.47 kg/(m^2).   Generalized: In no acute distress  Neck: Supple, no carotid bruits   Cardiac: Regular rate rhythm  Pulmonary: Clear to auscultation bilaterally  Musculoskeletal: No deformity  Neurological examination  Mentation: Alert oriented to time, place, history taking, and causual conversation Mini-Mental Status Examination is 30 out of 30, hard of hearing, ,    Cranial nerve II-XII: Pupils were equal round reactive to light extraocular movements were full, visual field were full on confrontational test. facial sensation and strength were normal. Hard of hearing, Uvula tongue midline.  head turning and shoulder shrug and were normal and symmetric.Tongue protrusion into cheek strength was normal.  Motor: Austin Sawyer has mild left shoulder abduction, external rotation, left elbow flexion, pronation weakness,.  Sensory: Intact to fine touch, pinprick, preserved vibratory sensation, and proprioception at toes.  Coordination: Normal finger to nose, heel-to-shin bilaterally there was no truncal ataxia  Gait: cautious  gait, able to to tiptoe, heel and tandem walking.  Romberg signs: Negative  Deep tendon reflexes: Brachioradialis 1/1, biceps 1/1, triceps 1/1, patellar 2/2, Achilles trace, plantar responses were flexor bilaterally.   DIAGNOSTIC DATA (LABS, IMAGING, TESTING) - I reviewed Austin Sawyer records, labs, notes, testing and imaging myself where available.  Lab Results  Component Value Date   WBC 9.8 04/22/2013   HGB 14.0 04/22/2013   HCT 41.5 04/22/2013   MCV 86.5 04/22/2013   PLT 176 04/22/2013      Component Value Date/Time   NA 135 04/22/2013 1039   K 4.1 04/22/2013 1039   CL 98 04/22/2013 1039   CO2 27 04/22/2013 1039   GLUCOSE 114* 04/22/2013 1039   BUN 23 04/22/2013 1039   CREATININE 1.26 04/22/2013 1039   CALCIUM 9.1 04/22/2013 1039   PROT 7.5 04/22/2013 1039   ALBUMIN 3.9 04/22/2013 1039   AST 29 04/22/2013 1039   ALT 33 04/22/2013 1039   ALKPHOS 54 04/22/2013 1039   BILITOT 0.5 04/22/2013 1039   GFRNONAA 54* 04/22/2013 1039   GFRAA 62* 04/22/2013  1039   Lab Results  Component Value Date   CHOL  Value: 129        ATP III CLASSIFICATION:  <200     mg/dL   Desirable  161-096  mg/dL   Borderline High  >=045    mg/dL   High        40/06/8118   HDL 36* 08/09/2009   LDLCALC  Value: 78        Total Cholesterol/HDL:CHD Risk Coronary Heart Disease Risk Table                     Men   Women  1/2 Average Risk   3.4   3.3  Average Risk       5.0   4.4  2 X Average Risk   9.6   7.1  3 X Average Risk  23.4   11.0        Use the calculated Austin Sawyer Ratio above and the CHD Risk Table to determine the Austin Sawyer's CHD Risk.        ATP III CLASSIFICATION (LDL):  <100     mg/dL   Optimal  147-829  mg/dL   Near or Above                    Optimal  130-159  mg/dL   Borderline  562-130  mg/dL   High  >865     mg/dL   Very High 78/01/6961   TRIG 73 08/09/2009   CHOLHDL 3.6 08/09/2009   Lab Results  Component Value Date   HGBA1C  Value: 5.8 (NOTE) The ADA recommends the following therapeutic goal for glycemic control related  to Hgb A1c measurement: Goal of therapy: <6.5 Hgb A1c  Reference: American Diabetes Association: Clinical Practice Recommendations 2010, Diabetes Care, 2010, 33: (Suppl  1). 08/11/2009   Lab Results  Component Value Date   VITAMINB12 1624* 05/12/2007   Lab Results  Component Value Date   TSH 1.027 04/27/2013      ASSESSMENT AND PLAN   78 years old right-handed African American male, with past medical history of hypertension, hyperlipidemia, diabetes, coronary artery disease, presenting with left sided neck pain, radiating pain to is  left upper extremity, Austin Sawyer is very hard of hearing, MRI of cervical spine has demonstrated multilevel degenerative disc disease, detailed C4-5: disc bulging and uncovertebral joint hypertrophy with moderate spinal stenosis and severe biforaminal foraminal stenosis  C3-4, C6-7: disc bulging and uncovertebral joint hypertrophy with mild spinal stenosis and severe biforaminal foraminal stenosis C5-6: disc bulging and uncovertebral joint hypertrophy with severe biforaminal foraminal stenosis, left worse than right.   there is left worse than right cervical foraminal stenosis at multilevel, most severe at C5-6 level.  1.Austin Sawyer history and imaging findings are most consistent with a left C5-6 radiculopathy,  2 Austin Sawyer refused to have surgery,  3 refer him to physical therapy, pain management by Dr. Juanetta Gosling, 4 increase Cymbalta to 60 mg every day, continue gabapentin,  5. return to clinic in 6 months with Gerlene Fee, M.D. Ph.D.  [facility] [address], [address] [place], [place] [address] [phone]

## 2013-10-05 DIAGNOSIS — G4730 Sleep apnea, unspecified: Secondary | ICD-10-CM | POA: Diagnosis not present

## 2013-10-05 DIAGNOSIS — I10 Essential (primary) hypertension: Secondary | ICD-10-CM | POA: Diagnosis not present

## 2013-10-05 DIAGNOSIS — Z9861 Coronary angioplasty status: Secondary | ICD-10-CM | POA: Diagnosis not present

## 2013-10-05 DIAGNOSIS — I2510 Atherosclerotic heart disease of native coronary artery without angina pectoris: Secondary | ICD-10-CM | POA: Diagnosis not present

## 2013-10-08 ENCOUNTER — Ambulatory Visit: Payer: Medicare Other | Attending: Neurology | Admitting: Physical Therapy

## 2013-10-08 DIAGNOSIS — M5412 Radiculopathy, cervical region: Secondary | ICD-10-CM | POA: Insufficient documentation

## 2013-10-08 DIAGNOSIS — M6281 Muscle weakness (generalized): Secondary | ICD-10-CM | POA: Insufficient documentation

## 2013-10-08 DIAGNOSIS — IMO0001 Reserved for inherently not codable concepts without codable children: Secondary | ICD-10-CM | POA: Insufficient documentation

## 2013-10-10 ENCOUNTER — Ambulatory Visit: Payer: Medicare Other | Admitting: Physical Therapy

## 2013-10-10 DIAGNOSIS — M5412 Radiculopathy, cervical region: Secondary | ICD-10-CM | POA: Diagnosis not present

## 2013-10-10 DIAGNOSIS — IMO0001 Reserved for inherently not codable concepts without codable children: Secondary | ICD-10-CM | POA: Diagnosis not present

## 2013-10-10 DIAGNOSIS — M6281 Muscle weakness (generalized): Secondary | ICD-10-CM | POA: Diagnosis not present

## 2013-10-16 DIAGNOSIS — M5412 Radiculopathy, cervical region: Secondary | ICD-10-CM | POA: Diagnosis not present

## 2013-10-16 DIAGNOSIS — M47812 Spondylosis without myelopathy or radiculopathy, cervical region: Secondary | ICD-10-CM | POA: Diagnosis not present

## 2013-10-16 DIAGNOSIS — M542 Cervicalgia: Secondary | ICD-10-CM | POA: Diagnosis not present

## 2013-10-17 ENCOUNTER — Ambulatory Visit: Payer: Medicare Other | Admitting: Physical Therapy

## 2013-12-10 DIAGNOSIS — Z125 Encounter for screening for malignant neoplasm of prostate: Secondary | ICD-10-CM | POA: Diagnosis not present

## 2013-12-10 DIAGNOSIS — E785 Hyperlipidemia, unspecified: Secondary | ICD-10-CM | POA: Diagnosis not present

## 2013-12-10 DIAGNOSIS — Z0110 Encounter for examination of ears and hearing without abnormal findings: Secondary | ICD-10-CM | POA: Diagnosis not present

## 2013-12-10 DIAGNOSIS — M109 Gout, unspecified: Secondary | ICD-10-CM | POA: Diagnosis not present

## 2013-12-10 DIAGNOSIS — E119 Type 2 diabetes mellitus without complications: Secondary | ICD-10-CM | POA: Diagnosis not present

## 2013-12-10 DIAGNOSIS — M545 Low back pain, unspecified: Secondary | ICD-10-CM | POA: Diagnosis not present

## 2013-12-10 DIAGNOSIS — E559 Vitamin D deficiency, unspecified: Secondary | ICD-10-CM | POA: Diagnosis not present

## 2013-12-10 DIAGNOSIS — I10 Essential (primary) hypertension: Secondary | ICD-10-CM | POA: Diagnosis not present

## 2013-12-10 DIAGNOSIS — Z0000 Encounter for general adult medical examination without abnormal findings: Secondary | ICD-10-CM | POA: Diagnosis not present

## 2013-12-10 DIAGNOSIS — T7840XA Allergy, unspecified, initial encounter: Secondary | ICD-10-CM | POA: Diagnosis not present

## 2013-12-10 DIAGNOSIS — N19 Unspecified kidney failure: Secondary | ICD-10-CM | POA: Diagnosis not present

## 2013-12-10 DIAGNOSIS — F329 Major depressive disorder, single episode, unspecified: Secondary | ICD-10-CM | POA: Diagnosis not present

## 2013-12-10 DIAGNOSIS — F3289 Other specified depressive episodes: Secondary | ICD-10-CM | POA: Diagnosis not present

## 2014-01-15 DIAGNOSIS — F0390 Unspecified dementia without behavioral disturbance: Secondary | ICD-10-CM | POA: Diagnosis not present

## 2014-02-18 DIAGNOSIS — F329 Major depressive disorder, single episode, unspecified: Secondary | ICD-10-CM | POA: Diagnosis not present

## 2014-02-18 DIAGNOSIS — F411 Generalized anxiety disorder: Secondary | ICD-10-CM | POA: Diagnosis not present

## 2014-02-18 DIAGNOSIS — F3289 Other specified depressive episodes: Secondary | ICD-10-CM | POA: Diagnosis not present

## 2014-02-18 DIAGNOSIS — T7840XA Allergy, unspecified, initial encounter: Secondary | ICD-10-CM | POA: Diagnosis not present

## 2014-02-18 DIAGNOSIS — M545 Low back pain, unspecified: Secondary | ICD-10-CM | POA: Diagnosis not present

## 2014-02-18 DIAGNOSIS — E119 Type 2 diabetes mellitus without complications: Secondary | ICD-10-CM | POA: Diagnosis not present

## 2014-02-18 DIAGNOSIS — E785 Hyperlipidemia, unspecified: Secondary | ICD-10-CM | POA: Diagnosis not present

## 2014-02-18 DIAGNOSIS — M109 Gout, unspecified: Secondary | ICD-10-CM | POA: Diagnosis not present

## 2014-02-18 DIAGNOSIS — I10 Essential (primary) hypertension: Secondary | ICD-10-CM | POA: Diagnosis not present

## 2014-03-15 ENCOUNTER — Ambulatory Visit (INDEPENDENT_AMBULATORY_CARE_PROVIDER_SITE_OTHER): Payer: Medicare Other | Admitting: Nurse Practitioner

## 2014-03-15 ENCOUNTER — Encounter: Payer: Self-pay | Admitting: Nurse Practitioner

## 2014-03-15 ENCOUNTER — Ambulatory Visit: Payer: Medicare Other | Admitting: Nurse Practitioner

## 2014-03-15 ENCOUNTER — Encounter (INDEPENDENT_AMBULATORY_CARE_PROVIDER_SITE_OTHER): Payer: Self-pay

## 2014-03-15 VITALS — BP 135/84 | HR 97 | Ht 66.5 in | Wt 227.0 lb

## 2014-03-15 DIAGNOSIS — M5412 Radiculopathy, cervical region: Secondary | ICD-10-CM

## 2014-03-15 DIAGNOSIS — M5010 Cervical disc disorder with radiculopathy, unspecified cervical region: Secondary | ICD-10-CM

## 2014-03-15 DIAGNOSIS — M542 Cervicalgia: Secondary | ICD-10-CM | POA: Diagnosis not present

## 2014-03-15 DIAGNOSIS — R209 Unspecified disturbances of skin sensation: Secondary | ICD-10-CM | POA: Diagnosis not present

## 2014-03-15 DIAGNOSIS — R202 Paresthesia of skin: Secondary | ICD-10-CM

## 2014-03-15 NOTE — Progress Notes (Signed)
[facility]  PATIENT: Austin Sawyer DOB: 12/21/1934   REASON FOR VISIT: follow up for neck pain   HISTORY OF PRESENT ILLNESS: Mr. Beverely PaceBryant, 79 year old male returns for followup. He was last seen in the office by 1 with a ride and a day to make a copy of the is well controlled yesterday and reviewed MRI she she had always some he is a Dr. Terrace ArabiaYan 09/14/2013. He has multilevel degenerative disc disease in the cervical area He continues to complains of left mild neck pain, radiating pain to his left shoulder, left arm, involving left dorsum hand 2-4th fingers. X-ray of left shoulder showed mild glenohumeral and AC joint osteoarthritis. No acute osseous abnormality. He had some physical therapy but has not do the exercises. He has refused surgery. He is currently on Cymbalta 60 mg daily which is controlling his pain fairly well.  EMG nerve conduction study showed no significant abnormalities.  He denies gait difficulty, does complains of low back pain, radiating pain to his left hip, and his left leg, he denies persistent left lower chunky weakness, sensory loss, gait difficulty, no incontinence, he also complains of intermittent bilaterally hands tremor left worse than right,   HISTORY: evaluation of neck pain, left arm paresthesia  He had past medical history of hypertension, chronic low back pain, anxiety, type 2 diabetes, hyperlipidemia, gout, chronic renal insufficiency, neuropathy, coronary artery disease, vitamin D deficiency he is hard of hearing, is a poor historian, is one hour late for his appointment  Since 2012, he reported left-sided neck pain, radiating pain to his left shoulder, left arm, and hands paresthesia, involving all 5 fingers, he had history of right shoulder arthroscopic surgery for his right rotator cuff in 1998.  He also complains of intermittent tremor involving bilateral upper, and lower extremities, he denies weakness, he denied significant gait difficulty,  complains of low back pain, right leg muscle cramping sometimes,.  UPDATE 09/14/2013:  Since last visit, he had MRI of cervical spine, we have reviewed the film together, there is multilevel degenerative disc disease,  C4-5: disc bulging and uncovertebral joint hypertrophy with moderate spinal stenosis and severe biforaminal foraminal stenosis  C3-4, C6-7: disc bulging and uncovertebral joint hypertrophy with mild spinal stenosis and severe biforaminal foraminal stenosis  C5-6: disc bulging and uncovertebral joint hypertrophy with severe biforaminal foraminal stenosis, left worse than right  He continues to complains of left neck pain, radiating pain to his left shoulder, left arm, involving left dorsum hand 2-4th fingers. X-ray of left shoulder showed mild glenohumeral and AC joint osteoarthritis. No acute osseous abnormality.  EMG nerve conduction study showed no significant abnormalities.  He denies gait difficulty, does complains of low back pain, radiating pain to his left hip, and his left leg, he denies persistent left lower chunky weakness, sensory loss, gait difficulty, no incontinence, he also complains of intermittent bilaterally hands tremor left worse than right,    REVIEW OF SYSTEMS: Full 14 system review of systems performed and notable only for those listed, all others are neg:  Constitutional: N/A  Cardiovascular: Leg swelling Ear/Nose/Throat: Hearing loss Skin: N/A  Eyes: N/A  Respiratory: N/A  Gastroitestinal: N/A  Hematology/Lymphatic: N/A  Endocrine: N/A Musculoskeletal:N/A  Allergy/Immunology: N/A  Neurological: Tremors Psychiatric: Depression Sleep : NA   ALLERGIES: No Known Allergies  HOME MEDICATIONS: Outpatient Prescriptions Prior to Visit  Medication Sig Dispense Refill  . colchicine 0.6 MG tablet Take 1 tablet (0.6 mg total) by mouth daily.  30 tablet  0  .  DULoxetine (CYMBALTA) 60 MG capsule Take 1 capsule (60 mg total) by mouth daily.  60 capsule  12    . hydrOXYzine (VISTARIL) 50 MG capsule Take 1 capsule (50 mg total) by mouth at bedtime.  30 capsule  0  . metFORMIN (GLUCOPHAGE-XR) 500 MG 24 hr tablet Take 1 tablet (500 mg total) by mouth daily with breakfast.  30 tablet  0  . mirtazapine (REMERON) 15 MG tablet Take 1 tablet (15 mg total) by mouth at bedtime as needed (insomia).  30 tablet  0  . nitroGLYCERIN (NITROSTAT) 0.4 MG SL tablet Place 0.4 mg under the tongue every 5 (five) minutes as needed. Chest pain      . solifenacin (VESICARE) 5 MG tablet Take 2 tablets (10 mg total) by mouth daily.  60 tablet  0  . tamsulosin (FLOMAX) 0.4 MG CAPS Take 1 capsule (0.4 mg total) by mouth daily.  30 capsule  0  . telmisartan-hydrochlorothiazide (MICARDIS HCT) 80-25 MG per tablet Take 1 tablet by mouth daily.  30 tablet  0  . darifenacin (ENABLEX) 7.5 MG 24 hr tablet Take 1 tablet (7.5 mg total) by mouth daily.  30 tablet  0  . ezetimibe-simvastatin (VYTORIN) 10-80 MG per tablet Take 1 tablet by mouth at bedtime.  30 tablet  0  . B Complex-C (B-COMPLEX WITH VITAMIN C) tablet Take 1 tablet by mouth daily.  30 tablet  0  . carvedilol (COREG) 12.5 MG tablet Take 1 tablet (12.5 mg total) by mouth 2 (two) times daily with a meal.  60 tablet  0   No facility-administered medications prior to visit.    PAST MEDICAL HISTORY: Past Medical History  Diagnosis Date  . Hypertension   . Coronary artery disease   . Diabetes mellitus without complication   . MI, old   . Reflux   . Depression   . Cervicalgia   . Disturbance of skin sensation   . Coronary atherosclerosis of unspecified type of vessel, native or graft   . Unspecified hearing loss   . Unspecified sleep apnea   . Alcohol abuse, in remission   . Atrial flutter     PAST SURGICAL HISTORY: Past Surgical History  Procedure Laterality Date  . Hernia repair    . Rotator cuff repair      FAMILY HISTORY: Family History  Problem Relation Age of Onset  . Anxiety disorder Mother   . Cancer  Mother     SOCIAL HISTORY: History   Social History  . Marital Status: Married    Spouse Name: Wilnette Kales    Number of Children: 4  . Years of Education: college   Occupational History  .      retired   Social History Main Topics  . Smoking status: Former Games developer  . Smokeless tobacco: Never Used  . Alcohol Use: No  . Drug Use: No  . Sexual Activity: No   Other Topics Concern  . Not on file   Social History Narrative   Patient lives at home with his wife Wilnette Kales).   Retired   Programme researcher, broadcasting/film/video- college   Right handed.   Caffeine- coffee two cups.     PHYSICAL EXAM  Filed Vitals:   03/15/14 1046  BP: 135/84  Pulse: 97  Height: 5' 6.5" (1.689 m)  Weight: 227 lb (102.967 kg)   Body mass index is 36.09 kg/(m^2).  Generalized: Well developed, in no acute distress  Head: normocephalic and atraumatic,. Oropharynx benign  Neck: Supple, no carotid  bruits  Cardiac: Regular rate rhythm, no murmur  Musculoskeletal: No deformity   Neurological examination   Mentation: Alert oriented to time, place, history taking. Follows all commands speech and language fluent  Cranial nerve II-XII: Pupils were equal round reactive to light extraocular movements were full, visual field were full on confrontational test. Facial sensation and strength were normal. Hard of hearing. Uvula tongue midline. head turning and shoulder shrug were normal and symmetric.Tongue protrusion into cheek strength was normal. Motor:  Mild left shoulder abduction external rotation weakness otherwise no focal weakness Sensory: normal and symmetric to light touch, pinprick, and  vibration  Coordination: finger-nose-finger, heel-to-shin bilaterally, no dysmetria Reflexes: 1+ upper and symmetric,  patellar 2/2, Achilles trace, plantar responses were flexor bilaterally. Gait and Station: Rising up from seated position without assistance, normal stance,  moderate stride, good arm swing, smooth turning, able to perform  tiptoe, and heel walking without difficulty. Tandem gait is steady  DIAGNOSTIC DATA (LABS, IMAGING, TESTING)    Lab Results  Component Value Date   TSH 1.027 04/27/2013      ASSESSMENT AND PLAN  79 y.o. year old male  has a past medical history of Hypertension; Coronary artery disease; Diabetes mellitus without complication; left sided neck pain. MRI consistent withmultilevel degenerative disc disease,  C4-5: disc bulging and uncovertebral joint hypertrophy with moderate spinal stenosis and severe biforaminal foraminal stenosis  C3-4, C6-7: disc bulging and uncovertebral joint hypertrophy with mild spinal stenosis and severe biforaminal foraminal stenosis  C5-6: disc bulging and uncovertebral joint hypertrophy with severe biforaminal foraminal stenosis, left worse than right   Patient has refused surgery.   Continue neck exercises from physical therapy home program Continue Cymbalta 60 mg everyday , [facility] Nilda RiggsNancy Carolyn Elexis Pollak, [facility] CorpGNP, [facility], APRN  [facility] [address], [address] [place], [place] [address] [phone]

## 2014-03-15 NOTE — Patient Instructions (Addendum)
Continue neck exercises from physical therapy home program Continue Cymbalta 60 mg everyday  Followup in 6 months

## 2014-04-11 DIAGNOSIS — M545 Low back pain, unspecified: Secondary | ICD-10-CM | POA: Diagnosis not present

## 2014-04-11 DIAGNOSIS — E119 Type 2 diabetes mellitus without complications: Secondary | ICD-10-CM | POA: Diagnosis not present

## 2014-04-11 DIAGNOSIS — F3289 Other specified depressive episodes: Secondary | ICD-10-CM | POA: Diagnosis not present

## 2014-04-11 DIAGNOSIS — E785 Hyperlipidemia, unspecified: Secondary | ICD-10-CM | POA: Diagnosis not present

## 2014-04-11 DIAGNOSIS — I10 Essential (primary) hypertension: Secondary | ICD-10-CM | POA: Diagnosis not present

## 2014-04-11 DIAGNOSIS — T7840XA Allergy, unspecified, initial encounter: Secondary | ICD-10-CM | POA: Diagnosis not present

## 2014-04-11 DIAGNOSIS — M109 Gout, unspecified: Secondary | ICD-10-CM | POA: Diagnosis not present

## 2014-04-11 DIAGNOSIS — F329 Major depressive disorder, single episode, unspecified: Secondary | ICD-10-CM | POA: Diagnosis not present

## 2014-04-11 DIAGNOSIS — F411 Generalized anxiety disorder: Secondary | ICD-10-CM | POA: Diagnosis not present

## 2014-04-23 DIAGNOSIS — F3289 Other specified depressive episodes: Secondary | ICD-10-CM | POA: Diagnosis not present

## 2014-04-23 DIAGNOSIS — E782 Mixed hyperlipidemia: Secondary | ICD-10-CM | POA: Diagnosis not present

## 2014-04-23 DIAGNOSIS — I2510 Atherosclerotic heart disease of native coronary artery without angina pectoris: Secondary | ICD-10-CM | POA: Diagnosis not present

## 2014-04-23 DIAGNOSIS — F411 Generalized anxiety disorder: Secondary | ICD-10-CM | POA: Diagnosis not present

## 2014-04-23 DIAGNOSIS — T7840XA Allergy, unspecified, initial encounter: Secondary | ICD-10-CM | POA: Diagnosis not present

## 2014-04-23 DIAGNOSIS — Z9861 Coronary angioplasty status: Secondary | ICD-10-CM | POA: Diagnosis not present

## 2014-04-23 DIAGNOSIS — I10 Essential (primary) hypertension: Secondary | ICD-10-CM | POA: Diagnosis not present

## 2014-04-23 DIAGNOSIS — M545 Low back pain, unspecified: Secondary | ICD-10-CM | POA: Diagnosis not present

## 2014-04-23 DIAGNOSIS — M109 Gout, unspecified: Secondary | ICD-10-CM | POA: Diagnosis not present

## 2014-04-23 DIAGNOSIS — E119 Type 2 diabetes mellitus without complications: Secondary | ICD-10-CM | POA: Diagnosis not present

## 2014-04-23 DIAGNOSIS — F329 Major depressive disorder, single episode, unspecified: Secondary | ICD-10-CM | POA: Diagnosis not present

## 2014-04-23 DIAGNOSIS — E785 Hyperlipidemia, unspecified: Secondary | ICD-10-CM | POA: Diagnosis not present

## 2014-05-27 DIAGNOSIS — M545 Low back pain, unspecified: Secondary | ICD-10-CM | POA: Diagnosis not present

## 2014-05-27 DIAGNOSIS — E119 Type 2 diabetes mellitus without complications: Secondary | ICD-10-CM | POA: Diagnosis not present

## 2014-05-27 DIAGNOSIS — E785 Hyperlipidemia, unspecified: Secondary | ICD-10-CM | POA: Diagnosis not present

## 2014-05-27 DIAGNOSIS — I10 Essential (primary) hypertension: Secondary | ICD-10-CM | POA: Diagnosis not present

## 2014-05-27 DIAGNOSIS — F411 Generalized anxiety disorder: Secondary | ICD-10-CM | POA: Diagnosis not present

## 2014-05-27 DIAGNOSIS — T7840XA Allergy, unspecified, initial encounter: Secondary | ICD-10-CM | POA: Diagnosis not present

## 2014-05-27 DIAGNOSIS — F3289 Other specified depressive episodes: Secondary | ICD-10-CM | POA: Diagnosis not present

## 2014-05-27 DIAGNOSIS — M109 Gout, unspecified: Secondary | ICD-10-CM | POA: Diagnosis not present

## 2014-05-27 DIAGNOSIS — F329 Major depressive disorder, single episode, unspecified: Secondary | ICD-10-CM | POA: Diagnosis not present

## 2014-07-15 DIAGNOSIS — F331 Major depressive disorder, recurrent, moderate: Secondary | ICD-10-CM | POA: Diagnosis not present

## 2014-08-21 ENCOUNTER — Encounter: Payer: Self-pay | Admitting: Neurology

## 2014-08-27 ENCOUNTER — Encounter: Payer: Self-pay | Admitting: Neurology

## 2014-09-10 DIAGNOSIS — R05 Cough: Secondary | ICD-10-CM | POA: Diagnosis not present

## 2014-09-10 DIAGNOSIS — E559 Vitamin D deficiency, unspecified: Secondary | ICD-10-CM | POA: Diagnosis not present

## 2014-09-10 DIAGNOSIS — I4892 Unspecified atrial flutter: Secondary | ICD-10-CM | POA: Diagnosis not present

## 2014-09-10 DIAGNOSIS — E119 Type 2 diabetes mellitus without complications: Secondary | ICD-10-CM | POA: Diagnosis not present

## 2014-09-10 DIAGNOSIS — F329 Major depressive disorder, single episode, unspecified: Secondary | ICD-10-CM | POA: Diagnosis not present

## 2014-09-10 DIAGNOSIS — E785 Hyperlipidemia, unspecified: Secondary | ICD-10-CM | POA: Diagnosis not present

## 2014-09-10 DIAGNOSIS — I10 Essential (primary) hypertension: Secondary | ICD-10-CM | POA: Diagnosis not present

## 2014-09-10 DIAGNOSIS — E1140 Type 2 diabetes mellitus with diabetic neuropathy, unspecified: Secondary | ICD-10-CM | POA: Diagnosis not present

## 2014-10-11 DIAGNOSIS — N400 Enlarged prostate without lower urinary tract symptoms: Secondary | ICD-10-CM | POA: Diagnosis not present

## 2014-10-11 DIAGNOSIS — E785 Hyperlipidemia, unspecified: Secondary | ICD-10-CM | POA: Diagnosis not present

## 2014-10-11 DIAGNOSIS — E1140 Type 2 diabetes mellitus with diabetic neuropathy, unspecified: Secondary | ICD-10-CM | POA: Diagnosis not present

## 2014-10-11 DIAGNOSIS — I10 Essential (primary) hypertension: Secondary | ICD-10-CM | POA: Diagnosis not present

## 2014-10-11 DIAGNOSIS — I4892 Unspecified atrial flutter: Secondary | ICD-10-CM | POA: Diagnosis not present

## 2014-10-11 DIAGNOSIS — K219 Gastro-esophageal reflux disease without esophagitis: Secondary | ICD-10-CM | POA: Diagnosis not present

## 2014-10-11 DIAGNOSIS — M545 Low back pain: Secondary | ICD-10-CM | POA: Diagnosis not present

## 2014-10-11 DIAGNOSIS — F329 Major depressive disorder, single episode, unspecified: Secondary | ICD-10-CM | POA: Diagnosis not present

## 2014-10-11 DIAGNOSIS — E559 Vitamin D deficiency, unspecified: Secondary | ICD-10-CM | POA: Diagnosis not present

## 2014-10-11 DIAGNOSIS — Z23 Encounter for immunization: Secondary | ICD-10-CM | POA: Diagnosis not present

## 2014-10-11 DIAGNOSIS — E119 Type 2 diabetes mellitus without complications: Secondary | ICD-10-CM | POA: Diagnosis not present

## 2014-11-14 DIAGNOSIS — E119 Type 2 diabetes mellitus without complications: Secondary | ICD-10-CM | POA: Diagnosis not present

## 2014-11-14 DIAGNOSIS — E782 Mixed hyperlipidemia: Secondary | ICD-10-CM | POA: Diagnosis not present

## 2014-11-14 DIAGNOSIS — I10 Essential (primary) hypertension: Secondary | ICD-10-CM | POA: Diagnosis not present

## 2014-11-14 DIAGNOSIS — Z9861 Coronary angioplasty status: Secondary | ICD-10-CM | POA: Diagnosis not present

## 2014-11-14 DIAGNOSIS — I2510 Atherosclerotic heart disease of native coronary artery without angina pectoris: Secondary | ICD-10-CM | POA: Diagnosis not present

## 2014-11-20 ENCOUNTER — Encounter (HOSPITAL_COMMUNITY): Payer: Self-pay | Admitting: Emergency Medicine

## 2014-11-20 ENCOUNTER — Emergency Department (HOSPITAL_COMMUNITY)
Admission: EM | Admit: 2014-11-20 | Discharge: 2014-11-21 | Disposition: A | Discharge status: Home / Self Care | Payer: Medicare Other | Attending: Emergency Medicine | Admitting: Emergency Medicine

## 2014-11-20 ENCOUNTER — Emergency Department (HOSPITAL_COMMUNITY): Payer: Medicare Other

## 2014-11-20 DIAGNOSIS — Z8719 Personal history of other diseases of the digestive system: Secondary | ICD-10-CM | POA: Insufficient documentation

## 2014-11-20 DIAGNOSIS — R001 Bradycardia, unspecified: Secondary | ICD-10-CM | POA: Insufficient documentation

## 2014-11-20 DIAGNOSIS — F329 Major depressive disorder, single episode, unspecified: Secondary | ICD-10-CM | POA: Diagnosis not present

## 2014-11-20 DIAGNOSIS — R7889 Finding of other specified substances, not normally found in blood: Secondary | ICD-10-CM | POA: Insufficient documentation

## 2014-11-20 DIAGNOSIS — I2510 Atherosclerotic heart disease of native coronary artery without angina pectoris: Secondary | ICD-10-CM | POA: Insufficient documentation

## 2014-11-20 DIAGNOSIS — I252 Old myocardial infarction: Secondary | ICD-10-CM | POA: Diagnosis not present

## 2014-11-20 DIAGNOSIS — D649 Anemia, unspecified: Secondary | ICD-10-CM | POA: Diagnosis not present

## 2014-11-20 DIAGNOSIS — E119 Type 2 diabetes mellitus without complications: Secondary | ICD-10-CM | POA: Diagnosis not present

## 2014-11-20 DIAGNOSIS — I10 Essential (primary) hypertension: Secondary | ICD-10-CM | POA: Insufficient documentation

## 2014-11-20 DIAGNOSIS — G4730 Sleep apnea, unspecified: Secondary | ICD-10-CM | POA: Diagnosis not present

## 2014-11-20 DIAGNOSIS — Z79899 Other long term (current) drug therapy: Secondary | ICD-10-CM | POA: Insufficient documentation

## 2014-11-20 DIAGNOSIS — Z8739 Personal history of other diseases of the musculoskeletal system and connective tissue: Secondary | ICD-10-CM | POA: Diagnosis not present

## 2014-11-20 DIAGNOSIS — Z87891 Personal history of nicotine dependence: Secondary | ICD-10-CM | POA: Insufficient documentation

## 2014-11-20 DIAGNOSIS — R072 Precordial pain: Secondary | ICD-10-CM | POA: Diagnosis not present

## 2014-11-20 DIAGNOSIS — R079 Chest pain, unspecified: Secondary | ICD-10-CM | POA: Diagnosis not present

## 2014-11-20 DIAGNOSIS — R0789 Other chest pain: Secondary | ICD-10-CM | POA: Diagnosis not present

## 2014-11-20 LAB — I-STAT TROPONIN, ED
TROPONIN I, POC: 0 ng/mL (ref 0.00–0.08)
Troponin i, poc: 0 ng/mL (ref 0.00–0.08)

## 2014-11-20 LAB — BASIC METABOLIC PANEL
Anion gap: 5 (ref 5–15)
BUN: 17 mg/dL (ref 6–23)
CO2: 29 mmol/L (ref 19–32)
CREATININE: 1.23 mg/dL (ref 0.50–1.35)
Calcium: 8.6 mg/dL (ref 8.4–10.5)
Chloride: 106 mmol/L (ref 96–112)
GFR calc Af Amer: 64 mL/min — ABNORMAL LOW (ref >90)
GFR calc non Af Amer: 55 mL/min — ABNORMAL LOW (ref >90)
Glucose, Bld: 75 mg/dL (ref 70–99)
POTASSIUM: 3.6 mmol/L (ref 3.5–5.1)
Sodium: 140 mmol/L (ref 135–145)

## 2014-11-20 LAB — CBC
HEMATOCRIT: 38.6 % — AB (ref 39.0–52.0)
Hemoglobin: 12.6 g/dL — ABNORMAL LOW (ref 13.0–17.0)
MCH: 29.1 pg (ref 26.0–34.0)
MCHC: 32.6 g/dL (ref 30.0–36.0)
MCV: 89.1 fL (ref 78.0–100.0)
Platelets: 189 10*3/uL (ref 150–400)
RBC: 4.33 MIL/uL (ref 4.22–5.81)
RDW: 14.2 % (ref 11.5–15.5)
WBC: 9.9 10*3/uL (ref 4.0–10.5)

## 2014-11-20 LAB — CBG MONITORING, ED
GLUCOSE-CAPILLARY: 111 mg/dL — AB (ref 70–99)
Glucose-Capillary: 72 mg/dL (ref 70–99)

## 2014-11-20 MED ORDER — ASPIRIN 325 MG PO TABS
325.0000 mg | ORAL_TABLET | Freq: Once | ORAL | Status: AC
  Administered 2014-11-20: 325 mg via ORAL
  Filled 2014-11-20: qty 1

## 2014-11-20 NOTE — ED Notes (Signed)
CBG 111 

## 2014-11-20 NOTE — ED Notes (Signed)
Phlebotomy at bedside.

## 2014-11-20 NOTE — ED Provider Notes (Signed)
CSN: [id number]     Arrival date & time 11/20/14  1928 History   First MD Initiated Contact with Patient 11/20/14 1930     Chief Complaint  Patient presents with  . Chest Pain     (Consider location/radiation/quality/duration/timing/severity/associated sxs/prior Treatment) HPI Comments: Austin Sawyer is a 80 y.o. male with a PMHx of HTN, CAD, prior MI, atrial flutter s/p ablation, GERD, DM2, depression, cervicalgia, hearing loss, sleep apnea, and remote EtOH use, who presents to the ED with complaints of sudden onset chest pain that began at 6 PM while he was sitting on his couch at rest, and lasted approximately 10 minutes until he took 2 nitroglycerin at which time it completely resolved. He states the pain was 8/10 tightness pressure in the central chest, nonradiating, constant until he took 2 nitroglycerin, with no known aggravating factors, unchanged with breathing, movement, or exertion. He currently has no chest pain. He does endorse that he has been under some emotional stress in the last 1 week, and that this may have contributed somewhat to his chest pain today. He denies any diaphoresis, jaw/neck/back pain, dizziness, lightheadedness, syncope, headache, vision changes, shortness of breath, cough, hemoptysis, leg swelling, orthopnea, PND, recent weight gain, surgery, immobilization, recent travel, fevers, chills, abdominal pain, nausea, vomiting, diarrhea, constipation, dysuria, hematuria, numbness, tingling, or weakness. Denies IVDU or ED medication use. States he did not chew an ASA before arrival. Typically follows up with [facility], Dr. Yates Decamp.   Patient is a 80 y.o. male presenting with chest pain. The history is provided by the patient. No language interpreter was used.  Chest Pain Pain location:  Substernal area Pain quality: pressure and tightness   Pain radiates to:  Does not radiate Pain radiates to the back: no   Pain severity:  Moderate (8/10) Onset quality:   Sudden Duration:  10 minutes Timing:  Constant (until he took NTG) Progression:  Resolved Chronicity:  Recurrent Context: at rest   Relieved by:  Nitroglycerin Worsened by:  Nothing tried Ineffective treatments:  None tried Associated symptoms: no abdominal pain, no altered mental status, no anxiety, no back pain, no claudication, no cough, no diaphoresis, no dizziness, no dysphagia, no fever, no headache, no heartburn, no lower extremity edema, no nausea, no near-syncope, no numbness, no orthopnea, no palpitations, no PND, no shortness of breath, no syncope, not vomiting and no weakness   Risk factors: coronary artery disease, diabetes mellitus, high cholesterol, hypertension and male sex   Risk factors: no immobilization, no prior DVT/PE, no smoking and no surgery     Past Medical History  Diagnosis Date  . Hypertension   . Coronary artery disease   . Diabetes mellitus without complication   . MI, old   . Reflux   . Depression   . Cervicalgia   . Disturbance of skin sensation   . Coronary atherosclerosis of unspecified type of vessel, native or graft   . Unspecified hearing loss   . Unspecified sleep apnea   . Alcohol abuse, in remission   . Atrial flutter    Past Surgical History  Procedure Laterality Date  . Hernia repair    . Rotator cuff repair     Family History  Problem Relation Age of Onset  . Anxiety disorder Mother   . Cancer Mother    History  Substance Use Topics  . Smoking status: Former Games developer  . Smokeless tobacco: Never Used  . Alcohol Use: No    Review of Systems  Constitutional:  Negative for fever, chills, diaphoresis and unexpected weight change.  HENT: Negative for trouble swallowing.   Eyes: Negative for visual disturbance.  Respiratory: Negative for cough, shortness of breath and wheezing.   Cardiovascular: Positive for chest pain. Negative for palpitations, orthopnea, claudication, leg swelling, syncope, PND and near-syncope.   Gastrointestinal: Negative for heartburn, nausea, vomiting, abdominal pain, diarrhea and constipation.  Genitourinary: Negative for dysuria and hematuria.  Musculoskeletal: Negative for myalgias, back pain, arthralgias and neck pain.  Skin: Negative for color change.  Neurological: Negative for dizziness, syncope, weakness, light-headedness, numbness and headaches.  Hematological: Does not bruise/bleed easily.  Psychiatric/Behavioral: Negative for confusion.   10 Systems reviewed and are negative for acute change except as noted in the HPI.    Allergies  Review of patient's allergies indicates no known allergies.  Home Medications   Prior to Admission medications   Medication Sig Start Date End Date Taking? Authorizing Provider  colchicine 0.6 MG tablet Take 1 tablet (0.6 mg total) by mouth daily. 05/01/13   Nanine MeansJamison Lord, NP  darifenacin (ENABLEX) 7.5 MG 24 hr tablet Take 1 tablet (7.5 mg total) by mouth daily. 05/01/13   Nanine MeansJamison Lord, NP  DULoxetine (CYMBALTA) 60 MG capsule Take 1 capsule (60 mg total) by mouth daily. 09/14/13   Levert FeinsteinYijun Yan, MD  ezetimibe-simvastatin (VYTORIN) 10-80 MG per tablet Take 1 tablet by mouth at bedtime. 05/01/13   Nanine MeansJamison Lord, NP  hydrOXYzine (VISTARIL) 50 MG capsule Take 1 capsule (50 mg total) by mouth at bedtime. 05/01/13   Nanine MeansJamison Lord, NP  metFORMIN (GLUCOPHAGE-XR) 500 MG 24 hr tablet Take 1 tablet (500 mg total) by mouth daily with breakfast. 05/01/13   Nanine MeansJamison Lord, NP  mirtazapine (REMERON) 15 MG tablet Take 1 tablet (15 mg total) by mouth at bedtime as needed (insomia). 05/01/13   Nanine MeansJamison Lord, NP  nitroGLYCERIN (NITROSTAT) 0.4 MG SL tablet Place 0.4 mg under the tongue every 5 (five) minutes as needed. Chest pain    Historical Provider, MD  solifenacin (VESICARE) 5 MG tablet Take 2 tablets (10 mg total) by mouth daily. 05/01/13   Nanine MeansJamison Lord, NP  tamsulosin (FLOMAX) 0.4 MG CAPS Take 1 capsule (0.4 mg total) by mouth daily. 05/01/13   Nanine MeansJamison Lord, NP   telmisartan-hydrochlorothiazide (MICARDIS HCT) 80-25 MG per tablet Take 1 tablet by mouth daily. 05/01/13   Nanine MeansJamison Lord, NP   BP 134/69 mmHg  Pulse 58  Temp(Src) 99.1 F (37.3 C) (Oral)  Resp 16  SpO2 99% Physical Exam  Constitutional: He is oriented to person, place, and time. Vital signs are normal. He appears well-developed and well-nourished.  Non-toxic appearance. No distress.  Afebrile nontoxic NAD  HENT:  Head: Normocephalic and atraumatic.  Mouth/Throat: Oropharynx is clear and moist and mucous membranes are normal.  Eyes: Conjunctivae and EOM are normal. Right eye exhibits no discharge. Left eye exhibits no discharge.  Neck: Normal range of motion. Neck supple. No JVD present.  No JVD  Cardiovascular: Regular rhythm, normal heart sounds and intact distal pulses.  Bradycardia present.  Exam reveals no gallop and no friction rub.   No murmur heard. Reg rhythm, bradycardic in the 50s, nl s1/s2, no m/r/g, distal pulses intact, very trace pretibial edema bilaterally  Pulmonary/Chest: Effort normal and breath sounds normal. No respiratory distress. He has no decreased breath sounds. He has no wheezes. He has no rhonchi. He has no rales.  CTAB in all lung fields, no w/r/r, no hypoxia or increased WOB, speaking in full sentences, SpO2 99%  on RA   Abdominal: Soft. Normal appearance and bowel sounds are normal. He exhibits no distension. There is no tenderness. There is no rigidity, no rebound, no guarding, no tenderness at McBurney's point and negative Murphy's sign.  Musculoskeletal: Normal range of motion.  MAE x4 Strength and sensation intact Distal pulses intact Trace pretibial edema, very small amount bilaterally Neg homan's bilaterally  Neurological: He is alert and oriented to person, place, and time. He has normal strength. No sensory deficit.  Skin: Skin is warm, dry and intact. No rash noted.  Psychiatric: He has a normal mood and affect.  Nursing note and vitals  reviewed.   ED Course  Procedures (including critical care time) Labs Review Labs Reviewed  CBC - Abnormal; Notable for the following:    Hemoglobin 12.6 (*)    HCT 38.6 (*)    All other components within normal limits  BASIC METABOLIC PANEL - Abnormal; Notable for the following:    GFR calc non Af Amer 55 (*)    GFR calc Af Amer 64 (*)    All other components within normal limits  CBG MONITORING, ED - Abnormal; Notable for the following:    Glucose-Capillary 111 (*)    All other components within normal limits  I-STAT TROPOININ, ED  CBG MONITORING, ED  Rosezena Sensor, ED    Imaging Review Dg Chest 2 View  11/20/2014   CLINICAL DATA:  Initial evaluation for acute chest pain. History of hypertension, diabetes.  EXAM: CHEST  2 VIEW  COMPARISON:  Prior radiograph from 08/08/2009  FINDINGS: Cardiac and mediastinal silhouettes are stable in size and contour, and remain within normal limits. Mild tortuosity of the intrathoracic aorta noted.  There is elevation of the left hemidiaphragm with associated mild left basilar atelectasis. No focal infiltrate or pulmonary edema. No pleural effusion. No pneumothorax.  No acute osseus abnormality. Mild multilevel degenerative changes noted within the mid thoracic spine.  IMPRESSION: 1. Elevation of the left hemidiaphragm with associated mild left basilar atelectasis. 2. No other active cardiopulmonary disease identified.   Electronically Signed   By: Rise Mu M.D.   On: 11/20/2014 20:56     EKG Interpretation   Date/Time:  Wednesday November 20 2014 19:50:03 EST Ventricular Rate:  57 PR Interval:  193 QRS Duration: 95 QT Interval:  416 QTC Calculation: 405 R Axis:   61 Text Interpretation:  Sinus rhythm Probable left ventricular hypertrophy  Anterior Q waves, possibly due to LVH Nonspecific T abnormalities, lateral  leads Confirmed by COOK  MD, BRIAN ([id number]) on 11/20/2014 8:34:31 PM      MDM   Final diagnoses:  Chest pain     80 y.o. male here with CP sudden onset at rest, took 2 NTG with full resolution of symptoms, currently symptom free. Did not take ASA, will give that now. Will hold on morphine since pt symptom free. Will possibly need admission for ACS rule out given his risk factors, but could be delta trop and re-eval for symptoms. CBG initially 72, given food and increased to 111.  10:34 PM Troponin negative, CBC showing baseline anemia, BMP WNL. EKG with some nonspecific T wave changes in lateral leads, probable LVH, NSR. CXR showing some L hemidiaphragm elevation but doesn't seem to be acutely concerning since pt has no pulmonary complaints, could be poor inspiratory effort. Still chest pain free, doing well, therefore will proceed with delta trop at 11:30pm and if negative could d/c home with cardiology f/up. Will reassess shortly.  12:17 AM Continues to deny any chest pain. Patient is to be discharged with recommendation to follow up with his cardiologist in regards to today's hospital visit. Pts symptoms unlikely to be of CAD etiology and pt has not reported any CP while in my care. Labs and imaging reviewed again prior to dc. CXR and ECG with no acute abnormalities, and neg troponins x2. Pt has been advised return to the ED if develop any exertional CP- strict return precautions discussed & patient's questions answered. Pt appears reliable for follow up and is agreeable to discharge.    BP 146/73 mmHg  Pulse 56  Temp(Src) 99.1 F (37.3 C) (Oral)  Resp 20  SpO2 97%  Meds ordered this encounter  Medications  . aspirin tablet 325 mg    Sig:      Donnita [place], PA-C 11/21/14 0018  Donnetta Hutching, MD 11/21/14 [phone]

## 2014-11-20 NOTE — ED Notes (Signed)
Per EMS: Pt began to have CP at home.  He took 2 Nitro (at same time) at home at 1805, and CP is now 0/10.  Pt denies any SOB.

## 2014-11-20 NOTE — ED Notes (Addendum)
CBG -  72  Dr. Adriana Simasook made aware. Pt provided with OJ and Austin Sawyer Crackers/PB

## 2014-11-21 NOTE — Discharge Instructions (Signed)
Your heart labs were all normal today, but you need to follow up with your cardiologist this week for recheck of symptoms. If your symptoms return and don't resolve with your home heart medications, then return to the ER immediately. If you have any changes or worsening symptoms, return to the ER.    Chest Pain (Nonspecific) It is often hard to give a specific diagnosis for the cause of chest pain. There is always a chance that your pain could be related to something serious, such as a heart attack or a blood clot in the lungs. You need to follow up with your health care provider for further evaluation. CAUSES   Heartburn.  Pneumonia or bronchitis.  Anxiety or stress.  Inflammation around your heart (pericarditis) or lung (pleuritis or pleurisy).  A blood clot in the lung.  A collapsed lung (pneumothorax). It can develop suddenly on its own (spontaneous pneumothorax) or from trauma to the chest.  Shingles infection (herpes zoster virus). The chest wall is composed of bones, muscles, and cartilage. Any of these can be the source of the pain.  The bones can be bruised by injury.  The muscles or cartilage can be strained by coughing or overwork.  The cartilage can be affected by inflammation and become sore (costochondritis). DIAGNOSIS  Lab tests or other studies may be needed to find the cause of your pain. Your health care provider may have you take a test called an ambulatory electrocardiogram (ECG). An ECG records your heartbeat patterns over a 24-hour period. You may also have other tests, such as:  Transthoracic echocardiogram (TTE). During echocardiography, sound waves are used to evaluate how blood flows through your heart.  Transesophageal echocardiogram (TEE).  Cardiac monitoring. This allows your health care provider to monitor your heart rate and rhythm in real time.  Holter monitor. This is a portable device that records your heartbeat and can help diagnose heart  arrhythmias. It allows your health care provider to track your heart activity for several days, if needed.  Stress tests by exercise or by giving medicine that makes the heart beat faster. TREATMENT   Treatment depends on what may be causing your chest pain. Treatment may include:  Acid blockers for heartburn.  Anti-inflammatory medicine.  Pain medicine for inflammatory conditions.  Antibiotics if an infection is present.  You may be advised to change lifestyle habits. This includes stopping smoking and avoiding alcohol, caffeine, and chocolate.  You may be advised to keep your head raised (elevated) when sleeping. This reduces the chance of acid going backward from your stomach into your esophagus. Most of the time, nonspecific chest pain will improve within 2-3 days with rest and mild pain medicine.  HOME CARE INSTRUCTIONS   If antibiotics were prescribed, take them as directed. Finish them even if you start to feel better.  For the next few days, avoid physical activities that bring on chest pain. Continue physical activities as directed.  Do not use any tobacco products, including cigarettes, chewing tobacco, or electronic cigarettes.  Avoid drinking alcohol.  Only take medicine as directed by your health care provider.  Follow your health care provider's suggestions for further testing if your chest pain does not go away.  Keep any follow-up appointments you made. If you do not go to an appointment, you could develop lasting (chronic) problems with pain. If there is any problem keeping an appointment, call to reschedule. SEEK MEDICAL CARE IF:   Your chest pain does not go away, even after  treatment.  You have a rash with blisters on your chest.  You have a fever. SEEK IMMEDIATE MEDICAL CARE IF:   You have increased chest pain or pain that spreads to your arm, neck, jaw, back, or abdomen.  You have shortness of breath.  You have an increasing cough, or you cough up  blood.  You have severe back or abdominal pain.  You feel nauseous or vomit.  You have severe weakness.  You faint.  You have chills. This is an emergency. Do not wait to see if the pain will go away. Get medical help at once. Call your local emergency services (911 in [place] Do not drive yourself to the hospital. MAKE SURE YOU:   Understand these instructions.  Will watch your condition.  Will get help right away if you are not doing well or get worse. Document Released: 06/30/2005 Document Revised: 09/25/2013 Document Reviewed: 04/25/2008 [facility] Patient Information 2015 [place], [place]. This information is not intended to replace advice given to you by your health care provider. Make sure you discuss any questions you have with your health care provider.

## 2014-11-21 NOTE — ED Notes (Signed)
Cab called for pt, will provide with warm blanket (no jacket).

## 2014-11-25 DIAGNOSIS — E785 Hyperlipidemia, unspecified: Secondary | ICD-10-CM | POA: Diagnosis not present

## 2014-11-25 DIAGNOSIS — M545 Low back pain: Secondary | ICD-10-CM | POA: Diagnosis not present

## 2014-11-25 DIAGNOSIS — K219 Gastro-esophageal reflux disease without esophagitis: Secondary | ICD-10-CM | POA: Diagnosis not present

## 2014-11-25 DIAGNOSIS — E559 Vitamin D deficiency, unspecified: Secondary | ICD-10-CM | POA: Diagnosis not present

## 2014-11-25 DIAGNOSIS — I4892 Unspecified atrial flutter: Secondary | ICD-10-CM | POA: Diagnosis not present

## 2014-11-25 DIAGNOSIS — I10 Essential (primary) hypertension: Secondary | ICD-10-CM | POA: Diagnosis not present

## 2014-11-25 DIAGNOSIS — F329 Major depressive disorder, single episode, unspecified: Secondary | ICD-10-CM | POA: Diagnosis not present

## 2014-11-25 DIAGNOSIS — N400 Enlarged prostate without lower urinary tract symptoms: Secondary | ICD-10-CM | POA: Diagnosis not present

## 2014-11-25 DIAGNOSIS — E1140 Type 2 diabetes mellitus with diabetic neuropathy, unspecified: Secondary | ICD-10-CM | POA: Diagnosis not present

## 2014-11-25 DIAGNOSIS — E119 Type 2 diabetes mellitus without complications: Secondary | ICD-10-CM | POA: Diagnosis not present

## 2014-12-23 DIAGNOSIS — I4892 Unspecified atrial flutter: Secondary | ICD-10-CM | POA: Diagnosis not present

## 2014-12-23 DIAGNOSIS — K219 Gastro-esophageal reflux disease without esophagitis: Secondary | ICD-10-CM | POA: Diagnosis not present

## 2014-12-23 DIAGNOSIS — M545 Low back pain: Secondary | ICD-10-CM | POA: Diagnosis not present

## 2014-12-23 DIAGNOSIS — E1140 Type 2 diabetes mellitus with diabetic neuropathy, unspecified: Secondary | ICD-10-CM | POA: Diagnosis not present

## 2014-12-23 DIAGNOSIS — E559 Vitamin D deficiency, unspecified: Secondary | ICD-10-CM | POA: Diagnosis not present

## 2014-12-23 DIAGNOSIS — E785 Hyperlipidemia, unspecified: Secondary | ICD-10-CM | POA: Diagnosis not present

## 2014-12-23 DIAGNOSIS — F329 Major depressive disorder, single episode, unspecified: Secondary | ICD-10-CM | POA: Diagnosis not present

## 2014-12-23 DIAGNOSIS — I10 Essential (primary) hypertension: Secondary | ICD-10-CM | POA: Diagnosis not present

## 2014-12-23 DIAGNOSIS — N400 Enlarged prostate without lower urinary tract symptoms: Secondary | ICD-10-CM | POA: Diagnosis not present

## 2014-12-23 DIAGNOSIS — E119 Type 2 diabetes mellitus without complications: Secondary | ICD-10-CM | POA: Diagnosis not present

## 2015-01-13 DIAGNOSIS — F331 Major depressive disorder, recurrent, moderate: Secondary | ICD-10-CM | POA: Diagnosis not present

## 2015-02-08 ENCOUNTER — Inpatient Hospital Stay (HOSPITAL_COMMUNITY)
Admission: EM | Admit: 2015-02-08 | Discharge: 2015-02-21 | DRG: 064 | Disposition: A | Discharge status: Skilled Nursing Facility | Payer: Medicare Other | Attending: Neurology | Admitting: Neurology

## 2015-02-08 ENCOUNTER — Encounter (HOSPITAL_COMMUNITY): Payer: Self-pay | Admitting: *Deleted

## 2015-02-08 ENCOUNTER — Emergency Department (HOSPITAL_COMMUNITY): Payer: Medicare Other

## 2015-02-08 DIAGNOSIS — E878 Other disorders of electrolyte and fluid balance, not elsewhere classified: Secondary | ICD-10-CM | POA: Diagnosis not present

## 2015-02-08 DIAGNOSIS — I609 Nontraumatic subarachnoid hemorrhage, unspecified: Secondary | ICD-10-CM | POA: Diagnosis present

## 2015-02-08 DIAGNOSIS — D72829 Elevated white blood cell count, unspecified: Secondary | ICD-10-CM | POA: Diagnosis not present

## 2015-02-08 DIAGNOSIS — I639 Cerebral infarction, unspecified: Secondary | ICD-10-CM | POA: Diagnosis not present

## 2015-02-08 DIAGNOSIS — Z4682 Encounter for fitting and adjustment of non-vascular catheter: Secondary | ICD-10-CM | POA: Diagnosis not present

## 2015-02-08 DIAGNOSIS — E119 Type 2 diabetes mellitus without complications: Secondary | ICD-10-CM | POA: Diagnosis not present

## 2015-02-08 DIAGNOSIS — Z4659 Encounter for fitting and adjustment of other gastrointestinal appliance and device: Secondary | ICD-10-CM

## 2015-02-08 DIAGNOSIS — K219 Gastro-esophageal reflux disease without esophagitis: Secondary | ICD-10-CM | POA: Diagnosis present

## 2015-02-08 DIAGNOSIS — R0902 Hypoxemia: Secondary | ICD-10-CM | POA: Diagnosis not present

## 2015-02-08 DIAGNOSIS — H9190 Unspecified hearing loss, unspecified ear: Secondary | ICD-10-CM | POA: Diagnosis present

## 2015-02-08 DIAGNOSIS — Z9289 Personal history of other medical treatment: Secondary | ICD-10-CM

## 2015-02-08 DIAGNOSIS — I252 Old myocardial infarction: Secondary | ICD-10-CM

## 2015-02-08 DIAGNOSIS — I615 Nontraumatic intracerebral hemorrhage, intraventricular: Secondary | ICD-10-CM | POA: Diagnosis present

## 2015-02-08 DIAGNOSIS — I619 Nontraumatic intracerebral hemorrhage, unspecified: Secondary | ICD-10-CM | POA: Diagnosis not present

## 2015-02-08 DIAGNOSIS — G936 Cerebral edema: Secondary | ICD-10-CM | POA: Insufficient documentation

## 2015-02-08 DIAGNOSIS — I517 Cardiomegaly: Secondary | ICD-10-CM | POA: Diagnosis not present

## 2015-02-08 DIAGNOSIS — R278 Other lack of coordination: Secondary | ICD-10-CM | POA: Diagnosis not present

## 2015-02-08 DIAGNOSIS — R451 Restlessness and agitation: Secondary | ICD-10-CM | POA: Diagnosis not present

## 2015-02-08 DIAGNOSIS — J9601 Acute respiratory failure with hypoxia: Secondary | ICD-10-CM | POA: Diagnosis not present

## 2015-02-08 DIAGNOSIS — I4892 Unspecified atrial flutter: Secondary | ICD-10-CM | POA: Diagnosis present

## 2015-02-08 DIAGNOSIS — I6789 Other cerebrovascular disease: Secondary | ICD-10-CM | POA: Diagnosis not present

## 2015-02-08 DIAGNOSIS — E1142 Type 2 diabetes mellitus with diabetic polyneuropathy: Secondary | ICD-10-CM | POA: Diagnosis present

## 2015-02-08 DIAGNOSIS — I2510 Atherosclerotic heart disease of native coronary artery without angina pectoris: Secondary | ICD-10-CM | POA: Diagnosis present

## 2015-02-08 DIAGNOSIS — E785 Hyperlipidemia, unspecified: Secondary | ICD-10-CM | POA: Insufficient documentation

## 2015-02-08 DIAGNOSIS — R1312 Dysphagia, oropharyngeal phase: Secondary | ICD-10-CM | POA: Diagnosis not present

## 2015-02-08 DIAGNOSIS — Z6833 Body mass index (BMI) 33.0-33.9, adult: Secondary | ICD-10-CM

## 2015-02-08 DIAGNOSIS — I10 Essential (primary) hypertension: Secondary | ICD-10-CM | POA: Insufficient documentation

## 2015-02-08 DIAGNOSIS — I6200 Nontraumatic subdural hemorrhage, unspecified: Secondary | ICD-10-CM | POA: Diagnosis not present

## 2015-02-08 DIAGNOSIS — I471 Supraventricular tachycardia, unspecified: Secondary | ICD-10-CM | POA: Diagnosis not present

## 2015-02-08 DIAGNOSIS — I612 Nontraumatic intracerebral hemorrhage in hemisphere, unspecified: Secondary | ICD-10-CM | POA: Diagnosis not present

## 2015-02-08 DIAGNOSIS — F329 Major depressive disorder, single episode, unspecified: Secondary | ICD-10-CM | POA: Diagnosis not present

## 2015-02-08 DIAGNOSIS — F0390 Unspecified dementia without behavioral disturbance: Secondary | ICD-10-CM | POA: Diagnosis present

## 2015-02-08 DIAGNOSIS — M6281 Muscle weakness (generalized): Secondary | ICD-10-CM | POA: Diagnosis not present

## 2015-02-08 DIAGNOSIS — G9340 Encephalopathy, unspecified: Secondary | ICD-10-CM

## 2015-02-08 DIAGNOSIS — S0636AA Traumatic hemorrhage of cerebrum, unspecified, with loss of consciousness status unknown, initial encounter: Secondary | ICD-10-CM

## 2015-02-08 DIAGNOSIS — R0602 Shortness of breath: Secondary | ICD-10-CM | POA: Diagnosis not present

## 2015-02-08 DIAGNOSIS — I5020 Unspecified systolic (congestive) heart failure: Secondary | ICD-10-CM | POA: Diagnosis not present

## 2015-02-08 DIAGNOSIS — I611 Nontraumatic intracerebral hemorrhage in hemisphere, cortical: Secondary | ICD-10-CM | POA: Diagnosis not present

## 2015-02-08 DIAGNOSIS — R030 Elevated blood-pressure reading, without diagnosis of hypertension: Secondary | ICD-10-CM | POA: Diagnosis not present

## 2015-02-08 DIAGNOSIS — E669 Obesity, unspecified: Secondary | ICD-10-CM | POA: Diagnosis present

## 2015-02-08 DIAGNOSIS — E876 Hypokalemia: Secondary | ICD-10-CM | POA: Diagnosis present

## 2015-02-08 DIAGNOSIS — E870 Hyperosmolality and hypernatremia: Secondary | ICD-10-CM | POA: Diagnosis not present

## 2015-02-08 DIAGNOSIS — R0989 Other specified symptoms and signs involving the circulatory and respiratory systems: Secondary | ICD-10-CM | POA: Diagnosis not present

## 2015-02-08 DIAGNOSIS — I509 Heart failure, unspecified: Secondary | ICD-10-CM | POA: Diagnosis present

## 2015-02-08 DIAGNOSIS — R51 Headache: Secondary | ICD-10-CM | POA: Diagnosis not present

## 2015-02-08 DIAGNOSIS — E871 Hypo-osmolality and hyponatremia: Secondary | ICD-10-CM | POA: Diagnosis not present

## 2015-02-08 DIAGNOSIS — G4730 Sleep apnea, unspecified: Secondary | ICD-10-CM | POA: Diagnosis present

## 2015-02-08 DIAGNOSIS — J9811 Atelectasis: Secondary | ICD-10-CM | POA: Diagnosis not present

## 2015-02-08 DIAGNOSIS — I259 Chronic ischemic heart disease, unspecified: Secondary | ICD-10-CM | POA: Diagnosis not present

## 2015-02-08 DIAGNOSIS — I610 Nontraumatic intracerebral hemorrhage in hemisphere, subcortical: Secondary | ICD-10-CM | POA: Diagnosis not present

## 2015-02-08 DIAGNOSIS — R41841 Cognitive communication deficit: Secondary | ICD-10-CM | POA: Diagnosis not present

## 2015-02-08 DIAGNOSIS — R4781 Slurred speech: Secondary | ICD-10-CM | POA: Diagnosis present

## 2015-02-08 DIAGNOSIS — Z87898 Personal history of other specified conditions: Secondary | ICD-10-CM | POA: Diagnosis not present

## 2015-02-08 DIAGNOSIS — I472 Ventricular tachycardia: Secondary | ICD-10-CM | POA: Diagnosis not present

## 2015-02-08 DIAGNOSIS — I629 Nontraumatic intracranial hemorrhage, unspecified: Secondary | ICD-10-CM | POA: Diagnosis not present

## 2015-02-08 LAB — RAPID URINE DRUG SCREEN, HOSP PERFORMED
Amphetamines: NOT DETECTED
Barbiturates: NOT DETECTED
Benzodiazepines: NOT DETECTED
COCAINE: NOT DETECTED
Opiates: NOT DETECTED
TETRAHYDROCANNABINOL: NOT DETECTED

## 2015-02-08 LAB — I-STAT CHEM 8, ED
BUN: 12 mg/dL (ref 6–20)
CALCIUM ION: 1.08 mmol/L — AB (ref 1.13–1.30)
CHLORIDE: 104 mmol/L (ref 101–111)
Creatinine, Ser: 1 mg/dL (ref 0.61–1.24)
Glucose, Bld: 110 mg/dL — ABNORMAL HIGH (ref 70–99)
HCT: 43 % (ref 39.0–52.0)
Hemoglobin: 14.6 g/dL (ref 13.0–17.0)
Potassium: 3.6 mmol/L (ref 3.5–5.1)
Sodium: 143 mmol/L (ref 135–145)
TCO2: 22 mmol/L (ref 0–100)

## 2015-02-08 LAB — CBC
HCT: 39.8 % (ref 39.0–52.0)
HEMOGLOBIN: 13 g/dL (ref 13.0–17.0)
MCH: 29.1 pg (ref 26.0–34.0)
MCHC: 32.7 g/dL (ref 30.0–36.0)
MCV: 89 fL (ref 78.0–100.0)
Platelets: 166 10*3/uL (ref 150–400)
RBC: 4.47 MIL/uL (ref 4.22–5.81)
RDW: 14.1 % (ref 11.5–15.5)
WBC: 10.2 10*3/uL (ref 4.0–10.5)

## 2015-02-08 LAB — COMPREHENSIVE METABOLIC PANEL
ALBUMIN: 3.7 g/dL (ref 3.5–5.0)
ALK PHOS: 54 U/L (ref 38–126)
ALT: 25 U/L (ref 17–63)
ANION GAP: 6 (ref 5–15)
AST: 28 U/L (ref 15–41)
BUN: 14 mg/dL (ref 6–20)
CO2: 26 mmol/L (ref 22–32)
Calcium: 8.3 mg/dL — ABNORMAL LOW (ref 8.9–10.3)
Chloride: 108 mmol/L (ref 101–111)
Creatinine, Ser: 0.99 mg/dL (ref 0.61–1.24)
GFR calc non Af Amer: >60 mL/min (ref >60)
Glucose, Bld: 111 mg/dL — ABNORMAL HIGH (ref 70–99)
POTASSIUM: 3.7 mmol/L (ref 3.5–5.1)
SODIUM: 140 mmol/L (ref 135–145)
Total Bilirubin: 0.6 mg/dL (ref 0.3–1.2)
Total Protein: 6.8 g/dL (ref 6.5–8.1)

## 2015-02-08 LAB — CBG MONITORING, ED: Glucose-Capillary: 112 mg/dL — ABNORMAL HIGH (ref 70–99)

## 2015-02-08 LAB — I-STAT TROPONIN, ED: Troponin i, poc: 0 ng/mL (ref 0.00–0.08)

## 2015-02-08 LAB — DIFFERENTIAL
Basophils Absolute: 0.1 10*3/uL (ref 0.0–0.1)
Basophils Relative: 1 % (ref 0–1)
Eosinophils Absolute: 0.5 10*3/uL (ref 0.0–0.7)
Eosinophils Relative: 5 % (ref 0–5)
Lymphocytes Relative: 16 % (ref 12–46)
Lymphs Abs: 1.7 10*3/uL (ref 0.7–4.0)
Monocytes Absolute: 0.8 10*3/uL (ref 0.1–1.0)
Monocytes Relative: 8 % (ref 3–12)
Neutro Abs: 7.3 10*3/uL (ref 1.7–7.7)
Neutrophils Relative %: 70 % (ref 43–77)

## 2015-02-08 LAB — URINE MICROSCOPIC-ADD ON

## 2015-02-08 LAB — URINALYSIS, ROUTINE W REFLEX MICROSCOPIC
Bilirubin Urine: NEGATIVE
Glucose, UA: NEGATIVE mg/dL
Hgb urine dipstick: NEGATIVE
KETONES UR: NEGATIVE mg/dL
Leukocytes, UA: NEGATIVE
NITRITE: NEGATIVE
Protein, ur: 100 mg/dL — AB
Specific Gravity, Urine: 1.016 (ref 1.005–1.030)
Urobilinogen, UA: 0.2 mg/dL (ref 0.0–1.0)
pH: 6.5 (ref 5.0–8.0)

## 2015-02-08 LAB — PROTIME-INR
INR: 1.07 (ref 0.00–1.49)
Prothrombin Time: 14 seconds (ref 11.6–15.2)

## 2015-02-08 LAB — ETHANOL

## 2015-02-08 LAB — APTT: aPTT: 27 seconds (ref 24–37)

## 2015-02-08 MED ORDER — ACETAMINOPHEN-CODEINE #3 300-30 MG PO TABS
1.0000 | ORAL_TABLET | ORAL | Status: DC | PRN
  Administered 2015-02-08: 2 via ORAL
  Filled 2015-02-08: qty 2

## 2015-02-08 MED ORDER — ACETAMINOPHEN 325 MG PO TABS
650.0000 mg | ORAL_TABLET | ORAL | Status: DC | PRN
  Administered 2015-02-11 – 2015-02-18 (×8): 650 mg via ORAL
  Filled 2015-02-08 (×8): qty 2

## 2015-02-08 MED ORDER — ONDANSETRON HCL 4 MG/2ML IJ SOLN
4.0000 mg | Freq: Once | INTRAMUSCULAR | Status: AC
  Administered 2015-02-08: 4 mg via INTRAVENOUS
  Filled 2015-02-08: qty 2

## 2015-02-08 MED ORDER — SENNOSIDES-DOCUSATE SODIUM 8.6-50 MG PO TABS
1.0000 | ORAL_TABLET | Freq: Two times a day (BID) | ORAL | Status: DC
  Administered 2015-02-11 – 2015-02-21 (×9): 1 via ORAL
  Filled 2015-02-08 (×27): qty 1

## 2015-02-08 MED ORDER — PANTOPRAZOLE SODIUM 40 MG IV SOLR
40.0000 mg | Freq: Every day | INTRAVENOUS | Status: DC
  Administered 2015-02-08 – 2015-02-17 (×10): 40 mg via INTRAVENOUS
  Filled 2015-02-08 (×11): qty 40

## 2015-02-08 MED ORDER — SODIUM CHLORIDE 0.9 % IV SOLN
INTRAVENOUS | Status: DC
  Administered 2015-02-08 – 2015-02-09 (×2): via INTRAVENOUS
  Administered 2015-02-09: 1000 mL via INTRAVENOUS
  Administered 2015-02-10: 10:00:00 via INTRAVENOUS

## 2015-02-08 MED ORDER — VITAMIN K1 10 MG/ML IJ SOLN: 10.0000 mg | INTRAVENOUS | Status: DC

## 2015-02-08 MED ORDER — NICARDIPINE HCL IN NACL 20-0.86 MG/200ML-% IV SOLN
3.0000 mg/h | INTRAVENOUS | Status: DC
  Administered 2015-02-09 (×2): 5 mg/h via INTRAVENOUS
  Administered 2015-02-09: 7.5 mg/h via INTRAVENOUS
  Filled 2015-02-08 (×3): qty 200

## 2015-02-08 MED ORDER — LABETALOL HCL 5 MG/ML IV SOLN
10.0000 mg | INTRAVENOUS | Status: DC | PRN
  Administered 2015-02-09 – 2015-02-12 (×2): 20 mg via INTRAVENOUS
  Administered 2015-02-12 (×2): 40 mg via INTRAVENOUS
  Filled 2015-02-08: qty 8
  Filled 2015-02-08: qty 4
  Filled 2015-02-08: qty 8

## 2015-02-08 MED ORDER — EMPTY CONTAINERS FLEXIBLE MISC: 2500.0000 [IU] | Status: DC

## 2015-02-08 MED ORDER — STROKE: EARLY STAGES OF RECOVERY BOOK
Freq: Once | Status: AC
  Administered 2015-02-08: 22:00:00
  Filled 2015-02-08 (×2): qty 1

## 2015-02-08 MED ORDER — NICARDIPINE HCL IN NACL 20-0.86 MG/200ML-% IV SOLN
3.0000 mg/h | Freq: Once | INTRAVENOUS | Status: AC
Start: 2015-02-08 — End: 2015-02-08
  Administered 2015-02-08: 3 mg/h via INTRAVENOUS
  Filled 2015-02-08: qty 200

## 2015-02-08 MED ORDER — ACETAMINOPHEN 650 MG RE SUPP
650.0000 mg | RECTAL | Status: DC | PRN
  Administered 2015-02-10 – 2015-02-16 (×7): 650 mg via RECTAL
  Filled 2015-02-08 (×7): qty 1

## 2015-02-08 NOTE — ED Notes (Signed)
Report received from [employer] RN  Pt is a code stroke,  [facility]ospital doctorAmber will call report to [facility],  [facility] already called

## 2015-02-08 NOTE — ED Notes (Signed)
Per EMS they were contacted by patient d/t continued nausea and headache. Headache resolved. Pt reports feeling like he had "eaten bad food". 12 lead completed, normal result. Zofran 4mg  given in route.

## 2015-02-08 NOTE — H&P (Signed)
Admission H&P    Chief Complaint: code stroke, HA, nausea, vomiting, ICH on CT brain HPI: Austin Sawyer is an 80 y.o. male with a past medical history significant for HTN, DM type 2, CAD, MI, atrial flutter, alcohol abuse in remission, who initially presented to [facility] ED with complains of severe HA, nausea, vision blurriness, confusion, and trouble finding words. Symptoms apparently started when patient was eating. Patient's wife reported to the ED attending that he was acting normal at 4:30 pm. Code stroke was called, CT brain was obtained, personally reviewed, and demonstrated a 6.7 cm left occipital parenchymal hemorrhage with mild surrounding edema, no midline shift or intraventricular extension. Further, there is small amount of adjacent subarachnoid hemorrhage and small amountof intraventricular extension of blood as well as small subdural hematomas over the left cerebral convexity and left tentorium. The patient denies recent fall or head trauma. Complains of HA and nausea but denies vertigo, double vision, focal weakness or numbness, slurred speech, or language impairment. Patient is not taking anticoagulants, INR 1.07, PTT 27 Platelets 166.  Hypertensive with BP 187/91 mmHg in the ED LSN: 4:30 PM 02/08/15 tPA Given: no, ICH   Past Medical History  Diagnosis Date  . Hypertension   . Coronary artery disease   . Diabetes mellitus without complication   . MI, old   . Reflux   . Depression   . Cervicalgia   . Disturbance of skin sensation   . Coronary atherosclerosis of unspecified type of vessel, native or graft   . Unspecified hearing loss   . Unspecified sleep apnea   . Alcohol abuse, in remission   . Atrial flutter     Past Surgical History  Procedure Laterality Date  . Hernia repair    . Rotator cuff repair      Family History  Problem Relation Age of Onset  . Anxiety disorder Mother   . Cancer Mother    Social History:  reports that he has quit smoking. He has never used  smokeless tobacco. He reports that he does not drink alcohol or use illicit drugs. Family history: no epilepsy, brain tumors, or brain aneurysms. Allergies: No Known Allergies  Medications Prior to Admission  Medication Sig Dispense Refill  . allopurinol (ZYLOPRIM) 300 MG tablet Take 300 mg by mouth daily.    . carvedilol (COREG) 25 MG tablet Take 37.5 mg by mouth 2 (two) times daily with a meal.    . colchicine 0.6 MG tablet Take 1 tablet (0.6 mg total) by mouth daily. 30 tablet 0  . DULoxetine (CYMBALTA) 20 MG capsule Take 40 mg by mouth daily.    Marland Kitchen gabapentin (NEURONTIN) 300 MG capsule Take 300 mg by mouth daily.    Marland Kitchen losartan (COZAAR) 100 MG tablet Take 100 mg by mouth daily.    . metFORMIN (GLUCOPHAGE-XR) 500 MG 24 hr tablet Take 1 tablet (500 mg total) by mouth daily with breakfast. 30 tablet 0  . omeprazole (PRILOSEC) 20 MG capsule Take 20 mg by mouth daily.    . valsartan-hydrochlorothiazide (DIOVAN-HCT) 160-12.5 MG per tablet Take 1 tablet by mouth daily.    Marland Kitchen darifenacin (ENABLEX) 7.5 MG 24 hr tablet Take 1 tablet (7.5 mg total) by mouth daily. (Patient not taking: Reported on 11/20/2014) 30 tablet 0  . DULoxetine (CYMBALTA) 60 MG capsule Take 1 capsule (60 mg total) by mouth daily. (Patient not taking: Reported on 11/20/2014) 60 capsule 12  . ezetimibe-simvastatin (VYTORIN) 10-80 MG per tablet Take 1 tablet by mouth  at bedtime. (Patient not taking: Reported on 11/20/2014) 30 tablet 0  . hydrOXYzine (VISTARIL) 50 MG capsule Take 1 capsule (50 mg total) by mouth at bedtime. (Patient not taking: Reported on 11/20/2014) 30 capsule 0  . mirtazapine (REMERON) 15 MG tablet Take 1 tablet (15 mg total) by mouth at bedtime as needed (insomia). (Patient not taking: Reported on 11/20/2014) 30 tablet 0  . nitroGLYCERIN (NITROSTAT) 0.4 MG SL tablet Place 0.4 mg under the tongue every 5 (five) minutes as needed. Chest pain    . solifenacin (VESICARE) 5 MG tablet Take 2 tablets (10 mg total) by mouth  daily. (Patient not taking: Reported on 11/20/2014) 60 tablet 0  . tamsulosin (FLOMAX) 0.4 MG CAPS Take 1 capsule (0.4 mg total) by mouth daily. (Patient not taking: Reported on 11/20/2014) 30 capsule 0  . telmisartan-hydrochlorothiazide (MICARDIS HCT) 80-25 MG per tablet Take 1 tablet by mouth daily. (Patient not taking: Reported on 11/20/2014) 30 tablet 0    ROS: General ROS: negative for - chills, fatigue, fever, night sweats, weight gain or weight loss Psychological ROS: negative for - behavioral disorder, hallucinations, memory difficulties, mood swings or suicidal ideation Ophthalmic ROS: negative for - blurry vision, double vision or eye pain o ENT ROS: negative for - epistaxis, nasal discharge, oral lesions, sore throat, tinnitus or vertigo Allergy and Immunology ROS: negative for - hives or itchy/watery eyes Hematological and Lymphatic ROS: negative for - bleeding problems, bruising or swollen lymph nodes Endocrine ROS: negative for - galactorrhea, hair pattern changes, polydipsia/polyuria or temperature intolerance Respiratory ROS: negative for - cough, hemoptysis, shortness of breath or wheezing Cardiovascular ROS: negative for - chest pain, dyspnea on exertion, edema Gastrointestinal ROS: negative for - abdominal pain, diarrhea, hematemesis, or stool incontinence Genito-Urinary ROS: negative for - dysuria, hematuria, incontinence or urinary frequency/urgency Musculoskeletal ROS: negative for - joint swelling or muscular weakness Neurological ROS: as noted in HPI Dermatological ROS: negative for rash and skin lesion changes  Physical Examination: Blood pressure 149/79, pulse 76, temperature 97.8 F (36.6 C), temperature source Oral, resp. rate 12, height '5\' 6"'  (1.676 m), weight 101.7 kg (224 lb 3.3 oz), SpO2 98 %. HEENT-  Normocephalic, no lesions, without obvious abnormality.  Normal external eye and conjunctiva.  Normal TM's bilaterally.  Normal auditory canals and external ears.  Normal external nose, mucus membranes and septum.  Normal pharynx. Neck supple with no masses, nodes, nodules or enlargement. Cardiovascular - regular rate and rhythm,S1, S2 normal, no murmur, no gallop  Lungs - chest clear, no wheezing, rales Abdomen - soft, non-tender; bowel sounds normal; no masses,  no organomegaly Extremities - no edema, no skin discoloration, no clubbing and no cyanosis  CV: pulses palpable throughout  Skin: no rash Neurologic Examination: General: Mental Status: Alert, disoriented to [place]. Speech fluent without evidence of aphasia.  Able to follow 3 step commands without difficulty. Cranial Nerves: II: Discs flat bilaterally; Right visual fields seem to be impaired, pupils equal, round, reactive to light and accommodation III,IV, VI: ptosis not present, extra-ocular motions intact bilaterally V,VII: smile symmetric, facial light touch sensation normal bilaterally VIII: hearing normal bilaterally IX,X: uvula rises symmetrically XI: bilateral shoulder shrug XII: midline tongue extension without atrophy or fasciculations Motor: Right : Upper extremity   5/5    Left:     Upper extremity   5/5  Lower extremity   5/5     Lower extremity   5/5 Tone and bulk:normal tone throughout; no atrophy noted Sensory: Pinprick and light touch  intact throughout, bilaterally Deep Tendon Reflexes:  1+ all over Plantars: Right: downgoing   Left: downgoing Cerebellar: normal finger-to-nose,  normal heel-to-shin test Gait:  No tested due to multiple leads    Results for orders placed or performed during the hospital encounter of 02/08/15 (from the past 48 hour(s))  CBG monitoring, ED     Status: Abnormal   Collection Time: 02/08/15  5:52 PM  Result Value Ref Range   Glucose-Capillary 112 (H) 70 - 99 mg/dL  Protime-INR     Status: None   Collection Time: 02/08/15  6:23 PM  Result Value Ref Range   Prothrombin Time 14.0 11.6 - 15.2 seconds   INR 1.07 0.00 - 1.49   APTT     Status: None   Collection Time: 02/08/15  6:23 PM  Result Value Ref Range   aPTT 27 24 - 37 seconds  CBC     Status: None   Collection Time: 02/08/15  6:23 PM  Result Value Ref Range   WBC 10.2 4.0 - 10.5 K/uL   RBC 4.47 4.22 - 5.81 MIL/uL   Hemoglobin 13.0 13.0 - 17.0 g/dL   HCT 39.8 39.0 - 52.0 %   MCV 89.0 78.0 - 100.0 fL   MCH 29.1 26.0 - 34.0 pg   MCHC 32.7 30.0 - 36.0 g/dL   RDW 14.1 11.5 - 15.5 %   Platelets 166 150 - 400 K/uL  Differential     Status: None   Collection Time: 02/08/15  6:23 PM  Result Value Ref Range   Neutrophils Relative % 70 43 - 77 %   Neutro Abs 7.3 1.7 - 7.7 K/uL   Lymphocytes Relative 16 12 - 46 %   Lymphs Abs 1.7 0.7 - 4.0 K/uL   Monocytes Relative 8 3 - 12 %   Monocytes Absolute 0.8 0.1 - 1.0 K/uL   Eosinophils Relative 5 0 - 5 %   Eosinophils Absolute 0.5 0.0 - 0.7 K/uL   Basophils Relative 1 0 - 1 %   Basophils Absolute 0.1 0.0 - 0.1 K/uL  Comprehensive metabolic panel     Status: Abnormal   Collection Time: 02/08/15  6:23 PM  Result Value Ref Range   Sodium 140 135 - 145 mmol/L   Potassium 3.7 3.5 - 5.1 mmol/L   Chloride 108 101 - 111 mmol/L   CO2 26 22 - 32 mmol/L   Glucose, Bld 111 (H) 70 - 99 mg/dL   BUN 14 6 - 20 mg/dL   Creatinine, Ser 0.99 0.61 - 1.24 mg/dL   Calcium 8.3 (L) 8.9 - 10.3 mg/dL   Total Protein 6.8 6.5 - 8.1 g/dL   Albumin 3.7 3.5 - 5.0 g/dL   AST 28 15 - 41 U/L   ALT 25 17 - 63 U/L   Alkaline Phosphatase 54 38 - 126 U/L   Total Bilirubin 0.6 0.3 - 1.2 mg/dL   GFR calc non Af Amer >60 >60 mL/min   GFR calc Af Amer >60 >60 mL/min    Comment: (NOTE) The eGFR has been calculated using the CKD EPI equation. This calculation has not been validated in all clinical situations. eGFR's persistently <60 mL/min signify possible Chronic Kidney Disease.    Anion gap 6 5 - 15  Urine Drug Screen     Status: None   Collection Time: 02/08/15  6:23 PM  Result Value Ref Range   Opiates NONE DETECTED NONE DETECTED    Cocaine NONE DETECTED NONE DETECTED  Benzodiazepines NONE DETECTED NONE DETECTED   Amphetamines NONE DETECTED NONE DETECTED   Tetrahydrocannabinol NONE DETECTED NONE DETECTED   Barbiturates NONE DETECTED NONE DETECTED    Comment:        DRUG SCREEN FOR MEDICAL PURPOSES ONLY.  IF CONFIRMATION IS NEEDED FOR ANY PURPOSE, NOTIFY LAB WITHIN 5 DAYS.        LOWEST DETECTABLE LIMITS FOR URINE DRUG SCREEN Drug Class       Cutoff (ng/mL) Amphetamine      1000 Barbiturate      200 Benzodiazepine   449 Tricyclics       675 Opiates          300 Cocaine          300 THC              50   Urinalysis, Routine w reflex microscopic     Status: Abnormal   Collection Time: 02/08/15  6:23 PM  Result Value Ref Range   Color, Urine YELLOW YELLOW   APPearance CLEAR CLEAR   Specific Gravity, Urine 1.016 1.005 - 1.030   pH 6.5 5.0 - 8.0   Glucose, UA NEGATIVE NEGATIVE mg/dL   Hgb urine dipstick NEGATIVE NEGATIVE   Bilirubin Urine NEGATIVE NEGATIVE   Ketones, ur NEGATIVE NEGATIVE mg/dL   Protein, ur 100 (A) NEGATIVE mg/dL   Urobilinogen, UA 0.2 0.0 - 1.0 mg/dL   Nitrite NEGATIVE NEGATIVE   Leukocytes, UA NEGATIVE NEGATIVE  Urine microscopic-add on     Status: None   Collection Time: 02/08/15  6:23 PM  Result Value Ref Range   Squamous Epithelial / LPF RARE RARE   WBC, UA 0-2 <3 WBC/hpf   Bacteria, UA RARE RARE   Urine-Other MUCOUS PRESENT   I-stat troponin, ED (not at [facility], [facility])     Status: None   Collection Time: 02/08/15  6:25 PM  Result Value Ref Range   Troponin i, poc 0.00 0.00 - 0.08 ng/mL   Comment 3            Comment: Due to the release kinetics of cTnI, a negative result within the first hours of the onset of symptoms does not rule out myocardial infarction with certainty. If myocardial infarction is still suspected, repeat the test at appropriate intervals.   Ethanol     Status: None   Collection Time: 02/08/15  6:26 PM  Result Value Ref Range   Alcohol, Ethyl (B) <5 <5  mg/dL    Comment:        LOWEST DETECTABLE LIMIT FOR SERUM ALCOHOL IS 11 mg/dL FOR MEDICAL PURPOSES ONLY   I-Stat Chem 8, ED  (not at [facility], [facility])     Status: Abnormal   Collection Time: 02/08/15  6:27 PM  Result Value Ref Range   Sodium 143 135 - 145 mmol/L   Potassium 3.6 3.5 - 5.1 mmol/L   Chloride 104 101 - 111 mmol/L   BUN 12 6 - 20 mg/dL   Creatinine, Ser 1.00 0.61 - 1.24 mg/dL   Glucose, Bld 110 (H) 70 - 99 mg/dL   Calcium, Ion 1.08 (L) 1.13 - 1.30 mmol/L   TCO2 22 0 - 100 mmol/L   Hemoglobin 14.6 13.0 - 17.0 g/dL   HCT 43.0 39.0 - 52.0 %   Ct Head Wo Contrast  02/08/2015   CLINICAL DATA:  Code stroke. Nausea and headache. Word-finding problems.  EXAM: CT HEAD WITHOUT CONTRAST  TECHNIQUE: Contiguous axial images were obtained from the base of  the skull through the vertex without intravenous contrast.  COMPARISON:  Brain MRI 10/25/2004  FINDINGS: There is a large acute lobar parenchymal hemorrhage in the left occipital lobe measuring 6.7 x 3.2 cm (estimated volume 48 cc). There is mild surrounding edema with mass effect on the left lateral ventricle. There is a small amount of subarachnoid hemorrhage in adjacent left parieto-occipital sulci. There is a subdural hematoma extending over the left cerebral convexity measuring up to 5 mm in thickness. There is a small amount of intraventricular blood in the atrium of the left lateral ventricle. A small amount of subdural hematoma is also present along the left tentorium. There is mild regional sulcal effacement associated with the parenchymal hematoma without midline shift.  There is no hydrocephalus. There is no evidence of acute large territory infarct separate from the parenchymal hematoma. Periventricular white-matter hypodensities are nonspecific but compatible with mild chronic small vessel ischemic disease.  Orbits are unremarkable. The visualized paranasal sinuses and mastoid air cells are clear. No skull fracture is seen.  IMPRESSION: 1.  6.7 cm left occipital parenchymal hemorrhage with mild surrounding edema. No midline shift. 2. Small amount of adjacent subarachnoid hemorrhage and small amount of intraventricular extension of blood. 3. Small subdural hematomas over the left cerebral convexity and left tentorium. Critical Value/emergent results were called by telephone at the time of interpretation on 02/08/2015 at 6:40 pm to Dr. Sherwood Gambler , who verbally acknowledged these results.   Electronically Signed   By: Logan Bores   On: 02/08/2015 18:41    Assessment: 80 y.o. male with new onset HA, nausea, confusion, and blurred vision. CT brain revealeda 6.7 cm left occipital parenchymal hemorrhage with mild surrounding edema, no midline shift or intraventricular extension. Further, there is small amount of adjacent subarachnoid hemorrhage and small amountof intraventricular extension of blood as well as small subdural hematomas over the left cerebral convexity and left tentorium. SBP 187 in the ED, can still be a lobar hypertensive hemorrhage but other etiologies like CAA in the differential. Will get MRI/MRA. Patient on IV nicardipine. No much edema or midline shift, thus did not initiate osmotherapy. No antiplatelets. Follow ICH protocol. Stroke team will resume care tomorrow.  Stroke Risk Factors - HTN, DM type 2, CAD, atrial flutter  Dorian Pod, MD [facility] [phone]  02/08/2015, 10:16 PM

## 2015-02-08 NOTE — ED Notes (Signed)
Pt stated that everything took place after eating. Pt a&o to self and situation. He is unable to recall day, month or year. He is HOH.

## 2015-02-08 NOTE — ED Notes (Signed)
Bed: WA10 Expected date: 02/08/15 Expected time: 5:30 PM Means of arrival: Ambulance Comments: Nausea and vomiting

## 2015-02-08 NOTE — ED Provider Notes (Signed)
CSN: [id number]     Arrival date & time 02/08/15  1737 History   First MD Initiated Contact with Patient 02/08/15 1745     Chief Complaint  Patient presents with  . Nausea  . Headache     (Consider location/radiation/quality/duration/timing/severity/associated sxs/prior Treatment) HPI  80 year old male presents with an acute headache as well as nausea. The patient tells me that started while he was eating. He does not recall what he was eating. The patient is unable to recall the day month or year. He is very hard of hearing which limits the history they also seems to be confused. He currently complains of nausea as well as a headache. Denies any focal weakness. Unable to tell me the exact onset.  Wife eventually arrives and states that around 4:30 PM he was acting normal and in complaining of a headache, blurred vision. She thinks that his voice is different given that he is having trouble speaking.  Past Medical History  Diagnosis Date  . Hypertension   . Coronary artery disease   . Diabetes mellitus without complication   . MI, old   . Reflux   . Depression   . Cervicalgia   . Disturbance of skin sensation   . Coronary atherosclerosis of unspecified type of vessel, native or graft   . Unspecified hearing loss   . Unspecified sleep apnea   . Alcohol abuse, in remission   . Atrial flutter    Past Surgical History  Procedure Laterality Date  . Hernia repair    . Rotator cuff repair     Family History  Problem Relation Age of Onset  . Anxiety disorder Mother   . Cancer Mother    History  Substance Use Topics  . Smoking status: Former Games developer  . Smokeless tobacco: Never Used  . Alcohol Use: No    Review of Systems  Unable to perform ROS: Mental status change      Allergies  Review of patient's allergies indicates no known allergies.  Home Medications   Prior to Admission medications   Medication Sig Start Date End Date Taking? Authorizing Provider   allopurinol (ZYLOPRIM) 300 MG tablet Take 300 mg by mouth daily.    Historical Provider, MD  carvedilol (COREG) 25 MG tablet Take 37.5 mg by mouth 2 (two) times daily with a meal.    Historical Provider, MD  colchicine 0.6 MG tablet Take 1 tablet (0.6 mg total) by mouth daily. 05/01/13   Charm Rings, NP  darifenacin (ENABLEX) 7.5 MG 24 hr tablet Take 1 tablet (7.5 mg total) by mouth daily. Patient not taking: Reported on 11/20/2014 05/01/13   Charm Rings, NP  DULoxetine (CYMBALTA) 20 MG capsule Take 40 mg by mouth daily.    Historical Provider, MD  DULoxetine (CYMBALTA) 60 MG capsule Take 1 capsule (60 mg total) by mouth daily. Patient not taking: Reported on 11/20/2014 09/14/13   Levert Feinstein, MD  ezetimibe-simvastatin (VYTORIN) 10-80 MG per tablet Take 1 tablet by mouth at bedtime. Patient not taking: Reported on 11/20/2014 05/01/13   Charm Rings, NP  gabapentin (NEURONTIN) 300 MG capsule Take 300 mg by mouth daily.    Historical Provider, MD  hydrOXYzine (VISTARIL) 50 MG capsule Take 1 capsule (50 mg total) by mouth at bedtime. Patient not taking: Reported on 11/20/2014 05/01/13   Charm Rings, NP  losartan (COZAAR) 100 MG tablet Take 100 mg by mouth daily.    Historical Provider, MD  metFORMIN (GLUCOPHAGE-XR) 500 MG  24 hr tablet Take 1 tablet (500 mg total) by mouth daily with breakfast. 05/01/13   Charm RingsJamison Y Lord, NP  mirtazapine (REMERON) 15 MG tablet Take 1 tablet (15 mg total) by mouth at bedtime as needed (insomia). Patient not taking: Reported on 11/20/2014 05/01/13   Charm RingsJamison Y Lord, NP  nitroGLYCERIN (NITROSTAT) 0.4 MG SL tablet Place 0.4 mg under the tongue every 5 (five) minutes as needed. Chest pain    Historical Provider, MD  omeprazole (PRILOSEC) 20 MG capsule Take 20 mg by mouth daily.    Historical Provider, MD  solifenacin (VESICARE) 5 MG tablet Take 2 tablets (10 mg total) by mouth daily. Patient not taking: Reported on 11/20/2014 05/01/13   Charm RingsJamison Y Lord, NP  tamsulosin  (FLOMAX) 0.4 MG CAPS Take 1 capsule (0.4 mg total) by mouth daily. Patient not taking: Reported on 11/20/2014 05/01/13   Charm RingsJamison Y Lord, NP  telmisartan-hydrochlorothiazide (MICARDIS HCT) 80-25 MG per tablet Take 1 tablet by mouth daily. Patient not taking: Reported on 11/20/2014 05/01/13   Charm RingsJamison Y Lord, NP  valsartan-hydrochlorothiazide (DIOVAN-HCT) 160-12.5 MG per tablet Take 1 tablet by mouth daily.    Historical Provider, MD   BP 187/91 mmHg  Pulse 52  Temp(Src) 97.9 F (36.6 C) (Oral)  Resp 18  SpO2 95% Physical Exam  Constitutional: He appears well-developed and well-nourished.  HENT:  Head: Normocephalic and atraumatic.  Right Ear: External ear normal.  Left Ear: External ear normal.  Nose: Nose normal.  Eyes: Right eye exhibits no discharge. Left eye exhibits no discharge.  Does not follow commands for eye testing  Neck: Neck supple.  Cardiovascular: Regular rhythm, normal heart sounds and intact distal pulses.  Bradycardia present.   Pulmonary/Chest: Effort normal.  Abdominal: Soft. There is no tenderness.  Musculoskeletal: He exhibits no edema.  Neurological: He is alert. He is disoriented. GCS eye subscore is 4. GCS verbal subscore is 4. GCS motor subscore is 6.  Normal strength in all 4 extremities. Appears to somewhat neglect right side as far as turning that way or answering questions from right sided speakers.  Skin: Skin is warm and dry.  Nursing note and vitals reviewed.   ED Course  Procedures (including critical care time) Labs Review Labs Reviewed  COMPREHENSIVE METABOLIC PANEL - Abnormal; Notable for the following:    Glucose, Bld 111 (*)    Calcium 8.3 (*)    All other components within normal limits  CBG MONITORING, ED - Abnormal; Notable for the following:    Glucose-Capillary 112 (*)    All other components within normal limits  I-STAT CHEM 8, ED - Abnormal; Notable for the following:    Glucose, Bld 110 (*)    Calcium, Ion 1.08 (*)    All other  components within normal limits  ETHANOL  PROTIME-INR  APTT  CBC  DIFFERENTIAL  URINE RAPID DRUG SCREEN (HOSP PERFORMED)  URINALYSIS, ROUTINE W REFLEX MICROSCOPIC  I-STAT TROPOININ, ED    Imaging Review Ct Head Wo Contrast  02/08/2015   CLINICAL DATA:  Code stroke. Nausea and headache. Word-finding problems.  EXAM: CT HEAD WITHOUT CONTRAST  TECHNIQUE: Contiguous axial images were obtained from the base of the skull through the vertex without intravenous contrast.  COMPARISON:  Brain MRI 10/25/2004  FINDINGS: There is a large acute lobar parenchymal hemorrhage in the left occipital lobe measuring 6.7 x 3.2 cm (estimated volume 48 cc). There is mild surrounding edema with mass effect on the left lateral ventricle. There is a small  amount of subarachnoid hemorrhage in adjacent left parieto-occipital sulci. There is a subdural hematoma extending over the left cerebral convexity measuring up to 5 mm in thickness. There is a small amount of intraventricular blood in the atrium of the left lateral ventricle. A small amount of subdural hematoma is also present along the left tentorium. There is mild regional sulcal effacement associated with the parenchymal hematoma without midline shift.  There is no hydrocephalus. There is no evidence of acute large territory infarct separate from the parenchymal hematoma. Periventricular white-matter hypodensities are nonspecific but compatible with mild chronic small vessel ischemic disease.  Orbits are unremarkable. The visualized paranasal sinuses and mastoid air cells are clear. No skull fracture is seen.  IMPRESSION: 1. 6.7 cm left occipital parenchymal hemorrhage with mild surrounding edema. No midline shift. 2. Small amount of adjacent subarachnoid hemorrhage and small amount of intraventricular extension of blood. 3. Small subdural hematomas over the left cerebral convexity and left tentorium. Critical Value/emergent results were called by telephone at the time of  interpretation on 02/08/2015 at 6:40 pm to Dr. Pricilla LovelessSCOTT Pamla Pangle , who verbally acknowledged these results.   Electronically Signed   By: Sebastian AcheAllen  Grady   On: 02/08/2015 18:41     EKG Interpretation   Date/Time:  Saturday Feb 08 2015 17:41:54 EDT Ventricular Rate:  53 PR Interval:  198 QRS Duration: 93 QT Interval:  447 QTC Calculation: 420 R Axis:   64 Text Interpretation:  Sinus rhythm Probable LVH with secondary repol abnrm  Anterior Q waves, possibly due to LVH no significant change since Feb 2016  Confirmed by Criss AlvineGOLDSTON  MD, Armour Villanueva ([id number]) on 02/08/2015 6:02:01 PM      CRITICAL CARE Performed by: Pricilla LovelessGOLDSTON, Branston Halsted T   Total critical care time: 45 minutes  Critical care time was exclusive of separately billable procedures and treating other patients.  Critical care was necessary to treat or prevent imminent or life-threatening deterioration.  Critical care was time spent personally by me on the following activities: development of treatment plan with patient and/or surrogate as well as nursing, discussions with consultants, evaluation of patient's response to treatment, examination of patient, obtaining history from patient or surrogate, ordering and performing treatments and interventions, ordering and review of laboratory studies, ordering and review of radiographic studies, pulse oximetry and re-evaluation of patient's condition.  MDM   Final diagnoses:  Stroke due to intracerebral hemorrhage  Stroke due to intracerebral hemorrhage   Given patient's presentation within 3 hours of onset, a code stroke was called. CT shows a left occipital intraparenchymal hemorrhage with some mild edema but no midline shift. Patient is awake and alert but confused. He is breathing normally and has no concerning airway findings would suggest needing intubation for airway protection. He is quite hypertensive on arrival, started on nicardipine drip as his CT was seen. Dr. Thad Rangereynolds was at bedside to evaluate  patient. Family initially endorses the patient is on Coumadin which is not on his medicine list. FEIBA was ordered, then stopped as soon as INR came back normal. Will admit to the neuro ICU, stable upon transfer from this ER.   Pricilla LovelessScott Eliyohu Class, MD 02/09/15 [phone]

## 2015-02-08 NOTE — ED Notes (Signed)
Attempted to call report, CN unable at this time d/t unknown room assignment. Per secretary CN is to follow up.

## 2015-02-09 ENCOUNTER — Inpatient Hospital Stay (HOSPITAL_COMMUNITY): Payer: Medicare Other

## 2015-02-09 DIAGNOSIS — E785 Hyperlipidemia, unspecified: Secondary | ICD-10-CM

## 2015-02-09 DIAGNOSIS — I4892 Unspecified atrial flutter: Secondary | ICD-10-CM

## 2015-02-09 DIAGNOSIS — F1010 Alcohol abuse, uncomplicated: Secondary | ICD-10-CM

## 2015-02-09 DIAGNOSIS — I10 Essential (primary) hypertension: Secondary | ICD-10-CM

## 2015-02-09 LAB — BASIC METABOLIC PANEL
ANION GAP: 15 (ref 5–15)
BUN: 10 mg/dL (ref 6–20)
CALCIUM: 8.6 mg/dL — AB (ref 8.9–10.3)
CO2: 22 mmol/L (ref 22–32)
Chloride: 97 mmol/L — ABNORMAL LOW (ref 101–111)
Creatinine, Ser: 0.9 mg/dL (ref 0.61–1.24)
GFR calc Af Amer: >60 mL/min (ref >60)
GFR calc non Af Amer: >60 mL/min (ref >60)
Glucose, Bld: 178 mg/dL — ABNORMAL HIGH (ref 70–99)
Potassium: 3.3 mmol/L — ABNORMAL LOW (ref 3.5–5.1)
SODIUM: 134 mmol/L — AB (ref 135–145)

## 2015-02-09 LAB — LIPID PANEL
CHOL/HDL RATIO: 2.9 ratio
Cholesterol: 178 mg/dL (ref 0–200)
HDL: 62 mg/dL (ref >40)
LDL Cholesterol: 107 mg/dL — ABNORMAL HIGH (ref 0–99)
Triglycerides: 47 mg/dL (ref <150)
VLDL: 9 mg/dL (ref 0–40)

## 2015-02-09 LAB — GLUCOSE, CAPILLARY
GLUCOSE-CAPILLARY: 136 mg/dL — AB (ref 70–99)
GLUCOSE-CAPILLARY: 159 mg/dL — AB (ref 70–99)

## 2015-02-09 LAB — CBC
HCT: 42.6 % (ref 39.0–52.0)
Hemoglobin: 14.3 g/dL (ref 13.0–17.0)
MCH: 29.1 pg (ref 26.0–34.0)
MCHC: 33.6 g/dL (ref 30.0–36.0)
MCV: 86.6 fL (ref 78.0–100.0)
PLATELETS: 180 10*3/uL (ref 150–400)
RBC: 4.92 MIL/uL (ref 4.22–5.81)
RDW: 14.1 % (ref 11.5–15.5)
WBC: 18.4 10*3/uL — ABNORMAL HIGH (ref 4.0–10.5)

## 2015-02-09 LAB — MRSA PCR SCREENING: MRSA by PCR: NEGATIVE

## 2015-02-09 LAB — TSH: TSH: 1.154 u[IU]/mL (ref 0.350–4.500)

## 2015-02-09 MED ORDER — INSULIN ASPART 100 UNIT/ML ~~LOC~~ SOLN
0.0000 [IU] | Freq: Every day | SUBCUTANEOUS | Status: DC
Start: 2015-02-09 — End: 2015-02-10

## 2015-02-09 MED ORDER — INSULIN ASPART 100 UNIT/ML ~~LOC~~ SOLN
0.0000 [IU] | Freq: Three times a day (TID) | SUBCUTANEOUS | Status: DC
  Administered 2015-02-09 – 2015-02-10 (×2): 2 [IU] via SUBCUTANEOUS
  Administered 2015-02-10: 3 [IU] via SUBCUTANEOUS

## 2015-02-09 MED ORDER — CLEVIDIPINE BUTYRATE 0.5 MG/ML IV EMUL
0.0000 mg/h | INTRAVENOUS | Status: DC
  Administered 2015-02-09: 1 mg/h via INTRAVENOUS
  Administered 2015-02-09: 14 mg/h via INTRAVENOUS
  Administered 2015-02-09: 15 mg/h via INTRAVENOUS
  Administered 2015-02-09: 10 mg/h via INTRAVENOUS
  Administered 2015-02-09 (×2): 13 mg/h via INTRAVENOUS
  Administered 2015-02-10: 15 mg/h via INTRAVENOUS
  Administered 2015-02-10: 16 mg/h via INTRAVENOUS
  Administered 2015-02-10: 15 mg/h via INTRAVENOUS
  Administered 2015-02-10: 17 mg/h via INTRAVENOUS
  Administered 2015-02-10: 15 mg/h via INTRAVENOUS
  Administered 2015-02-10: 21 mg/h via INTRAVENOUS
  Administered 2015-02-10: 20 mg/h via INTRAVENOUS
  Administered 2015-02-10: 18 mg/h via INTRAVENOUS
  Administered 2015-02-10: 21 mg/h via INTRAVENOUS
  Administered 2015-02-10: 12 mg/h via INTRAVENOUS
  Administered 2015-02-10: 6 mg/h via INTRAVENOUS
  Filled 2015-02-09 (×132): qty 50

## 2015-02-09 NOTE — Progress Notes (Signed)
SLP Cancellation Note  Patient Details Name: Austin Sawyer MRN: [id number] DOB: 06/24/1935   Cancelled treatment:       Reason Eval/Treat Not Completed: Patient declined, no reason specified (Pt asked to be left alone.)  ST will follow up tomorrow in attempt to complete swallow and language evaluation.   Fleet ContrasGoodman, Kian Ottaviano N 02/09/2015, 7:55 AM Dimas AguasMelissa Antwone Capozzoli, MA, CCC-SLP Acute Rehab SLP [phone]

## 2015-02-09 NOTE — Progress Notes (Signed)
UR completed 

## 2015-02-09 NOTE — Progress Notes (Signed)
STROKE TEAM PROGRESS NOTE   SUBJECTIVE (INTERVAL HISTORY) His friends are at the bedside.  Overall he feels his condition is unchanged. He still has mild agitation, word salad, spontaneous speech but largely wernicke's aphasia. Not able to do MRI brain, will repeat CT head.    OBJECTIVE Temp:  [97.7 F (36.5 C)-98.3 F (36.8 C)] 98.2 F (36.8 C) (05/08 1145) Pulse Rate:  [50-109] 68 (05/08 1215) Cardiac Rhythm:  [-] Normal sinus rhythm (05/08 1000) Resp:  [9-29] 13 (05/08 1215) BP: (131-199)/(65-113) 169/70 mmHg (05/08 1215) SpO2:  [88 %-100 %] 99 % (05/08 1215) Weight:  [224 lb 3.3 oz (101.7 kg)-240 lb (108.863 kg)] 224 lb 3.3 oz (101.7 kg) (05/07 2145)   Recent Labs Lab 02/08/15 1752  GLUCAP 112*    Recent Labs Lab 02/08/15 1823 02/08/15 1827 02/09/15 0745  NA 140 143 134*  K 3.7 3.6 3.3*  CL 108 104 97*  CO2 26  --  22  GLUCOSE 111* 110* 178*  BUN 14 12 10   CREATININE 0.99 1.00 0.90  CALCIUM 8.3*  --  8.6*    Recent Labs Lab 02/08/15 1823  AST 28  ALT 25  ALKPHOS 54  BILITOT 0.6  PROT 6.8  ALBUMIN 3.7    Recent Labs Lab 02/08/15 1823 02/08/15 1827 02/09/15 0745  WBC 10.2  --  18.4*  NEUTROABS 7.3  --   --   HGB 13.0 14.6 14.3  HCT 39.8 43.0 42.6  MCV 89.0  --  86.6  PLT 166  --  180   No results for input(s): CKTOTAL, CKMB, CKMBINDEX, TROPONINI in the last 168 hours.  Recent Labs  02/08/15 1823  LABPROT 14.0  INR 1.07    Recent Labs  02/08/15 1823  COLORURINE YELLOW  LABSPEC 1.016  PHURINE 6.5  GLUCOSEU NEGATIVE  HGBUR NEGATIVE  BILIRUBINUR NEGATIVE  KETONESUR NEGATIVE  PROTEINUR 100*  UROBILINOGEN 0.2  NITRITE NEGATIVE  LEUKOCYTESUR NEGATIVE       Component Value Date/Time   CHOL 178 02/09/2015 0745   TRIG 47 02/09/2015 0745   HDL 62 02/09/2015 0745   CHOLHDL 2.9 02/09/2015 0745   VLDL 9 02/09/2015 0745   LDLCALC 107* 02/09/2015 0745   Lab Results  Component Value Date   HGBA1C  08/11/2009    5.8 (NOTE) The ADA  recommends the following therapeutic goal for glycemic control related to Hgb A1c measurement: Goal of therapy: <6.5 Hgb A1c  Reference: American Diabetes Association: Clinical Practice Recommendations 2010, Diabetes Care, 2010, 33: (Suppl  1).      Component Value Date/Time   LABOPIA NONE DETECTED 02/08/2015 1823   COCAINSCRNUR NONE DETECTED 02/08/2015 1823   LABBENZ NONE DETECTED 02/08/2015 1823   AMPHETMU NONE DETECTED 02/08/2015 1823   THCU NONE DETECTED 02/08/2015 1823   LABBARB NONE DETECTED 02/08/2015 1823     Recent Labs Lab 02/08/15 1826  ETH <5    I have personally reviewed the radiological images below and agree with the radiology interpretations.  Ct Head Wo Contrast  02/09/2015   IMPRESSION: 1. Slight enlargement of large left occipital lobe intra-axial hemorrhage. Estimated hemorrhage volume now 58 mm. Stable left posterior hemisphere edema and mass effect. 2. Mild progression of subarachnoid extension, now bilateral. Extension and of a left subdural space with small broad-based left subdural hematoma not significantly changed. Stable intraventricular hemorrhage volume. No ventriculomegaly. 3. No new intracranial abnormality identified.      02/08/2015   IMPRESSION: 1. 6.7 cm left occipital parenchymal hemorrhage with  mild surrounding edema. No midline shift. 2. Small amount of adjacent subarachnoid hemorrhage and small amount of intraventricular extension of blood. 3. Small subdural hematomas over the left cerebral convexity and left tentorium.   Dg Chest Port 1 View  02/09/2015    IMPRESSION: No acute cardiopulmonary disease.     Carotid Doppler  pending  2D Echocardiogram  pending  EKG  NSR  PHYSICAL EXAM  Temp:  [97.7 F (36.5 C)-98.3 F (36.8 C)] 98.2 F (36.8 C) (05/08 1145) Pulse Rate:  [50-109] 68 (05/08 1215) Resp:  [9-29] 13 (05/08 1215) BP: (131-199)/(65-113) 169/70 mmHg (05/08 1215) SpO2:  [88 %-100 %] 99 % (05/08 1215) Weight:  [224 lb 3.3 oz (101.7  kg)-240 lb (108.863 kg)] 224 lb 3.3 oz (101.7 kg) (05/07 2145)  General - Well nourished, well developed, mild agitation.  Ophthalmologic - fundi not visualized due to incorporation.  Cardiovascular - Regular rate and rhythm.  Mental Status -  Awake, alert, right-sided neglect, not following commands, word salad, spontaneous speech, consistent with Wernicke's aphasia, not able to name or repeat.  Cranial Nerves II - XII - II - not blinking to visual threat to the right. III, IV, VI - left gaze preference, barely cross midline. V - not able to test due to aphasia. VII - right nasolabial fold flattening. VIII - Hearing intact bilaterally. X - not able to test due to aphasia. XI - not able to test due to aphasia. XII - Tongue midline inside mouth.  Motor Strength - The patient's strength was slightly weaker on the right UE and LE than left UE and LE respectively, however difficult to precisely evaluate due to aphasia. Bulk was normal and fasciculations were absent.   Motor Tone - Muscle tone was assessed at the neck and appendages and was normal.  Reflexes - The patient's reflexes were symmetrical in all extremities and he had no pathological reflexes.  Sensory - symmetric to pain stimulation bilaterally.    Coordination - not able to test due to aphasia.  Tremor was absent.  Gait and Station - not tested.   ASSESSMENT/PLAN [name] is a 80 y.o. male with history of HTN, DM, CAD, a flutter on Coumadin before until 2010, alcohol abuse admitted for headache, nausea vomiting, confusion, blurred vision. CT had showed left occipital ICH with small SAH and IVH. Symptoms and changed.    Left occipital ICH with small SAH and IVH - etiology unclear, HTN versus CAA  MRI  not tolerating  MRA  not tolerating  Repeat CT showed stable hematoma with slight extension of SAH  Carotid Doppler  pending  2D Echo  pending  LDL 107  HgbA1c pending  SCDs for VTE prophylaxis  Diet  NPO time specified   no antithrombotic prior to admission, now on no antithrombotic due to ICH  Ongoing aggressive stroke risk factor management  Therapy recommendations:  Pending  Disposition:  Pending  Diabetes  HgbA1c pending goal < 7.0  Controlled  CBG monitoring  SSI  Hypertension  Home meds:   Coreg, losartan, BP goal < 160 Currently on cleviprex drip  Unstable  Add PO meds once passed swallow  Hyperlipidemia  Home meds:   Vytorin  Currently on no med due to NPO  LDL 107, goal < 70  Resume home meds once passed swallow  Continue statin at discharge  A. flutter  Patient history of a flutter  Was on Coumadin in the past  no anticoagulation or antiplatelet PTA  No  a flutter on telemetry  Patient may not be a candidate for anticoagulation  Other Stroke Risk Factors  Advanced age  ETOH use  Obesity, Body mass index is 36.21 kg/(m^2).   Other Active Problems  Mild hypokalemia  Leukocytosis  Other Pertinent History    Hospital day # 1  This patient is critically ill due to large left occipital ICH with SAH and IVH and at significant risk of neurological worsening, death form rebleeding, cerebral edema, brain herniation, and seizure. This patient's care requires constant monitoring of vital signs, hemodynamics, respiratory and cardiac monitoring, review of multiple databases, neurological assessment, discussion with family, other specialists and medical decision making of high complexity. I spent 45 minutes of neurocritical care time in the care of this patient.  Marvel Plan, MD PhD Stroke Neurology 02/09/2015 12:33 PM    To contact Stroke Continuity provider, please refer to [email or web address]. After hours, contact General Neurology

## 2015-02-09 NOTE — Progress Notes (Signed)
PT Cancellation Note  Patient Details Name: Austin Sawyer MRN: [id number] DOB: 11/23/1934   Cancelled Treatment:    Reason Eval/Treat Not Completed: Patient not medically ready (Pt currently on bedrest.) Will need increased activity orders for PT eval.   Alleah Dearman 02/09/2015, 11:27 AM  [facility] PT [phone]

## 2015-02-10 ENCOUNTER — Inpatient Hospital Stay (HOSPITAL_COMMUNITY): Payer: Medicare Other

## 2015-02-10 DIAGNOSIS — J9601 Acute respiratory failure with hypoxia: Secondary | ICD-10-CM

## 2015-02-10 DIAGNOSIS — E871 Hypo-osmolality and hyponatremia: Secondary | ICD-10-CM

## 2015-02-10 LAB — CBC
HEMATOCRIT: 44.6 % (ref 39.0–52.0)
HEMOGLOBIN: 15.6 g/dL (ref 13.0–17.0)
MCH: 29.3 pg (ref 26.0–34.0)
MCHC: 35 g/dL (ref 30.0–36.0)
MCV: 83.8 fL (ref 78.0–100.0)
Platelets: 180 10*3/uL (ref 150–400)
RBC: 5.32 MIL/uL (ref 4.22–5.81)
RDW: 14 % (ref 11.5–15.5)
WBC: 23.7 10*3/uL — ABNORMAL HIGH (ref 4.0–10.5)

## 2015-02-10 LAB — GLUCOSE, CAPILLARY
GLUCOSE-CAPILLARY: 116 mg/dL — AB (ref 70–99)
GLUCOSE-CAPILLARY: 131 mg/dL — AB (ref 70–99)
GLUCOSE-CAPILLARY: 142 mg/dL — AB (ref 70–99)
GLUCOSE-CAPILLARY: 147 mg/dL — AB (ref 70–99)
Glucose-Capillary: 166 mg/dL — ABNORMAL HIGH (ref 70–99)

## 2015-02-10 LAB — POCT I-STAT 3, ART BLOOD GAS (G3+)
Bicarbonate: 24.8 mEq/L — ABNORMAL HIGH (ref 20.0–24.0)
O2 Saturation: 100 %
Patient temperature: 99.5
TCO2: 26 mmol/L (ref 0–100)
pCO2 arterial: 39 mmHg (ref 35.0–45.0)
pH, Arterial: 7.413 (ref 7.350–7.450)
pO2, Arterial: 334 mmHg — ABNORMAL HIGH (ref 80.0–100.0)

## 2015-02-10 LAB — BASIC METABOLIC PANEL
Anion gap: 11 (ref 5–15)
Anion gap: 10 (ref 5–15)
BUN: 13 mg/dL (ref 6–20)
BUN: 14 mg/dL (ref 6–20)
CALCIUM: 8.1 mg/dL — AB (ref 8.9–10.3)
CO2: 22 mmol/L (ref 22–32)
CO2: 24 mmol/L (ref 22–32)
Calcium: 7.8 mg/dL — ABNORMAL LOW (ref 8.9–10.3)
Chloride: 98 mmol/L — ABNORMAL LOW (ref 101–111)
Chloride: 98 mmol/L — ABNORMAL LOW (ref 101–111)
Creatinine, Ser: 0.84 mg/dL (ref 0.61–1.24)
Creatinine, Ser: 0.96 mg/dL (ref 0.61–1.24)
GFR calc Af Amer: >60 mL/min (ref >60)
GFR calc Af Amer: >60 mL/min (ref >60)
Glucose, Bld: 170 mg/dL — ABNORMAL HIGH (ref 70–99)
Glucose, Bld: 141 mg/dL — ABNORMAL HIGH (ref 70–99)
POTASSIUM: 2.8 mmol/L — AB (ref 3.5–5.1)
Potassium: 3.2 mmol/L — ABNORMAL LOW (ref 3.5–5.1)
SODIUM: 131 mmol/L — AB (ref 135–145)
SODIUM: 132 mmol/L — AB (ref 135–145)

## 2015-02-10 LAB — HEMOGLOBIN A1C
Hgb A1c MFr Bld: 6 % — ABNORMAL HIGH (ref 4.8–5.6)
Mean Plasma Glucose: 126 mg/dL

## 2015-02-10 LAB — SODIUM
SODIUM: 133 mmol/L — AB (ref 135–145)
SODIUM: 136 mmol/L (ref 135–145)

## 2015-02-10 LAB — CREATININE, URINE, RANDOM: Creatinine, Urine: 42.92 mg/dL

## 2015-02-10 LAB — OSMOLALITY, URINE: Osmolality, Ur: 388 mOsm/kg — ABNORMAL LOW (ref 390–1090)

## 2015-02-10 LAB — SODIUM, URINE, RANDOM: Sodium, Ur: 103 mmol/L

## 2015-02-10 MED ORDER — MIDAZOLAM HCL 2 MG/2ML IJ SOLN
INTRAMUSCULAR | Status: AC
  Filled 2015-02-10: qty 4

## 2015-02-10 MED ORDER — LISINOPRIL 20 MG PO TABS
20.0000 mg | ORAL_TABLET | Freq: Every day | ORAL | Status: DC
  Administered 2015-02-10: 20 mg via ORAL
  Filled 2015-02-10 (×2): qty 1

## 2015-02-10 MED ORDER — PROPOFOL 1000 MG/100ML IV EMUL
5.0000 ug/kg/min | INTRAVENOUS | Status: DC
  Administered 2015-02-10: 18 ug/kg/min via INTRAVENOUS
  Administered 2015-02-10: 5 ug/kg/min via INTRAVENOUS
  Administered 2015-02-11 (×2): 20 ug/kg/min via INTRAVENOUS
  Administered 2015-02-11: 10 ug/kg/min via INTRAVENOUS
  Administered 2015-02-12 – 2015-02-13 (×3): 30 ug/kg/min via INTRAVENOUS
  Administered 2015-02-13: 25 ug/kg/min via INTRAVENOUS
  Administered 2015-02-13 – 2015-02-14 (×4): 30 ug/kg/min via INTRAVENOUS
  Filled 2015-02-10 (×16): qty 100

## 2015-02-10 MED ORDER — POTASSIUM CHLORIDE 20 MEQ/15ML (10%) PO SOLN
40.0000 meq | Freq: Three times a day (TID) | ORAL | Status: AC
  Administered 2015-02-10 (×2): 40 meq
  Filled 2015-02-10 (×2): qty 30

## 2015-02-10 MED ORDER — SODIUM CHLORIDE 3 % IV SOLN
INTRAVENOUS | Status: DC
  Administered 2015-02-10 – 2015-02-12 (×5): 50 mL/h via INTRAVENOUS
  Filled 2015-02-10 (×9): qty 500

## 2015-02-10 MED ORDER — CETYLPYRIDINIUM CHLORIDE 0.05 % MT LIQD
7.0000 mL | Freq: Four times a day (QID) | OROMUCOSAL | Status: DC
  Administered 2015-02-11 – 2015-02-21 (×40): 7 mL via OROMUCOSAL

## 2015-02-10 MED ORDER — FENTANYL CITRATE (PF) 100 MCG/2ML IJ SOLN
INTRAMUSCULAR | Status: AC
  Filled 2015-02-10: qty 2

## 2015-02-10 MED ORDER — ADULT MULTIVITAMIN LIQUID CH
5.0000 mL | Freq: Every day | ORAL | Status: DC
  Administered 2015-02-10 – 2015-02-14 (×5): 5 mL
  Filled 2015-02-10 (×10): qty 5

## 2015-02-10 MED ORDER — ROCURONIUM BROMIDE 50 MG/5ML IV SOLN
50.0000 mg | Freq: Once | INTRAVENOUS | Status: AC
  Administered 2015-02-10: 50 mg via INTRAVENOUS
  Filled 2015-02-10: qty 5

## 2015-02-10 MED ORDER — FENTANYL CITRATE (PF) 100 MCG/2ML IJ SOLN
100.0000 ug | Freq: Once | INTRAMUSCULAR | Status: AC
  Administered 2015-02-10: 100 ug via INTRAVENOUS

## 2015-02-10 MED ORDER — AMLODIPINE BESYLATE 10 MG PO TABS
10.0000 mg | ORAL_TABLET | Freq: Every day | ORAL | Status: DC
  Administered 2015-02-10: 10 mg via ORAL
  Filled 2015-02-10 (×2): qty 1

## 2015-02-10 MED ORDER — VITAL HIGH PROTEIN PO LIQD
1000.0000 mL | ORAL | Status: DC
Start: 2015-02-10 — End: 2015-02-11
  Administered 2015-02-10: 1000 mL
  Filled 2015-02-10 (×3): qty 1000

## 2015-02-10 MED ORDER — INSULIN ASPART 100 UNIT/ML ~~LOC~~ SOLN: 0.0000 [IU] | SUBCUTANEOUS | Status: DC | PRN

## 2015-02-10 MED ORDER — MIDAZOLAM HCL 2 MG/2ML IJ SOLN
2.0000 mg | Freq: Once | INTRAMUSCULAR | Status: AC
  Administered 2015-02-10: 2 mg via INTRAVENOUS

## 2015-02-10 MED ORDER — INSULIN ASPART 100 UNIT/ML ~~LOC~~ SOLN
0.0000 [IU] | SUBCUTANEOUS | Status: DC
  Administered 2015-02-11 – 2015-02-16 (×8): 2 [IU] via SUBCUTANEOUS

## 2015-02-10 MED ORDER — PRO-STAT SUGAR FREE PO LIQD
30.0000 mL | Freq: Three times a day (TID) | ORAL | Status: DC
  Administered 2015-02-10 – 2015-02-11 (×3): 30 mL
  Filled 2015-02-10 (×6): qty 30

## 2015-02-10 MED ORDER — CHLORHEXIDINE GLUCONATE 0.12 % MT SOLN
15.0000 mL | Freq: Two times a day (BID) | OROMUCOSAL | Status: DC
  Administered 2015-02-10 – 2015-02-21 (×21): 15 mL via OROMUCOSAL
  Filled 2015-02-10 (×20): qty 15

## 2015-02-10 MED ORDER — FENTANYL CITRATE (PF) 100 MCG/2ML IJ SOLN
25.0000 ug | INTRAMUSCULAR | Status: DC | PRN
  Administered 2015-02-11 – 2015-02-12 (×5): 100 ug via INTRAVENOUS
  Administered 2015-02-15 (×2): 50 ug via INTRAVENOUS
  Administered 2015-02-15: 100 ug via INTRAVENOUS
  Administered 2015-02-16: 50 ug via INTRAVENOUS
  Administered 2015-02-16: 25 ug via INTRAVENOUS
  Administered 2015-02-16: 50 ug via INTRAVENOUS
  Filled 2015-02-10 (×12): qty 2

## 2015-02-10 MED ORDER — ETOMIDATE 2 MG/ML IV SOLN
20.0000 mg | Freq: Once | INTRAVENOUS | Status: AC
  Administered 2015-02-10: 20 mg via INTRAVENOUS

## 2015-02-10 MED ORDER — FUROSEMIDE 10 MG/ML IJ SOLN
40.0000 mg | Freq: Once | INTRAMUSCULAR | Status: AC
  Administered 2015-02-10: 40 mg via INTRAVENOUS
  Filled 2015-02-10: qty 4

## 2015-02-10 NOTE — Progress Notes (Signed)
PT Cancellation Note  Patient Details Name: Austin Sawyer MRN: [id number] DOB: 09/01/1935   Cancelled Treatment:    Reason Eval/Treat Not Completed: Patient not medically ready.  Pt presently being intubated and sedated. 02/10/2015  [place] BingKen Timarion Agcaoili, PT [phone]  (pager)   Analysa Nutting, [name] 02/10/2015, 10:55 AM

## 2015-02-10 NOTE — Progress Notes (Signed)
STROKE TEAM PROGRESS NOTE   SUBJECTIVE (INTERVAL HISTORY) His wife and son are at the bedside.  Overall he feels his condition is deteriorated. He is not talking anymore, more lethargic and drowsy, breathing heavily, and sodium down to 131 from 143. Blood pressure difficult to control around 160, currently maximized on claviprex. Discussed with wife and son, patient likely need to be intubated and put on 3% saline and initiate TF feeding and po BP meds.    OBJECTIVE Temp:  [97.9 F (36.6 C)-99.5 F (37.5 C)] 99.3 F (37.4 C) (05/09 1136) Pulse Rate:  [65-101] 75 (05/09 1400) Cardiac Rhythm:  [-] Normal sinus rhythm (05/09 1400) Resp:  [12-39] 18 (05/09 1400) BP: (92-179)/(61-133) 128/66 mmHg (05/09 1400) SpO2:  [93 %-100 %] 99 % (05/09 1400) FiO2 (%):  [50 %-100 %] 50 % (05/09 1400) Weight:  [224 lb 3.3 oz (101.7 kg)] 224 lb 3.3 oz (101.7 kg) (05/09 1230)   Recent Labs Lab 02/08/15 1752 02/09/15 1615 02/09/15 2141 02/10/15 0807 02/10/15 1134  GLUCAP 112* 136* 159* 166* 142*    Recent Labs Lab 02/08/15 1823 02/08/15 1827 02/09/15 0745 02/10/15 0227 02/10/15 1055  NA 140 143 134* 131* 132*  K 3.7 3.6 3.3* 3.2* 2.8*  CL 108 104 97* 98* 98*  CO2 26  --  GLUCOSE 111* 110* 178* 170* 141*  BUN CREATININE 0.99 1.00 0.90 0.84 0.96  CALCIUM 8.3*  --  8.6* 8.1* 7.8*    Recent Labs Lab 02/08/15 1823  AST 28  ALT 25  ALKPHOS 54  BILITOT 0.6  PROT 6.8  ALBUMIN 3.7    Recent Labs Lab 02/08/15 1823 02/08/15 1827 02/09/15 0745 02/10/15 0227  WBC 10.2  --  18.4* 23.7*  NEUTROABS 7.3  --   --   --   HGB 13.0 14.6 14.3 15.6  HCT 39.8 43.0 42.6 44.6  MCV 89.0  --  86.6 83.8  PLT 166  --  180 180   No results for input(s): CKTOTAL, CKMB, CKMBINDEX, TROPONINI in the last 168 hours.  Recent Labs  02/08/15 1823  LABPROT 14.0  INR 1.07    Recent Labs  02/08/15 1823  COLORURINE YELLOW  LABSPEC 1.016  PHURINE 6.5  GLUCOSEU NEGATIVE   HGBUR NEGATIVE  BILIRUBINUR NEGATIVE  KETONESUR NEGATIVE  PROTEINUR 100*  UROBILINOGEN 0.2  NITRITE NEGATIVE  LEUKOCYTESUR NEGATIVE       Component Value Date/Time   CHOL 178 02/09/2015 0745   TRIG 47 02/09/2015 0745   HDL 62 02/09/2015 0745   CHOLHDL 2.9 02/09/2015 0745   VLDL 9 02/09/2015 0745   LDLCALC 107* 02/09/2015 0745   Lab Results  Component Value Date   HGBA1C 6.0* 02/09/2015      Component Value Date/Time   LABOPIA NONE DETECTED 02/08/2015 1823   COCAINSCRNUR NONE DETECTED 02/08/2015 1823   LABBENZ NONE DETECTED 02/08/2015 1823   AMPHETMU NONE DETECTED 02/08/2015 1823   THCU NONE DETECTED 02/08/2015 1823   LABBARB NONE DETECTED 02/08/2015 1823     Recent Labs Lab 02/08/15 1826  ETH <5    I have personally reviewed the radiological images below and agree with the radiology interpretations.  Ct Head Wo Contrast 02/10/2015 - 1. No significant interval change in size of left occipital lobe intraparenchymal hematoma with slightly increased associated vasogenic edema as compared to previous. Similar trace left-to-right shift. 2. Stable to slightly decreased conspicuity of bilateral small volume subarachnoid hemorrhage. 3. Stable  intraventricular hemorrhage. No hydrocephalus. 4. Persistent left subdural hematoma measuring up to 3.5 mm in maximal diameter. Blood along the left tentorium is decreased in conspicuity. 5. No new intracranial abnormality.  02/09/2015   IMPRESSION: 1. Slight enlargement of large left occipital lobe intra-axial hemorrhage. Estimated hemorrhage volume now 58 mm. Stable left posterior hemisphere edema and mass effect. 2. Mild progression of subarachnoid extension, now bilateral. Extension and of a left subdural space with small broad-based left subdural hematoma not significantly changed. Stable intraventricular hemorrhage volume. No ventriculomegaly. 3. No new intracranial abnormality identified.      02/08/2015   IMPRESSION: 1. 6.7 cm left  occipital parenchymal hemorrhage with mild surrounding edema. No midline shift. 2. Small amount of adjacent subarachnoid hemorrhage and small amount of intraventricular extension of blood. 3. Small subdural hematomas over the left cerebral convexity and left tentorium.   Dg Chest Port 1 View 02/10/15 Tube and catheter positions as described without pneumothorax. No edema or consolidation. 02/09/2015    IMPRESSION: No acute cardiopulmonary disease.     Carotid Doppler  pending  2D Echocardiogram  Pending  EEG This EEG is abnormal with moderately severe generalized continuous nonspecific slowing of cerebral activity. No evidence of epileptic activity was recorded. The absence of epileptiform activity during an EEG recording does not, in and of itself, rule out seizure disorder, however.  EKG  NSR  PHYSICAL EXAM  Temp:  [97.9 F (36.6 C)-99.5 F (37.5 C)] 99.3 F (37.4 C) (05/09 1136) Pulse Rate:  [65-101] 75 (05/09 1400) Resp:  [12-39] 18 (05/09 1400) BP: (92-179)/(61-133) 128/66 mmHg (05/09 1400) SpO2:  [93 %-100 %] 99 % (05/09 1400) FiO2 (%):  [50 %-100 %] 50 % (05/09 1400) Weight:  [224 lb 3.3 oz (101.7 kg)] 224 lb 3.3 oz (101.7 kg) (05/09 1230)  General - Well nourished, well developed, drowsy, open eyes on voice but back to sleep without constant stimulation, breathing heavily.  Ophthalmologic - fundi not visualized due to incorporation.  Cardiovascular - Regular rate and rhythm.  Neuro -  Drowsy and lethargic, open eyes on stimulation but not able to keep eyes open without constant stimulation, global aphasia, not following commands. Neglect to the right, not blinking to visual threat to the right. left gaze preference. Right nasolabial fold flattening. RUE drifting, 3-/5, LUE spontaneous movement 4+/5. RLE also weaker than LLE, however difficult to precisely evaluate due to not following commands. Reflexes were 1+ in all extremities and he had no pathological  reflexes.  ASSESSMENT/PLAN [name] is a 80 y.o. male with history of HTN, DM, CAD, a flutter on Coumadin before until 2010, alcohol abuse admitted for headache, nausea vomiting, confusion, blurred vision. CT had showed left occipital ICH with small SAH, SDH and IVH. Symptoms and changed.    Left occipital ICH with small SAH, SDH and IVH - etiology unclear, HTN versus CAA  MRI  not tolerating  MRA  not tolerating  Repeat CT showed stable hematoma with slight extension of SAH and SDH, stable IVH  Carotid Doppler  pending  2D Echo  Pending  EEG no seizure  LDL 107, not at goal  HgbA1c 6.0  SCDs for VTE prophylaxis following for a long as he is a going  Diet NPO time specified   no antithrombotic prior to admission, now on no antithrombotic due to ICH  Ongoing aggressive stroke risk factor management  Therapy recommendations:  Pending  Disposition:  Pending  Respiratory failure  CCM consulted  Consider intubation  Family  in agreement  CXR did not show CHF  Hyponatremia  Concerning for SIADH vs. salt wasting  Sodium from 143 to 131  Check urine sodium, creatinine and osmolarity  CCM consulted for central line  Consider 3% saline  1 dose Lasix given  Diabetes  HbA1c 6.0, goal < 7.0  Controlled  CBG monitoring  SSI  Hypertension  Home meds:  Coreg, losartan, BP goal < 160 Currently on cleviprex drip, maximized  Will put on PO BP meds after intubation   Hyperlipidemia  Home meds:   Vytorin  Currently on no med due to NPO  LDL 107, goal < 70  Resume home meds once passed swallow  Continue statin at discharge  A. flutter  Patient history of a flutter  Was on Coumadin in the past  no anticoagulation or antiplatelet PTA  No a flutter on telemetry  Patient may not be a candidate for anticoagulation  Other Stroke Risk Factors  Advanced age  ETOH use  Obesity, Body mass index is 36.21 kg/(m^2).   Other Active  Problems  Mild hypokalemia  Leukocytosis  Other Pertinent History    Hospital day # 2  This patient is critically ill due to respiratory failure, large left occipital ICH with SAH and IVH, hyponatremia and at significant risk of neurological worsening, death form respiratory failure, rebleeding, cerebral edema, brain herniation, and status epilepticus. This patient's care requires constant monitoring of vital signs, hemodynamics, respiratory and cardiac monitoring, review of multiple databases, neurological assessment, discussion with family, other specialists and medical decision making of high complexity. I spent 50 minutes of neurocritical care time in the care of this patient.  Marvel PlanJindong Amaia Lavallie, MD PhD Stroke Neurology 02/10/2015 2:25 PM    To contact Stroke Continuity provider, please refer to [email or web address]. After hours, contact General Neurology

## 2015-02-10 NOTE — Progress Notes (Signed)
eLink Physician-Brief Progress Note Patient Name: Candise CheLeon Sherard DOB: 12/30/1934 MRN: [id number]   Date of Service  02/10/2015  HPI/Events of Note    eICU Interventions  Change SSI to q4h     Intervention Category Minor Interventions: Routine modifications to care plan (e.g. PRN medications for pain, fever)  MCQUAID, DOUGLAS 02/10/2015, 8:46 PM

## 2015-02-10 NOTE — Progress Notes (Signed)
Stat EEG delayed due to patient condition.  Pt is being intubated.  Dr. Roda ShuttersXu aware.

## 2015-02-10 NOTE — Procedures (Signed)
Intubation Procedure Note Austin Sawyer [id number] 07/06/1935  Procedure: Intubation Indications: Airway protection and maintenance  Procedure Details Consent: Unable to obtain consent because of altered level of consciousness. Time Out: Verified patient identification, verified procedure, site/side was marked, verified correct patient position, special equipment/implants available, medications/allergies/relevent history reviewed, required imaging and test results available.  Performed  Maximum sterile technique was used including antiseptics, gloves, hand hygiene and mask.  MAC    Evaluation Hemodynamic Status: BP stable throughout; O2 sats: stable throughout Patient's Current Condition: stable Complications: No apparent complications Patient did tolerate procedure well. Chest X-ray ordered to verify placement.  CXR: pending.   Austin Sawyer 02/10/2015

## 2015-02-10 NOTE — Procedures (Signed)
Central Venous Catheter Insertion Procedure Note Austin Sawyer [id number] 03/22/1935  Procedure: Insertion of Central Venous Catheter Indications: Assessment of intravascular volume, Drug and/or fluid administration and Frequent blood sampling  Procedure Details Consent: Risks of procedure as well as the alternatives and risks of each were explained to the (patient/caregiver).  Consent for procedure obtained. Time Out: Verified patient identification, verified procedure, site/side was marked, verified correct patient position, special equipment/implants available, medications/allergies/relevent history reviewed, required imaging and test results available.  Performed  Maximum sterile technique was used including antiseptics, cap, gloves, gown, hand hygiene, mask and sheet. Skin prep: Chlorhexidine; local anesthetic administered A antimicrobial bonded/coated triple lumen catheter was placed in the left internal jugular vein using the Seldinger technique.  Evaluation Blood flow good Complications: No apparent complications Patient did tolerate procedure well. Chest X-ray ordered to verify placement.  CXR: pending.  U/S used in placement.  YACOUB,WESAM 02/10/2015, 10:16 AM

## 2015-02-10 NOTE — Consult Note (Signed)
PULMONARY / CRITICAL CARE MEDICINE   Name: Austin Sawyer MRN: [id number] DOB: Nov 13, 1934    ADMISSION DATE:  02/08/2015 CONSULTATION DATE:  02/10/2015  REFERRING MD :  Neurology  CHIEF COMPLAINT:  ICH, headache and vomiting  INITIAL PRESENTATION: 80 year old male with PMH of HTN presenting with ICH on 5/7.  On 5/9 patient decompensated and was unable to protect his airway.  PCCM was called on consultation for intubation and TLC placement for 3% saline.  STUDIES:  5/9 repeat head CT with edema and ICH that was stable.  SIGNIFICANT EVENTS: 5/7 ICH on CT and AMS. 5/9 Unable to protect his airway and PCCM consulted.   HISTORY OF PRESENT ILLNESS:  80 year old male with PMH of HTN presenting with ICH on 5/7.  On 5/9 patient decompensated and was unable to protect his airway.  PCCM was called on consultation for intubation and TLC placement for 3% saline.  PAST MEDICAL HISTORY :   has a past medical history of Hypertension; Coronary artery disease; Diabetes mellitus without complication; MI, old; Reflux; Depression; Cervicalgia; Disturbance of skin sensation; Coronary atherosclerosis of unspecified type of vessel, native or graft; Unspecified hearing loss; Unspecified sleep apnea; Alcohol abuse, in remission; and Atrial flutter.  has past surgical history that includes Hernia repair and Rotator cuff repair. Prior to Admission medications   Medication Sig Start Date End Date Taking? Authorizing Provider  allopurinol (ZYLOPRIM) 300 MG tablet Take 300 mg by mouth daily.   Yes Historical Provider, MD  carvedilol (COREG) 25 MG tablet Take 37.5 mg by mouth 2 (two) times daily with a meal.   Yes Historical Provider, MD  colchicine 0.6 MG tablet Take 1 tablet (0.6 mg total) by mouth daily. 05/01/13  Yes Charm Rings, NP  DULoxetine (CYMBALTA) 20 MG capsule Take 40 mg by mouth daily.   Yes Historical Provider, MD  gabapentin (NEURONTIN) 300 MG capsule Take 300 mg by mouth daily.   Yes Historical Provider,  MD  losartan (COZAAR) 100 MG tablet Take 100 mg by mouth daily.   Yes Historical Provider, MD  metFORMIN (GLUCOPHAGE-XR) 500 MG 24 hr tablet Take 1 tablet (500 mg total) by mouth daily with breakfast. 05/01/13  Yes Charm Rings, NP  omeprazole (PRILOSEC) 20 MG capsule Take 20 mg by mouth daily.   Yes Historical Provider, MD  valsartan-hydrochlorothiazide (DIOVAN-HCT) 160-12.5 MG per tablet Take 1 tablet by mouth daily.   Yes Historical Provider, MD  darifenacin (ENABLEX) 7.5 MG 24 hr tablet Take 1 tablet (7.5 mg total) by mouth daily. Patient not taking: Reported on 11/20/2014 05/01/13   Charm Rings, NP  DULoxetine (CYMBALTA) 60 MG capsule Take 1 capsule (60 mg total) by mouth daily. Patient not taking: Reported on 11/20/2014 09/14/13   Levert Feinstein, MD  ezetimibe-simvastatin (VYTORIN) 10-80 MG per tablet Take 1 tablet by mouth at bedtime. Patient not taking: Reported on 11/20/2014 05/01/13   Charm Rings, NP  hydrOXYzine (VISTARIL) 50 MG capsule Take 1 capsule (50 mg total) by mouth at bedtime. Patient not taking: Reported on 11/20/2014 05/01/13   Charm Rings, NP  mirtazapine (REMERON) 15 MG tablet Take 1 tablet (15 mg total) by mouth at bedtime as needed (insomia). Patient not taking: Reported on 11/20/2014 05/01/13   Charm Rings, NP  nitroGLYCERIN (NITROSTAT) 0.4 MG SL tablet Place 0.4 mg under the tongue every 5 (five) minutes as needed. Chest pain    Historical Provider, MD  solifenacin (VESICARE) 5 MG tablet Take  2 tablets (10 mg total) by mouth daily. Patient not taking: Reported on 11/20/2014 05/01/13   Charm RingsJamison Y Lord, NP  tamsulosin (FLOMAX) 0.4 MG CAPS Take 1 capsule (0.4 mg total) by mouth daily. Patient not taking: Reported on 11/20/2014 05/01/13   Charm RingsJamison Y Lord, NP  telmisartan-hydrochlorothiazide (MICARDIS HCT) 80-25 MG per tablet Take 1 tablet by mouth daily. Patient not taking: Reported on 11/20/2014 05/01/13   Charm RingsJamison Y Lord, NP   No Known Allergies  FAMILY HISTORY:  indicated  that his mother is deceased. He reported the following about his father: did not no father.  SOCIAL HISTORY:  reports that he has quit smoking. He has never used smokeless tobacco. He reports that he does not drink alcohol or use illicit drugs.  REVIEW OF SYSTEMS:  Unattainable, patient is aphasic.  SUBJECTIVE:   VITAL SIGNS: Temp:  [97.9 F (36.6 C)-99.5 F (37.5 C)] 98.3 F (36.8 C) (05/09 0400) Pulse Rate:  [54-104] 89 (05/09 0800) Resp:  [11-39] 20 (05/09 0800) BP: (92-181)/(61-133) 166/61 mmHg (05/09 0800) SpO2:  [93 %-100 %] 96 % (05/09 0800) HEMODYNAMICS:   VENTILATOR SETTINGS:   INTAKE / OUTPUT:  Intake/Output Summary (Last 24 hours) at 02/10/15 0952 Last data filed at 02/10/15 0800  Gross per 24 hour  Intake 2666.24 ml  Output      0 ml  Net 2666.24 ml    PHYSICAL EXAMINATION: General:  Acutely ill appearing, NAD, gurgling. Neuro:  Awake, tracks but not protecting airway, moving all ext to pain. HEENT:  [place]/AT, PERRL, EOM-I and MMM. Cardiovascular:  RRR, Nl S1/S2, -M/R/G. Lungs:  Transmitted upper airway sounds. Abdomen:  Soft, NT, ND and +BS. Musculoskeletal:  Withdraws to pain but not commands. Skin:  Intact.  LABS:  CBC  Recent Labs Lab 02/08/15 1823 02/08/15 1827 02/09/15 0745 02/10/15 0227  WBC 10.2  --  18.4* 23.7*  HGB 13.0 14.6 14.3 15.6  HCT 39.8 43.0 42.6 44.6  PLT 166  --  180 180   Coag's  Recent Labs Lab 02/08/15 1823  APTT 27  INR 1.07   BMET  Recent Labs Lab 02/08/15 1823 02/08/15 1827 02/09/15 0745 02/10/15 0227  NA 140 143 134* 131*  K 3.7 3.6 3.3* 3.2*  CL 108 104 97* 98*  CO2 26  --  22 22  BUN 14 12 10 13   CREATININE 0.99 1.00 0.90 0.84  GLUCOSE 111* 110* 178* 170*   Electrolytes  Recent Labs Lab 02/08/15 1823 02/09/15 0745 02/10/15 0227  CALCIUM 8.3* 8.6* 8.1*   Sepsis Markers No results for input(s): LATICACIDVEN, PROCALCITON, O2SATVEN in the last 168 hours. ABG No results for input(s): PHART,  PCO2ART, PO2ART in the last 168 hours. Liver Enzymes  Recent Labs Lab 02/08/15 1823  AST 28  ALT 25  ALKPHOS 54  BILITOT 0.6  ALBUMIN 3.7   Cardiac Enzymes No results for input(s): TROPONINI, PROBNP in the last 168 hours. Glucose  Recent Labs Lab 02/08/15 1752 02/09/15 1615 02/09/15 2141 02/10/15 0807  GLUCAP 112* 136* 159* 166*    Imaging Ct Head Wo Contrast  02/09/2015   CLINICAL DATA:  80 year old male with left occipital intra-axial hemorrhage detected on CT following code stroke presentation. Initial encounter.  EXAM: CT HEAD WITHOUT CONTRAST  TECHNIQUE: Contiguous axial images were obtained from the base of the skull through the vertex without intravenous contrast.  COMPARISON:  Head CT 02/08/2015.  FINDINGS: Stable and largely clear paranasal sinuses and mastoids. No acute osseous abnormality identified. Visualized orbits  and scalp soft tissues are within normal limits. Calcified atherosclerosis at the skull base.  Large left occipital lobe hyperdense intra-axial hemorrhage re- identified encompassing 71 x 36 mm AP by transverse (versus 68 x 36 mm at a comparable level previously). Estimated intra-axial hemorrhage volume of 58 mL.  Intraventricular and extra-axial extension Re identified. Broad-based left subdural hematoma measures up to 3 mm in thickness, not significantly changed. Some redistribution now with blood layering on the left tentorium. Small volume bilateral posterior superior convexity subarachnoid hemorrhage has increased. Intraventricular hemorrhage volume has Re distributed but not significantly changed, now with hemorrhage layering in both occipital horns. No third or fourth ventricular hemorrhage.  Basilar cisterns remain patent. No ventriculomegaly. No new cortically based infarct. Posterior left hemisphere edema and regional mass effect is stable. Trace rightward midline shift.  IMPRESSION: 1. Slight enlargement of large left occipital lobe intra-axial  hemorrhage. Estimated hemorrhage volume now 58 mm. Stable left posterior hemisphere edema and mass effect. 2. Mild progression of subarachnoid extension, now bilateral. Extension and of a left subdural space with small broad-based left subdural hematoma not significantly changed. Stable intraventricular hemorrhage volume. No ventriculomegaly. 3. No new intracranial abnormality identified.   Electronically Signed   By: Odessa Fleming M.D.   On: 02/09/2015 10:41   Dg Chest Port 1 View  02/09/2015   CLINICAL DATA:  Congestive heart failure.  EXAM: PORTABLE CHEST - 1 VIEW  COMPARISON:  11/20/2014  FINDINGS: Cardiac silhouette is mildly enlarged. No mediastinal or hilar masses. Clear lungs. No pleural effusion pneumothorax. Bony thorax is grossly intact.  IMPRESSION: No acute cardiopulmonary disease.   Electronically Signed   By: Amie Portland M.D.   On: 02/09/2015 10:08     ASSESSMENT / PLAN:  PULMONARY OETT 5/9>>> A: VDRF due to inability to protect his airways. P:   - Intubate. - Full vent support. - Titrate O2 for sat of 88-92%. - F/U CXR and ABG.  CARDIOVASCULAR CVL L IJ TLC 5/9>>> A: BP stable.  Will need TLC for 3% saline. P:  - Place TLC. - F/U CVP for volume status.  RENAL A:  Hyponatremia, hypokalemia and hypochloremia. P:   - 3% saline. - Replace K. - BMET in AM. - Replace electrolytes as indicated.  GASTROINTESTINAL A:  No active issues. P:   - PPI. - Consult nutrition for TF. - Insert OGT.  HEMATOLOGIC A:  No active issues. P:  - CBC in AM. - Transfusion per ICU protocol.  INFECTIOUS A:  WBC elevated but no signs of infection. P:   - Hold off cultures and abx for now. - If febrile will consider broad spectrum abx.  ENDOCRINE A:  No diabetes by history.   P:   - Monitor.  NEUROLOGIC A:  ICH, unable to protect his airway. P:   RASS goal: 0 - 3% saline. - Propofol for sedation. - PRN fentanyl. - Neurology following and to manage other issues.  FAMILY  -  Updates: No family bedside.   TODAY'S SUMMARY: 80 year old, ICH, unable to protect airway, needs 3% saline, will intubate, mechanically ventilate, place TLC for 3%.  The patient is critically ill with multiple organ systems failure and requires high complexity decision making for assessment and support, frequent evaluation and titration of therapies, application of advanced monitoring technologies and extensive interpretation of multiple databases.   Critical Care Time devoted to patient care services described in this note is  35  Minutes. This time reflects time of care of  this signee Dr Koren BoundWesam Atavia Poppe. This critical care time does not reflect procedure time, or teaching time or supervisory time of PA/NP/Med student/Med Resident etc but could involve care discussion time.  Alyson ReedyWesam G. Rhett Mutschler, M.D. [facility]. Pager: [phone]. After hours pager: [phone].   02/10/2015, 9:52 AM

## 2015-02-10 NOTE — Progress Notes (Signed)
OT Cancellation Note  Patient Details Name: Austin Sawyer MRN: [id number] DOB: 10/08/1934   Cancelled Treatment:    Reason Eval/Treat Not Completed: Patient not medically ready - Pt intubated and sedated.   Angelene GiovanniConarpe, Monico Sudduth M  Bryndon Cumbie Dresdenonarpe, OTR/L [phone]  02/10/2015, 10:57 AM

## 2015-02-10 NOTE — Procedures (Signed)
ELECTROENCEPHALOGRAM REPORT  Patient: Austin Sawyer       Room #: 1O103M11 EEG No. ID: [id number] Age: [age] y.o.        Sex: male Referring Physician: Jerel ShepherdXU, J Report Date:  02/10/2015        Interpreting Physician: Aline BrochureSTEWART,Mckinsley Koelzer R  History: Austin Sawyer is an 80 y.o. male left occipital hemorrhage as well as a history of hypertension, coronary artery disease and diabetes mellitus. Patient has been intubated and on mechanical ventilation. He also has history of alcohol abuse, in remission.  Indications for study:  Assess severity of encephalopathy; rule out seizure activity.  Technique: This is an 18 channel routine scalp EEG performed at the bedside with bipolar and monopolar montages arranged in accordance to the international 10/20 system of electrode placement.   Description: Patient was intubated and on mechanical ventilation. Sedating medications were held during this EEG recording. Predominant background activity consisted of low amplitude diffuse irregular delta activity with superimposed 6-7 Hz low to moderate amplitude theta activity diffusely. Photic stimulation and hyperventilation were not performed. No epileptiform discharges were recorded.  Interpretation: This EEG is abnormal with moderately severe generalized continuous nonspecific slowing of cerebral activity. No evidence of epileptic activity was recorded. The absence of epileptiform activity during an EEG recording does not, in and of itself, rule out seizure disorder, however.   Venetia MaxonR Dayyan Krist M.D. [facility] [phone]

## 2015-02-10 NOTE — Progress Notes (Signed)
EEG completed, results pending. 

## 2015-02-10 NOTE — Progress Notes (Signed)
SLP Cancellation Note  Patient Details Name: Candise CheLeon Renovato MRN: [id number] DOB: 12/12/1934   Cancelled treatment:        Pt not appropriate for swallow eval. Probable intubation today per RN. Will briefly follow for status.   Royce MacadamiaLitaker, Jamison Yuhasz Willis 02/10/2015, 9:17 AM  Breck CoonsLisa Willis Lonell FaceLitaker M.Ed [employer]SLP Pager [phone]

## 2015-02-10 NOTE — Progress Notes (Signed)
Initial Nutrition Assessment  DOCUMENTATION CODES:  Obesity unspecified  INTERVENTION:  Tube feeding  Initiate Vital High Protein @ 25 ml/hr via OG tube and increase by 10 ml every 4 hours to goal rate of 45 ml/hr.   30 ml Prostat TID.    Tube feeding regimen provides 1380 kcal (12.5 kcal/kg of ABW), 139 grams of protein, and 902 ml of H2O.   NUTRITION DIAGNOSIS:  Inadequate oral intake related to inability to eat as evidenced by NPO status.    GOAL:  Provide needs based on ASPEN/SCCM guidelines    MONITOR:  TF tolerance, Vent status, Labs  REASON FOR ASSESSMENT:  Consult Enteral/tube feeding initiation and management  ASSESSMENT: Pt admitted as a code stroke, ICH on CT.  Pt decompensated 5/9 and intubated. Asked to start nutrition.   Patient is currently intubated on ventilator support MV: 8 L/min Temp (24hrs), Avg:98.4 F (36.9 C), Min:97.9 F (36.6 C), Max:99.5 F (37.5 C)   Nutrition-Focused physical exam completed. Findings are no fat depletion, no muscle depletion, and no edema.  Pt discussed during ICU rounds and with RN.    Height:  Ht Readings from Last 1 Encounters:  02/08/15 5\' 6"  (1.676 m)    Weight:  Wt Readings from Last 1 Encounters:  02/08/15 224 lb 3.3 oz (101.7 kg)    Ideal Body Weight:  64.5 kg  Wt Readings from Last 10 Encounters:  02/08/15 224 lb 3.3 oz (101.7 kg)  03/15/14 227 lb (102.967 kg)  09/14/13 220 lb (99.791 kg)  07/12/13 217 lb (98.431 kg)  04/27/13 213 lb (96.616 kg)  04/22/13 220 lb (99.791 kg)  10/04/12 220 lb (99.791 kg)  06/23/12 219 lb (99.338 kg)  06/19/10 221 lb (100.245 kg)  12/09/09 240 lb (108.863 kg)    BMI:  Body mass index is 36.21 kg/(m^2).  Estimated Nutritional Needs:  Kcal:  [id number]  Protein:  >/= 129 grams  Fluid:  > 1.5 L/day  Skin:  Reviewed, no issues  Diet Order:  Diet NPO time specified  EDUCATION NEEDS:  No education needs identified at this  time   Intake/Output Summary (Last 24 hours) at 02/10/15 1129 Last data filed at 02/10/15 0800  Gross per 24 hour  Intake 2237.49 ml  Output      0 ml  Net 2237.49 ml    Last BM:  5/9  Kendell BaneHeather Neal Trulson RD, LDN, CNSC [phone] Pager [phone] After Hours Pager

## 2015-02-11 ENCOUNTER — Inpatient Hospital Stay (HOSPITAL_COMMUNITY): Payer: Medicare Other

## 2015-02-11 DIAGNOSIS — J9601 Acute respiratory failure with hypoxia: Secondary | ICD-10-CM

## 2015-02-11 DIAGNOSIS — I610 Nontraumatic intracerebral hemorrhage in hemisphere, subcortical: Secondary | ICD-10-CM

## 2015-02-11 DIAGNOSIS — J9811 Atelectasis: Secondary | ICD-10-CM

## 2015-02-11 LAB — BLOOD GAS, ARTERIAL
ACID-BASE DEFICIT: 0.5 mmol/L (ref 0.0–2.0)
BICARBONATE: 23.4 meq/L (ref 20.0–24.0)
DRAWN BY: 41308
FIO2: 0.5 %
MECHVT: 510 mL
O2 SAT: 97.9 %
PEEP/CPAP: 5 cmH2O
PH ART: 7.398 (ref 7.350–7.450)
PO2 ART: 126 mmHg — AB (ref 80.0–100.0)
Patient temperature: 101.2
RATE: 16 resp/min
TCO2: 24.6 mmol/L (ref 0–100)
pCO2 arterial: 39.5 mmHg (ref 35.0–45.0)

## 2015-02-11 LAB — CBC
HCT: 40.9 % (ref 39.0–52.0)
HEMOGLOBIN: 13.9 g/dL (ref 13.0–17.0)
MCH: 29.2 pg (ref 26.0–34.0)
MCHC: 34 g/dL (ref 30.0–36.0)
MCV: 85.9 fL (ref 78.0–100.0)
PLATELETS: 174 10*3/uL (ref 150–400)
RBC: 4.76 MIL/uL (ref 4.22–5.81)
RDW: 14.7 % (ref 11.5–15.5)
WBC: 18.1 10*3/uL — AB (ref 4.0–10.5)

## 2015-02-11 LAB — BASIC METABOLIC PANEL
ANION GAP: 8 (ref 5–15)
BUN: 20 mg/dL (ref 6–20)
CALCIUM: 7.6 mg/dL — AB (ref 8.9–10.3)
CO2: 24 mmol/L (ref 22–32)
CREATININE: 1.09 mg/dL (ref 0.61–1.24)
Chloride: 109 mmol/L (ref 101–111)
GFR calc non Af Amer: >60 mL/min (ref >60)
Glucose, Bld: 141 mg/dL — ABNORMAL HIGH (ref 70–99)
Potassium: 3.6 mmol/L (ref 3.5–5.1)
Sodium: 141 mmol/L (ref 135–145)

## 2015-02-11 LAB — GLUCOSE, CAPILLARY
GLUCOSE-CAPILLARY: 113 mg/dL — AB (ref 70–99)
GLUCOSE-CAPILLARY: 98 mg/dL (ref 70–99)
Glucose-Capillary: 131 mg/dL — ABNORMAL HIGH (ref 70–99)
Glucose-Capillary: 124 mg/dL — ABNORMAL HIGH (ref 70–99)
Glucose-Capillary: 116 mg/dL — ABNORMAL HIGH (ref 70–99)

## 2015-02-11 LAB — SODIUM
SODIUM: 142 mmol/L (ref 135–145)
SODIUM: 145 mmol/L (ref 135–145)
Sodium: 138 mmol/L (ref 135–145)
Sodium: 149 mmol/L — ABNORMAL HIGH (ref 135–145)

## 2015-02-11 LAB — PHOSPHORUS: Phosphorus: 2.5 mg/dL (ref 2.5–4.6)

## 2015-02-11 LAB — MAGNESIUM: MAGNESIUM: 1.8 mg/dL (ref 1.7–2.4)

## 2015-02-11 MED ORDER — PRO-STAT SUGAR FREE PO LIQD
60.0000 mL | Freq: Three times a day (TID) | ORAL | Status: DC
  Administered 2015-02-11 – 2015-02-14 (×9): 60 mL
  Filled 2015-02-11 (×20): qty 60

## 2015-02-11 MED ORDER — METOPROLOL TARTRATE 25 MG PO TABS
25.0000 mg | ORAL_TABLET | Freq: Two times a day (BID) | ORAL | Status: DC
  Administered 2015-02-11 – 2015-02-12 (×3): 25 mg via ORAL
  Filled 2015-02-11 (×4): qty 1

## 2015-02-11 MED ORDER — LISINOPRIL 20 MG PO TABS
20.0000 mg | ORAL_TABLET | Freq: Two times a day (BID) | ORAL | Status: DC
  Filled 2015-02-11 (×2): qty 1

## 2015-02-11 MED ORDER — VITAL HIGH PROTEIN PO LIQD
1000.0000 mL | ORAL | Status: DC
  Administered 2015-02-11 – 2015-02-13 (×3): 1000 mL
  Filled 2015-02-11 (×8): qty 1000

## 2015-02-11 MED ORDER — MAGNESIUM SULFATE 2 GM/50ML IV SOLN
2.0000 g | Freq: Once | INTRAVENOUS | Status: AC
  Administered 2015-02-11: 2 g via INTRAVENOUS

## 2015-02-11 MED ORDER — SODIUM CHLORIDE 0.9 % IV SOLN
3.0000 g | Freq: Four times a day (QID) | INTRAVENOUS | Status: DC
  Administered 2015-02-11 – 2015-02-17 (×25): 3 g via INTRAVENOUS
  Filled 2015-02-11 (×28): qty 3

## 2015-02-11 MED ORDER — MAGNESIUM SULFATE 50 % IJ SOLN
2.0000 g | Freq: Once | INTRAMUSCULAR | Status: DC
  Filled 2015-02-11: qty 4

## 2015-02-11 NOTE — Progress Notes (Signed)
SLP Cancellation Note  Patient Details Name: Austin Sawyer MRN: [id number] DOB: 06/30/1935   Cancelled treatment:       Reason Eval/Treat Not Completed: Medical issues which prohibited therapy - pt intubated. Will sign off at this time. Please re-order SLP when medically ready.   Austin Sawyer, M.A. CCC-SLP [phone]  Austin Hamaiewonsky, Austin Sawyer 02/11/2015, 10:37 AM

## 2015-02-11 NOTE — Progress Notes (Signed)
To MRI at 17:30, returned by 19:15. Accompanied by RN and RT cont sat and HR monitoring, intermittent BP, checked, WNL

## 2015-02-11 NOTE — Progress Notes (Signed)
STROKE TEAM PROGRESS NOTE   SUBJECTIVE (INTERVAL HISTORY) His wife and son and daughterare at the bedside. Austin Sawyer was intubated and sedated. Na trending up nicely, still on claviprex for BP control along with po meds. Moving all extremities.     OBJECTIVE Temp:  [99.9 F (37.7 C)-101.9 F (38.8 C)] 100.9 F (38.3 C) (05/10 0739) Pulse Rate:  [59-85] 59 (05/10 1230) Cardiac Rhythm:  [-] Normal sinus rhythm (05/10 0700) Resp:  [12-25] 14 (05/10 1230) BP: (116-171)/(62-81) 149/70 mmHg (05/10 1230) SpO2:  [97 %-100 %] 99 % (05/10 1230) FiO2 (%):  [40 %-50 %] 40 % (05/10 1125) Weight:  [222 lb 7.1 oz (100.9 kg)] 222 lb 7.1 oz (100.9 kg) (05/10 0450)   Recent Labs Lab 02/10/15 1934 02/10/15 2343 02/11/15 0341 02/11/15 0737 02/11/15 1213  GLUCAP 131* 147* 131* 124* 113*    Recent Labs Lab 02/08/15 1823 02/08/15 1827 02/09/15 0745 02/10/15 0227 02/10/15 1055 02/10/15 1535 02/10/15 2002 02/11/15 0209 02/11/15 0438 02/11/15 0835  NA 140 143 134* 131* 132* 133* 136 138 141 142  K 3.7 3.6 3.3* 3.2* 2.8*  --   --   --  3.6  --   CL 108 104 97* 98* 98*  --   --   --  109  --   CO2 26  --  22 22 24   --   --   --  24  --   GLUCOSE 111* 110* 178* 170* 141*  --   --   --  141*  --   BUN 14 12 10 13 14   --   --   --  20  --   CREATININE 0.99 1.00 0.90 0.84 0.96  --   --   --  1.09  --   CALCIUM 8.3*  --  8.6* 8.1* 7.8*  --   --   --  7.6*  --   MG  --   --   --   --   --   --   --   --  1.8  --   PHOS  --   --   --   --   --   --   --   --  2.5  --     Recent Labs Lab 02/08/15 1823  AST 28  ALT 25  ALKPHOS 54  BILITOT 0.6  PROT 6.8  ALBUMIN 3.7    Recent Labs Lab 02/08/15 1823 02/08/15 1827 02/09/15 0745 02/10/15 0227 02/11/15 0438  WBC 10.2  --  18.4* 23.7* 18.1*  NEUTROABS 7.3  --   --   --   --   HGB 13.0 14.6 14.3 15.6 13.9  HCT 39.8 43.0 42.6 44.6 40.9  MCV 89.0  --  86.6 83.8 85.9  PLT 166  --  180 180 174   No results for input(s): CKTOTAL, CKMB,  CKMBINDEX, TROPONINI in the last 168 hours.  Recent Labs  02/08/15 1823  LABPROT 14.0  INR 1.07    Recent Labs  02/08/15 1823  COLORURINE YELLOW  LABSPEC 1.016  PHURINE 6.5  GLUCOSEU NEGATIVE  HGBUR NEGATIVE  BILIRUBINUR NEGATIVE  KETONESUR NEGATIVE  PROTEINUR 100*  UROBILINOGEN 0.2  NITRITE NEGATIVE  LEUKOCYTESUR NEGATIVE       Component Value Date/Time   CHOL 178 02/09/2015 0745   TRIG 47 02/09/2015 0745   HDL 62 02/09/2015 0745   CHOLHDL 2.9 02/09/2015 0745   VLDL 9 02/09/2015 0745   LDLCALC 107* 02/09/2015 0745  Lab Results  Component Value Date   HGBA1C 6.0* 02/09/2015      Component Value Date/Time   LABOPIA NONE DETECTED 02/08/2015 1823   COCAINSCRNUR NONE DETECTED 02/08/2015 1823   LABBENZ NONE DETECTED 02/08/2015 1823   AMPHETMU NONE DETECTED 02/08/2015 1823   THCU NONE DETECTED 02/08/2015 1823   LABBARB NONE DETECTED 02/08/2015 1823     Recent Labs Lab 02/08/15 1826  ETH <5    I have personally reviewed the radiological images below and agree with the radiology interpretations.  Ct Head Wo Contrast 02/10/2015 - 1. No significant interval change in size of left occipital lobe intraparenchymal hematoma with slightly increased associated vasogenic edema as compared to previous. Similar trace left-to-right shift. 2. Stable to slightly decreased conspicuity of bilateral small volume subarachnoid hemorrhage. 3. Stable intraventricular hemorrhage. No hydrocephalus. 4. Persistent left subdural hematoma measuring up to 3.5 mm in maximal diameter. Blood along the left tentorium is decreased in conspicuity. 5. No new intracranial abnormality.  02/09/2015   IMPRESSION: 1. Slight enlargement of large left occipital lobe intra-axial hemorrhage. Estimated hemorrhage volume now 58 mm. Stable left posterior hemisphere edema and mass effect. 2. Mild progression of subarachnoid extension, now bilateral. Extension and of a left subdural space with small broad-based  left subdural hematoma not significantly changed. Stable intraventricular hemorrhage volume. No ventriculomegaly. 3. No new intracranial abnormality identified.      02/08/2015   IMPRESSION: 1. 6.7 cm left occipital parenchymal hemorrhage with mild surrounding edema. No midline shift. 2. Small amount of adjacent subarachnoid hemorrhage and small amount of intraventricular extension of blood. 3. Small subdural hematomas over the left cerebral convexity and left tentorium.   Dg Chest Port 1 View 02/10/15 Tube and catheter positions as described without pneumothorax. No edema or consolidation. 02/09/2015    IMPRESSION: No acute cardiopulmonary disease.     Carotid Doppler  pending  2D Echocardiogram  Pending  MRI and MRA pending  EEG This EEG is abnormal with moderately severe generalized continuous nonspecific slowing of cerebral activity. No evidence of epileptic activity was recorded. The absence of epileptiform activity during an EEG recording does not, in and of itself, rule out seizure disorder, however.  EKG  NSR  PHYSICAL EXAM  Temp:  [99.9 F (37.7 C)-101.9 F (38.8 C)] 100.9 F (38.3 C) (05/10 0739) Pulse Rate:  [59-85] 59 (05/10 1230) Resp:  [12-25] 14 (05/10 1230) BP: (116-171)/(62-81) 149/70 mmHg (05/10 1230) SpO2:  [97 %-100 %] 99 % (05/10 1230) FiO2 (%):  [40 %-50 %] 40 % (05/10 1125) Weight:  [222 lb 7.1 oz (100.9 kg)] 222 lb 7.1 oz (100.9 kg) (05/10 0450)  General - Well nourished, well developed, intubated and sedated but open eyes on voice.  Ophthalmologic - fundi not visualized due to incorporation.  Cardiovascular - Regular rate and rhythm.  Neuro -  Intubated and sedated, open eyes on voice stimulation but not able to keep eyes open without constant stimulation, not following commands. Neglect to the right, not blinking to visual threat to the right. left gaze preference but able to cross midline. PERRL, corneal and gag and cough reflex present. Moving all  extremities, however difficult to precisely evaluate due to not following commands. Seems L worse than right. Reflexes were 1+ in all extremities and Austin Sawyer had no pathological reflexes.  ASSESSMENT/PLAN Austin Sawyer is a 80 y.o. male with history of HTN, DM, CAD, a flutter on Coumadin before until 2010, alcohol abuse admitted for headache, nausea vomiting, confusion, blurred vision.  CT had showed left occipital ICH with small SAH, SDH and IVH. Symptoms and changed.    Left occipital ICH with small SAH, SDH and IVH - etiology unclear, HTN versus CAA  MRI  Pending   MRA  Pending   Repeat CT showed stable hematoma with slight extension of SAH and SDH, stable IVH  Carotid Doppler  pending  2D Echo  Pending  EEG no seizure  LDL 107, not at goal  HgbA1c 6.0  SCDs for VTE prophylaxis following for a long as Austin Sawyer is a going  Diet NPO time specified   no antithrombotic prior to admission, now on no antithrombotic due to ICH  Ongoing aggressive stroke risk factor management  Therapy recommendations:  Pending  Disposition:  Pending  Respiratory failure  Intubated  CXR did not show CHF or acute abnormalities.  Hyponatremia  Concerning for SIADH vs. salt wasting  Sodium from 143-> 131 -> 138 -> 145  Urine sodium 103, consistent with SIADH  Consider 3% saline  BP goal 150-155  Diabetes  HbA1c 6.0, goal < 7.0  Controlled  CBG monitoring  SSI  Hypertension  Home meds:  Coreg, losartan, BP goal < 160 Currently on cleviprex drip, maximized  Will put on PO BP meds after intubation   Hyperlipidemia  Home meds:   Vytorin  Currently on no med due to NPO  LDL 107, goal < 70  Resume home meds once passed swallow  Continue statin at discharge  A. flutter  Patient history of a flutter  Was on Coumadin in the past  no anticoagulation or antiplatelet PTA  No a flutter on telemetry  Patient may not be a candidate for anticoagulation  Other Stroke Risk  Factors  Advanced age  ETOH use  Obesity, Body mass index is 35.92 kg/(m^2).   Other Active Problems  Mild hypokalemia  Leukocytosis  Other Pertinent History    Hospital day # 3  This patient is critically ill due to respiratory failure, large left occipital ICH with SAH and IVH, hyponatremia and at significant risk of neurological worsening, death form respiratory failure, rebleeding, cerebral edema, brain herniation, and status epilepticus. This patient's care requires constant monitoring of vital signs, hemodynamics, respiratory and cardiac monitoring, review of multiple databases, neurological assessment, discussion with family, other specialists and medical decision making of high complexity. I spent 40 minutes of neurocritical care time in the care of this patient.  Marvel Plan, MD PhD Stroke Neurology 02/11/2015 12:59 PM    To contact Stroke Continuity provider, please refer to [email or web address]. After hours, contact General Neurology

## 2015-02-11 NOTE — Progress Notes (Signed)
Nutrition Follow-up  DOCUMENTATION CODES:  Obesity unspecified  INTERVENTION:  Tube feeding, Prostat  Decrease Vital High Protein to goal of 20 ml/hr via NG tube  60 ml Prostat TID.    Tube feeding regimen provides 1080 kcal, 132 grams of protein, and 401 ml of H2O.   TF regimen and propofol at current rate providing 1402 total kcal/day (13.8 kcal/kg of actual body weight)    NUTRITION DIAGNOSIS:  Inadequate oral intake related to inability to eat as evidenced by NPO status.  Ongoing.   GOAL:  Provide needs based on ASPEN/SCCM guidelines  Not met.   MONITOR:  TF tolerance, Vent status, Labs  REASON FOR ASSESSMENT:  Consult Enteral/tube feeding initiation and management  ASSESSMENT: Pt admitted as a code stroke, ICH on CT.  Pt decompensated 5/9 and intubated.   Patient is currently intubated on ventilator support MV: 9.3 L/min Temp (24hrs), Avg:100.5 F (38.1 C), Min:99.3 F (37.4 C), Max:101.9 F (38.8 C)  Propofol: 12.2 ml/hr currently providing 322 kcal per day from lipids NG tube in place (tip in distal stomach)  Pt discussed during ICU rounds and with RN.  Pt weaning and has planned MRI today.   Height:  Ht Readings from Last 1 Encounters:  02/08/15 _0  (1.676 m)    Weight:  Wt Readings from Last 1 Encounters:  02/11/15 222 lb 7.1 oz (100.9 kg)    Ideal Body Weight:  64.5 kg  Wt Readings from Last 10 Encounters:  02/11/15 222 lb 7.1 oz (100.9 kg)  03/15/14 227 lb (102.967 kg)  09/14/13 220 lb (99.791 kg)  07/12/13 217 lb (98.431 kg)  04/27/13 213 lb (96.616 kg)  04/22/13 220 lb (99.791 kg)  10/04/12 220 lb (99.791 kg)  06/23/12 219 lb (99.338 kg)  06/19/10 221 lb (100.245 kg)  12/09/09 240 lb (108.863 kg)    BMI:  Body mass index is 35.92 kg/(m^2).  Estimated Nutritional Needs:  Kcal:  4008-6761  Protein:  >/= 129 grams  Fluid:  > 1.5 L/day  Skin:  Reviewed, no issues  Diet Order:  Diet NPO time specified Vital  High Protein @ 40 ml/hr with 30 ml Prostat TID = 1380 kcal, 139 grams of protein, and 902 ml H2O  EDUCATION NEEDS:  No education needs identified at this time   Intake/Output Summary (Last 24 hours) at 02/11/15 1104 Last data filed at 02/11/15 0900  Gross per 24 hour  Intake 1763.75 ml  Output   2750 ml  Net -986.25 ml    Last BM:  5/9  [place], [place], [place] Pager [phone] After Hours Pager]

## 2015-02-11 NOTE — Consult Note (Addendum)
PULMONARY / CRITICAL CARE MEDICINE   Name: Austin Sawyer MRN: [id number] DOB: [birth year]    ADMISSION DATE:  02/08/2015 CONSULTATION DATE:  02/10/2015  REFERRING MD :  Neurology  CHIEF COMPLAINT:  ICH, headache and vomiting  INITIAL PRESENTATION: 80 year old male with PMH of HTN presenting with ICH on 5/7.  On 5/9 patient decompensated and was unable to protect his airway.  PCCM was called on consultation for intubation and TLC placement for 3% saline.  STUDIES:  5/9 repeat head CT with edema and ICH that was stable.  SIGNIFICANT EVENTS: 5/7 ICH on CT and AMS. 5/9 Unable to protect his airway and PCCM consulted.  SUBJECTIVE: vented, calm  VITAL SIGNS: Temp:  [99.3 F (37.4 C)-101.9 F (38.8 C)] 100.9 F (38.3 C) (05/10 0739) Pulse Rate:  [63-101] 67 (05/10 0900) Resp:  [12-34] 22 (05/10 0900) BP: (116-171)/(63-82) 151/66 mmHg (05/10 0900) SpO2:  [95 %-100 %] 100 % (05/10 0900) FiO2 (%):  [40 %-100 %] 40 % (05/10 0731) Weight:  [100.9 kg (222 lb 7.1 oz)-101.7 kg (224 lb 3.3 oz)] 100.9 kg (222 lb 7.1 oz) (05/10 0450) HEMODYNAMICS: CVP:  [4 mmHg-13 mmHg] 9 mmHg VENTILATOR SETTINGS: Vent Mode:  [-] CPAP;PSV FiO2 (%):  [40 %-100 %] 40 % Set Rate:  [16 bmp] 16 bmp Vt Set:  [510 mL] 510 mL PEEP:  [5 cmH20] 5 cmH20 Pressure Support:  [8 cmH20] 8 cmH20 Plateau Pressure:  [16 cmH20-20 cmH20] 20 cmH20 INTAKE / OUTPUT:  Intake/Output Summary (Last 24 hours) at 02/11/15 0918 Last data filed at 02/11/15 0900  Gross per 24 hour  Intake 1763.75 ml  Output   2750 ml  Net -986.25 ml    PHYSICAL EXAMINATION: General:  Acutely ill appearing, NAD, gurgling. Neuro:  rass -2 HEENT:  [place]/AT, PERRL Cardiovascular:  RRR, Nl S1/S2, -M/R/G. Lungs:  ronchi Abdomen:  Soft, NT, ND and +BS. Musculoskeletal:  Limited edema Skin:  Intact.  LABS:  CBC  Recent Labs Lab 02/09/15 0745 02/10/15 0227 02/11/15 0438  WBC 18.4* 23.7* 18.1*  HGB 14.3 15.6 13.9  HCT 42.6 44.6 40.9  PLT 180 180  174   Coag's  Recent Labs Lab 02/08/15 1823  APTT 27  INR 1.07   BMET  Recent Labs Lab 02/10/15 0227 02/10/15 1055  02/11/15 0209 02/11/15 0438 02/11/15 0835  NA 131* 132*  < > 138 141 142  K 3.2* 2.8*  --   --  3.6  --   CL 98* 98*  --   --  109  --   CO2 22 24  --   --  24  --   BUN 13 14  --   --  20  --   CREATININE 0.84 0.96  --   --  1.09  --   GLUCOSE 170* 141*  --   --  141*  --   < > = values in this interval not displayed. Electrolytes  Recent Labs Lab 02/10/15 0227 02/10/15 1055 02/11/15 0438  CALCIUM 8.1* 7.8* 7.6*  MG  --   --  1.8  PHOS  --   --  2.5   Sepsis Markers No results for input(s): LATICACIDVEN, PROCALCITON, O2SATVEN in the last 168 hours. ABG  Recent Labs Lab 02/10/15 1229 02/11/15 0330  PHART 7.413 7.398  PCO2ART 39.0 39.5  PO2ART 334.0* 126*   Liver Enzymes  Recent Labs Lab 02/08/15 1823  AST 28  ALT 25  ALKPHOS 54  BILITOT 0.6  ALBUMIN 3.7  Cardiac Enzymes No results for input(s): TROPONINI, PROBNP in the last 168 hours. Glucose  Recent Labs Lab 02/10/15 1134 02/10/15 1548 02/10/15 1934 02/10/15 2343 02/11/15 0341 02/11/15 0737  GLUCAP 142* 116* 131* 147* 131* 124*    Imaging Ct Head Wo Contrast  02/10/2015   CLINICAL DATA:  Follow-up exam for intracranial hematoma.  EXAM: CT HEAD WITHOUT CONTRAST  TECHNIQUE: Contiguous axial images were obtained from the base of the skull through the vertex without intravenous contrast.  COMPARISON:  Prior CT from 02/09/2015.  FINDINGS: Large left occipital lobe intra-axial hemorrhage again seen. This measures approximately 6.6 x 3.4 cm on today's study, overall not significantly changed. Associated vasogenic edema slightly increased. Previously seen small amount of subdural extension along the left tentorium is less prominent as compared to prior study. There remains a subdural hematoma measuring up to 3.5 mm in maximal thickness overlying the left temporal region. This is  similar. Additionally, scattered bilateral subarachnoid hemorrhage again noted, slightly decreased in prevalence. This has redistributed more cephalad as compared to prior. Intraventricular extension with small amount of blood again seen in the occipital horns, overall similar to previous. No hydrocephalus. Trace rightward shift is stable. Basilar cisterns remain patent. Fourth ventricle patent.  No new large vessel territory infarct.  Scalp soft tissues unremarkable. Orbits within normal limits. Paranasal sinuses remain clear. No mastoid effusion. Calvarium intact.  IMPRESSION: 1. No significant interval change in size of left occipital lobe intraparenchymal hematoma with slightly increased associated vasogenic edema as compared to previous. Similar trace left-to-right shift. 2. Stable to slightly decreased conspicuity of bilateral small volume subarachnoid hemorrhage. 3. Stable intraventricular hemorrhage.  No hydrocephalus. 4. Persistent left subdural hematoma measuring up to 3.5 mm in maximal diameter. Blood along the left tentorium is decreased in conspicuity. 5. No new intracranial abnormality.   Electronically Signed   By: Rise MuBenjamin  McClintock M.D.   On: 02/10/2015 06:58   Dg Chest Port 1 View  02/10/2015   CLINICAL DATA:  Hypoxia  EXAM: PORTABLE CHEST - 1 VIEW  COMPARISON:  Feb 10, 2015  FINDINGS: Endotracheal tube tip is 3.8 cm above the carina. Central catheter tip is in the superior vena cava. No pneumothorax. There is stable elevation of the left hemidiaphragm. There is no edema or consolidation. Heart is upper normal in size with pulmonary vascularity within normal limits. There is atherosclerotic change in the aorta.  IMPRESSION: Tube and catheter positions as described without pneumothorax. No edema or consolidation.   Electronically Signed   By: Bretta BangWilliam  Woodruff III M.D.   On: 02/10/2015 10:53   Dg Chest Port 1 View  02/10/2015   CLINICAL DATA:  CHF  EXAM: PORTABLE CHEST - 1 VIEW  COMPARISON:   02/09/2015  FINDINGS: Borderline cardiomegaly. There is upper mediastinal widening which is likely from patient positioning. Mild pulmonary venous congestion without edema, accentuated by low volumes and positioning. No evidence for pneumonia, effusion, or pneumothorax.  IMPRESSION: Stable low volume chest.  No pulmonary edema.   Electronically Signed   By: Marnee SpringJonathon  Watts M.D.   On: 02/10/2015 09:57   Dg Abd Portable 1v  02/10/2015   CLINICAL DATA:  Feeding tube placement  EXAM: PORTABLE ABDOMEN - 1 VIEW  COMPARISON:  No recent similar comparison exam  FINDINGS: Nasogastric tube tip terminates over the expected location of the distal stomach. Mild gaseous gastric distention. A few prominent loops of small bowel project over the left mid abdomen measuring 3.2 cm maximally. Presence or absence of air-fluid levels  or free air is suboptimally evaluated on this supine projection.  IMPRESSION: Nasogastric tube tip terminates over the expected location of the distal stomach.   Electronically Signed   By: Christiana PellantGretchen  Green M.D.   On: 02/10/2015 13:28     ASSESSMENT / PLAN:  PULMONARY OETT 5/9>>> A: VDRF due to inability to protect his airways. ATX P:   - Full vent support. -ABG reviewed, keep same MV -pcxr follow up for atx, int changes -obtain cvp -consider sbt today , requiring PS 8 thus far, goal 2 hrs and down to ps 5 if able -even balance goals  CARDIOVASCULAR CVL L IJ TLC 5/9>>> A: HTN in setting ICH P:  - assess cvp as relates to int changes on pcxr -consider echo assessment -consider sys 150, clividine -oral agents added, unlikely to use norvasc as will not take effect for days, avoid ACEI with upper airway issues to start  RENAL A:  Hyponatremia, hypokalemia and hypochloremia. P:   - 3% saline per neuro - Replace K, mag -likely to dc daily K supp and dose on own - BMET in AM. - avoid free water -would not use lasix with 3%  GASTROINTESTINAL A:  No active issues. P:   -  PPI. Ensure TF on going  HEMATOLOGIC A:  DVT prevention P:  - CBC in AM. - Transfusion per ICU protocol. -consider day 5 start sub q hep, per neuro  INFECTIOUS A:  WBC elevated but no signs of infection. P:   - pcxr in am  -if fever add unasyn  ENDOCRINE A:  No diabetes by history.   P:   - Monitor glu am   NEUROLOGIC A:  ICH, unable to protect his airway. P:   RASS goal: 0 - 3% saline - Propofol for sedation. - PRN fentanyl. - Neurology following -hob elevated  FAMILY  - Updates: No family bedside.  Ccm time 30 min   Mcarthur Rossettianiel J. Tyson AliasFeinstein, MD, FACP Pgr: [phone] [facility]

## 2015-02-11 NOTE — Progress Notes (Signed)
OT Cancellation Note  Patient Details Name: Austin Sawyer MRN: [id number] DOB: 09/15/1935   Cancelled Treatment:    Reason Eval/Treat Not Completed: Patient not medically ready Pt sedated and on full vent support. Will assess when appropriate. [facility] D[facility] Amb[facility] Surgery CenterWARD,HILLARY  Yvana Samonte, OTR/L  [phone] 02/11/2015 02/11/2015, 1:43 PM

## 2015-02-11 NOTE — Clinical Social Work Note (Signed)
Clinical Social Worker received referral for potential SNF placement.  Patient is currently intubated and on the ventilator.  CSW to follow up with patient and family once appropriate and therapy evaluations are completed.  Macario GoldsJesse Rodrigus Kilker, Kentucky[place]CSW [phone]

## 2015-02-11 NOTE — Progress Notes (Signed)
Transported pt on vent to MRI and back

## 2015-02-11 NOTE — Progress Notes (Signed)
ANTIBIOTIC CONSULT NOTE  Pharmacy Consult for Unasyn Indication: pneumonia  No Known Allergies  Patient Measurements: Height: 5\' 6"  (167.6 cm) Weight: 222 lb 7.1 oz (100.9 kg) IBW/kg (Calculated) : 63.8 Adjusted Body Weight:   Vital Signs: Temp: 100.9 F (38.3 C) (05/10 0739) Temp Source: Axillary (05/10 0739) BP: 151/66 mmHg (05/10 0900) Pulse Rate: 67 (05/10 0900) Intake/Output from previous day: 05/09 0701 - 05/10 0700 In: 2041.3 [I.V.:1938.3; NG/GT:102.9] Out: 2400 [Urine:2400] Intake/Output from this shift: Total I/O In: 214.5 [I.V.:214.5] Out: 350 [Urine:350]  Labs:  Recent Labs  02/09/15 0745 02/10/15 0227 02/10/15 1055 02/10/15 1538 02/11/15 0438  WBC 18.4* 23.7*  --   --  18.1*  HGB 14.3 15.6  --   --  13.9  PLT 180 180  --   --  174  LABCREA  --   --   --  42.92  --   CREATININE 0.90 0.84 0.96  --  1.09   Estimated Creatinine Clearance: 62.1 mL/min (by C-G formula based on Cr of 1.09). No results for input(s): VANCOTROUGH, VANCOPEAK, VANCORANDOM, GENTTROUGH, GENTPEAK, GENTRANDOM, TOBRATROUGH, TOBRAPEAK, TOBRARND, AMIKACINPEAK, AMIKACINTROU, AMIKACIN in the last 72 hours.   Microbiology: Recent Results (from the past 720 hour(s))  MRSA PCR Screening     Status: None   Collection Time: 02/08/15  9:53 PM  Result Value Ref Range Status   MRSA by PCR NEGATIVE NEGATIVE Final    Comment:        The GeneXpert MRSA Assay (FDA approved for NASAL specimens only), is one component of a comprehensive MRSA colonization surveillance program. It is not intended to diagnose MRSA infection nor to guide or monitor treatment for MRSA infections.     Medical History: Past Medical History  Diagnosis Date  . Hypertension   . Coronary artery disease   . Diabetes mellitus without complication   . MI, old   . Reflux   . Depression   . Cervicalgia   . Disturbance of skin sensation   . Coronary atherosclerosis of unspecified type of vessel, native or graft    . Unspecified hearing loss   . Unspecified sleep apnea   . Alcohol abuse, in remission   . Atrial flutter     Medications:  Prescriptions prior to admission  Medication Sig Dispense Refill Last Dose  . allopurinol (ZYLOPRIM) 300 MG tablet Take 300 mg by mouth daily.   02/08/2015 at 0800  . carvedilol (COREG) 25 MG tablet Take 37.5 mg by mouth 2 (two) times daily with a meal.   02/08/2015 at 0800  . colchicine 0.6 MG tablet Take 1 tablet (0.6 mg total) by mouth daily. 30 tablet 0 02/08/2015 at 0800  . DULoxetine (CYMBALTA) 20 MG capsule Take 40 mg by mouth daily.   02/08/2015 at 0800  . gabapentin (NEURONTIN) 300 MG capsule Take 300 mg by mouth daily.   02/08/2015 at 0800  . losartan (COZAAR) 100 MG tablet Take 100 mg by mouth daily.   02/08/2015 at 0800  . metFORMIN (GLUCOPHAGE-XR) 500 MG 24 hr tablet Take 1 tablet (500 mg total) by mouth daily with breakfast. 30 tablet 0 02/08/2015 at 0800  . omeprazole (PRILOSEC) 20 MG capsule Take 20 mg by mouth daily.   02/08/2015 at 0800  . valsartan-hydrochlorothiazide (DIOVAN-HCT) 160-12.5 MG per tablet Take 1 tablet by mouth daily.   02/08/2015 at 0800  . darifenacin (ENABLEX) 7.5 MG 24 hr tablet Take 1 tablet (7.5 mg total) by mouth daily. (Patient not taking: Reported on 11/20/2014)  30 tablet 0 Not Taking at Unknown time  . DULoxetine (CYMBALTA) 60 MG capsule Take 1 capsule (60 mg total) by mouth daily. (Patient not taking: Reported on 11/20/2014) 60 capsule 12 Not Taking at Unknown time  . ezetimibe-simvastatin (VYTORIN) 10-80 MG per tablet Take 1 tablet by mouth at bedtime. (Patient not taking: Reported on 11/20/2014) 30 tablet 0 Not Taking at Unknown time  . hydrOXYzine (VISTARIL) 50 MG capsule Take 1 capsule (50 mg total) by mouth at bedtime. (Patient not taking: Reported on 11/20/2014) 30 capsule 0 Not Taking at Unknown time  . mirtazapine (REMERON) 15 MG tablet Take 1 tablet (15 mg total) by mouth at bedtime as needed (insomia). (Patient not taking: Reported on  11/20/2014) 30 tablet 0 Not Taking at Unknown time  . nitroGLYCERIN (NITROSTAT) 0.4 MG SL tablet Place 0.4 mg under the tongue every 5 (five) minutes as needed. Chest pain   Not Taking at Unknown time  . solifenacin (VESICARE) 5 MG tablet Take 2 tablets (10 mg total) by mouth daily. (Patient not taking: Reported on 11/20/2014) 60 tablet 0 Not Taking at Unknown time  . tamsulosin (FLOMAX) 0.4 MG CAPS Take 1 capsule (0.4 mg total) by mouth daily. (Patient not taking: Reported on 11/20/2014) 30 capsule 0 Not Taking at Unknown time  . telmisartan-hydrochlorothiazide (MICARDIS HCT) 80-25 MG per tablet Take 1 tablet by mouth daily. (Patient not taking: Reported on 11/20/2014) 30 tablet 0 Not Taking at Unknown time   Assessment: 80 yo male admitted with L occipital ICH with small SAH, SDH, IVH, now on 3% saline and clevidipine infusion.  Initiating abx for possible aspiration pneumonia, Tmax/24h 101.9, WBC 23.7 > 18.1.  Goal of Therapy:  Resolution of infection  Plan:  -Unasyn 3 g IV q6h -F/u duration, cultures, renal function  Agapito GamesAlison Zaccheaus Storlie, PharmD, BCPS Clinical Pharmacist Pager: [phone] 02/11/2015 9:52 AM

## 2015-02-11 NOTE — Progress Notes (Signed)
Chaplain responded to spiritual care consult for pt and family. Chaplain coordinated with pt nurse for support as needed. Page chaplain as needed.  Zamiya Dillard, Mayer MaskerCourtney F, Chaplain 02/11/2015 10:52 AM

## 2015-02-11 NOTE — Progress Notes (Signed)
PT Cancellation Note  Patient Details Name: [name] MRN: [id number] DOB: 11/09/1934   Cancelled Treatment:    Reason Eval/Treat Not Completed: Patient not medically ready.  Sedated on full vent support.  Will see when appropriate. 02/11/2015  [name], PT [phone]  (pager)   [name], [name] 02/11/2015, 5:03 PM

## 2015-02-11 NOTE — Progress Notes (Signed)
[facility].  Pt just reintubated in last 24hrs.  CM will follow and address needs according to MD orders.  HH is not an immediate need. Pt does have traditional Medicare and thus would be eligible for LTACH placement if his medical needs warranted it.   Carlyle LipaMichelle Taitum Alms, RN BSN MHA CCM  Case Manager, Trauma Service/Unit 13M [phone]

## 2015-02-11 NOTE — Progress Notes (Signed)
Carotid Duplex Completed.  Preliminary results by tech -No evidence of significant stenosis in the right or left ICA. The left side was technically challenged due to line placement.  Marilynne Halstedita Alieah Brinton, BS, RDMS, RVT

## 2015-02-11 NOTE — Progress Notes (Signed)
ST abnoramily noted on EKG. Spoke to NP Renae FicklePaul, who was on unit, order of 12 lead EKG, and troponin's obtained. Renae Fickleaul made aware of EKG results. No distress noted at this time. Will continue to monitor. Herma ArdMesser, Makail Watling RN BSN.

## 2015-02-12 ENCOUNTER — Inpatient Hospital Stay (HOSPITAL_COMMUNITY): Payer: Medicare Other

## 2015-02-12 DIAGNOSIS — I6789 Other cerebrovascular disease: Secondary | ICD-10-CM

## 2015-02-12 DIAGNOSIS — I5020 Unspecified systolic (congestive) heart failure: Secondary | ICD-10-CM

## 2015-02-12 DIAGNOSIS — I509 Heart failure, unspecified: Secondary | ICD-10-CM | POA: Insufficient documentation

## 2015-02-12 LAB — GLUCOSE, CAPILLARY
GLUCOSE-CAPILLARY: 102 mg/dL — AB (ref 70–99)
GLUCOSE-CAPILLARY: 117 mg/dL — AB (ref 70–99)
GLUCOSE-CAPILLARY: 88 mg/dL (ref 70–99)
Glucose-Capillary: 110 mg/dL — ABNORMAL HIGH (ref 70–99)
Glucose-Capillary: 109 mg/dL — ABNORMAL HIGH (ref 70–99)
Glucose-Capillary: 103 mg/dL — ABNORMAL HIGH (ref 70–99)

## 2015-02-12 LAB — COMPREHENSIVE METABOLIC PANEL
ALT: 27 U/L (ref 17–63)
AST: 25 U/L (ref 15–41)
Albumin: 2.4 g/dL — ABNORMAL LOW (ref 3.5–5.0)
Alkaline Phosphatase: 44 U/L (ref 38–126)
Anion gap: 8 (ref 5–15)
BUN: 26 mg/dL — AB (ref 6–20)
CALCIUM: 7.6 mg/dL — AB (ref 8.9–10.3)
CO2: 26 mmol/L (ref 22–32)
Chloride: 118 mmol/L — ABNORMAL HIGH (ref 101–111)
Creatinine, Ser: 1.05 mg/dL (ref 0.61–1.24)
GLUCOSE: 135 mg/dL — AB (ref 70–99)
Potassium: 3.9 mmol/L (ref 3.5–5.1)
Sodium: 152 mmol/L — ABNORMAL HIGH (ref 135–145)
Total Bilirubin: 0.5 mg/dL (ref 0.3–1.2)
Total Protein: 5.8 g/dL — ABNORMAL LOW (ref 6.5–8.1)

## 2015-02-12 LAB — CBC WITH DIFFERENTIAL/PLATELET
BASOS PCT: 0 % (ref 0–1)
Basophils Absolute: 0 10*3/uL (ref 0.0–0.1)
EOS PCT: 0 % (ref 0–5)
Eosinophils Absolute: 0.1 10*3/uL (ref 0.0–0.7)
HCT: 38.2 % — ABNORMAL LOW (ref 39.0–52.0)
Hemoglobin: 12.3 g/dL — ABNORMAL LOW (ref 13.0–17.0)
LYMPHS PCT: 6 % — AB (ref 12–46)
Lymphs Abs: 0.9 10*3/uL (ref 0.7–4.0)
MCH: 28.5 pg (ref 26.0–34.0)
MCHC: 32.2 g/dL (ref 30.0–36.0)
MCV: 88.6 fL (ref 78.0–100.0)
MONO ABS: 1.8 10*3/uL — AB (ref 0.1–1.0)
Monocytes Relative: 11 % (ref 3–12)
Neutro Abs: 12.8 10*3/uL — ABNORMAL HIGH (ref 1.7–7.7)
Neutrophils Relative %: 83 % — ABNORMAL HIGH (ref 43–77)
Platelets: 153 10*3/uL (ref 150–400)
RBC: 4.31 MIL/uL (ref 4.22–5.81)
RDW: 15.2 % (ref 11.5–15.5)
WBC: 15.6 10*3/uL — ABNORMAL HIGH (ref 4.0–10.5)

## 2015-02-12 LAB — SODIUM
SODIUM: 160 mmol/L — AB (ref 135–145)
Sodium: 151 mmol/L — ABNORMAL HIGH (ref 135–145)
Sodium: 154 mmol/L — ABNORMAL HIGH (ref 135–145)
Sodium: 156 mmol/L — ABNORMAL HIGH (ref 135–145)

## 2015-02-12 LAB — OSMOLALITY, URINE: Osmolality, Ur: 706 mOsm/kg (ref 390–1090)

## 2015-02-12 LAB — SODIUM, URINE, RANDOM: Sodium, Ur: 105 mmol/L

## 2015-02-12 LAB — TROPONIN I
Troponin I: 0.06 ng/mL — ABNORMAL HIGH (ref <0.031)
Troponin I: 0.1 ng/mL — ABNORMAL HIGH (ref <0.031)

## 2015-02-12 LAB — TRIGLYCERIDES: Triglycerides: 121 mg/dL (ref <150)

## 2015-02-12 MED ORDER — HEPARIN SODIUM (PORCINE) 5000 UNIT/ML IJ SOLN
5000.0000 [IU] | Freq: Three times a day (TID) | INTRAMUSCULAR | Status: DC
  Administered 2015-02-12 – 2015-02-21 (×28): 5000 [IU] via SUBCUTANEOUS
  Filled 2015-02-12 (×31): qty 1

## 2015-02-12 MED ORDER — METOPROLOL TARTRATE 50 MG PO TABS
50.0000 mg | ORAL_TABLET | Freq: Two times a day (BID) | ORAL | Status: DC
  Administered 2015-02-13: 50 mg via ORAL
  Filled 2015-02-12 (×5): qty 1

## 2015-02-12 MED ORDER — ACETYLCYSTEINE 20 % IN SOLN
4.0000 mL | Freq: Two times a day (BID) | RESPIRATORY_TRACT | Status: AC
  Administered 2015-02-12 – 2015-02-13 (×3): 4 mL via RESPIRATORY_TRACT
  Filled 2015-02-12 (×5): qty 4

## 2015-02-12 MED ORDER — HYDRALAZINE HCL 25 MG PO TABS
25.0000 mg | ORAL_TABLET | Freq: Three times a day (TID) | ORAL | Status: DC
  Administered 2015-02-12: 25 mg
  Filled 2015-02-12 (×3): qty 1

## 2015-02-12 MED ORDER — ALBUTEROL SULFATE (2.5 MG/3ML) 0.083% IN NEBU
2.5000 mg | INHALATION_SOLUTION | Freq: Two times a day (BID) | RESPIRATORY_TRACT | Status: DC | PRN
  Administered 2015-02-12 – 2015-02-13 (×3): 2.5 mg via RESPIRATORY_TRACT
  Filled 2015-02-12 (×4): qty 3

## 2015-02-12 MED ORDER — HYDRALAZINE HCL 50 MG PO TABS
50.0000 mg | ORAL_TABLET | Freq: Three times a day (TID) | ORAL | Status: DC
  Administered 2015-02-12 – 2015-02-13 (×2): 50 mg
  Filled 2015-02-12 (×5): qty 1

## 2015-02-12 MED ORDER — HYDRALAZINE HCL 20 MG/ML IJ SOLN
10.0000 mg | INTRAMUSCULAR | Status: DC | PRN
  Administered 2015-02-12 – 2015-02-13 (×2): 10 mg via INTRAVENOUS
  Filled 2015-02-12 (×2): qty 1

## 2015-02-12 NOTE — Progress Notes (Addendum)
Called Elink re patient's Na level of 160.  [facility] notifying that 3% Sodium rate was dropped fro 50 ml/hr to 25 ml/hr per taper protocol.  [facility] and advise. Georgana Romain C  Dr. Dellie CatholicSommers [facility].  Agreed to taper and said to notifiy of Na results after 2000 lab draw and continue 50% taper. Baldwin Racicot C

## 2015-02-12 NOTE — Progress Notes (Signed)
Rt called to room 3M11 due to tube being placed at 20@lip . Rt advanced tube back to 24@lip  to where it is supposed to be. Chest x-ray pending Dr. Tyson AliasFeinstein aware. Pt getting good volumes on vent.

## 2015-02-12 NOTE — Progress Notes (Signed)
Echocardiogram 2D Echocardiogram has been performed.  Austin Sawyer, Austin Sawyer 02/12/2015, 5:05 PM

## 2015-02-12 NOTE — Progress Notes (Signed)
STROKE TEAM PROGRESS NOTE   SUBJECTIVE (INTERVAL HISTORY) His RN is at the bedside. He was still intubated and sedated. Na trending up nicely, this am 154. His BP still on the high side, currently off claviprex but is going to put back on. Moving all extremities and open eyes on voice stimulation.    OBJECTIVE Temp:  [98.5 F (36.9 C)-99.9 F (37.7 C)] 99.6 F (37.6 C) (05/11 1110) Pulse Rate:  [52-78] 60 (05/11 1300) Cardiac Rhythm:  [-] Sinus bradycardia (05/11 1000) Resp:  [12-23] 16 (05/11 1300) BP: (95-198)/(41-122) 170/71 mmHg (05/11 1307) SpO2:  [96 %-100 %] 100 % (05/11 1300) FiO2 (%):  [40 %] 40 % (05/11 1135) Weight:  [221 lb 9 oz (100.5 kg)] 221 lb 9 oz (100.5 kg) (05/11 0500)   Recent Labs Lab 02/11/15 1942 02/12/15 0023 02/12/15 0453 02/12/15 0816 02/12/15 1316  GLUCAP 98 117* 102* 110* 88    Recent Labs Lab 02/09/15 0745 02/10/15 0227 02/10/15 1055  02/11/15 0438  02/11/15 1350 02/11/15 2028 02/12/15 0145 02/12/15 0420 02/12/15 0817  NA 134* 131* 132*  < > 141  < > 145 149* 151* 152* 154*  K 3.3* 3.2* 2.8*  --  3.6  --   --   --   --  3.9  --   CL 97* 98* 98*  --  109  --   --   --   --  118*  --   CO2 22 22 24   --  24  --   --   --   --  26  --   GLUCOSE 178* 170* 141*  --  141*  --   --   --   --  135*  --   BUN 10 13 14   --  20  --   --   --   --  26*  --   CREATININE 0.90 0.84 0.96  --  1.09  --   --   --   --  1.05  --   CALCIUM 8.6* 8.1* 7.8*  --  7.6*  --   --   --   --  7.6*  --   MG  --   --   --   --  1.8  --   --   --   --   --   --   PHOS  --   --   --   --  2.5  --   --   --   --   --   --   < > = values in this interval not displayed.  Recent Labs Lab 02/08/15 1823 02/12/15 0420  AST 28 25  ALT 25 27  ALKPHOS 54 44  BILITOT 0.6 0.5  PROT 6.8 5.8*  ALBUMIN 3.7 2.4*    Recent Labs Lab 02/08/15 1823 02/08/15 1827 02/09/15 0745 02/10/15 0227 02/11/15 0438 02/12/15 0420  WBC 10.2  --  18.4* 23.7* 18.1* 15.6*  NEUTROABS  7.3  --   --   --   --  12.8*  HGB 13.0 14.6 14.3 15.6 13.9 12.3*  HCT 39.8 43.0 42.6 44.6 40.9 38.2*  MCV 89.0  --  86.6 83.8 85.9 88.6  PLT 166  --  180 180 174 153    Recent Labs Lab 02/11/15 2345 02/12/15 0420 02/12/15 1000  TROPONINI 0.10* 0.06* <0.03   No results for input(s): LABPROT, INR in the last 72 hours. No results for input(s): COLORURINE, LABSPEC, PHURINE,  GLUCOSEU, HGBUR, BILIRUBINUR, KETONESUR, PROTEINUR, UROBILINOGEN, NITRITE, LEUKOCYTESUR in the last 72 hours.  Invalid input(s): APPERANCEUR     Component Value Date/Time   CHOL 178 02/09/2015 0745   TRIG 121 02/12/2015 1141   HDL 62 02/09/2015 0745   CHOLHDL 2.9 02/09/2015 0745   VLDL 9 02/09/2015 0745   LDLCALC 107* 02/09/2015 0745   Lab Results  Component Value Date   HGBA1C 6.0* 02/09/2015      Component Value Date/Time   LABOPIA NONE DETECTED 02/08/2015 1823   COCAINSCRNUR NONE DETECTED 02/08/2015 1823   LABBENZ NONE DETECTED 02/08/2015 1823   AMPHETMU NONE DETECTED 02/08/2015 1823   THCU NONE DETECTED 02/08/2015 1823   LABBARB NONE DETECTED 02/08/2015 1823     Recent Labs Lab 02/08/15 1826  ETH <5    I have personally reviewed the radiological images below and agree with the radiology interpretations.  Ct Head Wo Contrast 02/10/2015 - 1. No significant interval change in size of left occipital lobe intraparenchymal hematoma with slightly increased associated vasogenic edema as compared to previous. Similar trace left-to-right shift. 2. Stable to slightly decreased conspicuity of bilateral small volume subarachnoid hemorrhage. 3. Stable intraventricular hemorrhage. No hydrocephalus. 4. Persistent left subdural hematoma measuring up to 3.5 mm in maximal diameter. Blood along the left tentorium is decreased in conspicuity. 5. No new intracranial abnormality.  02/09/2015   IMPRESSION: 1. Slight enlargement of large left occipital lobe intra-axial hemorrhage. Estimated hemorrhage volume now 58  mm. Stable left posterior hemisphere edema and mass effect. 2. Mild progression of subarachnoid extension, now bilateral. Extension and of a left subdural space with small broad-based left subdural hematoma not significantly changed. Stable intraventricular hemorrhage volume. No ventriculomegaly. 3. No new intracranial abnormality identified.      02/08/2015   IMPRESSION: 1. 6.7 cm left occipital parenchymal hemorrhage with mild surrounding edema. No midline shift. 2. Small amount of adjacent subarachnoid hemorrhage and small amount of intraventricular extension of blood. 3. Small subdural hematomas over the left cerebral convexity and left tentorium.   Dg Chest Port 1 View 02/10/15 Tube and catheter positions as described without pneumothorax. No edema or consolidation. 02/09/2015    IMPRESSION: No acute cardiopulmonary disease.     Carotid Doppler  No evidence of significant stenosis in the right or left ICA. The left side was technically challenged due to line placement.   2D Echocardiogram  Pending  MRI and MRA  7.3 x 3.3 cm left occipital hematoma unchanged from the prior CT. There is intraventricular hemorrhage and subarachnoid hemorrhage also unchanged. There is mass-effect on the left occipital horn and 3 mm midline shift to the right. No evidence of underlying tumor or vascular malformation. This may be related to cerebral amyloid angiography. Hypertension also possible. Negative MRA circle Willis.  EEG This EEG is abnormal with moderately severe generalized continuous nonspecific slowing of cerebral activity. No evidence of epileptic activity was recorded. The absence of epileptiform activity during an EEG recording does not, in and of itself, rule out seizure disorder, however.  EKG  NSR  PHYSICAL EXAM  Temp:  [98.5 F (36.9 C)-99.9 F (37.7 C)] 99.6 F (37.6 C) (05/11 1110) Pulse Rate:  [52-78] 60 (05/11 1300) Resp:  [12-23] 16 (05/11 1300) BP: (95-198)/(41-122) 170/71 mmHg  (05/11 1307) SpO2:  [96 %-100 %] 100 % (05/11 1300) FiO2 (%):  [40 %] 40 % (05/11 1135) Weight:  [221 lb 9 oz (100.5 kg)] 221 lb 9 oz (100.5 kg) (05/11 0500)  General - Well nourished,  well developed, intubated and sedated but open eyes on voice.  Ophthalmologic - fundi not visualized due to incorporation.  Cardiovascular - Regular rate and rhythm.  Neuro -  Intubated and sedated, open eyes on voice stimulation but not able to keep eyes open without constant stimulation, not following commands. Neglect to the right, not blinking to visual threat to the right. left gaze preference but able to cross midline. PERRL, corneal and gag and cough reflex present. Moving all extremities, however difficult to precisely evaluate due to not following commands. Seems L worse than right. Reflexes were 1+ in all extremities and he had no pathological reflexes.  ASSESSMENT/PLAN Austin Sawyer is a 80 y.o. male with history of HTN, DM, CAD, a flutter on Coumadin before until 2010, alcohol abuse admitted for headache, nausea vomiting, confusion, blurred vision. CT had showed left occipital ICH with small SAH, SDH and IVH. Symptoms and changed.    Left occipital ICH with small SAH, SDH and IVH - etiology unclear, HTN versus CAA  MRI  Stable ICH, SDH and IVH - no new bleeding and minimal midline shift   MRA  unremarkable   Repeat CT showed stable hematoma with slight extension of SAH and SDH, stable IVH  Carotid Doppler  unremarkable  2D Echo  Pending  EEG no seizure  LDL 107, not at goal  HgbA1c 6.0  heparin subq for VTE prophylaxis  Diet NPO time specified   no antithrombotic prior to admission, now on no antithrombotic due to ICH  Ongoing aggressive stroke risk factor management  Therapy recommendations:  Pending  Disposition:  Pending  Respiratory failure  Intubated  CXR did not show CHF or acute abnormalities.  CCM following  Extubate as able  Hyponatremia  Concerning for  SIADH vs. salt wasting  Sodium from 143-> 131 -> 138 -> 145->154  Urine sodium 103, consistent with SIADH  On 3% saline  BP goal 150-155  Diabetes  HbA1c 6.0, goal < 7.0  Controlled  CBG monitoring  SSI  Hypertension  Home meds:  Coreg, losartan, BP goal < 160 Currently on cleviprex drip, maximized  Will put on PO BP meds after intubation   Hyperlipidemia  Home meds:   Vytorin  LDL 107, goal < 70  Statin on hold now due to ICH  Continue statin at discharge  A. flutter  Patient history of a flutter  Was on Coumadin in the past  no anticoagulation or antiplatelet PTA  No a flutter on telemetry  Patient may not be a candidate for anticoagulation  Other Stroke Risk Factors  Advanced age  ETOH use  Obesity, Body mass index is 35.78 kg/(m^2).   Other Active Problems  Mild hypokalemia  Leukocytosis  Other Pertinent History    Hospital day # 4  This patient is critically ill due to respiratory failure, large left occipital ICH with SAH and IVH, hyponatremia and at significant risk of neurological worsening, death form respiratory failure, rebleeding, cerebral edema, brain herniation, and status epilepticus. This patient's care requires constant monitoring of vital signs, hemodynamics, respiratory and cardiac monitoring, review of multiple databases, neurological assessment, discussion with family, other specialists and medical decision making of high complexity. I spent 40 minutes of neurocritical care time in the care of this patient.  Marvel Plan, MD PhD Stroke Neurology 02/12/2015 2:13 PM    To contact Stroke Continuity provider, please refer to [email or web address]. After hours, contact General Neurology

## 2015-02-12 NOTE — Progress Notes (Signed)
Per MD leave pt on 8 of PEEP tell further orders.

## 2015-02-12 NOTE — Consult Note (Signed)
PULMONARY / CRITICAL CARE MEDICINE   Name: Candise CheLeon Grover MRN: [id number] DOB: 09/02/1935    ADMISSION DATE:  02/08/2015 CONSULTATION DATE:  02/10/2015  REFERRING MD :  Neurology  CHIEF COMPLAINT:  ICH, headache and vomiting  INITIAL PRESENTATION: 80 year old male with PMH of HTN presenting with ICH on 5/7.  On 5/9 patient decompensated and was unable to protect his airway.  PCCM was called on consultation for intubation and TLC placement for 3% saline.  STUDIES:  5/9 repeat head CT with edema and ICH that was stable. 5/10 mri brain - 7.3 x 3.3 cm left occipital hematoma unchanged from the prior CT. There is intraventricular hemorrhage and subarachnoid hemorrhage also unchanged. There is mass-effect on the left occipital horn and 3 mm midline shift to the right.   SIGNIFICANT EVENTS: 5/7 ICH on CT and AMS. 5/9 Unable to protect his airway and PCCM consulted. 5/10- remains HTN  SUBJECTIVE: neurostatus more awake, HTn remains  VITAL SIGNS: Temp:  [98.5 F (36.9 C)-101.1 F (38.4 C)] 99.6 F (37.6 C) (05/11 1110) Pulse Rate:  [52-78] 52 (05/11 1030) Resp:  [12-23] 16 (05/11 1030) BP: (95-198)/(41-122) 154/73 mmHg (05/11 1030) SpO2:  [96 %-100 %] 100 % (05/11 1030) FiO2 (%):  [40 %] 40 % (05/11 1000) Weight:  [100.5 kg (221 lb 9 oz)] 100.5 kg (221 lb 9 oz) (05/11 0500) HEMODYNAMICS: CVP:  [1 mmHg-18 mmHg] 18 mmHg VENTILATOR SETTINGS: Vent Mode:  [-] PRVC FiO2 (%):  [40 %] 40 % Set Rate:  [16 bmp] 16 bmp Vt Set:  [510 mL] 510 mL PEEP:  [5 cmH20] 5 cmH20 Pressure Support:  [10 cmH20] 10 cmH20 Plateau Pressure:  [15 cmH20-16 cmH20] 16 cmH20 INTAKE / OUTPUT:  Intake/Output Summary (Last 24 hours) at 02/12/15 1135 Last data filed at 02/12/15 1000  Gross per 24 hour  Intake 1846.83 ml  Output   2050 ml  Net -203.17 ml    PHYSICAL EXAMINATION: General:  Acutely ill appearing, vented Neuro:  rass -1 HEENT:  [place]/AT, PERRL, jvd low normal Cardiovascular:  RRR, Nl S1/S2,  -M/R/G. Lungs:  ronchi resolved, clear mosltyl coarse Abdomen:  Soft, NT, ND and +BS. Musculoskeletal:  Limited edema Skin:  Intact.  LABS:  CBC  Recent Labs Lab 02/10/15 0227 02/11/15 0438 02/12/15 0420  WBC 23.7* 18.1* 15.6*  HGB 15.6 13.9 12.3*  HCT 44.6 40.9 38.2*  PLT 180 174 153   Coag's  Recent Labs Lab 02/08/15 1823  APTT 27  INR 1.07   BMET  Recent Labs Lab 02/10/15 1055  02/11/15 0438  02/12/15 0145 02/12/15 0420 02/12/15 0817  NA 132*  < > 141  < > 151* 152* 154*  K 2.8*  --  3.6  --   --  3.9  --   CL 98*  --  109  --   --  118*  --   CO2 24  --  24  --   --  26  --   BUN 14  --  20  --   --  26*  --   CREATININE 0.96  --  1.09  --   --  1.05  --   GLUCOSE 141*  --  141*  --   --  135*  --   < > = values in this interval not displayed. Electrolytes  Recent Labs Lab 02/10/15 1055 02/11/15 0438 02/12/15 0420  CALCIUM 7.8* 7.6* 7.6*  MG  --  1.8  --   PHOS  --  2.5  --    Sepsis Markers No results for input(s): LATICACIDVEN, PROCALCITON, O2SATVEN in the last 168 hours. ABG  Recent Labs Lab 02/10/15 1229 02/11/15 0330  PHART 7.413 7.398  PCO2ART 39.0 39.5  PO2ART 334.0* 126*   Liver Enzymes  Recent Labs Lab 02/08/15 1823 02/12/15 0420  AST 28 25  ALT 25 27  ALKPHOS 54 44  BILITOT 0.6 0.5  ALBUMIN 3.7 2.4*   Cardiac Enzymes  Recent Labs Lab 02/11/15 2345 02/12/15 0420  TROPONINI 0.10* 0.06*   Glucose  Recent Labs Lab 02/11/15 1213 02/11/15 1607 02/11/15 1942 02/12/15 0023 02/12/15 0453 02/12/15 0816  GLUCAP 113* 116* 98 117* 102* 110*    Imaging Mr Mra Head Wo Contrast  02/11/2015   CLINICAL DATA:  Intracranial hemorrhage hypertension and diabetes.  EXAM: MRI HEAD WITHOUT CONTRAST  MRA HEAD WITHOUT CONTRAST  TECHNIQUE: Multiplanar, multiecho pulse sequences of the brain and surrounding structures were obtained without intravenous contrast. Angiographic images of the head were obtained using MRA technique  without contrast.  COMPARISON:  CT head 02/10/2015  FINDINGS: MRI HEAD FINDINGS  Left occipital hematoma measures 7.3 x 3.3 cm similar to the CT. Mild intraventricular hemorrhage is present with hematocrit level in the occipital horn bilaterally. Mass-effect on the left occipital horn due to the hematoma. Mild subarachnoid hemorrhage bilaterally. Subarachnoid hemorrhage causes hyperintense signal on diffusion but I do not believe there is acute ischemic infarction present.  Ventricle size normal. 3 mm midline shift to the right due to mass-effect. No underlying neoplasm on unenhanced images.  Mucosal edema in the paranasal sinuses bilaterally without air-fluid level. Mild mucosal edema mastoid sinus bilaterally.  MRA HEAD FINDINGS  Both vertebral arteries patent to the basilar. Left PICA patent. Right PICA not visualized. Basilar widely patent. Bilateral AICA patent. Bilateral superior cerebellar and posterior cerebral arteries widely patent  Internal carotid artery normal bilaterally. Anterior and middle cerebral arteries normal bilaterally  Negative for aneurysm or vascular malformation.  IMPRESSION: 7.3 x 3.3 cm left occipital hematoma unchanged from the prior CT. There is intraventricular hemorrhage and subarachnoid hemorrhage also unchanged. There is mass-effect on the left occipital horn and 3 mm midline shift to the right. No evidence of underlying tumor or vascular malformation. This may be related to cerebral amyloid angiography. Hypertension also possible.  Negative MRA circle Willis.   Electronically Signed   By: Marlan Palau M.D.   On: 02/11/2015 19:09   Mr Brain Wo Contrast  02/11/2015   CLINICAL DATA:  Intracranial hemorrhage hypertension and diabetes.  EXAM: MRI HEAD WITHOUT CONTRAST  MRA HEAD WITHOUT CONTRAST  TECHNIQUE: Multiplanar, multiecho pulse sequences of the brain and surrounding structures were obtained without intravenous contrast. Angiographic images of the head were obtained using  MRA technique without contrast.  COMPARISON:  CT head 02/10/2015  FINDINGS: MRI HEAD FINDINGS  Left occipital hematoma measures 7.3 x 3.3 cm similar to the CT. Mild intraventricular hemorrhage is present with hematocrit level in the occipital horn bilaterally. Mass-effect on the left occipital horn due to the hematoma. Mild subarachnoid hemorrhage bilaterally. Subarachnoid hemorrhage causes hyperintense signal on diffusion but I do not believe there is acute ischemic infarction present.  Ventricle size normal. 3 mm midline shift to the right due to mass-effect. No underlying neoplasm on unenhanced images.  Mucosal edema in the paranasal sinuses bilaterally without air-fluid level. Mild mucosal edema mastoid sinus bilaterally.  MRA HEAD FINDINGS  Both vertebral arteries patent to the basilar. Left PICA patent. Right PICA not  visualized. Basilar widely patent. Bilateral AICA patent. Bilateral superior cerebellar and posterior cerebral arteries widely patent  Internal carotid artery normal bilaterally. Anterior and middle cerebral arteries normal bilaterally  Negative for aneurysm or vascular malformation.  IMPRESSION: 7.3 x 3.3 cm left occipital hematoma unchanged from the prior CT. There is intraventricular hemorrhage and subarachnoid hemorrhage also unchanged. There is mass-effect on the left occipital horn and 3 mm midline shift to the right. No evidence of underlying tumor or vascular malformation. This may be related to cerebral amyloid angiography. Hypertension also possible.  Negative MRA circle Willis.   Electronically Signed   By: Marlan Palauharles  Clark M.D.   On: 02/11/2015 19:09   Dg Chest Port 1 View  02/11/2015   CLINICAL DATA:  Check endotracheal tube placement  EXAM: PORTABLE CHEST - 1 VIEW  COMPARISON:  02/10/2015  FINDINGS: Cardiac shadow is stable. A left-sided jugular line, nasogastric catheter and endotracheal tube are all noted in satisfactory position. No pneumothorax is identified. No focal confluent  infiltrate is seen. No bony abnormality is noted  IMPRESSION: Tubes and lines as described.  No acute abnormality noted.   Electronically Signed   By: Alcide CleverMark  Lukens M.D.   On: 02/11/2015 08:06     ASSESSMENT / PLAN:  PULMONARY OETT 5/9>>> A: VDRF due to inability to protect his airways. ATX, LLL ? Mucus plug>? P:   - Full vent support. -concern for LL atx, consider some small peep elevation, chest pt, mucocyst and repeat pcxr in am  - no role bronch unless clinically desats -even balance goals, not neg -sbt attempted, failed, limited benefit of further with pcxr findings  CARDIOVASCULAR CVL L IJ TLC 5/9>>> A: HTN in setting ICH P:  - cvp noted -consider sys 150, clividine off but need additional agents -hydral oral added, added IV as some brady , cant max BB -echo awaited  RENAL A:  Hyponatremia, hypokalemia and hypochloremia, elevated urine output ( iatrogenic? At risk DI, CSW) P:   - 3% saline per neuro - at goal - BMET in AM. - avoid free water -would not use lasix with 3%, was neg again, obtain urine osm, na (may be difficult to determine on 3%)  GASTROINTESTINAL A:  Nutrition needs P:   - PPI. -TF to goal -add colace, mylax in am if no BM  HEMATOLOGIC A:  DVT prevention P:  - CBC in AM. - Transfusion per ICU protocol. -consider day 5 start sub q hep, per neuro  INFECTIOUS A: r/o LLL PNA, from aspiration pre ETT P:   5/11 sputum>>>  -unasyn 5/11>>> -If spike, add vanc -pcxr to follow  ENDOCRINE A:  No diabetes by history.   P:   - Monitor glu am   NEUROLOGIC A:  ICH, unable to protect his airway. P:   RASS goal: 0 - 3% saline improved to goals range - Propofol for sedation. - PRN fentanyl. - Neurology following -hob elevated  FAMILY  - Updates: No family bedside.  Ccm time 30 min   Mcarthur Rossettianiel J. Tyson AliasFeinstein, MD, FACP Pgr: [phone] [facility]

## 2015-02-12 NOTE — Progress Notes (Signed)
eLink Physician-Brief Progress Note Patient Name: Austin Sawyer DOB: 11/16/1934 MRN: [id number]   Date of Service  02/12/2015  HPI/Events of Note  Troponin slightly positive EKG with LVH and TWI lateral limb leads, either ischemia vs repolarization abnormalities  eICU Interventions  Hold ASA/anticoaguation given ICH and poor prognosis     Intervention Category Intermediate Interventions: Diagnostic test evaluation  MCQUAID, DOUGLAS 02/12/2015, 1:04 AM

## 2015-02-12 NOTE — Progress Notes (Signed)
PT Cancellation Note  Patient Details Name: Austin Sawyer MRN: [id number] DOB: 05/15/1935   Cancelled Treatment:    Reason Eval/Treat Not Completed: Patient not medically ready.  On vent, moves all 4, but not ready. 02/12/2015  [place] BingKen Dublin Grayer, PT [phone]  (pager)   [place]illiary Jock, [name] 02/12/2015, 10:20 AM

## 2015-02-12 NOTE — Progress Notes (Signed)
OT Cancellation Note  Patient Details Name: Austin Sawyer MRN: [id number] DOB: 11/13/1934   Cancelled Treatment:    Reason Eval/Treat Not Completed: Patient not medically ready  [facility],HILLARY  Jhovany Weidinger, OTR/L  [phone] 02/12/2015 02/12/2015, 8:18 AM

## 2015-02-12 NOTE — Progress Notes (Signed)
UR completed. 3% saline gtt continues and pt remains on vent. CM continuing to follow.  Carlyle LipaMichelle Zariyah Stephens, RN BSN [facility] CCM Trauma/Neuro ICU Case Manager [phone]

## 2015-02-13 ENCOUNTER — Inpatient Hospital Stay (HOSPITAL_COMMUNITY): Payer: Medicare Other

## 2015-02-13 LAB — CBC WITH DIFFERENTIAL/PLATELET
BASOS PCT: 0 % (ref 0–1)
Basophils Absolute: 0 10*3/uL (ref 0.0–0.1)
EOS PCT: 2 % (ref 0–5)
Eosinophils Absolute: 0.2 10*3/uL (ref 0.0–0.7)
HCT: 37.1 % — ABNORMAL LOW (ref 39.0–52.0)
HEMOGLOBIN: 11.8 g/dL — AB (ref 13.0–17.0)
LYMPHS ABS: 1.2 10*3/uL (ref 0.7–4.0)
Lymphocytes Relative: 9 % — ABNORMAL LOW (ref 12–46)
MCH: 28.6 pg (ref 26.0–34.0)
MCHC: 31.8 g/dL (ref 30.0–36.0)
MCV: 89.8 fL (ref 78.0–100.0)
MONOS PCT: 11 % (ref 3–12)
Monocytes Absolute: 1.4 10*3/uL — ABNORMAL HIGH (ref 0.1–1.0)
NEUTROS PCT: 78 % — AB (ref 43–77)
Neutro Abs: 10.2 10*3/uL — ABNORMAL HIGH (ref 1.7–7.7)
PLATELETS: 158 10*3/uL (ref 150–400)
RBC: 4.13 MIL/uL — AB (ref 4.22–5.81)
RDW: 15.6 % — ABNORMAL HIGH (ref 11.5–15.5)
WBC: 13 10*3/uL — ABNORMAL HIGH (ref 4.0–10.5)

## 2015-02-13 LAB — COMPREHENSIVE METABOLIC PANEL
ALK PHOS: 44 U/L (ref 38–126)
ALT: 31 U/L (ref 17–63)
AST: 24 U/L (ref 15–41)
Albumin: 2.3 g/dL — ABNORMAL LOW (ref 3.5–5.0)
Anion gap: 10 (ref 5–15)
BILIRUBIN TOTAL: 0.5 mg/dL (ref 0.3–1.2)
BUN: 31 mg/dL — AB (ref 6–20)
CHLORIDE: 122 mmol/L — AB (ref 101–111)
CO2: 27 mmol/L (ref 22–32)
Calcium: 8 mg/dL — ABNORMAL LOW (ref 8.9–10.3)
Creatinine, Ser: 1.13 mg/dL (ref 0.61–1.24)
GLUCOSE: 118 mg/dL — AB (ref 65–99)
POTASSIUM: 3.4 mmol/L — AB (ref 3.5–5.1)
Sodium: 159 mmol/L — ABNORMAL HIGH (ref 135–145)
Total Protein: 6 g/dL — ABNORMAL LOW (ref 6.5–8.1)

## 2015-02-13 LAB — GLUCOSE, CAPILLARY
GLUCOSE-CAPILLARY: 104 mg/dL — AB (ref 65–99)
GLUCOSE-CAPILLARY: 107 mg/dL — AB (ref 65–99)
GLUCOSE-CAPILLARY: 110 mg/dL — AB (ref 65–99)
Glucose-Capillary: 108 mg/dL — ABNORMAL HIGH (ref 65–99)
Glucose-Capillary: 99 mg/dL (ref 65–99)
Glucose-Capillary: 133 mg/dL — ABNORMAL HIGH (ref 65–99)

## 2015-02-13 LAB — SODIUM
SODIUM: 160 mmol/L — AB (ref 135–145)
Sodium: 159 mmol/L — ABNORMAL HIGH (ref 135–145)
Sodium: 159 mmol/L — ABNORMAL HIGH (ref 135–145)
Sodium: 157 mmol/L — ABNORMAL HIGH (ref 135–145)

## 2015-02-13 LAB — CULTURE, RESPIRATORY W GRAM STAIN: Gram Stain: NONE SEEN

## 2015-02-13 LAB — CULTURE, RESPIRATORY

## 2015-02-13 MED ORDER — POLYETHYLENE GLYCOL 3350 17 G PO PACK
17.0000 g | PACK | Freq: Every day | ORAL | Status: DC
  Administered 2015-02-13 – 2015-02-21 (×3): 17 g via ORAL
  Filled 2015-02-13 (×9): qty 1

## 2015-02-13 MED ORDER — HYDRALAZINE HCL 50 MG PO TABS
75.0000 mg | ORAL_TABLET | Freq: Three times a day (TID) | ORAL | Status: DC
  Administered 2015-02-13 – 2015-02-14 (×3): 75 mg
  Filled 2015-02-13 (×6): qty 1

## 2015-02-13 MED ORDER — POTASSIUM CHLORIDE 20 MEQ/15ML (10%) PO SOLN
40.0000 meq | ORAL | Status: AC
  Administered 2015-02-13 (×2): 40 meq via ORAL
  Filled 2015-02-13 (×2): qty 30

## 2015-02-13 MED ORDER — HYDRALAZINE HCL 20 MG/ML IJ SOLN
20.0000 mg | INTRAMUSCULAR | Status: DC | PRN
  Administered 2015-02-13 – 2015-02-14 (×4): 20 mg via INTRAVENOUS
  Filled 2015-02-13 (×4): qty 1

## 2015-02-13 MED ORDER — POTASSIUM CHLORIDE CRYS ER 20 MEQ PO TBCR: 40.0000 meq | EXTENDED_RELEASE_TABLET | ORAL | Status: DC

## 2015-02-13 NOTE — Progress Notes (Signed)
OT Cancellation Note  Patient Details Name: Austin Sawyer MRN: [id number] DOB: 09/01/1935   Cancelled Treatment:    Reason Eval/Treat Not Completed: Patient not medically ready OT signing off. Please reorder when appropriate. thanks [facility]  Austin Sawyer, OTR/L  [phone] 02/13/2015 02/13/2015, 10:58 AM

## 2015-02-13 NOTE — Progress Notes (Signed)
PULMONARY / CRITICAL CARE MEDICINE   Name: Austin Sawyer MRN: [id number] DOB: 06/01/1935    ADMISSION DATE:  02/08/2015 CONSULTATION DATE:  02/10/2015  REFERRING MD :  Neurology  CHIEF COMPLAINT:  ICH, headache and vomiting  INITIAL PRESENTATION: 80 year old male with PMH of HTN presenting with ICH on 5/7.  On 5/9 patient decompensated and was unable to protect his airway.  PCCM was called on consultation for intubation and TLC placement for 3% saline.  STUDIES:  5/9 repeat head CT with edema and ICH that was stable. 5/10 mri brain - 7.3 x 3.3 cm left occipital hematoma unchanged from the prior CT. There is intraventricular hemorrhage and subarachnoid hemorrhage also unchanged. There is mass-effect on the left occipital horn and 3 mm midline shift to the right.  5/11 - echo - 55% , mild AS by gradient only  SIGNIFICANT EVENTS: 5/7 ICH on CT and AMS. 5/9 Unable to protect his airway and PCCM consulted. 5/10- remains HTN  SUBJECTIVE: peep 8 for atx  VITAL SIGNS: Temp:  [98 F (36.7 C)-100.9 F (38.3 C)] 98 F (36.7 C) (05/12 0400) Pulse Rate:  [52-97] 64 (05/12 0830) Resp:  [12-27] 16 (05/12 0830) BP: (109-195)/(49-86) 154/59 mmHg (05/12 0830) SpO2:  [98 %-100 %] 99 % (05/12 0830) FiO2 (%):  [40 %] 40 % (05/12 0830) Weight:  [100.7 kg (222 lb 0.1 oz)] 100.7 kg (222 lb 0.1 oz) (05/12 0500) HEMODYNAMICS: CVP:  [3 mmHg-20 mmHg] 11 mmHg VENTILATOR SETTINGS: Vent Mode:  [-] PRVC FiO2 (%):  [40 %] 40 % Set Rate:  [16 bmp] 16 bmp Vt Set:  [510 mL] 510 mL PEEP:  [5 cmH20-8 cmH20] 8 cmH20 Plateau Pressure:  [15 cmH20-23 cmH20] 23 cmH20 INTAKE / OUTPUT:  Intake/Output Summary (Last 24 hours) at 02/13/15 0845 Last data filed at 02/13/15 [id number]  Gross per 24 hour  Intake 2060.43 ml  Output   2860 ml  Net -799.57 ml    PHYSICAL EXAMINATION: General:  Acutely ill appearing, vented Neuro:  rass -1, more awake, awakens easier HEENT:  jvd wnl Cardiovascular:  RRR, Nl S1/S2,  -M/R/G. Lungs:  ronchi  Abdomen:  Soft, NT, ND and +BS. Musculoskeletal:  Mild edema Skin:  Intact.  LABS:  CBC  Recent Labs Lab 02/11/15 0438 02/12/15 0420 02/13/15 0320  WBC 18.1* 15.6* 13.0*  HGB 13.9 12.3* 11.8*  HCT 40.9 38.2* 37.1*  PLT 174 153 158   Coag's  Recent Labs Lab 02/08/15 1823  APTT 27  INR 1.07   BMET  Recent Labs Lab 02/11/15 0438  02/12/15 0420  02/12/15 1430 02/12/15 2045 02/13/15 0320  NA 141  < > 152*  < > 160* 156* 159*  159*  K 3.6  --  3.9  --   --   --  3.4*  CL 109  --  118*  --   --   --  122*  CO2 24  --  26  --   --   --  27  BUN 20  --  26*  --   --   --  31*  CREATININE 1.09  --  1.05  --   --   --  1.13  GLUCOSE 141*  --  135*  --   --   --  118*  < > = values in this interval not displayed. Electrolytes  Recent Labs Lab 02/11/15 0438 02/12/15 0420 02/13/15 0320  CALCIUM 7.6* 7.6* 8.0*  MG 1.8  --   --  PHOS 2.5  --   --    Sepsis Markers No results for input(s): LATICACIDVEN, PROCALCITON, O2SATVEN in the last 168 hours. ABG  Recent Labs Lab 02/10/15 1229 02/11/15 0330  PHART 7.413 7.398  PCO2ART 39.0 39.5  PO2ART 334.0* 126*   Liver Enzymes  Recent Labs Lab 02/08/15 1823 02/12/15 0420 02/13/15 0320  AST 28 25 24   ALT 25 27 31   ALKPHOS 54 44 44  BILITOT 0.6 0.5 0.5  ALBUMIN 3.7 2.4* 2.3*   Cardiac Enzymes  Recent Labs Lab 02/11/15 2345 02/12/15 0420 02/12/15 1000  TROPONINI 0.10* 0.06* <0.03   Glucose  Recent Labs Lab 02/12/15 1316 02/12/15 1701 02/12/15 2032 02/13/15 0008 02/13/15 0402 02/13/15 0741  GLUCAP 88 109* 103* 104* 110* 108*    Imaging Dg Chest Port 1 View  02/12/2015   CLINICAL DATA:  Atelectasis.  EXAM: PORTABLE CHEST - 1 VIEW  COMPARISON:  One-view chest 02/11/2015  FINDINGS: Endotracheal tube, left IJ line, and NG tube are stable. The heart size is normal. Bibasilar airspace disease persist, left greater than right. Pulmonary vascular congestion is improved. A  left pleural effusion is suspected.  IMPRESSION: 1. Support apparatus is stable. 2. Decreased pulmonary vascular congestion. 3. Persistent bibasilar airspace disease, left greater than right.   Electronically Signed   By: Marin Robertshristopher  Mattern M.D.   On: 02/12/2015 08:08     ASSESSMENT / PLAN:  PULMONARY OETT 5/9>>> A: VDRF due to inability to protect his airways. ATX, LLL ? Mucus plug>? P:   - unchanged overall ATX left base, minimal secretions overall pcxr in am  Keep peep 8 Wean cpap 8 PS 5, if fails will consider bronch LLL as would be a contributer chest pt mucomysts x 24 hr further Allow even balance to neg  CARDIOVASCULAR CVL L IJ TLC 5/9>>> A: HTN in setting ICH P:  -consider sys 150, in am further -hydral oral added, will increase, using prn IV hydralazine -echo reviewed  RENAL A:  Hyponatremia, hypokalemia and hypochloremia, elevated urine output ( iatrogenic? At risk DI, CSW) P:   - 3% off -na in range -k supp -Chem in am   GASTROINTESTINAL A:  Nutrition needs P:   - PPI. -TF to goal -add mirlalax  HEMATOLOGIC A:  DVT prevention P:  - CBC in AM  INFECTIOUS A: r/o LLL PNA, from aspiration pre ETT P:   5/11 sputum>>>few candida (not a pathogen)  -unasyn 5/11>>> -pcxr to follow left base further  ENDOCRINE A:  No diabetes by history.   P:   - Monitor glu am   NEUROLOGIC A:  ICH, unable to protect his airway. P:   RASS goal: 0 - Propofol for sedation, WUA - PRN fentanyl. -hob elevated  FAMILY  - Updates: No family bedside.  Ccm time 30 min   Mcarthur Rossettianiel J. Tyson AliasFeinstein, MD, FACP Pgr: [phone] [facility]

## 2015-02-13 NOTE — Progress Notes (Signed)
Chaplain responded to request from nurse to visit pt family. Pt wife currently in 4N. Chaplain spoke at length with pt daughter, [name]. Pt daughter cares very much for her father, and thinks her mothers illness is due to her father's decline. Stating, "She depended on him for everything." Pt is [employer] and pt daughter is not. Church and daughter seem to have different views on pt care. Chaplain provided emotional support and offered to keep pt family in her prayers. Chaplain will continue to follow.   02/13/15 1700  Clinical Encounter Type  Visited With Family  Visit Type Initial;Spiritual support  Referral From Nurse  Spiritual Encounters  Spiritual Needs Emotional;Prayer  Stress Factors  Family Stress Factors Major life changes  Jiles HaroldStamey, Clancy Leiner F, Chaplain 02/13/2015 5:46 PM

## 2015-02-13 NOTE — Progress Notes (Signed)
STROKE TEAM PROGRESS NOTE   SUBJECTIVE (INTERVAL HISTORY) No family is at the bedside. He was still intubated and sedated. Na 159, 3% saline discontinued. His BP better controlled on po meds.   OBJECTIVE Temp:  [98 F (36.7 C)-100.9 F (38.3 C)] 98 F (36.7 C) (05/12 0400) Pulse Rate:  [52-97] 64 (05/12 0830) Cardiac Rhythm:  [-] Sinus bradycardia (05/12 0600) Resp:  [12-27] 16 (05/12 0830) BP: (109-195)/(49-86) 154/59 mmHg (05/12 0830) SpO2:  [98 %-100 %] 99 % (05/12 0830) FiO2 (%):  [40 %] 40 % (05/12 0830) Weight:  [222 lb 0.1 oz (100.7 kg)] 222 lb 0.1 oz (100.7 kg) (05/12 0500)   Recent Labs Lab 02/12/15 1701 02/12/15 2032 02/13/15 0008 02/13/15 0402 02/13/15 0741  GLUCAP 109* 103* 104* 110* 108*    Recent Labs Lab 02/10/15 0227 02/10/15 1055  02/11/15 0438  02/12/15 0420 02/12/15 0817 02/12/15 1430 02/12/15 2045 02/13/15 0320  NA 131* 132*  < > 141  < > 152* 154* 160* 156* 159*  159*  K 3.2* 2.8*  --  3.6  --  3.9  --   --   --  3.4*  CL 98* 98*  --  109  --  118*  --   --   --  122*  CO2 22 24  --  24  --  26  --   --   --  27  GLUCOSE 170* 141*  --  141*  --  135*  --   --   --  118*  BUN 13 14  --  20  --  26*  --   --   --  31*  CREATININE 0.84 0.96  --  1.09  --  1.05  --   --   --  1.13  CALCIUM 8.1* 7.8*  --  7.6*  --  7.6*  --   --   --  8.0*  MG  --   --   --  1.8  --   --   --   --   --   --   PHOS  --   --   --  2.5  --   --   --   --   --   --   < > = values in this interval not displayed.  Recent Labs Lab 02/08/15 1823 02/12/15 0420 02/13/15 0320  AST 28 25 24   ALT 25 27 31   ALKPHOS 54 44 44  BILITOT 0.6 0.5 0.5  PROT 6.8 5.8* 6.0*  ALBUMIN 3.7 2.4* 2.3*    Recent Labs Lab 02/08/15 1823  02/09/15 0745 02/10/15 0227 02/11/15 0438 02/12/15 0420 02/13/15 0320  WBC 10.2  --  18.4* 23.7* 18.1* 15.6* 13.0*  NEUTROABS 7.3  --   --   --   --  12.8* 10.2*  HGB 13.0  < > 14.3 15.6 13.9 12.3* 11.8*  HCT 39.8  < > 42.6 44.6 40.9 38.2*  37.1*  MCV 89.0  --  86.6 83.8 85.9 88.6 89.8  PLT 166  --  180 180 174 153 158  < > = values in this interval not displayed.  Recent Labs Lab 02/11/15 2345 02/12/15 0420 02/12/15 1000  TROPONINI 0.10* 0.06* <0.03   No results for input(s): LABPROT, INR in the last 72 hours. No results for input(s): COLORURINE, LABSPEC, PHURINE, GLUCOSEU, HGBUR, BILIRUBINUR, KETONESUR, PROTEINUR, UROBILINOGEN, NITRITE, LEUKOCYTESUR in the last 72 hours.  Invalid input(s): APPERANCEUR     Component Value  Date/Time   CHOL 178 02/09/2015 0745   TRIG 121 02/12/2015 1141   HDL 62 02/09/2015 0745   CHOLHDL 2.9 02/09/2015 0745   VLDL 9 02/09/2015 0745   LDLCALC 107* 02/09/2015 0745   Lab Results  Component Value Date   HGBA1C 6.0* 02/09/2015      Component Value Date/Time   LABOPIA NONE DETECTED 02/08/2015 1823   COCAINSCRNUR NONE DETECTED 02/08/2015 1823   LABBENZ NONE DETECTED 02/08/2015 1823   AMPHETMU NONE DETECTED 02/08/2015 1823   THCU NONE DETECTED 02/08/2015 1823   LABBARB NONE DETECTED 02/08/2015 1823     Recent Labs Lab 02/08/15 1826  ETH <5    I have personally reviewed the radiological images below and agree with the radiology interpretations.  Ct Head Wo Contrast 02/10/2015 - 1. No significant interval change in size of left occipital lobe intraparenchymal hematoma with slightly increased associated vasogenic edema as compared to previous. Similar trace left-to-right shift. 2. Stable to slightly decreased conspicuity of bilateral small volume subarachnoid hemorrhage. 3. Stable intraventricular hemorrhage. No hydrocephalus. 4. Persistent left subdural hematoma measuring up to 3.5 mm in maximal diameter. Blood along the left tentorium is decreased in conspicuity. 5. No new intracranial abnormality.  02/09/2015   IMPRESSION: 1. Slight enlargement of large left occipital lobe intra-axial hemorrhage. Estimated hemorrhage volume now 58 mm. Stable left posterior hemisphere edema and  mass effect. 2. Mild progression of subarachnoid extension, now bilateral. Extension and of a left subdural space with small broad-based left subdural hematoma not significantly changed. Stable intraventricular hemorrhage volume. No ventriculomegaly. 3. No new intracranial abnormality identified.      02/08/2015   IMPRESSION: 1. 6.7 cm left occipital parenchymal hemorrhage with mild surrounding edema. No midline shift. 2. Small amount of adjacent subarachnoid hemorrhage and small amount of intraventricular extension of blood. 3. Small subdural hematomas over the left cerebral convexity and left tentorium.   Dg Chest Port 1 View 02/13/15 1. Stable support apparatus. 2. Improved basilar atelectasis. 02/10/15 Tube and catheter positions as described without pneumothorax. No edema or consolidation. 02/09/2015    IMPRESSION: No acute cardiopulmonary disease.     Carotid Doppler  No evidence of significant stenosis in the right or left ICA. The left side was technically challenged due to line placement.   2D Echocardiogram  Left ventricle: The cavity size was normal. Wall thickness was increased in a pattern of moderate LVH. Systolic function was normal. The estimated ejection fraction was in the range of 55% to 60%. Doppler parameters are consistent with elevated ventricular end-diastolic filling pressure. - Aortic valve: Poorly seen Moderately calcified mild stenosis by gradients but may be underestimated due to poor CW angle. Valve area (VTI): 2.11 cm^2. Valve area (Vmax): 2.01 cm^2. Valve area (Vmean): 1.88 cm^2. - Mitral valve: There was mild regurgitation. - Left atrium: The atrium was moderately dilated. - Impressions: Cannot r/o SOE due to extremely poor image quality. Impressions: - Cannot r/o SOE due to extremely poor image quality.  MRI and MRA  7.3 x 3.3 cm left occipital hematoma unchanged from the prior CT. There is intraventricular hemorrhage and subarachnoid  hemorrhage also unchanged. There is mass-effect on the left occipital horn and 3 mm midline shift to the right. No evidence of underlying tumor or vascular malformation. This may be related to cerebral amyloid angiography. Hypertension also possible. Negative MRA circle Willis.  EEG This EEG is abnormal with moderately severe generalized continuous nonspecific slowing of cerebral activity. No evidence of epileptic activity was recorded. The  absence of epileptiform activity during an EEG recording does not, in and of itself, rule out seizure disorder, however.  EKG  NSR  PHYSICAL EXAM  Temp:  [98 F (36.7 C)-100.9 F (38.3 C)] 98 F (36.7 C) (05/12 0400) Pulse Rate:  [52-97] 64 (05/12 0830) Resp:  [12-27] 16 (05/12 0830) BP: (109-195)/(49-86) 154/59 mmHg (05/12 0830) SpO2:  [98 %-100 %] 99 % (05/12 0830) FiO2 (%):  [40 %] 40 % (05/12 0830) Weight:  [222 lb 0.1 oz (100.7 kg)] 222 lb 0.1 oz (100.7 kg) (05/12 0500)  General - Well nourished, well developed, intubated and sedated but open eyes on voice.  Ophthalmologic - fundi not visualized due to incorporation.  Cardiovascular - Regular rate and rhythm.  Neuro -  Intubated and sedated, open eyes on pain stimulation but not able to keep eyes open without constant stimulation, not following commands. Neglect to the right, not blinking to visual threat to the right. eyes in the middle position. PERRL, corneal and gag and cough reflex present. Moving all extremities, however difficult to precisely evaluate due to not following commands. LUE localizing to pain but RUE 1/5 on pain, BLE withdraw at least 2+/5 on pain stimulation. Reflexes were 1+ in all extremities and he had no pathological reflexes.  ASSESSMENT/PLAN Mr. Shay Bartoli is a 80 y.o. male with history of HTN, DM, CAD, a flutter on Coumadin before until 2010, alcohol abuse admitted for headache, nausea vomiting, confusion, blurred vision. CT had showed left occipital ICH with  small SAH, SDH and IVH. Symptoms and changed.    Left occipital ICH with small SAH, SDH and IVH - etiology unclear, HTN versus CAA  MRI  Stable ICH, SDH and IVH - no new bleeding and minimal midline shift   MRA  unremarkable   Repeat CT showed stable hematoma with slight extension of SAH and SDH, stable IVH  Carotid Doppler  unremarkable  2D Echo  EF 55-60%  EEG no seizure  LDL 107, not at goal  HgbA1c 6.0  heparin subq for VTE prophylaxis  Diet NPO time specified   no antithrombotic prior to admission, now on no antithrombotic due to ICH  Ongoing aggressive stroke risk factor management  Therapy recommendations:  Pending  Disposition:  Pending  Respiratory failure  Intubated on sedation  CXR did not show CHF and improved LLL infiltrates.  CCM following  Extubate as able  Hyponatremia  Concerning for SIADH vs. salt wasting  Sodium from 143-> 131 -> 138 -> 145->154->159  Off 3% saline  Na goal 150-160  Diabetes  HbA1c 6.0, goal < 7.0  Controlled  CBG monitoring  SSI  Hypertension  Home meds:  Coreg, losartan, BP goal < 160 Currently on hydralazine and metoprolol  Better controlled  Close monitoring  claviprex in needed   Hyperlipidemia  Home meds:   Vytorin  LDL 107, goal < 70  Statin on hold now due to ICH  Continue statin at discharge  A. flutter  Patient history of a flutter  Was on Coumadin in the past  no anticoagulation or antiplatelet PTA  No a flutter on telemetry  Patient may not be a candidate for anticoagulation  Other Stroke Risk Factors  Advanced age  ETOH use  Obesity, Body mass index is 35.85 kg/(m^2).   Other Active Problems  Mild hypokalemia  Leukocytosis  Other Pertinent History    Hospital day # 5  This patient is critically ill due to respiratory failure, large left occipital ICH with [facility]  and IVH, hyponatremia and at significant risk of neurological worsening, death form respiratory  failure, rebleeding, cerebral edema, brain herniation, and status epilepticus. This patient's care requires constant monitoring of vital signs, hemodynamics, respiratory and cardiac monitoring, review of multiple databases, neurological assessment, discussion with family, other specialists and medical decision making of high complexity. I spent 35 minutes of neurocritical care time in the care of this patient.  Marvel Plan, MD PhD Stroke Neurology 02/13/2015 8:46 AM    To contact Stroke Continuity provider, please refer to [email or web address]. After hours, contact General Neurology

## 2015-02-13 NOTE — Progress Notes (Signed)
Nutrition Follow-up  DOCUMENTATION CODES:  Obesity unspecified  INTERVENTION:  Tube feeding, Prostat  Decrease Vital High Protein to goal of 20 ml/hr via NG tube  60 ml Prostat TID.   Tube feeding regimen provides 1080 kcal, 132 grams of protein, and 401 ml of H2O.   TF regimen and propofol at current rate providing 1483 total kcal/day (14 kcal/kg of actual body weight)   NUTRITION DIAGNOSIS:  Inadequate oral intake related to inability to eat as evidenced by NPO status.  Ongoing.   GOAL:  Provide needs based on ASPEN/SCCM guidelines  Met.   MONITOR:  TF tolerance, Vent status, Labs  REASON FOR ASSESSMENT:  Consult Enteral/tube feeding initiation and management  ASSESSMENT:  Pt admitted as a code stroke, ICH on CT.  Pt decompensated 5/9 and intubated.   Patient is currently intubated on ventilator support MV: 8 L/min Temp (24hrs), Avg:99.5 F (37.5 C), Min:98 F (36.7 C), Max:100.9 F (38.3 C)  Propofol: 15.3 ml/hr provides 403 kcal per day from lipid  Pt discussed during ICU rounds and with RN.  Sister at bedside.   Medications reviewed and include: senokot-s, miralax, KCl Labs reviewed:  Na 160, pt on 3% Potassium low on repletion   Height:  Ht Readings from Last 1 Encounters:  02/08/15 _0  (1.676 m)    Weight:  Wt Readings from Last 1 Encounters:  02/13/15 222 lb 0.1 oz (100.7 kg)    Ideal Body Weight:  64.5 kg  Wt Readings from Last 10 Encounters:  02/13/15 222 lb 0.1 oz (100.7 kg)  03/15/14 227 lb (102.967 kg)  09/14/13 220 lb (99.791 kg)  07/12/13 217 lb (98.431 kg)  04/27/13 213 lb (96.616 kg)  04/22/13 220 lb (99.791 kg)  10/04/12 220 lb (99.791 kg)  06/23/12 219 lb (99.338 kg)  06/19/10 221 lb (100.245 kg)  12/09/09 240 lb (108.863 kg)    BMI:  Body mass index is 35.85 kg/(m^2).  Estimated Nutritional Needs:  Kcal:  0175-1025  Protein:  >/= 129 grams  Fluid:  > 1.5 L/day  Skin:  Reviewed, no  issues  Diet Order:  Diet NPO time specified  EDUCATION NEEDS:  No education needs identified at this time   Intake/Output Summary (Last 24 hours) at 02/13/15 1115 Last data filed at 02/13/15 1000  Gross per 24 hour  Intake 1843.33 ml  Output   3210 ml  Net -1366.67 ml    Last BM:  5/9 - small   [place], [place], [place] Pager [phone] After Hours Pager

## 2015-02-13 NOTE — Progress Notes (Signed)
PT Cancellation Note  Patient Details Name: Austin Sawyer MRN: [id number] DOB: 08/18/1935   Cancelled Treatment:    Reason Eval/Treat Not Completed: Patient still not medically ready.    02/13/2015  [place] Austin Sawyer, PT [phone]  (pager)   Austin Sawyer, Austin Sawyer 02/13/2015, 9:58 AM

## 2015-02-14 ENCOUNTER — Inpatient Hospital Stay (HOSPITAL_COMMUNITY): Payer: Medicare Other

## 2015-02-14 DIAGNOSIS — Z4659 Encounter for fitting and adjustment of other gastrointestinal appliance and device: Secondary | ICD-10-CM | POA: Insufficient documentation

## 2015-02-14 DIAGNOSIS — I612 Nontraumatic intracerebral hemorrhage in hemisphere, unspecified: Secondary | ICD-10-CM

## 2015-02-14 DIAGNOSIS — Z87898 Personal history of other specified conditions: Secondary | ICD-10-CM

## 2015-02-14 LAB — SODIUM
SODIUM: 156 mmol/L — AB (ref 135–145)
Sodium: 157 mmol/L — ABNORMAL HIGH (ref 135–145)
Sodium: 157 mmol/L — ABNORMAL HIGH (ref 135–145)

## 2015-02-14 LAB — BASIC METABOLIC PANEL
ANION GAP: 12 (ref 5–15)
BUN: 31 mg/dL — AB (ref 6–20)
CO2: 28 mmol/L (ref 22–32)
CREATININE: 1.13 mg/dL (ref 0.61–1.24)
Calcium: 8.4 mg/dL — ABNORMAL LOW (ref 8.9–10.3)
Chloride: 117 mmol/L — ABNORMAL HIGH (ref 101–111)
GLUCOSE: 139 mg/dL — AB (ref 65–99)
Potassium: 3.4 mmol/L — ABNORMAL LOW (ref 3.5–5.1)
Sodium: 157 mmol/L — ABNORMAL HIGH (ref 135–145)

## 2015-02-14 LAB — GLUCOSE, CAPILLARY
GLUCOSE-CAPILLARY: 108 mg/dL — AB (ref 65–99)
GLUCOSE-CAPILLARY: 121 mg/dL — AB (ref 65–99)
Glucose-Capillary: 116 mg/dL — ABNORMAL HIGH (ref 65–99)
Glucose-Capillary: 118 mg/dL — ABNORMAL HIGH (ref 65–99)
Glucose-Capillary: 119 mg/dL — ABNORMAL HIGH (ref 65–99)
Glucose-Capillary: 127 mg/dL — ABNORMAL HIGH (ref 65–99)

## 2015-02-14 MED ORDER — SODIUM CHLORIDE 0.9 % IV SOLN
INTRAVENOUS | Status: DC
  Administered 2015-02-14 – 2015-02-15 (×2): via INTRAVENOUS

## 2015-02-14 MED ORDER — HYDRALAZINE HCL 50 MG PO TABS
100.0000 mg | ORAL_TABLET | Freq: Three times a day (TID) | ORAL | Status: DC
  Filled 2015-02-14 (×6): qty 2

## 2015-02-14 MED ORDER — HYDRALAZINE HCL 20 MG/ML IJ SOLN
20.0000 mg | INTRAMUSCULAR | Status: DC | PRN
  Administered 2015-02-14 – 2015-02-16 (×3): 20 mg via INTRAVENOUS
  Filled 2015-02-14 (×3): qty 1

## 2015-02-14 MED ORDER — BISACODYL 10 MG RE SUPP: 10.0000 mg | Freq: Every day | RECTAL | Status: DC | PRN

## 2015-02-14 MED ORDER — POTASSIUM CHLORIDE 20 MEQ/15ML (10%) PO SOLN
40.0000 meq | Freq: Once | ORAL | Status: AC
  Administered 2015-02-14: 40 meq via ORAL
  Filled 2015-02-14: qty 30

## 2015-02-14 MED ORDER — METOPROLOL TARTRATE 25 MG PO TABS
75.0000 mg | ORAL_TABLET | Freq: Two times a day (BID) | ORAL | Status: DC
Start: 2015-02-14 — End: 2015-02-15
  Administered 2015-02-14: 75 mg via ORAL
  Filled 2015-02-14 (×4): qty 1

## 2015-02-14 NOTE — Procedures (Signed)
Extubation Procedure Note  Patient Details:   Name: Austin Sawyer Staff DOB: 10/13/1934 MRN: [id number]   Airway Documentation:  Airway 8 mm (Active)  Secured at (cm) 24 cm 02/14/2015 11:24 AM  Measured From Lips 02/14/2015 11:24 AM  Secured Location Center 02/14/2015 11:24 AM  Secured By [employer] Tube Holder 02/14/2015 11:24 AM  Tube Holder Repositioned Yes 02/14/2015 11:24 AM  Cuff Pressure (cm H2O) 22 cm H2O 02/13/2015  8:59 PM  Site Condition Dry 02/14/2015 11:24 AM    Evaluation  O2 sats: stable throughout Complications: No apparent complications Patient did tolerate procedure well. Bilateral Breath Sounds: Diminished Suctioning: Airway No  [place]a MoosCampbell, Auriah Hollings Faulkner 02/14/2015, 1:45 PM

## 2015-02-14 NOTE — Progress Notes (Signed)
STROKE TEAM PROGRESS NOTE   SUBJECTIVE (INTERVAL HISTORY) No family is at the bedside. He was still intubated and sedated. Na 156, 3% saline has been discontinued. His BP better controlled on po meds.  Still has thick secretions from ET tube. Plan to see if able to extubate today. Pt still agitated without propofol.  OBJECTIVE Temp:  [99.4 F (37.4 C)-101.4 F (38.6 C)] 99.7 F (37.6 C) (05/13 0800) Pulse Rate:  [57-103] 89 (05/13 0900) Cardiac Rhythm:  [-] Normal sinus rhythm (05/13 0800) Resp:  [11-25] 14 (05/13 0900) BP: (104-183)/(50-87) 126/58 mmHg (05/13 0900) SpO2:  [96 %-100 %] 96 % (05/13 0900) FiO2 (%):  [30 %-40 %] 30 % (05/13 0900) Weight:  [216 lb 11.4 oz (98.3 kg)] 216 lb 11.4 oz (98.3 kg) (05/13 0600)   Recent Labs Lab 02/13/15 1605 02/13/15 1950 02/13/15 2359 02/14/15 0422 02/14/15 0800  GLUCAP 133* 107* 118* 119* 108*    Recent Labs Lab 02/10/15 1055  02/11/15 0438  02/12/15 0420  02/13/15 0320 02/13/15 0827 02/13/15 1415 02/13/15 2000 02/14/15 0245 02/14/15 0818  NA 132*  < > 141  < > 152*  < > 159*  159* 160* 159* 157* 157* 156*  K 2.8*  --  3.6  --  3.9  --  3.4*  --   --   --  3.4*  --   CL 98*  --  109  --  118*  --  122*  --   --   --  117*  --   CO2 24  --  24  --  26  --  27  --   --   --  28  --   GLUCOSE 141*  --  141*  --  135*  --  118*  --   --   --  139*  --   BUN 14  --  20  --  26*  --  31*  --   --   --  31*  --   CREATININE 0.96  --  1.09  --  1.05  --  1.13  --   --   --  1.13  --   CALCIUM 7.8*  --  7.6*  --  7.6*  --  8.0*  --   --   --  8.4*  --   MG  --   --  1.8  --   --   --   --   --   --   --   --   --   PHOS  --   --  2.5  --   --   --   --   --   --   --   --   --   < > = values in this interval not displayed.  Recent Labs Lab 02/08/15 1823 02/12/15 0420 02/13/15 0320  AST 28 25 24   ALT 25 27 31   ALKPHOS 54 44 44  BILITOT 0.6 0.5 0.5  PROT 6.8 5.8* 6.0*  ALBUMIN 3.7 2.4* 2.3*    Recent Labs Lab  02/08/15 1823  02/09/15 0745 02/10/15 0227 02/11/15 0438 02/12/15 0420 02/13/15 0320  WBC 10.2  --  18.4* 23.7* 18.1* 15.6* 13.0*  NEUTROABS 7.3  --   --   --   --  12.8* 10.2*  HGB 13.0  < > 14.3 15.6 13.9 12.3* 11.8*  HCT 39.8  < > 42.6 44.6 40.9 38.2* 37.1*  MCV 89.0  --  86.6 83.8 85.9 88.6 89.8  PLT 166  --  180 180 174 153 158  < > = values in this interval not displayed.  Recent Labs Lab 02/11/15 2345 02/12/15 0420 02/12/15 1000  TROPONINI 0.10* 0.06* <0.03   No results for input(s): LABPROT, INR in the last 72 hours. No results for input(s): COLORURINE, LABSPEC, PHURINE, GLUCOSEU, HGBUR, BILIRUBINUR, KETONESUR, PROTEINUR, UROBILINOGEN, NITRITE, LEUKOCYTESUR in the last 72 hours.  Invalid input(s): APPERANCEUR     Component Value Date/Time   CHOL 178 02/09/2015 0745   TRIG 121 02/12/2015 1141   HDL 62 02/09/2015 0745   CHOLHDL 2.9 02/09/2015 0745   VLDL 9 02/09/2015 0745   LDLCALC 107* 02/09/2015 0745   Lab Results  Component Value Date   HGBA1C 6.0* 02/09/2015      Component Value Date/Time   LABOPIA NONE DETECTED 02/08/2015 1823   COCAINSCRNUR NONE DETECTED 02/08/2015 1823   LABBENZ NONE DETECTED 02/08/2015 1823   AMPHETMU NONE DETECTED 02/08/2015 1823   THCU NONE DETECTED 02/08/2015 1823   LABBARB NONE DETECTED 02/08/2015 1823     Recent Labs Lab 02/08/15 1826  ETH <5    I have personally reviewed the radiological images below and agree with the radiology interpretations.  Ct Head Wo Contrast 02/10/2015 - 1. No significant interval change in size of left occipital lobe intraparenchymal hematoma with slightly increased associated vasogenic edema as compared to previous. Similar trace left-to-right shift. 2. Stable to slightly decreased conspicuity of bilateral small volume subarachnoid hemorrhage. 3. Stable intraventricular hemorrhage. No hydrocephalus. 4. Persistent left subdural hematoma measuring up to 3.5 mm in maximal diameter. Blood along the  left tentorium is decreased in conspicuity. 5. No new intracranial abnormality.  02/09/2015   IMPRESSION: 1. Slight enlargement of large left occipital lobe intra-axial hemorrhage. Estimated hemorrhage volume now 58 mm. Stable left posterior hemisphere edema and mass effect. 2. Mild progression of subarachnoid extension, now bilateral. Extension and of a left subdural space with small broad-based left subdural hematoma not significantly changed. Stable intraventricular hemorrhage volume. No ventriculomegaly. 3. No new intracranial abnormality identified.      02/08/2015   IMPRESSION: 1. 6.7 cm left occipital parenchymal hemorrhage with mild surrounding edema. No midline shift. 2. Small amount of adjacent subarachnoid hemorrhage and small amount of intraventricular extension of blood. 3. Small subdural hematomas over the left cerebral convexity and left tentorium.   Dg Chest Port 1 View 02/13/15 1. Stable support apparatus. 2. Improved basilar atelectasis. 02/10/15 Tube and catheter positions as described without pneumothorax. No edema or consolidation. 02/09/2015    IMPRESSION: No acute cardiopulmonary disease.     Carotid Doppler  No evidence of significant stenosis in the right or left ICA. The left side was technically challenged due to line placement.   2D Echocardiogram  Left ventricle: The cavity size was normal. Wall thickness was increased in a pattern of moderate LVH. Systolic function was normal. The estimated ejection fraction was in the range of 55% to 60%. Doppler parameters are consistent with elevated ventricular end-diastolic filling pressure. - Aortic valve: Poorly seen Moderately calcified mild stenosis by gradients but may be underestimated due to poor CW angle. Valve area (VTI): 2.11 cm^2. Valve area (Vmax): 2.01 cm^2. Valve area (Vmean): 1.88 cm^2. - Mitral valve: There was mild regurgitation. - Left atrium: The atrium was moderately dilated. - Impressions: Cannot  r/o SOE due to extremely poor image quality. Impressions: - Cannot r/o SOE due to extremely poor image quality.  MRI and MRA  7.3 x 3.3 cm left occipital hematoma unchanged from the prior CT. There is intraventricular hemorrhage and subarachnoid hemorrhage also unchanged. There is mass-effect on the left occipital horn and 3 mm midline shift to the right. No evidence of underlying tumor or vascular malformation. This may be related to cerebral amyloid angiography. Hypertension also possible. Negative MRA circle Willis.  EEG This EEG is abnormal with moderately severe generalized continuous nonspecific slowing of cerebral activity. No evidence of epileptic activity was recorded. The absence of epileptiform activity during an EEG recording does not, in and of itself, rule out seizure disorder, however.  EKG  NSR  PHYSICAL EXAM  Temp:  [99.4 F (37.4 C)-101.4 F (38.6 C)] 99.7 F (37.6 C) (05/13 0800) Pulse Rate:  [57-103] 89 (05/13 0900) Resp:  [11-25] 14 (05/13 0900) BP: (104-183)/(50-87) 126/58 mmHg (05/13 0900) SpO2:  [96 %-100 %] 96 % (05/13 0900) FiO2 (%):  [30 %-40 %] 30 % (05/13 0900) Weight:  [216 lb 11.4 oz (98.3 kg)] 216 lb 11.4 oz (98.3 kg) (05/13 0600)  General - Well nourished, well developed, intubated and sedated but open eyes on voice.  Ophthalmologic - fundi not visualized due to incorporation.  Cardiovascular - Regular rate and rhythm.  Neuro -  Intubated and sedated, open eyes on pain stimulation but not able to keep eyes open without constant stimulation, not following commands. Neglect to the right, not blinking to visual threat to the right. Eyes disconjugated but difficulty to test with sedation. PERRL, corneal and gag and cough reflex present. Moving all extremities, however difficult to precisely evaluate due to not following commands. LUE localizing to pain but RUE 1/5 on pain, BLE withdraw at least 2+/5 on pain stimulation. Reflexes were 1+ in all  extremities and he had no pathological reflexes.  ASSESSMENT/PLAN Austin Sawyer is a 80 y.o. male with history of HTN, DM, CAD, a flutter on Coumadin before until 2010, alcohol abuse admitted for headache, nausea vomiting, confusion, blurred vision. CT had showed left occipital ICH with small SAH, SDH and IVH. Symptoms and changed and neuro stable.    Left occipital ICH with small SAH, SDH and IVH - etiology unclear, HTN versus CAA  MRI  Stable ICH, SDH and IVH - no new bleeding and minimal midline shift   MRA  unremarkable   Repeat CT showed stable hematoma with slight extension of SAH and SDH, stable IVH  Carotid Doppler  unremarkable  2D Echo  EF 55-60%  EEG no seizure  LDL 107, not at goal  HgbA1c 6.0  heparin subq for VTE prophylaxis  Diet NPO time specified   no antithrombotic prior to admission, now on no antithrombotic due to ICH  Ongoing aggressive stroke risk factor management  Therapy recommendations:  Pending  Disposition:  Pending  Respiratory failure  Intubated on sedation  CXR did not show CHF and improved LLL infiltrates.  CCM following  Extubate as able  Hyponatremia  Concerning for SIADH vs. salt wasting initially  Sodium from 143-> 131 -> 138 -> 145->154->159->156  Off 3% saline  Na goal normalization now  Diabetes  HbA1c 6.0, goal < 7.0  Controlled  CBG monitoring  SSI  Hypertension  Home meds:  Coreg, losartan, BP goal < 160 Currently on hydralazine and metoprolol  Better controlled  Close monitoring  claviprex in needed   Hyperlipidemia  Home meds:   Vytorin  LDL 107, goal < 70  Statin on hold now due to ICH  Continue statin at discharge  A. flutter  Patient history of a flutter  Was on Coumadin in the past  no anticoagulation or antiplatelet PTA  No a flutter on telemetry  Patient may not be a candidate for anticoagulation  Other Stroke Risk Factors  Advanced age  ETOH use  Obesity, Body  mass index is 34.99 kg/(m^2).   Other Active Problems  Mild hypokalemia  Leukocytosis  Other Pertinent History    Hospital day # 6  This patient is critically ill due to respiratory failure, large left occipital ICH with SAH and IVH, hyponatremia and at significant risk of neurological worsening, death form respiratory failure, rebleeding, cerebral edema, brain herniation, and status epilepticus. This patient's care requires constant monitoring of vital signs, hemodynamics, respiratory and cardiac monitoring, review of multiple databases, neurological assessment, discussion with family, other specialists and medical decision making of high complexity. I spent 35 minutes of neurocritical care time in the care of this patient.  Marvel Plan, MD PhD Stroke Neurology 02/14/2015 9:52 AM    To contact Stroke Continuity provider, please refer to [email or web address]. After hours, contact General Neurology

## 2015-02-14 NOTE — Progress Notes (Signed)
PULMONARY / CRITICAL CARE MEDICINE   Name: Austin Sawyer MRN: [id number] DOB: 06/02/1935    ADMISSION DATE:  02/08/2015 CONSULTATION DATE:  02/10/2015  REFERRING MD :  Neurology  CHIEF COMPLAINT:  ICH, headache and vomiting  INITIAL PRESENTATION: 80 year old male with PMH of HTN presenting with ICH on 5/7.  On 5/9 patient decompensated and was unable to protect his airway.  PCCM was called on consultation for intubation and TLC placement for 3% saline.  STUDIES:  5/9 repeat head CT with edema and ICH that was stable. 5/10 mri brain - 7.3 x 3.3 cm left occipital hematoma unchanged from the prior CT. There is intraventricular hemorrhage and subarachnoid hemorrhage also unchanged. There is mass-effect on the left occipital horn and 3 mm midline shift to the right.  5/11 - echo - 55% , mild AS by gradient only  SIGNIFICANT EVENTS: 5/7 ICH on CT and AMS. 5/9 Unable to protect his airway and PCCM consulted. 5/10- remains HTN  SUBJECTIVE: weaning  VITAL SIGNS: Temp:  [99.4 F (37.4 C)-101.4 F (38.6 C)] 99.7 F (37.6 C) (05/13 0800) Pulse Rate:  [57-95] 76 (05/13 1000) Resp:  [11-25] 16 (05/13 1000) BP: (104-183)/(50-87) 138/63 mmHg (05/13 1000) SpO2:  [96 %-100 %] 96 % (05/13 1000) FiO2 (%):  [30 %-40 %] 30 % (05/13 1124) Weight:  [98.3 kg (216 lb 11.4 oz)] 98.3 kg (216 lb 11.4 oz) (05/13 0600) HEMODYNAMICS:   VENTILATOR SETTINGS: Vent Mode:  [-] CPAP;PSV FiO2 (%):  [30 %-40 %] 30 % Set Rate:  [16 bmp] 16 bmp Vt Set:  [510 mL] 510 mL PEEP:  [5 cmH20-8 cmH20] 5 cmH20 Pressure Support:  [5 cmH20-8 cmH20] 8 cmH20 Plateau Pressure:  [17 cmH20-19 cmH20] 19 cmH20 INTAKE / OUTPUT:  Intake/Output Summary (Last 24 hours) at 02/14/15 1144 Last data filed at 02/14/15 1000  Gross per 24 hour  Intake 1539.21 ml  Output   2725 ml  Net -1185.79 ml    PHYSICAL EXAMINATION: General:  Acutely ill appearing Neuro:  rass 0, awakens HEENT:  jvd wnl Cardiovascular:  RRR, Nl S1/S2,  -M/R/G. Lungs:  cta Abdomen:  Soft, NT, ND and +BS. Musculoskeletal:  edema Skin:  Intact.  LABS:  CBC  Recent Labs Lab 02/11/15 0438 02/12/15 0420 02/13/15 0320  WBC 18.1* 15.6* 13.0*  HGB 13.9 12.3* 11.8*  HCT 40.9 38.2* 37.1*  PLT 174 153 158   Coag's  Recent Labs Lab 02/08/15 1823  APTT 27  INR 1.07   BMET  Recent Labs Lab 02/12/15 0420  02/13/15 0320  02/13/15 2000 02/14/15 0245 02/14/15 0818  NA 152*  < > 159*  159*  < > 157* 157* 156*  K 3.9  --  3.4*  --   --  3.4*  --   CL 118*  --  122*  --   --  117*  --   CO2 26  --  27  --   --  28  --   BUN 26*  --  31*  --   --  31*  --   CREATININE 1.05  --  1.13  --   --  1.13  --   GLUCOSE 135*  --  118*  --   --  139*  --   < > = values in this interval not displayed. Electrolytes  Recent Labs Lab 02/11/15 0438 02/12/15 0420 02/13/15 0320 02/14/15 0245  CALCIUM 7.6* 7.6* 8.0* 8.4*  MG 1.8  --   --   --  PHOS 2.5  --   --   --    Sepsis Markers No results for input(s): LATICACIDVEN, PROCALCITON, O2SATVEN in the last 168 hours. ABG  Recent Labs Lab 02/10/15 1229 02/11/15 0330  PHART 7.413 7.398  PCO2ART 39.0 39.5  PO2ART 334.0* 126*   Liver Enzymes  Recent Labs Lab 02/08/15 1823 02/12/15 0420 02/13/15 0320  AST 28 25 24   ALT 25 27 31   ALKPHOS 54 44 44  BILITOT 0.6 0.5 0.5  ALBUMIN 3.7 2.4* 2.3*   Cardiac Enzymes  Recent Labs Lab 02/11/15 2345 02/12/15 0420 02/12/15 1000  TROPONINI 0.10* 0.06* <0.03   Glucose  Recent Labs Lab 02/13/15 1233 02/13/15 1605 02/13/15 1950 02/13/15 2359 02/14/15 0422 02/14/15 0800  GLUCAP 99 133* 107* 118* 119* 108*    Imaging Dg Chest Port 1 View  02/13/2015   CLINICAL DATA:  Atelectasis  EXAM: PORTABLE CHEST - 1 VIEW  COMPARISON:  Radiograph 02/12/2015  FINDINGS: Endotracheal tube and NG tube are unchanged. Stable cardiac silhouette with ectatic aorta. Left central venous line is unchanged. There is left basilar atelectasis  slightly improved. No pneumothorax.  IMPRESSION: 1. Stable support apparatus. 2. Improved basilar atelectasis.   Electronically Signed   By: Genevive BiStewart  Edmunds M.D.   On: 02/13/2015 08:21     ASSESSMENT / PLAN:  PULMONARY OETT 5/9>>> A: VDRF due to inability to protect his airways. ATX, LLL ? Mucus plug>? Improved pcxr 5/13 P:   atx is improved, allow mucomyst's and chest pt to dc Wean cpap 5 ps 5, assess rsbi, cough Need WUA Upright position Assess airway protection skills and secretion status  CARDIOVASCULAR CVL L IJ TLC 5/9>>> A: HTN in setting ICH P:  -consider sys 150 -hydral oral  To increase further -may have room to increase BB  RENAL A:  Hyponatremia, hypokalemia and hypochloremia, elevated urine output ( iatrogenic? At risk DI, CSW) P:   - na noted -ensure k supp  GASTROINTESTINAL A:  Nutrition needs P:   - PPI. -TF to goal, hold for weaning -mirlalax -add dulc  HEMATOLOGIC A:  DVT prevention P:  - CBC in 48 hr -limit phlebotomy when able  INFECTIOUS A: r/o LLL PNA, from aspiration pre ETT P:   5/11 sputum>>>few candida (not a pathogen)  -unasyn 5/11>>> -pcxr improved  ENDOCRINE A:  No diabetes by history.   P:   - Monitor glu with BMET  NEUROLOGIC A:  ICH, unable to protect his airway. P:   RASS goal: 0 - Propofol for sedation, WUA mandatory - PRN fentanyl. -hob elevated  FAMILY  - Updates: No family bedside.  Ccm time 30 min   Mcarthur Rossettianiel J. Tyson AliasFeinstein, MD, FACP Pgr: [phone] [facility]

## 2015-02-14 NOTE — Progress Notes (Signed)
UR completed.  Eboni Coval, RN BSN MHA CCM Trauma/Neuro ICU Case Manager [phone]  

## 2015-02-14 NOTE — Progress Notes (Signed)
ANTIBIOTIC CONSULT NOTE - FOLLOW UP  Pharmacy Consult for Unasyn Indication: aspiration pneumonia  No Known Allergies  Patient Measurements: Height: 5\' 6"  (167.6 cm) Weight: 216 lb 11.4 oz (98.3 kg) IBW/kg (Calculated) : 63.8  Vital Signs: Temp: 100.3 F (37.9 C) (05/13 1200) Temp Source: Axillary (05/13 1200) BP: 134/68 mmHg (05/13 1500) Pulse Rate: 91 (05/13 1500) Intake/Output from previous day: 05/12 0701 - 05/13 0700 In: 1644.9 [I.V.:384.9; NG/GT:860; IV Piggyback:400] Out: 2625 [Urine:2625] Intake/Output from this shift: Total I/O In: 391.5 [I.V.:211.5; NG/GT:80; IV Piggyback:100] Out: 750 [Urine:750]  Labs:  Recent Labs  02/12/15 0420 02/13/15 0320 02/14/15 0245  WBC 15.6* 13.0*  --   HGB 12.3* 11.8*  --   PLT 153 158  --   CREATININE 1.05 1.13 1.13   Estimated Creatinine Clearance: 59.1 mL/min (by C-G formula based on Cr of 1.13).  Assessment:  Day # 4 Unasyn for aspiration pneumonia coverage.  Tmax 101.4, WBC down to 13.0 on 02/13/15.   Atelectasis noted improving, thick secretions.  Few Candida in sputum culture non-pathogenic.  Creatinine stable. BUN trending up some.  Goal of Therapy:  appropriate Unasyn dose for renal function and infection  Plan:   Continue Unasyn 3 gm IV q6hrs.  Follow renal function, progress, and duration of therapy.  Dennie FettersEgan, Klaryssa Fauth Donovan, RPh Pager: [phone] 02/14/2015,3:58 PM

## 2015-02-15 ENCOUNTER — Inpatient Hospital Stay (HOSPITAL_COMMUNITY): Payer: Medicare Other

## 2015-02-15 LAB — GLUCOSE, CAPILLARY
GLUCOSE-CAPILLARY: 104 mg/dL — AB (ref 65–99)
Glucose-Capillary: 113 mg/dL — ABNORMAL HIGH (ref 65–99)
Glucose-Capillary: 126 mg/dL — ABNORMAL HIGH (ref 65–99)
Glucose-Capillary: 117 mg/dL — ABNORMAL HIGH (ref 65–99)
Glucose-Capillary: 120 mg/dL — ABNORMAL HIGH (ref 65–99)
Glucose-Capillary: 129 mg/dL — ABNORMAL HIGH (ref 65–99)
Glucose-Capillary: 103 mg/dL — ABNORMAL HIGH (ref 65–99)

## 2015-02-15 LAB — BASIC METABOLIC PANEL
Anion gap: 13 (ref 5–15)
BUN: 24 mg/dL — AB (ref 6–20)
CALCIUM: 8.5 mg/dL — AB (ref 8.9–10.3)
CHLORIDE: 112 mmol/L — AB (ref 101–111)
CO2: 27 mmol/L (ref 22–32)
Creatinine, Ser: 1.24 mg/dL (ref 0.61–1.24)
GFR calc Af Amer: >60 mL/min (ref >60)
GFR calc non Af Amer: 54 mL/min — ABNORMAL LOW (ref >60)
Glucose, Bld: 120 mg/dL — ABNORMAL HIGH (ref 65–99)
POTASSIUM: 3.3 mmol/L — AB (ref 3.5–5.1)
Sodium: 152 mmol/L — ABNORMAL HIGH (ref 135–145)

## 2015-02-15 LAB — SODIUM
SODIUM: 155 mmol/L — AB (ref 135–145)
Sodium: 154 mmol/L — ABNORMAL HIGH (ref 135–145)

## 2015-02-15 MED ORDER — ACETAMINOPHEN 10 MG/ML IV SOLN
1000.0000 mg | Freq: Once | INTRAVENOUS | Status: AC
  Administered 2015-02-15: 1000 mg via INTRAVENOUS
  Filled 2015-02-15: qty 100

## 2015-02-15 MED ORDER — POTASSIUM CHLORIDE 20 MEQ/15ML (10%) PO SOLN
40.0000 meq | Freq: Once | ORAL | Status: DC
  Filled 2015-02-15: qty 30

## 2015-02-15 MED ORDER — METOPROLOL TARTRATE 1 MG/ML IV SOLN
5.0000 mg | Freq: Four times a day (QID) | INTRAVENOUS | Status: DC
  Administered 2015-02-15 – 2015-02-17 (×8): 5 mg via INTRAVENOUS
  Filled 2015-02-15 (×13): qty 5

## 2015-02-15 MED ORDER — POTASSIUM CHLORIDE 10 MEQ/50ML IV SOLN
10.0000 meq | INTRAVENOUS | Status: AC
  Administered 2015-02-15 (×4): 10 meq via INTRAVENOUS
  Filled 2015-02-15 (×4): qty 50

## 2015-02-15 NOTE — Progress Notes (Signed)
3 nurses unsuccessful with NG/Panda placement. MD notified at this time stated to hold off on NG placement. Will convert meds to IV and evaluate Swallow before re attempting NG tube. Fever also spiked to 103. MD notified and will administer IV tylenol.

## 2015-02-15 NOTE — Progress Notes (Signed)
STROKE TEAM PROGRESS NOTE   SUBJECTIVE (INTERVAL HISTORY) Daughter is at the bedside. He was extubated y`day and is breathing well but remains sleepy  s. Na 152, 3% saline has been discontinued. His BP better controlled   OBJECTIVE Temp:  [100.9 F (38.3 C)-101.9 F (38.8 C)] 101.5 F (38.6 C) (05/14 0500) Pulse Rate:  [31-102] 101 (05/14 1200) Cardiac Rhythm:  [-] Sinus tachycardia (05/13 2000) Resp:  [12-39] 20 (05/14 1200) BP: (105-176)/(40-94) 126/62 mmHg (05/14 1200) SpO2:  [91 %-99 %] 96 % (05/14 1200) Weight:  [212 lb 4.9 oz (96.3 kg)] 212 lb 4.9 oz (96.3 kg) (05/14 0600)   Recent Labs Lab 02/14/15 1638 02/14/15 2000 02/15/15 0045 02/15/15 0336 02/15/15 0710  GLUCAP 116* 121* 126* 113* 117*    Recent Labs Lab 02/11/15 0438  02/12/15 0420  02/13/15 0320  02/14/15 0245  02/14/15 1515 02/14/15 2019 02/15/15 0150 02/15/15 0820 02/15/15 1125  NA 141  < > 152*  < > 159*  159*  < > 157*  < > 157* 157* 155* 154* 152*  K 3.6  --  3.9  --  3.4*  --  3.4*  --   --   --   --   --  3.3*  CL 109  --  118*  --  122*  --  117*  --   --   --   --   --  112*  CO2 24  --  26  --  27  --  28  --   --   --   --   --  27  GLUCOSE 141*  --  135*  --  118*  --  139*  --   --   --   --   --  120*  BUN 20  --  26*  --  31*  --  31*  --   --   --   --   --  24*  CREATININE 1.09  --  1.05  --  1.13  --  1.13  --   --   --   --   --  1.24  CALCIUM 7.6*  --  7.6*  --  8.0*  --  8.4*  --   --   --   --   --  8.5*  MG 1.8  --   --   --   --   --   --   --   --   --   --   --   --   PHOS 2.5  --   --   --   --   --   --   --   --   --   --   --   --   < > = values in this interval not displayed.  Recent Labs Lab 02/08/15 1823 02/12/15 0420 02/13/15 0320  AST 28 25 24   ALT 25 27 31   ALKPHOS 54 44 44  BILITOT 0.6 0.5 0.5  PROT 6.8 5.8* 6.0*  ALBUMIN 3.7 2.4* 2.3*    Recent Labs Lab 02/08/15 1823  02/09/15 0745 02/10/15 0227 02/11/15 0438 02/12/15 0420 02/13/15 0320  WBC  10.2  --  18.4* 23.7* 18.1* 15.6* 13.0*  NEUTROABS 7.3  --   --   --   --  12.8* 10.2*  HGB 13.0  < > 14.3 15.6 13.9 12.3* 11.8*  HCT 39.8  < > 42.6 44.6 40.9 38.2* 37.1*  MCV  89.0  --  86.6 83.8 85.9 88.6 89.8  PLT 166  --  180 180 174 153 158  < > = values in this interval not displayed.  Recent Labs Lab 02/11/15 2345 02/12/15 0420 02/12/15 1000  TROPONINI 0.10* 0.06* <0.03   No results for input(s): LABPROT, INR in the last 72 hours. No results for input(s): COLORURINE, LABSPEC, PHURINE, GLUCOSEU, HGBUR, BILIRUBINUR, KETONESUR, PROTEINUR, UROBILINOGEN, NITRITE, LEUKOCYTESUR in the last 72 hours.  Invalid input(s): APPERANCEUR     Component Value Date/Time   CHOL 178 02/09/2015 0745   TRIG 121 02/12/2015 1141   HDL 62 02/09/2015 0745   CHOLHDL 2.9 02/09/2015 0745   VLDL 9 02/09/2015 0745   LDLCALC 107* 02/09/2015 0745   Lab Results  Component Value Date   HGBA1C 6.0* 02/09/2015      Component Value Date/Time   LABOPIA NONE DETECTED 02/08/2015 1823   COCAINSCRNUR NONE DETECTED 02/08/2015 1823   LABBENZ NONE DETECTED 02/08/2015 1823   AMPHETMU NONE DETECTED 02/08/2015 1823   THCU NONE DETECTED 02/08/2015 1823   LABBARB NONE DETECTED 02/08/2015 1823     Recent Labs Lab 02/08/15 1826  ETH <5    I have personally reviewed the radiological images below and agree with the radiology interpretations.  Ct Head Wo Contrast 02/10/2015 - 1. No significant interval change in size of left occipital lobe intraparenchymal hematoma with slightly increased associated vasogenic edema as compared to previous. Similar trace left-to-right shift. 2. Stable to slightly decreased conspicuity of bilateral small volume subarachnoid hemorrhage. 3. Stable intraventricular hemorrhage. No hydrocephalus. 4. Persistent left subdural hematoma measuring up to 3.5 mm in maximal diameter. Blood along the left tentorium is decreased in conspicuity. 5. No new intracranial abnormality.  02/09/2015    IMPRESSION: 1. Slight enlargement of large left occipital lobe intra-axial hemorrhage. Estimated hemorrhage volume now 58 mm. Stable left posterior hemisphere edema and mass effect. 2. Mild progression of subarachnoid extension, now bilateral. Extension and of a left subdural space with small broad-based left subdural hematoma not significantly changed. Stable intraventricular hemorrhage volume. No ventriculomegaly. 3. No new intracranial abnormality identified.      02/08/2015   IMPRESSION: 1. 6.7 cm left occipital parenchymal hemorrhage with mild surrounding edema. No midline shift. 2. Small amount of adjacent subarachnoid hemorrhage and small amount of intraventricular extension of blood. 3. Small subdural hematomas over the left cerebral convexity and left tentorium.   Dg Chest Port 1 View 02/13/15 1. Stable support apparatus. 2. Improved basilar atelectasis. 02/10/15 Tube and catheter positions as described without pneumothorax. No edema or consolidation. 02/09/2015    IMPRESSION: No acute cardiopulmonary disease.     Carotid Doppler  No evidence of significant stenosis in the right or left ICA. The left side was technically challenged due to line placement.   2D Echocardiogram  Left ventricle: The cavity size was normal. Wall thickness was increased in a pattern of moderate LVH. Systolic function was normal. The estimated ejection fraction was in the range of 55% to 60%. Doppler parameters are consistent with elevated ventricular end-diastolic filling pressure. - Aortic valve: Poorly seen Moderately calcified mild stenosis by gradients but may be underestimated due to poor CW angle. Valve area (VTI): 2.11 cm^2. Valve area (Vmax): 2.01 cm^2. Valve area (Vmean): 1.88 cm^2. - Mitral valve: There was mild regurgitation. - Left atrium: The atrium was moderately dilated. - Impressions: Cannot r/o SOE due to extremely poor image quality. Impressions: - Cannot r/o SOE due to extremely  poor image quality.  MRI and MRA  7.3 x 3.3 cm left occipital hematoma unchanged from the prior CT. There is intraventricular hemorrhage and subarachnoid hemorrhage also unchanged. There is mass-effect on the left occipital horn and 3 mm midline shift to the right. No evidence of underlying tumor or vascular malformation. This may be related to cerebral amyloid angiography. Hypertension also possible. Negative MRA circle Willis.  EEG This EEG is abnormal with moderately severe generalized continuous nonspecific slowing of cerebral activity. No evidence of epileptic activity was recorded. The absence of epileptiform activity during an EEG recording does not, in and of itself, rule out seizure disorder, however.  EKG  NSR  PHYSICAL EXAM  Temp:  [100.9 F (38.3 C)-101.9 F (38.8 C)] 101.5 F (38.6 C) (05/14 0500) Pulse Rate:  [31-102] 101 (05/14 1200) Resp:  [12-39] 20 (05/14 1200) BP: (105-176)/(40-94) 126/62 mmHg (05/14 1200) SpO2:  [91 %-99 %] 96 % (05/14 1200) Weight:  [212 lb 4.9 oz (96.3 kg)] 212 lb 4.9 oz (96.3 kg) (05/14 0600)  General - Well nourished, well developed, intubated and sedated but open eyes on voice.  Ophthalmologic - fundi not visualized due to incorporation.  Cardiovascular - Regular rate and rhythm.  Neuro -  Extubated, drowsy but easily arousable, open eyes  stimulation but not able to keep eyes open without constant stimulation,   following commands. Neglect to the right, not blinking to visual threat to the right. Eyes left gaze preference.h   PERRL, corneal and gag and cough reflex present. Moving all extremities, however difficult to precisely evaluate due to not following commands. LUE localizing to pain but RUE 1/5 on pain, BLE withdraw at least 2+/5 on pain stimulation. Reflexes were 1+ in all extremities and he had no pathological reflexes.  ASSESSMENT/PLAN Mr. Emmerson Taddei is a 80 y.o. male with history of HTN, DM, CAD, a flutter on Coumadin before  until 2010, alcohol abuse admitted for headache, nausea vomiting, confusion, blurred vision. CT had showed left occipital ICH with small SAH, SDH and IVH. Symptoms and changed and neuro stable.    Left occipital ICH with small SAH, SDH and IVH - etiology unclear, HTN versus CAA vs hemorrhagic infarct  MRI  Stable ICH, SDH and IVH - no new bleeding and minimal midline shift   MRA  unremarkable   Repeat CT showed stable hematoma with slight extension of SAH and SDH, stable IVH  Carotid Doppler  unremarkable  2D Echo  EF 55-60%  EEG no seizure  LDL 107, not at goal  HgbA1c 6.0  heparin subq for VTE prophylaxis  Diet NPO time specified   no antithrombotic prior to admission, now on no antithrombotic due to ICH  Ongoing aggressive stroke risk factor management  Therapy recommendations:  Pending  Disposition:  Pending  Respiratory failure  Intubated on sedation  CXR did not show CHF and improved LLL infiltrates.  CCM following  Extubate as able  Hyponatremia  Concerning for SIADH vs. salt wasting initially  Sodium from 143-> 131 -> 138 -> 145->154->159->156-152  Off 3% saline  Na goal normalization now  Diabetes  HbA1c 6.0, goal < 7.0  Controlled  CBG monitoring  SSI  Hypertension  Home meds:  Coreg, losartan, BP goal < 160 Currently on hydralazine and metoprolol  Better controlled  Close monitoring  claviprex in needed   Hyperlipidemia  Home meds:   Vytorin  LDL 107, goal < 70  Statin on hold now due to ICH  Continue statin at discharge  A. flutter  Patient history of a flutter  Was on Coumadin in the past  no anticoagulation or antiplatelet PTA  No a flutter on telemetry  Patient may not be a candidate for anticoagulation  Other Stroke Risk Factors  Advanced age  ETOH use  Obesity, Body mass index is 34.28 kg/(m^2).   Other Active Problems  Mild hypokalemia  Leukocytosis  Other Pertinent History    Hospital  day # 7 [place] d/w daughter at bedside about prognosis, plan of care and answered questions.Mobilize out of bed today. ST swallow eval later today when more awake. This patient is critically ill due to respiratory failure, large left occipital ICH with SAH and IVH, hyponatremia and at significant risk of neurological worsening, death form respiratory failure, rebleeding, cerebral edema, brain herniation, and status epilepticus. This patient's care requires constant monitoring of vital signs, hemodynamics, respiratory and cardiac monitoring, review of multiple databases, neurological assessment, discussion with family, other specialists and medical decision making of high complexity. I spent 30 minutes of neurocritical care time in the care of this patient.  Delia Heady, MD Stroke Neurology 02/15/2015 12:20 PM    To contact Stroke Continuity provider, please refer to [email or web address]. After hours, contact General Neurology

## 2015-02-15 NOTE — Progress Notes (Signed)
From [id number] pt had a minute and half beats of SVT where heart rate jumped up to 200 on monitor non sustained. notified MD and wanted to get stat EKG and stat BMET. EKG showed SR with PVC's. Will attempt placement of NG tube. Will continue to monitor

## 2015-02-15 NOTE — Progress Notes (Addendum)
PULMONARY / CRITICAL CARE MEDICINE   Name: Candise CheLeon Tung MRN: [id number] DOB: 12/01/1934    ADMISSION DATE:  02/08/2015 CONSULTATION DATE:  02/10/2015  REFERRING MD :  Neurology  CHIEF COMPLAINT:  ICH, headache and vomiting  INITIAL PRESENTATION: 80 year old male with PMH of HTN presenting with ICH on 5/7.  On 5/9 patient decompensated and was unable to protect his airway.  PCCM was called on consultation for intubation and TLC placement for 3% saline.  STUDIES:  5/9 repeat head CT with edema and ICH that was stable. 5/10 mri brain - 7.3 x 3.3 cm left occipital hematoma unchanged from the prior CT. There is intraventricular hemorrhage and subarachnoid hemorrhage also unchanged. There is mass-effect on the left occipital horn and 3 mm midline shift to the right.  5/11 - echo - 55% , mild AS by gradient only  SIGNIFICANT EVENTS: 5/7 ICH on CT and AMS. 5/9 Unable to protect his airway and PCCM consulted. 5/10- remains HTN  SUBJECTIVE:  Extubated 5/13  VITAL SIGNS: Temp:  [100.3 F (37.9 C)-101.9 F (38.8 C)] 101.5 F (38.6 C) (05/14 0500) Pulse Rate:  [31-102] 102 (05/14 0900) Resp:  [12-39] 23 (05/14 0900) BP: (105-176)/(40-94) 105/81 mmHg (05/14 0900) SpO2:  [91 %-99 %] 95 % (05/14 0900) FiO2 (%):  [30 %] 30 % (05/13 1124) Weight:  [96.3 kg (212 lb 4.9 oz)] 96.3 kg (212 lb 4.9 oz) (05/14 0600) HEMODYNAMICS:   VENTILATOR SETTINGS: Vent Mode:  [-] CPAP;PSV FiO2 (%):  [30 %] 30 % PEEP:  [5 cmH20] 5 cmH20 Pressure Support:  [8 cmH20] 8 cmH20 INTAKE / OUTPUT:  Intake/Output Summary (Last 24 hours) at 02/15/15 1007 Last data filed at 02/15/15 1000  Gross per 24 hour  Intake 1428.3 ml  Output    925 ml  Net  503.3 ml    PHYSICAL EXAMINATION: General:  Ill appearing, comfortable Neuro: wakes to loud voice, opens eyes, moves spontaneously, slurred speech, not oriented HEENT: poor hearing, espec on L, Op clear, poor dentition Cardiovascular:  RRR, no M Lungs:  Clear   Abdomen:  Soft, NT, ND and +BS. Musculoskeletal: no edema Skin:  Intact.  LABS:  CBC  Recent Labs Lab 02/11/15 0438 02/12/15 0420 02/13/15 0320  WBC 18.1* 15.6* 13.0*  HGB 13.9 12.3* 11.8*  HCT 40.9 38.2* 37.1*  PLT 174 153 158   Coag's  Recent Labs Lab 02/08/15 1823  APTT 27  INR 1.07   BMET  Recent Labs Lab 02/12/15 0420  02/13/15 0320  02/14/15 0245  02/14/15 2019 02/15/15 0150 02/15/15 0820  NA 152*  < > 159*  159*  < > 157*  < > 157* 155* 154*  K 3.9  --  3.4*  --  3.4*  --   --   --   --   CL 118*  --  122*  --  117*  --   --   --   --   CO2 26  --  27  --  28  --   --   --   --   BUN 26*  --  31*  --  31*  --   --   --   --   CREATININE 1.05  --  1.13  --  1.13  --   --   --   --   GLUCOSE 135*  --  118*  --  139*  --   --   --   --   < > =  values in this interval not displayed. Electrolytes  Recent Labs Lab 02/11/15 0438 02/12/15 0420 02/13/15 0320 02/14/15 0245  CALCIUM 7.6* 7.6* 8.0* 8.4*  MG 1.8  --   --   --   PHOS 2.5  --   --   --    Sepsis Markers No results for input(s): LATICACIDVEN, PROCALCITON, O2SATVEN in the last 168 hours. ABG  Recent Labs Lab 02/10/15 1229 02/11/15 0330  PHART 7.413 7.398  PCO2ART 39.0 39.5  PO2ART 334.0* 126*   Liver Enzymes  Recent Labs Lab 02/08/15 1823 02/12/15 0420 02/13/15 0320  AST 28 25 24   ALT 25 27 31   ALKPHOS 54 44 44  BILITOT 0.6 0.5 0.5  ALBUMIN 3.7 2.4* 2.3*   Cardiac Enzymes  Recent Labs Lab 02/11/15 2345 02/12/15 0420 02/12/15 1000  TROPONINI 0.10* 0.06* <0.03   Glucose  Recent Labs Lab 02/14/15 1233 02/14/15 1638 02/14/15 2000 02/15/15 0045 02/15/15 0336 02/15/15 0710  GLUCAP 127* 116* 121* 126* 113* 117*    Imaging Dg Chest Port 1 View  02/14/2015   CLINICAL DATA:  Intracranial hemorrhage.  EXAM: PORTABLE CHEST - 1 VIEW  COMPARISON:  02/13/2015.  FINDINGS: Endotracheal tube, left IJ line, NG tube in stable position. Stable cardiomegaly with normal  pulmonary vascularity. Bibasilar subsegmental atelectasis. No pleural effusion or pneumothorax .  IMPRESSION: 1. Lines and tubes in stable position. 2. Stable cardiomegaly. 3. Bibasilar subsegmental atelectasis with partial clearing.   Electronically Signed   By: Maisie Fushomas  Register   On: 02/14/2015 07:31     ASSESSMENT / PLAN:  PULMONARY OETT 5/9>>> 5/13 A: VDRF due to inability to protect airway, AMS ATX, improved P:   Push pulm hygiene  CARDIOVASCULAR CVL L IJ TLC 5/9>>> A: HTN in setting ICH P:  Hydralazine, metoprolol  RENAL A:  Hyponatremia on presentation Hypernatremia (intentional) Hypokalemia P:   Allow Na to drift down on low dose 0.9%  Replace K Follow BMP  GASTROINTESTINAL A:  Nutrition needs P:   PPI TF  HEMATOLOGIC A:  DVT prevention P:  Follow CBC limit phlebotomy when able  INFECTIOUS A: r/o LLL PNA, from aspiration pre ETT P:   5/11 sputum>>>few candida (not a pathogen) unasyn 5/11>>>  Complete course unasyn   ENDOCRINE A:  No diabetes by history.   P:   Monitor glu with BMET  NEUROLOGIC A:  ICH, unable to protect his airway. Improved  P:   RASS goal: 0 Will need rehab   FAMILY  - Updates: Dr Delton CoombesByrum updated family at bedside 5/14  PCCM will sign off. Please call if we can assist  Levy Pupaobert Acelyn Basham, MD, PhD 02/15/2015, 10:24 AM [facility] [phone] or if no answer [phone]

## 2015-02-16 ENCOUNTER — Inpatient Hospital Stay (HOSPITAL_COMMUNITY): Payer: Medicare Other

## 2015-02-16 DIAGNOSIS — G936 Cerebral edema: Secondary | ICD-10-CM | POA: Insufficient documentation

## 2015-02-16 DIAGNOSIS — I471 Supraventricular tachycardia: Secondary | ICD-10-CM | POA: Diagnosis not present

## 2015-02-16 LAB — BASIC METABOLIC PANEL
Anion gap: 14 (ref 5–15)
BUN: 32 mg/dL — ABNORMAL HIGH (ref 6–20)
CALCIUM: 8.4 mg/dL — AB (ref 8.9–10.3)
CO2: 29 mmol/L (ref 22–32)
CREATININE: 1.34 mg/dL — AB (ref 0.61–1.24)
Chloride: 115 mmol/L — ABNORMAL HIGH (ref 101–111)
GFR, EST AFRICAN AMERICAN: 57 mL/min — AB (ref >60)
GFR, EST NON AFRICAN AMERICAN: 49 mL/min — AB (ref >60)
GLUCOSE: 124 mg/dL — AB (ref 65–99)
Potassium: 3.3 mmol/L — ABNORMAL LOW (ref 3.5–5.1)
SODIUM: 158 mmol/L — AB (ref 135–145)

## 2015-02-16 LAB — CBC
HCT: 42.5 % (ref 39.0–52.0)
Hemoglobin: 13.7 g/dL (ref 13.0–17.0)
MCH: 28.8 pg (ref 26.0–34.0)
MCHC: 32.2 g/dL (ref 30.0–36.0)
MCV: 89.5 fL (ref 78.0–100.0)
PLATELETS: 205 10*3/uL (ref 150–400)
RBC: 4.75 MIL/uL (ref 4.22–5.81)
RDW: 15.3 % (ref 11.5–15.5)
WBC: 22.2 10*3/uL — ABNORMAL HIGH (ref 4.0–10.5)

## 2015-02-16 LAB — GLUCOSE, CAPILLARY
GLUCOSE-CAPILLARY: 125 mg/dL — AB (ref 65–99)
GLUCOSE-CAPILLARY: 108 mg/dL — AB (ref 65–99)
Glucose-Capillary: 118 mg/dL — ABNORMAL HIGH (ref 65–99)
Glucose-Capillary: 117 mg/dL — ABNORMAL HIGH (ref 65–99)
Glucose-Capillary: 104 mg/dL — ABNORMAL HIGH (ref 65–99)
Glucose-Capillary: 94 mg/dL (ref 65–99)

## 2015-02-16 LAB — URINE CULTURE
COLONY COUNT: NO GROWTH
Culture: NO GROWTH
SPECIAL REQUESTS: NORMAL

## 2015-02-16 MED ORDER — AMIODARONE LOAD VIA INFUSION
150.0000 mg | Freq: Once | INTRAVENOUS | Status: AC
  Administered 2015-02-16: 150 mg via INTRAVENOUS
  Filled 2015-02-16: qty 83.34

## 2015-02-16 MED ORDER — METOPROLOL TARTRATE 1 MG/ML IV SOLN
5.0000 mg | Freq: Once | INTRAVENOUS | Status: AC
  Administered 2015-02-16: 5 mg via INTRAVENOUS

## 2015-02-16 MED ORDER — SODIUM CHLORIDE 0.45 % IV SOLN
INTRAVENOUS | Status: DC
  Administered 2015-02-16: 30 mL/h via INTRAVENOUS
  Administered 2015-02-17: 20:00:00 via INTRAVENOUS

## 2015-02-16 MED ORDER — AMIODARONE HCL IN DEXTROSE 360-4.14 MG/200ML-% IV SOLN
30.0000 mg/h | INTRAVENOUS | Status: DC
  Administered 2015-02-16 – 2015-02-17 (×2): 30 mg/h via INTRAVENOUS
  Filled 2015-02-16 (×4): qty 200

## 2015-02-16 MED ORDER — POTASSIUM CHLORIDE 10 MEQ/50ML IV SOLN
10.0000 meq | INTRAVENOUS | Status: AC
  Administered 2015-02-16 (×4): 10 meq via INTRAVENOUS
  Filled 2015-02-16 (×4): qty 50

## 2015-02-16 MED ORDER — AMIODARONE HCL IN DEXTROSE 360-4.14 MG/200ML-% IV SOLN
60.0000 mg/h | INTRAVENOUS | Status: AC
  Administered 2015-02-16: 60 mg/h via INTRAVENOUS
  Filled 2015-02-16: qty 200

## 2015-02-16 NOTE — Progress Notes (Signed)
PULMONARY / CRITICAL CARE MEDICINE   Name: Austin Sawyer MRN: [id number] DOB: 06/27/1935    ADMISSION DATE:  02/08/2015 CONSULTATION DATE:  02/10/2015  REFERRING MD :  Neurology  CHIEF COMPLAINT:  ICH, headache and vomiting  INITIAL PRESENTATION: 80 year old male with PMH of HTN presenting with ICH on 5/7.  On 5/9 patient decompensated and was unable to protect his airway.  PCCM was called on consultation for intubation and TLC placement for 3% saline.  STUDIES:  5/9 repeat head CT with edema and ICH that was stable. 5/10 mri brain - 7.3 x 3.3 cm left occipital hematoma unchanged from the prior CT. There is intraventricular hemorrhage and subarachnoid hemorrhage also unchanged. There is mass-effect on the left occipital horn and 3 mm midline shift to the right.  5/11 - echo - 55% , mild AS by gradient only  SIGNIFICANT EVENTS: 5/7 ICH on CT and AMS. 5/9 Unable to protect his airway and PCCM consulted. 5/10- remains HTN  SUBJECTIVE:  Pt with short bursts of SVT over the last 24h, to rates as high as 200's Attempted PAnda placement but unsuccessful Currently HR in 70's  VITAL SIGNS: Temp:  [98.4 F (36.9 C)-103 F (39.4 C)] 98.5 F (36.9 C) (05/15 0800) Pulse Rate:  [33-256] 69 (05/15 0900) Resp:  [11-37] 15 (05/15 0900) BP: (86-189)/(43-143) 189/121 mmHg (05/15 0900) SpO2:  [90 %-99 %] 97 % (05/15 0900) Weight:  [92.6 kg (204 lb 2.3 oz)] 92.6 kg (204 lb 2.3 oz) (05/15 0500) HEMODYNAMICS:   VENTILATOR SETTINGS:   INTAKE / OUTPUT:  Intake/Output Summary (Last 24 hours) at 02/16/15 0925 Last data filed at 02/16/15 0900  Gross per 24 hour  Intake   1400 ml  Output    200 ml  Net   1200 ml    PHYSICAL EXAMINATION: General:  Ill appearing, comfortable Neuro: awake, more verbal and oriented this am  HEENT: poor hearing, espec on L, Op clear, poor dentition Cardiovascular:  RRR, no M Lungs:  Clear  Abdomen:  Soft, NT, ND and +BS. Musculoskeletal: no edema Skin:   Intact.  LABS:  CBC  Recent Labs Lab 02/12/15 0420 02/13/15 0320 02/16/15 0553  WBC 15.6* 13.0* 22.2*  HGB 12.3* 11.8* 13.7  HCT 38.2* 37.1* 42.5  PLT 153 158 205   Coag's No results for input(s): APTT, INR in the last 168 hours. BMET  Recent Labs Lab 02/14/15 0245  02/15/15 0820 02/15/15 1125 02/16/15 0553  NA 157*  < > 154* 152* 158*  K 3.4*  --   --  3.3* 3.3*  CL 117*  --   --  112* 115*  CO2 28  --   --  27 29  BUN 31*  --   --  24* 32*  CREATININE 1.13  --   --  1.24 1.34*  GLUCOSE 139*  --   --  120* 124*  < > = values in this interval not displayed. Electrolytes  Recent Labs Lab 02/11/15 0438  02/14/15 0245 02/15/15 1125 02/16/15 0553  CALCIUM 7.6*  < > 8.4* 8.5* 8.4*  MG 1.8  --   --   --   --   PHOS 2.5  --   --   --   --   < > = values in this interval not displayed. Sepsis Markers No results for input(s): LATICACIDVEN, PROCALCITON, O2SATVEN in the last 168 hours. ABG  Recent Labs Lab 02/10/15 1229 02/11/15 0330  PHART 7.413 7.398  PCO2ART 39.0  39.5  PO2ART 334.0* 126*   Liver Enzymes  Recent Labs Lab 02/12/15 0420 02/13/15 0320  AST 25 24  ALT 27 31  ALKPHOS 44 44  BILITOT 0.5 0.5  ALBUMIN 2.4* 2.3*   Cardiac Enzymes  Recent Labs Lab 02/11/15 2345 02/12/15 0420 02/12/15 1000  TROPONINI 0.10* 0.06* <0.03   Glucose  Recent Labs Lab 02/15/15 1223 02/15/15 1537 02/15/15 2016 02/15/15 2346 02/16/15 0312 02/16/15 0733  GLUCAP 120* 129* 103* 104* 118* 117*    Imaging Dg Abd Portable 1v  02/15/2015   CLINICAL DATA:  Feeding tube placement  EXAM: PORTABLE ABDOMEN - 1 VIEW  COMPARISON:  02/10/2015  FINDINGS: NG tube is been removed. No feeding tubes are visualized. Gas-filled colon, small bowel, and stomach are noted.  IMPRESSION: A feeding tube is not visualized in the abdomen.   Electronically Signed   By: Jolaine ClickArthur  Hoss M.D.   On: 02/15/2015 12:41     ASSESSMENT / PLAN:  PULMONARY OETT 5/9>>> 5/13 A: VDRF due to  inability to protect airway, AMS, resolved ATX, improved P:   Push pulm hygiene  CARDIOVASCULAR CVL L IJ TLC 5/9>>> A: HTN in setting ICH SVT Hx A Flutter P:  Hydralazine, metoprolol IV are ordered. Would like to change to PO (or per tube if he fails swallow eval)  Would not use diltiazem in this setting since the episodes are paroxysmal.  Will start amiodarone gtt 5/15, goal transition to Po when he can tolerate. He may not need long term, only during stress situation post CVA/bleed.  Would consider cardiology consultation if SVT refractory to meds   RENAL A:  Hyponatremia on presentation Hypernatremia (intentional) Hypokalemia P:   Allow Na to drift down, likely need to change IVF to 0.45% NS soon as Na 158 on 5/15 Replace K Follow BMP  GASTROINTESTINAL A:  Nutrition needs P:   PPI Swallow eval and then hopefully Po diet  HEMATOLOGIC A:  DVT prevention P:  Follow CBC limit phlebotomy when able  INFECTIOUS A: r/o LLL PNA, from aspiration pre ETT P:   5/11 sputum>>>few candida (not a pathogen) unasyn 5/11>>>  Complete course unasyn   ENDOCRINE A:  No diabetes by history.   P:   Monitor glu with BMET  NEUROLOGIC A:  ICH, unable to protect his airway. Improved  P:   RASS goal: 0 Will need rehab   FAMILY  - Updates: Dr Delton CoombesByrum updated family at bedside 5/14 and 5/15    Levy Pupaobert Susette Seminara, MD, PhD 02/16/2015, 9:25 AM [facility] [phone] or if no answer [phone]

## 2015-02-16 NOTE — Evaluation (Signed)
Physical Therapy Evaluation Patient Details Name: Austin Sawyer MRN: [id number] DOB: 08/28/1935 Today's Date: 02/16/2015   History of Present Illness  Austin CheLeon Meyer is an 80 y.o. male with a past medical history significant for HTN, DM type 2, CAD, MI, atrial flutter, alcohol abuse in remission, who [facility] with complains of severe HA, nausea, vision blurriness, confusion, and trouble finding words. CT revealed ICH of L occipital lobe.  Clinical Impression  Pt admitted with above. Pt with R sided weakness, severe cognitive impairments, agitation, balance impairment, confusion, and requires assist x2 for all mobility. Pt indep and driving PTA. Pt to benefit from CIR Upon d/c for maximal functional recovery to transition home with spouse.    Follow Up Recommendations CIR;Supervision/Assistance - 24 hour    Equipment Recommendations  None recommended by PT (will reassess)    Recommendations for Other Services Rehab consult     Precautions / Restrictions Precautions Precautions: Fall Precaution Comments: agitated in posey belt, bilat mittens and wrist retraints Restrictions Weight Bearing Restrictions: No      Mobility  Bed Mobility Overal bed mobility: +2 for physical assistance;Needs Assistance Bed Mobility: Supine to Sit;Sit to Supine     Supine to sit: Max assist;+2 for physical assistance Sit to supine: Max assist;+2 for physical assistance   General bed mobility comments: RN present for line managment, pt dependent for all mobility due to inability to follow commands and confusion. pt sat EOB x 2 min with maxA due to retropulsion  Transfers Overall transfer level: Needs assistance Equipment used:  (2 person lift with gait belt and pad) Transfers: Sit to/from Stand Sit to Stand: +2 physical assistance;Max assist         General transfer comment: attempted x 2 and achieved 1/2 stand, used bed pad to clear bottom to scoot to head of bed. pt with no  initiation of transfer  Ambulation/Gait                Stairs            Wheelchair Mobility    Modified Rankin (Stroke Patients Only) Modified Rankin (Stroke Patients Only) Pre-Morbid Rankin Score: No symptoms Modified Rankin: Severe disability     Balance Overall balance assessment: Needs assistance Sitting-balance support: Feet supported;No upper extremity supported Sitting balance-Leahy Scale: Poor       Standing balance-Leahy Scale: Zero                               Pertinent Vitals/Pain Pain Assessment: Faces Faces Pain Scale: Hurts little more Pain Location: R LE with mvmt Pain Descriptors / Indicators:  (unable to report)    Home Living Family/patient expects to be discharged to:: Inpatient rehab Living Arrangements: Spouse/significant other Available Help at Discharge: Family;Available 24 hours/day Type of Home: House Home Access: Level entry     Home Layout: One level Home Equipment: None Additional Comments: wife can not physically assist patient    Prior Function Level of Independence: Independent               Hand Dominance        Extremity/Trunk Assessment   Upper Extremity Assessment: Difficult to assess due to impaired cognition (pt moving bilat UE voluntarily)           Lower Extremity Assessment: RLE deficits/detail;LLE deficits/detail;Difficult to assess due to impaired cognition RLE Deficits / Details: weaker than L LE however unable to test properly,  was moving R LE voluntarily LLE Deficits / Details: was initiating mvmt but suspect grossly 3-/5, unable to formally test due to impaired ability to  comprehend and follow commands  Cervical / Trunk Assessment: Normal  Communication   Communication: Expressive difficulties;HOH (tangible, inappopriate speech)  Cognition Arousal/Alertness: Awake/alert Behavior During Therapy: Agitated;Restless Overall Cognitive Status: Impaired/Different from  baseline Area of Impairment: Orientation;Attention;Memory;Following commands;Safety/judgement;Awareness;Problem solving Orientation Level: Disoriented to;Place;Time;Situation Current Attention Level: Focused Memory: Decreased recall of precautions;Decreased short-term memory Following Commands: Follows one step commands inconsistently Safety/Judgement: Decreased awareness of safety;Decreased awareness of deficits Awareness: Intellectual Problem Solving: Slow processing;Decreased initiation;Difficulty sequencing;Requires verbal cues;Requires tactile cues General Comments: pt tangible speech, unable to identify wife in room    General Comments      Exercises        Assessment/Plan    PT Assessment Patient needs continued PT services  PT Diagnosis Difficulty walking;Generalized weakness;Altered mental status   PT Problem List Decreased strength;Decreased balance;Decreased mobility;Decreased activity tolerance;Decreased coordination;Decreased cognition;Decreased safety awareness  PT Treatment Interventions DME instruction;Gait training;Stair training;Functional mobility training;Therapeutic activities;Therapeutic exercise;Balance training;Neuromuscular re-education;Cognitive remediation;Patient/family education   PT Goals (Current goals can be found in the Care Plan section) Acute Rehab PT Goals Patient Stated Goal: didn't state PT Goal Formulation: With family Time For Goal Achievement: 03/02/15 Potential to Achieve Goals: Fair    Frequency Min 4X/week   Barriers to discharge Decreased caregiver support wife unable to physically provide assistance    Co-evaluation               End of Session Equipment Utilized During Treatment: Gait belt Activity Tolerance: Treatment limited secondary to agitation Patient left: in bed;with call bell/phone within reach;with nursing/sitter in room;with family/visitor present Nurse Communication: Mobility status         Time:  1110-1137 PT Time Calculation (min) (ACUTE ONLY): 27 min   Charges:   PT Evaluation $Initial PT Evaluation Tier I: 1 Procedure PT Treatments $Therapeutic Activity: 8-22 mins   PT G CodesMarcene Brawn:        Inette Doubrava Marie 02/16/2015, 2:59 PM   Lewis ShockAshly Ilissa Rosner, PT, DPT Pager #: [phone] Office #: [phone]

## 2015-02-16 NOTE — Progress Notes (Signed)
Came in to patient room, all lead wires were removed and L- IJ appears to be out further than it was at dressing change time this am. Meredith Pelalled ELink MD, consented for order to xray and check central line placement.  All drips stopped at this time.

## 2015-02-16 NOTE — Progress Notes (Signed)
STROKE TEAM PROGRESS NOTE   SUBJECTIVE (INTERVAL HISTORY) Daughter is at the bedside. He has transient episodes of SVT this am.Sodium is 158 and potassium is low at 3.3 Much more alert and interactive but confused dementia at baseline per daughter. Talking constantly about his past in [facility] OBJECTIVE Temp:  [98.4 F (36.9 C)-103 F (39.4 C)] 98.5 F (36.9 C) (05/15 0800) Pulse Rate:  [33-256] 69 (05/15 0900) Cardiac Rhythm:  [-] Normal sinus rhythm (05/15 0800) Resp:  [11-37] 15 (05/15 0900) BP: (86-189)/(43-143) 189/121 mmHg (05/15 0900) SpO2:  [90 %-99 %] 97 % (05/15 0900) Weight:  [204 lb 2.3 oz (92.6 kg)] 204 lb 2.3 oz (92.6 kg) (05/15 0500)   Recent Labs Lab 02/15/15 1537 02/15/15 2016 02/15/15 2346 02/16/15 0312 02/16/15 0733  GLUCAP 129* 103* 104* 118* 117*    Recent Labs Lab 02/11/15 0438  02/12/15 0420  02/13/15 0320  02/14/15 0245  02/14/15 2019 02/15/15 0150 02/15/15 0820 02/15/15 1125 02/16/15 0553  NA 141  < > 152*  < > 159*  159*  < > 157*  < > 157* 155* 154* 152* 158*  K 3.6  --  3.9  --  3.4*  --  3.4*  --   --   --   --  3.3* 3.3*  CL 109  --  118*  --  122*  --  117*  --   --   --   --  112* 115*  CO2 24  --  26  --  27  --  28  --   --   --   --  27 29  GLUCOSE 141*  --  135*  --  118*  --  139*  --   --   --   --  120* 124*  BUN 20  --  26*  --  31*  --  31*  --   --   --   --  24* 32*  CREATININE 1.09  --  1.05  --  1.13  --  1.13  --   --   --   --  1.24 1.34*  CALCIUM 7.6*  --  7.6*  --  8.0*  --  8.4*  --   --   --   --  8.5* 8.4*  MG 1.8  --   --   --   --   --   --   --   --   --   --   --   --   PHOS 2.5  --   --   --   --   --   --   --   --   --   --   --   --   < > = values in this interval not displayed.  Recent Labs Lab 02/12/15 0420 02/13/15 0320  AST 25 24  ALT 27 31  ALKPHOS 44 44  BILITOT 0.5 0.5  PROT 5.8* 6.0*  ALBUMIN 2.4* 2.3*    Recent Labs Lab 02/10/15 0227 02/11/15 0438 02/12/15 0420 02/13/15 0320  02/16/15 0553  WBC 23.7* 18.1* 15.6* 13.0* 22.2*  NEUTROABS  --   --  12.8* 10.2*  --   HGB 15.6 13.9 12.3* 11.8* 13.7  HCT 44.6 40.9 38.2* 37.1* 42.5  MCV 83.8 85.9 88.6 89.8 89.5  PLT 180 174 153 158 205    Recent Labs Lab 02/11/15 2345 02/12/15 0420 02/12/15 1000  TROPONINI 0.10* 0.06* <0.03  No results for input(s): LABPROT, INR in the last 72 hours. No results for input(s): COLORURINE, LABSPEC, PHURINE, GLUCOSEU, HGBUR, BILIRUBINUR, KETONESUR, PROTEINUR, UROBILINOGEN, NITRITE, LEUKOCYTESUR in the last 72 hours.  Invalid input(s): APPERANCEUR     Component Value Date/Time   CHOL 178 02/09/2015 0745   TRIG 121 02/12/2015 1141   HDL 62 02/09/2015 0745   CHOLHDL 2.9 02/09/2015 0745   VLDL 9 02/09/2015 0745   LDLCALC 107* 02/09/2015 0745   Lab Results  Component Value Date   HGBA1C 6.0* 02/09/2015      Component Value Date/Time   LABOPIA NONE DETECTED 02/08/2015 1823   COCAINSCRNUR NONE DETECTED 02/08/2015 1823   LABBENZ NONE DETECTED 02/08/2015 1823   AMPHETMU NONE DETECTED 02/08/2015 1823   THCU NONE DETECTED 02/08/2015 1823   LABBARB NONE DETECTED 02/08/2015 1823    No results for input(s): ETH in the last 168 hours.  I have personally reviewed the radiological images below and agree with the radiology interpretations.  Ct Head Wo Contrast 02/10/2015 - 1. No significant interval change in size of left occipital lobe intraparenchymal hematoma with slightly increased associated vasogenic edema as compared to previous. Similar trace left-to-right shift. 2. Stable to slightly decreased conspicuity of bilateral small volume subarachnoid hemorrhage. 3. Stable intraventricular hemorrhage. No hydrocephalus. 4. Persistent left subdural hematoma measuring up to 3.5 mm in maximal diameter. Blood along the left tentorium is decreased in conspicuity. 5. No new intracranial abnormality.  02/09/2015   IMPRESSION: 1. Slight enlargement of large left occipital lobe intra-axial  hemorrhage. Estimated hemorrhage volume now 58 mm. Stable left posterior hemisphere edema and mass effect. 2. Mild progression of subarachnoid extension, now bilateral. Extension and of a left subdural space with small broad-based left subdural hematoma not significantly changed. Stable intraventricular hemorrhage volume. No ventriculomegaly. 3. No new intracranial abnormality identified.      02/08/2015   IMPRESSION: 1. 6.7 cm left occipital parenchymal hemorrhage with mild surrounding edema. No midline shift. 2. Small amount of adjacent subarachnoid hemorrhage and small amount of intraventricular extension of blood. 3. Small subdural hematomas over the left cerebral convexity and left tentorium.   Dg Chest Port 1 View 02/13/15 1. Stable support apparatus. 2. Improved basilar atelectasis. 02/10/15 Tube and catheter positions as described without pneumothorax. No edema or consolidation. 02/09/2015    IMPRESSION: No acute cardiopulmonary disease.     Carotid Doppler  No evidence of significant stenosis in the right or left ICA. The left side was technically challenged due to line placement.   2D Echocardiogram  Left ventricle: The cavity size was normal. Wall thickness was increased in a pattern of moderate LVH. Systolic function was normal. The estimated ejection fraction was in the range of 55% to 60%. Doppler parameters are consistent with elevated ventricular end-diastolic filling pressure. - Aortic valve: Poorly seen Moderately calcified mild stenosis by gradients but may be underestimated due to poor CW angle. Valve area (VTI): 2.11 cm^2. Valve area (Vmax): 2.01 cm^2. Valve area (Vmean): 1.88 cm^2. - Mitral valve: There was mild regurgitation. - Left atrium: The atrium was moderately dilated. - Impressions: Cannot r/o SOE due to extremely poor image quality. Impressions: - Cannot r/o SOE due to extremely poor image quality.  MRI and MRA  7.3 x 3.3 cm left occipital hematoma  unchanged from the prior CT. There is intraventricular hemorrhage and subarachnoid hemorrhage also unchanged. There is mass-effect on the left occipital horn and 3 mm midline shift to the right. No evidence of underlying tumor or vascular  malformation. This may be related to cerebral amyloid angiography. Hypertension also possible. Negative MRA circle Willis.  EEG This EEG is abnormal with moderately severe generalized continuous nonspecific slowing of cerebral activity. No evidence of epileptic activity was recorded. The absence of epileptiform activity during an EEG recording does not, in and of itself, rule out seizure disorder, however.  EKG  NSR  PHYSICAL EXAM  Temp:  [98.4 F (36.9 C)-103 F (39.4 C)] 98.5 F (36.9 C) (05/15 0800) Pulse Rate:  [33-256] 69 (05/15 0900) Resp:  [11-37] 15 (05/15 0900) BP: (86-189)/(43-143) 189/121 mmHg (05/15 0900) SpO2:  [90 %-99 %] 97 % (05/15 0900) Weight:  [204 lb 2.3 oz (92.6 kg)] 204 lb 2.3 oz (92.6 kg) (05/15 0500)  General - Well nourished, well developed, intubated and sedated but open eyes on voice.  Ophthalmologic - fundi not visualized due to incorporation.  Cardiovascular - Regular rate and rhythm.  Neuro -  Awake alert disoriented, confused, diminished attention and recall. Speech reminiscences in [place] past in [place].Neglect to the right, not blinking to visual threat to the right. Eyes left gaze preference.h   PERRL, corneal and gag and cough reflex present. Moving all extremities, however difficult to precisely evaluate due to not following commands. LUE localizing to pain but RUE 1/5 on pain, BLE withdraw at least 2+/5 on pain stimulation. Reflexes were 1+ in all extremities and he had no pathological reflexes.  ASSESSMENT/PLAN Mr. Candise CheLeon Sardo is a 80 y.o. male with history of HTN, DM, CAD, a flutter on Coumadin before until 2010, alcohol abuse admitted for headache, nausea vomiting, confusion, blurred vision. CT had showed left  occipital ICH with small SAH, SDH and IVH. Symptoms and changed and neuro stable.    Left occipital ICH with small SAH, SDH and IVH - etiology unclear, HTN versus CAA vs hemorrhagic infarct  MRI  Stable ICH, SDH and IVH - no new bleeding and minimal midline shift   MRA  unremarkable   Repeat CT showed stable hematoma with slight extension of SAH and SDH, stable IVH  Carotid Doppler  unremarkable  2D Echo  EF 55-60%  EEG no seizure  LDL 107, not at goal  HgbA1c 6.0  heparin subq for VTE prophylaxis  Diet NPO time specified   no antithrombotic prior to admission, now on no antithrombotic due to ICH  Ongoing aggressive stroke risk factor management  Therapy recommendations:  Pending  Disposition:  Pending  Respiratory failure  Intubated on sedation  CXR did not show CHF and improved LLL infiltrates.  CCM following  Extubate as able  Hyponatremia  Concerning for SIADH vs. salt wasting initially  Sodium from 143-> 131 -> 138 -> 145->154->159->156-152  Off 3% saline  Na goal normalization now  Diabetes  HbA1c 6.0, goal < 7.0  Controlled  CBG monitoring  SSI  Hypertension  Home meds:  Coreg, losartan, BP goal < 160 Currently on hydralazine and metoprolol  Better controlled  Close monitoring  claviprex in needed   Hyperlipidemia  Home meds:   Vytorin  LDL 107, goal < 70  Statin on hold now due to ICH  Continue statin at discharge  A. flutter  Patient history of a flutter  Was on Coumadin in the past  no anticoagulation or antiplatelet PTA  No a flutter on telemetry  Patient may not be a candidate for anticoagulation  Other Stroke Risk Factors  Advanced age  ETOH use  Obesity, Body mass index is 32.97 kg/(m^2).   Other Active  Problems  Mild hypokalemia  Leukocytosis  Other Pertinent History    Hospital day # 8 Lond d/w daughter at bedside about prognosis, plan of care and answered questions.Mobilize out of bed  today. ST swallow eval   today .will have PCCM address SVT and low potassium This patient is critically ill due to respiratory failure, large left occipital ICH with SAH and IVH, hyponatremia and at significant risk of neurological worsening, death form respiratory failure, rebleeding, cerebral edema, brain herniation, and status epilepticus. This patient's care requires constant monitoring of vital signs, hemodynamics, respiratory and cardiac monitoring, review of multiple databases, neurological assessment, discussion with family, other specialists and medical decision making of high complexity. I spent 30 minutes of neurocritical care time in the care of this patient.  Delia Heady, MD Stroke Neurology 02/16/2015 10:21 AM    To contact Stroke Continuity provider, please refer to [email or web address]. After hours, contact General Neurology

## 2015-02-16 NOTE — Progress Notes (Signed)
[id number] - Pt in SVT again, this time sustained above 170 bpm for 220 seconds, highest heart rate reached 222.  Metoprolol already administered. Pt remains un symptomatic.  Austin Sawyer

## 2015-02-16 NOTE — Evaluation (Signed)
Clinical/Bedside Swallow Evaluation Patient Details  Name: Austin Sawyer MRN: [id number] Date of Birth: 08/19/1935  Today's Date: 02/16/2015 Time: SLP Start Time (ACUTE ONLY): 1317 SLP Stop Time (ACUTE ONLY): 1334 SLP Time Calculation (min) (ACUTE ONLY): 17 min  Past Medical History:  Past Medical History  Diagnosis Date  . Hypertension   . Coronary artery disease   . Diabetes mellitus without complication   . MI, old   . Reflux   . Depression   . Cervicalgia   . Disturbance of skin sensation   . Coronary atherosclerosis of unspecified type of vessel, native or graft   . Unspecified hearing loss   . Unspecified sleep apnea   . Alcohol abuse, in remission   . Atrial flutter    Past Surgical History:  Past Surgical History  Procedure Laterality Date  . Hernia repair    . Rotator cuff repair     HPI:  Mr. Austin CheLeon Rocco is a 80 y.o. male with history of HTN, DM, CAD, a flutter on Coumadin before until 2010, alcohol abuse admitted for headache, nausea vomiting, confusion, blurred vision. CT had showed left occipital ICH with small SAH, SDH and IVH. He was intubated 5/9-5/13.   Assessment / Plan / Recommendation Clinical Impression  Pt's current mentation largely impacts his swallowing function, with fleeting sustained attention to boluses despite Max cues from SLP and utilization of family members as therapeutic agents. Immediate coughing was noted with all consistencies tested. Recommend pt remains NPO. Will continue to follow for readiness for POs with improvements in cognitive function - recommend SLP cognitive-linguistic evaluation as well.    Aspiration Risk  Severe    Diet Recommendation NPO;Alternative means - temporary   Medication Administration: Via alternative means    Other  Recommendations Oral Care Recommendations: Oral care QID   Follow Up Recommendations       Frequency and Duration    2 weeks   Pertinent Vitals/Pain WFL    SLP Swallow Goals     Swallow  Study Prior Functional Status       General Other Pertinent Information: Mr. Austin CheLeon Penick is a 80 y.o. male with history of HTN, DM, CAD, a flutter on Coumadin before until 2010, alcohol abuse admitted for headache, nausea vomiting, confusion, blurred vision. CT had showed left occipital ICH with small SAH, SDH and IVH. He was intubated 5/9-5/13. Type of Study: Bedside swallow evaluation Previous Swallow Assessment: none in chart Diet Prior to this Study: NPO;Other (Comment) (pt pulled NGT) Temperature Spikes Noted: Yes (103) Respiratory Status: Room air History of Recent Intubation: Yes Length of Intubations (days): 13 days Date extubated: 02/14/15 Behavior/Cognition: Alert;Cooperative;Requires cueing Self-Feeding Abilities: Needs assist Patient Positioning: Upright in bed Baseline Vocal Quality: Normal    Oral/Motor/Sensory Function     Ice Chips Ice chips: Impaired Presentation: Spoon Oral Phase Impairments: Poor awareness of bolus Pharyngeal Phase Impairments: Cough - Immediate   Thin Liquid Thin Liquid: Not tested    Nectar Thick Nectar Thick Liquid: Not tested   Honey Thick Honey Thick Liquid: Not tested   Puree Puree: Impaired Presentation: Spoon Oral Phase Impairments: Poor awareness of bolus Oral Phase Functional Implications: Oral holding Pharyngeal Phase Impairments: Cough - Immediate   Solid    Solid: Not tested      Maxcine HamLaura Paiewonsky, M.A. CCC-SLP [phone]  Maxcine Hamaiewonsky, Nikisha Fleece 02/16/2015,2:10 PM

## 2015-02-16 NOTE — Progress Notes (Signed)
Pt went into SVT ranging up to 210/min, sustained for no longer than 4-5 seconds at this rate, but maintained in the 150's for 10-15 seconds. Pt is asymptomatic.  Spoke with Dave-Stroke NP and gave a one time order for lopressor, see MAR  Austin Sawyer

## 2015-02-17 DIAGNOSIS — I471 Supraventricular tachycardia: Secondary | ICD-10-CM

## 2015-02-17 DIAGNOSIS — G9340 Encephalopathy, unspecified: Secondary | ICD-10-CM

## 2015-02-17 LAB — BASIC METABOLIC PANEL
ANION GAP: 13 (ref 5–15)
BUN: 35 mg/dL — AB (ref 6–20)
CHLORIDE: 122 mmol/L — AB (ref 101–111)
CO2: 27 mmol/L (ref 22–32)
CREATININE: 1.52 mg/dL — AB (ref 0.61–1.24)
Calcium: 8.3 mg/dL — ABNORMAL LOW (ref 8.9–10.3)
GFR calc Af Amer: 49 mL/min — ABNORMAL LOW (ref >60)
GFR calc non Af Amer: 42 mL/min — ABNORMAL LOW (ref >60)
Glucose, Bld: 97 mg/dL (ref 65–99)
POTASSIUM: 3.3 mmol/L — AB (ref 3.5–5.1)
Sodium: 162 mmol/L (ref 135–145)

## 2015-02-17 LAB — PROCALCITONIN: PROCALCITONIN: 0.11 ng/mL

## 2015-02-17 LAB — MAGNESIUM: Magnesium: 2.7 mg/dL — ABNORMAL HIGH (ref 1.7–2.4)

## 2015-02-17 LAB — GLUCOSE, CAPILLARY
GLUCOSE-CAPILLARY: 94 mg/dL (ref 65–99)
GLUCOSE-CAPILLARY: 90 mg/dL (ref 65–99)
Glucose-Capillary: 95 mg/dL (ref 65–99)
Glucose-Capillary: 97 mg/dL (ref 65–99)
Glucose-Capillary: 87 mg/dL (ref 65–99)

## 2015-02-17 MED ORDER — DEXMEDETOMIDINE HCL IN NACL 200 MCG/50ML IV SOLN
0.4000 ug/kg/h | INTRAVENOUS | Status: DC
  Administered 2015-02-17 (×2): 0.4 ug/kg/h via INTRAVENOUS
  Administered 2015-02-18: 0.3 ug/kg/h via INTRAVENOUS
  Administered 2015-02-18: 0.4 ug/kg/h via INTRAVENOUS
  Administered 2015-02-18: 0.3 ug/kg/h via INTRAVENOUS
  Administered 2015-02-18: 0.4 ug/kg/h via INTRAVENOUS
  Administered 2015-02-19: 0.5 ug/kg/h via INTRAVENOUS
  Filled 2015-02-17 (×8): qty 50

## 2015-02-17 MED ORDER — METOPROLOL TARTRATE 1 MG/ML IV SOLN: 3.0000 mg | Freq: Four times a day (QID) | INTRAVENOUS | Status: DC

## 2015-02-17 MED ORDER — POTASSIUM CHLORIDE 10 MEQ/50ML IV SOLN
10.0000 meq | INTRAVENOUS | Status: AC
  Administered 2015-02-17 (×2): 10 meq via INTRAVENOUS
  Filled 2015-02-17 (×2): qty 50

## 2015-02-17 MED ORDER — METOPROLOL TARTRATE 1 MG/ML IV SOLN
3.0000 mg | Freq: Four times a day (QID) | INTRAVENOUS | Status: DC
  Filled 2015-02-17 (×4): qty 5

## 2015-02-17 NOTE — Progress Notes (Signed)
eLink Physician-Brief Progress Note Patient Name: Austin Sawyer DOB: 09/19/1935 MRN: [id number]   Date of Service  02/17/2015  HPI/Events of Note  Bedside nurse requests restraint order renewed.   eICU Interventions  Will renew restraint order.     Intervention Category Minor Interventions: Routine modifications to care plan (e.g. PRN medications for pain, fever)  Sommer,Steven Eugene 02/17/2015, 7:56 PM

## 2015-02-17 NOTE — Progress Notes (Signed)
CRITICAL VALUE ALERT  Critical value received:  Na 162  Date of notification:  02/17/15  Time of notification:  1149  Critical value read back:Yes.    Nurse who received alert:  Verlin DikeKimberly Annet Manukyan   MD notified (1st page):  Dr. Marchelle Gearingamaswamy   Time of first page:  1150  MD notified (2nd page): Dr. Pearlean BrownieSethi  Time of second page: 1154  Responding MD:  Dr. Pearlean BrownieSethi   Time MD responded:  [phone]

## 2015-02-17 NOTE — Progress Notes (Signed)
Rehab Admissions Coordinator Note:  Patient was screened by Austin Sawyer, Austin Sawyer for appropriateness for an Inpatient Acute Rehab Consult per PT recommendation.  At this time, we are recommending Inpatient Rehab consult.  Austin Sawyer, [name] 02/17/2015, 8:09 AM  I can be reached at [phone].

## 2015-02-17 NOTE — Progress Notes (Signed)
Nutrition Follow-up  DOCUMENTATION CODES:  Obesity unspecified  INTERVENTION:  If pt unable to pass swallow eval recommend:  Jevity 1.2 @ 65 ml/hr 30 ml Prostat daily Provides: 1972 kcal, 101 grams protein, and 1263 ml H2O  If able to start diet will supplement as appropriate.    NUTRITION DIAGNOSIS:  Inadequate oral intake related to inability to eat as evidenced by NPO status.  ongoing  GOAL:  Patient will meet greater than or equal to 90% of their needs  Not met.   MONITOR:  Diet advancement, PO intake, Labs, Weight trends  REASON FOR ASSESSMENT:  Consult Enteral/tube feeding initiation and management  ASSESSMENT:  Pt admitted as a code stroke, [place] on CT.  Pt decompensated 5/9 and intubated.  Pt extubated 5/13 staff unable to re-place feeding tube. Failed SLP eval 5/15, SLP re-eval today per RN.  Pt discussed during ICU rounds and with RN.  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 Pt confused currently.    Labs reviewed:  Na 158 - 3% stopped Potassium low BUN/Cr elevated  Medications reviewed and include: senokot-S, MVI, Protonix, miralax    Height:  Ht Readings from Last 1 Encounters:  02/08/15 '5\' 6"'  (1.676 m)    Weight:  Wt Readings from Last 1 Encounters:  02/17/15 209 lb 7 oz (95 kg)    Ideal Body Weight:  64.5 kg  Wt Readings from Last 10 Encounters:  02/17/15 209 lb 7 oz (95 kg)  03/15/14 227 lb (102.967 kg)  09/14/13 220 lb (99.791 kg)  07/12/13 217 lb (98.431 kg)  04/27/13 213 lb (96.616 kg)  04/22/13 220 lb (99.791 kg)  10/04/12 220 lb (99.791 kg)  06/23/12 219 lb (99.338 kg)  06/19/10 221 lb (100.245 kg)  12/09/09 240 lb (108.863 kg)    BMI:  Body mass index is 33.82 kg/(m^2).  Estimated Nutritional Needs:  Kcal:  3833-3832  Protein:  >/= 129 grams  Fluid:  > 1.5 L/day  Skin:  Reviewed, no issues  Diet Order:  Diet NPO time specified  EDUCATION NEEDS:  No education needs  identified at this time   Intake/Output Summary (Last 24 hours) at 02/17/15 1103 Last data filed at 02/17/15 0800  Gross per 24 hour  Intake 1795.58 ml  Output      0 ml  Net 1795.58 ml    Last BM:  5/14  [place], [place], [place] Pager [phone] After Hours Pager

## 2015-02-17 NOTE — Progress Notes (Addendum)
ANTIBIOTIC CONSULT NOTE - FOLLOW UP  Pharmacy Consult for Unasyn Indication: aspiration pneumonia  No Known Allergies  Patient Measurements: Height: 5\' 6"  (167.6 cm) Weight: 209 lb 7 oz (95 kg) IBW/kg (Calculated) : 63.8  Vital Signs: Temp: 98.7 F (37.1 C) (05/16 0400) Temp Source: Oral (05/16 0400) BP: 168/126 mmHg (05/16 1100) Pulse Rate: 82 (05/16 1000) Intake/Output from previous day: 05/15 0701 - 05/16 0700 In: 1928.9 [I.V.:1378.9; IV Piggyback:550] Out: -  Intake/Output from this shift: Total I/O In: 288.4 [I.V.:188.4; IV Piggyback:100] Out: -   Labs:  Recent Labs  02/15/15 1125 02/16/15 0553 02/17/15 1013  WBC  --  22.2*  --   HGB  --  13.7  --   PLT  --  205  --   CREATININE 1.24 1.34* 1.52*   Estimated Creatinine Clearance: 43.2 mL/min (by C-G formula based on Cr of 1.52).  Assessment:  Day # 7 of Unasyn for aspiration pneumonia coverage.  Patient is now afebrile and wbc is within normal limits.  SCr is rising at 1.52 with estimated CrCl ~40-45 mL/min.   *Note sodium level up in 160s- 3g Unasyn provides 230mg  of sodium per dose (920mg  Na/day)*   Goal of Therapy:  appropriate Unasyn dose for renal function and infection  Plan:   Continue Unasyn 3 gm IV q6hrs.  Follow renal function, progress, and duration of therapy. *MD- please consider stopping therapy as this is day 7 of therapy and may be contributing to high sodium.    Link SnufferJessica Bethel Gaglio, PharmD, BCPS Clinical Pharmacist [phone]  02/17/2015,1:08 PM   Addendum: Discussed with Dr. Marchelle Gearingamaswamy - as providing patient with 920mg /day of sodium and day 7 of therapy, we will stop Unasyn.  Plan: Dr. Marchelle Gearingamaswamy to re-address antibiotics in AM.   Link SnufferJessica Zoii Florer, PharmD, BCPS Clinical Pharmacist [phone]. 02/17/2015, 1:40 PM

## 2015-02-17 NOTE — Progress Notes (Signed)
Physical Therapy Treatment Patient Details Name: Austin Sawyer MRN: [id number] DOB: 11/17/1934 Today's Date: 02/17/2015    History of Present Illness Austin CheLeon Lembcke is an 80 y.o. male with a past medical history significant for HTN, DM type 2, CAD, MI, atrial flutter, alcohol abuse in remission, who [facility] with complains of severe HA, nausea, vision blurriness, confusion, and trouble finding words. CT revealed ICH of L occipital lobe.    PT Comments    Progressing as expected, improved from yesterday.  Participative.  Emphasis on sitting and standing balance.   Follow Up Recommendations  CIR;Supervision/Assistance - 24 hour     Equipment Recommendations  None recommended by PT    Recommendations for Other Services Rehab consult     Precautions / Restrictions Precautions Precautions: Fall    Mobility  Bed Mobility Overal bed mobility: Needs Assistance;+2 for physical assistance Bed Mobility: Supine to Sit     Supine to sit: Max assist;+2 for physical assistance Sit to supine: Mod assist;+2 for physical assistance   General bed mobility comments: maximal assist to reposition in the bed.  pt able to do more for himself by the end ofthe session  Transfers Overall transfer level: Needs assistance Equipment used:  (chair back) Transfers: Sit to/from Stand Sit to Stand: Max assist;+2 physical assistance         General transfer comment: 3 trials of stand.  assist needed for coming forward, lift assist and support at R knee.    Ambulation/Gait                 Stairs            Wheelchair Mobility    Modified Rankin (Stroke Patients Only) Modified Rankin (Stroke Patients Only) Pre-Morbid Rankin Score: No symptoms Modified Rankin: Severe disability     Balance Overall balance assessment: Needs assistance Sitting-balance support: No upper extremity supported;Single extremity supported Sitting balance-Leahy Scale: Poor Sitting balance  - Comments: initially need moderate assist to maintain balance.  After time spent w/shifting,and scooting, pt could sit for10 to 15 seconds without assist.   Standing balance support: Bilateral upper extremity supported Standing balance-Leahy Scale: Zero Standing balance comment: 3 standing trials.  Assist for coming forward, lift and R knee support.  pt able to stand fully upright with R knee support, holding to the back of the recliner chair.                    Cognition Arousal/Alertness: Awake/alert Behavior During Therapy: WFL for tasks assessed/performed Overall Cognitive Status: Impaired/Different from baseline Area of Impairment: Orientation;Attention;Memory;Following commands;Safety/judgement;Awareness;Problem solving Orientation Level: Disoriented to;Place;Time;Situation Current Attention Level: Focused Memory: Decreased short-term memory Following Commands: Follows one step commands with increased time   Awareness: Intellectual Problem Solving: Slow processing General Comments: intelligible speech    Exercises      General Comments        Pertinent Vitals/Pain Pain Assessment: Faces Faces Pain Scale: No hurt    Home Living                      Prior Function            PT Goals (current goals can now be found in the care plan section) Acute Rehab PT Goals PT Goal Formulation: With family Time For Goal Achievement: 03/02/15 Potential to Achieve Goals: Fair Progress towards PT goals: Progressing toward goals    Frequency  Min 4X/week    PT Plan Current  plan remains appropriate    Co-evaluation             End of Session   Activity Tolerance: Patient tolerated treatment well;Patient limited by fatigue Patient left: in bed;with call bell/phone within reach;with bed alarm set;Other (comment) (SLP therapist in room)     Time: [phone] PT Time Calculation (min) (ACUTE ONLY): 35 min  Charges:  $Therapeutic Activity: 23-37  mins                    G Codes:      Chrystal Zeimet, [name] 02/17/2015, 4:15 PM 02/17/2015  [place] BingKen Amariyah Bazar, PT [phone]  (pager)

## 2015-02-17 NOTE — Progress Notes (Signed)
STROKE TEAM PROGRESS NOTE   SUBJECTIVE (INTERVAL HISTORY) RN is at the bedside. He has not had furthert episodes of SVT  .Sodium  and potassium are pending  Much more alert and interactive but confused dementia at baseline per daughter.   OBJECTIVE Temp:  [98.4 F (36.9 C)-98.7 F (37.1 C)] 98.7 F (37.1 C) (05/16 0400) Pulse Rate:  [60-180] 80 (05/16 0800) Cardiac Rhythm:  [-] Normal sinus rhythm (05/16 0800) Resp:  [11-31] 14 (05/16 0800) BP: (111-189)/(71-126) 138/94 mmHg (05/16 0800) SpO2:  [60 %-100 %] 100 % (05/16 0800) Weight:  [209 lb 7 oz (95 kg)] 209 lb 7 oz (95 kg) (05/16 0500)   Recent Labs Lab 02/16/15 1704 02/16/15 1952 02/16/15 2318 02/17/15 0351 02/17/15 0758  GLUCAP 108* 104* 94 95 94    Recent Labs Lab 02/11/15 0438  02/12/15 0420  02/13/15 0320  02/14/15 0245  02/14/15 2019 02/15/15 0150 02/15/15 0820 02/15/15 1125 02/16/15 0553  NA 141  < > 152*  < > 159*  159*  < > 157*  < > 157* 155* 154* 152* 158*  K 3.6  --  3.9  --  3.4*  --  3.4*  --   --   --   --  3.3* 3.3*  CL 109  --  118*  --  122*  --  117*  --   --   --   --  112* 115*  CO2 24  --  26  --  27  --  28  --   --   --   --  27 29  GLUCOSE 141*  --  135*  --  118*  --  139*  --   --   --   --  120* 124*  BUN 20  --  26*  --  31*  --  31*  --   --   --   --  24* 32*  CREATININE 1.09  --  1.05  --  1.13  --  1.13  --   --   --   --  1.24 1.34*  CALCIUM 7.6*  --  7.6*  --  8.0*  --  8.4*  --   --   --   --  8.5* 8.4*  MG 1.8  --   --   --   --   --   --   --   --   --   --   --   --   PHOS 2.5  --   --   --   --   --   --   --   --   --   --   --   --   < > = values in this interval not displayed.  Recent Labs Lab 02/12/15 0420 02/13/15 0320  AST 25 24  ALT 27 31  ALKPHOS 44 44  BILITOT 0.5 0.5  PROT 5.8* 6.0*  ALBUMIN 2.4* 2.3*    Recent Labs Lab 02/11/15 0438 02/12/15 0420 02/13/15 0320 02/16/15 0553  WBC 18.1* 15.6* 13.0* 22.2*  NEUTROABS  --  12.8* 10.2*  --   HGB  13.9 12.3* 11.8* 13.7  HCT 40.9 38.2* 37.1* 42.5  MCV 85.9 88.6 89.8 89.5  PLT 174 153 158 205    Recent Labs Lab 02/11/15 2345 02/12/15 0420 02/12/15 1000  TROPONINI 0.10* 0.06* <0.03   No results for input(s): LABPROT, INR in the last 72 hours. No results for input(s):  COLORURINE, LABSPEC, PHURINE, GLUCOSEU, HGBUR, BILIRUBINUR, KETONESUR, PROTEINUR, UROBILINOGEN, NITRITE, LEUKOCYTESUR in the last 72 hours.  Invalid input(s): APPERANCEUR     Component Value Date/Time   CHOL 178 02/09/2015 0745   TRIG 121 02/12/2015 1141   HDL 62 02/09/2015 0745   CHOLHDL 2.9 02/09/2015 0745   VLDL 9 02/09/2015 0745   LDLCALC 107* 02/09/2015 0745   Lab Results  Component Value Date   HGBA1C 6.0* 02/09/2015      Component Value Date/Time   LABOPIA NONE DETECTED 02/08/2015 1823   COCAINSCRNUR NONE DETECTED 02/08/2015 1823   LABBENZ NONE DETECTED 02/08/2015 1823   AMPHETMU NONE DETECTED 02/08/2015 1823   THCU NONE DETECTED 02/08/2015 1823   LABBARB NONE DETECTED 02/08/2015 1823    No results for input(s): ETH in the last 168 hours.  I have personally reviewed the radiological images below and agree with the radiology interpretations.  Ct Head Wo Contrast 02/10/2015 - 1. No significant interval change in size of left occipital lobe intraparenchymal hematoma with slightly increased associated vasogenic edema as compared to previous. Similar trace left-to-right shift. 2. Stable to slightly decreased conspicuity of bilateral small volume subarachnoid hemorrhage. 3. Stable intraventricular hemorrhage. No hydrocephalus. 4. Persistent left subdural hematoma measuring up to 3.5 mm in maximal diameter. Blood along the left tentorium is decreased in conspicuity. 5. No new intracranial abnormality.  02/09/2015   IMPRESSION: 1. Slight enlargement of large left occipital lobe intra-axial hemorrhage. Estimated hemorrhage volume now 58 mm. Stable left posterior hemisphere edema and mass effect. 2. Mild  progression of subarachnoid extension, now bilateral. Extension and of a left subdural space with small broad-based left subdural hematoma not significantly changed. Stable intraventricular hemorrhage volume. No ventriculomegaly. 3. No new intracranial abnormality identified.      02/08/2015   IMPRESSION: 1. 6.7 cm left occipital parenchymal hemorrhage with mild surrounding edema. No midline shift. 2. Small amount of adjacent subarachnoid hemorrhage and small amount of intraventricular extension of blood. 3. Small subdural hematomas over the left cerebral convexity and left tentorium.   Dg Chest Port 1 View 02/13/15 1. Stable support apparatus. 2. Improved basilar atelectasis. 02/10/15 Tube and catheter positions as described without pneumothorax. No edema or consolidation. 02/09/2015    IMPRESSION: No acute cardiopulmonary disease.     Carotid Doppler  No evidence of significant stenosis in the right or left ICA. The left side was technically challenged due to line placement.   2D Echocardiogram  Left ventricle: The cavity size was normal. Wall thickness was increased in a pattern of moderate LVH. Systolic function was normal. The estimated ejection fraction was in the range of 55% to 60%. Doppler parameters are consistent with elevated ventricular end-diastolic filling pressure. - Aortic valve: Poorly seen Moderately calcified mild stenosis by gradients but may be underestimated due to poor CW angle. Valve area (VTI): 2.11 cm^2. Valve area (Vmax): 2.01 cm^2. Valve area (Vmean): 1.88 cm^2. - Mitral valve: There was mild regurgitation. - Left atrium: The atrium was moderately dilated. - Impressions: Cannot r/o SOE due to extremely poor image quality. Impressions: - Cannot r/o SOE due to extremely poor image quality.  MRI and MRA  7.3 x 3.3 cm left occipital hematoma unchanged from the prior CT. There is intraventricular hemorrhage and subarachnoid hemorrhage also unchanged. There  is mass-effect on the left occipital horn and 3 mm midline shift to the right. No evidence of underlying tumor or vascular malformation. This may be related to cerebral amyloid angiography. Hypertension also possible. Negative MRA circle  Willis.  EEG This EEG is abnormal with moderately severe generalized continuous nonspecific slowing of cerebral activity. No evidence of epileptic activity was recorded. The absence of epileptiform activity during an EEG recording does not, in and of itself, rule out seizure disorder, however.  EKG  NSR  PHYSICAL EXAM  Temp:  [98.4 F (36.9 C)-98.7 F (37.1 C)] 98.7 F (37.1 C) (05/16 0400) Pulse Rate:  [60-180] 80 (05/16 0800) Resp:  [11-31] 14 (05/16 0800) BP: (111-189)/(71-126) 138/94 mmHg (05/16 0800) SpO2:  [60 %-100 %] 100 % (05/16 0800) Weight:  [209 lb 7 oz (95 kg)] 209 lb 7 oz (95 kg) (05/16 0500)  General - Well nourished, well developed, intubated and sedated but open eyes on voice.  Ophthalmologic - fundi not visualized due to incorporation.  Cardiovascular - Regular rate and rhythm.  Neuro -  Awake alert disoriented, confused, diminished attention and recall. Speech tangentialn.Neglect to the right, not blinking to visual threat to the right. Eyes left gaze preference.h   PERRL, corneal and gag and cough reflex present. Moving all extremities, however difficult to precisely evaluate due to not following commands. LUE localizing to pain but RUE 1/5 on pain, BLE withdraw at least 2+/5 on pain stimulation. Reflexes were 1+ in all extremities and he had no pathological reflexes.  ASSESSMENT/PLAN Mr. Eathon Valade is a 80 y.o. male with history of HTN, DM, CAD, a flutter on Coumadin before until 2010, alcohol abuse admitted for headache, nausea vomiting, confusion, blurred vision. CT had showed left occipital ICH with small SAH, SDH and IVH. Symptoms and changed and neuro stable.    Left occipital ICH with small SAH, SDH and IVH - etiology  unclear, HTN versus CAA vs hemorrhagic infarct  MRI  Stable ICH, SDH and IVH - no new bleeding and minimal midline shift   MRA  unremarkable   Repeat CT showed stable hematoma with slight extension of SAH and SDH, stable IVH  Carotid Doppler  unremarkable  2D Echo  EF 55-60%  EEG no seizure  LDL 107, not at goal  HgbA1c 6.0  heparin subq for VTE prophylaxis  Diet NPO time specified   no antithrombotic prior to admission, now on no antithrombotic due to ICH  Ongoing aggressive stroke risk factor management  Therapy recommendations:  Pending  Disposition:  Pending  Respiratory failure  Intubated on sedation  CXR did not show CHF and improved LLL infiltrates.  CCM following  Extubate as able  Hyponatremia  Concerning for SIADH vs. salt wasting initially  Sodium from 143-> 131 -> 138 -> 145->154->159->156-152  Off 3% saline  Na goal normalization now  Diabetes  HbA1c 6.0, goal < 7.0  Controlled  CBG monitoring  SSI  Hypertension  Home meds:  Coreg, losartan, BP goal < 160 Currently on hydralazine and metoprolol  Better controlled  Close monitoring  claviprex in needed   Hyperlipidemia  Home meds:   Vytorin  LDL 107, goal < 70  Statin on hold now due to ICH  Continue statin at discharge  A. flutter  Patient history of a flutter  Was on Coumadin in the past  no anticoagulation or antiplatelet PTA  No a flutter on telemetry  Patient may not be a candidate for anticoagulation  Other Stroke Risk Factors  Advanced age  ETOH use  Obesity, Body mass index is 33.82 kg/(m^2).   Other Active Problems  Mild hypokalemia  Leukocytosis  Other Pertinent History    Hospital day # 9  This patient  is critically ill due to respiratory failure, large left occipital ICH with SAH and IVH, hyponatremia and at significant risk of neurological worsening, death form respiratory failure, rebleeding, cerebral edema, brain herniation, and  status epilepticus. This patient's care requires constant monitoring of vital signs, hemodynamics, respiratory and cardiac monitoring, review of multiple databases, neurological assessment, discussion with family, other specialists and medical decision making of high complexity. I spent 30 minutes of neurocritical care time in the care of this patient. Mobilize out of bed.ST swallow eval. PT/OT consults Delia HeadyPramod Brigido Mera, MD Stroke Neurology 02/17/2015 8:26 AM    To contact Stroke Continuity provider, please refer to [email or web address]. After hours, contact General Neurology

## 2015-02-17 NOTE — Progress Notes (Signed)
UR completed.  Pt not medically ready for CIR at this time due to Amiodarone gtt.  Carlyle LipaMichelle Fermin Yan, RN BSN MHA CCM Trauma/Neuro ICU Case Manager [phone]

## 2015-02-17 NOTE — Consult Note (Signed)
Physical Medicine and Rehabilitation Consult Reason for Consult: Left occipital ICH with small SAH, SDH Referring Physician: Dr.Xu   HPI: Austin Sawyer is a 80 y.o. right handed male with history of hypertension, diabetes mellitus peripheral neuropathy, hearing impairment, coronary artery disease with myocardial infarction. Patient independent and driving prior to admission living with his wife. Admitted 02/08/2015 with severe headache, nausea, blurred vision and altered mental status. No report of trauma. CT of the head showed a 6.7 m left occipital parenchymal hemorrhage with mild surrounding edema. No midline shift. Small amount of adjacent subarachnoid hemorrhage and small amount of intraventricular extension of blood. Small subdural hematomas over the left cerebral convexity and left tentorium. Follow-up MRI showed 7.3 x 3.3 left occipital hematoma unchanged from prior CT. There was mass effect on the left occipital horn and 3 mm midline shift to the right. MRA of the head negative. Patient unable to maintain airway with intubation 02/10/2015. Carotid Dopplers with no ICA stenosis. Echocardiogram with ejection fraction of 55% without emboli. Neurology follow-up with conservative care. Hospital course with bursts of SVT and monitored closely. Patient was extubated 02/14/2015. Ongoing bouts of confusion and restlessness. Remains nothing by mouth and await speech therapy for swallowing evaluation. Subcutaneous heparin added for DVT prophylaxis 02/12/2015. Physical therapy evaluation completed 02/16/2015 with recommendations of physical medicine rehabilitation consult.   Review of Systems  HENT: Positive for hearing loss.   Gastrointestinal:       GERD  Musculoskeletal: Positive for myalgias and joint pain.  Psychiatric/Behavioral: Positive for depression.   Past Medical History  Diagnosis Date  . Hypertension   . Coronary artery disease   . Diabetes mellitus without complication   . MI,  old   . Reflux   . Depression   . Cervicalgia   . Disturbance of skin sensation   . Coronary atherosclerosis of unspecified type of vessel, native or graft   . Unspecified hearing loss   . Unspecified sleep apnea   . Alcohol abuse, in remission   . Atrial flutter    Past Surgical History  Procedure Laterality Date  . Hernia repair    . Rotator cuff repair     Family History  Problem Relation Age of Onset  . Anxiety disorder Mother   . Cancer Mother    Social History:  reports that he has quit smoking. He has never used smokeless tobacco. He reports that he does not drink alcohol or use illicit drugs. Allergies: No Known Allergies Medications Prior to Admission  Medication Sig Dispense Refill  . allopurinol (ZYLOPRIM) 300 MG tablet Take 300 mg by mouth daily.    . carvedilol (COREG) 25 MG tablet Take 37.5 mg by mouth 2 (two) times daily with a meal.    . colchicine 0.6 MG tablet Take 1 tablet (0.6 mg total) by mouth daily. 30 tablet 0  . DULoxetine (CYMBALTA) 20 MG capsule Take 40 mg by mouth daily.    Marland Kitchen gabapentin (NEURONTIN) 300 MG capsule Take 300 mg by mouth daily.    Marland Kitchen losartan (COZAAR) 100 MG tablet Take 100 mg by mouth daily.    . metFORMIN (GLUCOPHAGE-XR) 500 MG 24 hr tablet Take 1 tablet (500 mg total) by mouth daily with breakfast. 30 tablet 0  . omeprazole (PRILOSEC) 20 MG capsule Take 20 mg by mouth daily.    . valsartan-hydrochlorothiazide (DIOVAN-HCT) 160-12.5 MG per tablet Take 1 tablet by mouth daily.    Marland Kitchen darifenacin (ENABLEX) 7.5 MG 24 hr tablet  Take 1 tablet (7.5 mg total) by mouth daily. (Patient not taking: Reported on 11/20/2014) 30 tablet 0  . DULoxetine (CYMBALTA) 60 MG capsule Take 1 capsule (60 mg total) by mouth daily. (Patient not taking: Reported on 11/20/2014) 60 capsule 12  . ezetimibe-simvastatin (VYTORIN) 10-80 MG per tablet Take 1 tablet by mouth at bedtime. (Patient not taking: Reported on 11/20/2014) 30 tablet 0  . hydrOXYzine (VISTARIL) 50 MG  capsule Take 1 capsule (50 mg total) by mouth at bedtime. (Patient not taking: Reported on 11/20/2014) 30 capsule 0  . mirtazapine (REMERON) 15 MG tablet Take 1 tablet (15 mg total) by mouth at bedtime as needed (insomia). (Patient not taking: Reported on 11/20/2014) 30 tablet 0  . nitroGLYCERIN (NITROSTAT) 0.4 MG SL tablet Place 0.4 mg under the tongue every 5 (five) minutes as needed. Chest pain    . solifenacin (VESICARE) 5 MG tablet Take 2 tablets (10 mg total) by mouth daily. (Patient not taking: Reported on 11/20/2014) 60 tablet 0  . tamsulosin (FLOMAX) 0.4 MG CAPS Take 1 capsule (0.4 mg total) by mouth daily. (Patient not taking: Reported on 11/20/2014) 30 capsule 0  . telmisartan-hydrochlorothiazide (MICARDIS HCT) 80-25 MG per tablet Take 1 tablet by mouth daily. (Patient not taking: Reported on 11/20/2014) 30 tablet 0    Home: Home Living Family/patient expects to be discharged to:: Inpatient rehab Living Arrangements: Spouse/significant other Available Help at Discharge: Family, Available 24 hours/day Type of Home: House Home Access: Level entry Home Layout: One level Home Equipment: None Additional Comments: wife can not physically assist patient  Functional History: Prior Function Level of Independence: Independent Functional Status:  Mobility: Bed Mobility Overal bed mobility: +2 for physical assistance, Needs Assistance Bed Mobility: Supine to Sit, Sit to Supine Supine to sit: Max assist, +2 for physical assistance Sit to supine: Max assist, +2 for physical assistance General bed mobility comments: RN present for line managment, pt dependent for all mobility due to inability to follow commands and confusion. pt sat EOB x 2 min with maxA due to retropulsion Transfers Overall transfer level: Needs assistance Equipment used:  (2 person lift with gait belt and pad) Transfers: Sit to/from Stand Sit to Stand: +2 physical assistance, Max assist General transfer comment: attempted  x 2 and achieved 1/2 stand, used bed pad to clear bottom to scoot to head of bed. pt with no initiation of transfer      ADL:    Cognition: Cognition Overall Cognitive Status: Impaired/Different from baseline Orientation Level: Oriented to person, Other (comment) (UTA) Cognition Arousal/Alertness: Awake/alert Behavior During Therapy: Agitated, Restless Overall Cognitive Status: Impaired/Different from baseline Area of Impairment: Orientation, Attention, Memory, Following commands, Safety/judgement, Awareness, Problem solving Orientation Level: Disoriented to, Place, Time, Situation Current Attention Level: Focused Memory: Decreased recall of precautions, Decreased short-term memory Following Commands: Follows one step commands inconsistently Safety/Judgement: Decreased awareness of safety, Decreased awareness of deficits Awareness: Intellectual Problem Solving: Slow processing, Decreased initiation, Difficulty sequencing, Requires verbal cues, Requires tactile cues General Comments: pt tangible speech, unable to identify wife in room  Blood pressure 135/114, pulse 70, temperature 98.7 F (37.1 C), temperature source Oral, resp. rate 16, height 5\' 6"  (1.676 m), weight 95 kg (209 lb 7 oz), SpO2 96 %. Physical Exam  Constitutional: He appears well-developed.  Eyes:  Pupils round and reactive to light  Neck: Normal range of motion. Neck supple. No thyromegaly present.  Cardiovascular:  Cardiac rate controlled  Respiratory: Effort normal and breath sounds normal. No respiratory distress.  GI: Soft. Bowel sounds are normal. He exhibits no distension.  Neurological:  Patient is a bit restless and agitated. Bilateral mittens in place. He is able to provide his name. Pleasantly confused. He needed ongoing cueing for place as well as age. Followed simple commands but easily distracted. Very distracted. Right HH. Moves all 4's fairly easily. Thought the month was March.   Skin: Skin is  warm and dry.  Psychiatric:  Restless, pleasant, cooperative generally    Results for orders placed or performed during the hospital encounter of 02/08/15 (from the past 24 hour(s))  Glucose, capillary     Status: Abnormal   Collection Time: 02/16/15 12:07 PM  Result Value Ref Range   Glucose-Capillary 125 (H) 65 - 99 mg/dL  Glucose, capillary     Status: Abnormal   Collection Time: 02/16/15  5:04 PM  Result Value Ref Range   Glucose-Capillary 108 (H) 65 - 99 mg/dL  Glucose, capillary     Status: Abnormal   Collection Time: 02/16/15  7:52 PM  Result Value Ref Range   Glucose-Capillary 104 (H) 65 - 99 mg/dL  Glucose, capillary     Status: None   Collection Time: 02/16/15 11:18 PM  Result Value Ref Range   Glucose-Capillary 94 65 - 99 mg/dL  Glucose, capillary     Status: None   Collection Time: 02/17/15  3:51 AM  Result Value Ref Range   Glucose-Capillary 95 65 - 99 mg/dL   Ct Head Wo Contrast  02/16/2015   CLINICAL DATA:  Follow-up intracranial hemorrhage.  EXAM: CT HEAD WITHOUT CONTRAST  TECHNIQUE: Contiguous axial images were obtained from the base of the skull through the vertex without intravenous contrast.  COMPARISON:  CT scan of Feb 10, 2015.  FINDINGS: Bony calvarium appears intact. Stable large left occipital hemorrhage is noted compared to prior exam with surrounding edema. Stable bilateral lateral intraventricular hemorrhage is noted without evidence of ventricular dilatation. Stable subarachnoid hemorrhage is seen over right parietal cortex. Stable left-sided subdural hematoma is noted. There remains approximately 6 mm of left-to-right midline shift. Mild diffuse cortical atrophy is noted. Mild chronic ischemic white matter disease is noted.  IMPRESSION: Stable subarachnoid hemorrhage seen overlying right parietal cortex.  Stable large left occipital intraparenchymal hemorrhage is noted with surrounding white matter edema resulting in approximately 6 mL of left to right midline  shift.  Stable left-sided subdural hematoma is noted.  Stable bilateral lateral intraventricular hemorrhage is noted without ventricular dilatation.   Electronically Signed   By: Lupita RaiderJames  Green Jr, M.D.   On: 02/16/2015 07:45   Dg Chest Port 1 View  02/16/2015   CLINICAL DATA:  Short of breath  EXAM: PORTABLE CHEST - 1 VIEW  COMPARISON:  02/14/2015  FINDINGS: Interval removal of NG tube and endotracheal to. Left central venous line is unchanged. Stable cardiac silhouette ectatic aorta. No pulmonary edema or infiltrate.  IMPRESSION: Central venous catheter appears in good position.  Extubation without increase in atelectasis.   Electronically Signed   By: Genevive BiStewart  Edmunds M.D.   On: 02/16/2015 17:39   Dg Abd Portable 1v  02/15/2015   CLINICAL DATA:  Feeding tube placement  EXAM: PORTABLE ABDOMEN - 1 VIEW  COMPARISON:  02/10/2015  FINDINGS: NG tube is been removed. No feeding tubes are visualized. Gas-filled colon, small bowel, and stomach are noted.  IMPRESSION: A feeding tube is not visualized in the abdomen.   Electronically Signed   By: Jolaine ClickArthur  Hoss M.D.   On: 02/15/2015 12:41  Assessment/Plan: Diagnosis: left occipital ICH with SAH/SDH also 1. Does the need for close, 24 hr/day medical supervision in concert with the patient's rehab needs make it unreasonable for this patient to be served in a less intensive setting? Yes 2. Co-Morbidities requiring supervision/potential complications: chf, morbid obesity 3. Due to bladder management, bowel management, safety, skin/wound care, disease management, medication administration, pain management and patient education, does the patient require 24 hr/day rehab nursing? Yes 4. Does the patient require coordinated care of a physician, rehab nurse, PT (1-2 hrs/day, 5 days/week), OT (1-2 hrs/day, 5 days/week) and SLP (1-2 hrs/day, 5 days/week) to address physical and functional deficits in the context of the above medical diagnosis(es)? Yes Addressing deficits  in the following areas: balance, endurance, locomotion, strength, transferring, bowel/bladder control, bathing, dressing, feeding, grooming, toileting, cognition, speech and psychosocial support 5. Can the patient actively participate in an intensive therapy program of at least 3 hrs of therapy per day at least 5 days per week? Yes and Potentially 6. The potential for patient to make measurable gains while on inpatient rehab is good 7. Anticipated functional outcomes upon discharge from inpatient rehab are supervision  with PT, supervision and min assist with OT, supervision with SLP. 8. Estimated rehab length of stay to reach the above functional goals is: 17-24 days 9. Does the patient have adequate social supports and living environment to accommodate these discharge functional goals? Yes and Potentially 10. Anticipated D/C setting: Home 11. Anticipated post D/C treatments: HH therapy and Outpatient therapy 12. Overall Rehab/Functional Prognosis: excellent  RECOMMENDATIONS: This patient's condition is appropriate for continued rehabilitative care in the following setting: CIR Patient has agreed to participate in recommended program. N/A Note that insurance prior authorization may be required for reimbursement for recommended care.  Comment: Rehab Admissions Coordinator to follow up.  Thanks,  Ranelle Oyster, MD, [place] [facility]     02/17/2015

## 2015-02-17 NOTE — Progress Notes (Signed)
Speech Language Pathology Treatment: Dysphagia  Patient Details Name: Austin CheLeon Costantino MRN: [id number] DOB: 02/28/1935 Today's Date: 02/17/2015 Time: [id number] SLP Time Calculation (min) (ACUTE ONLY): 17 min  Assessment / Plan / Recommendation Clinical Impression  Pt continues to be limited by his cognitive status, although is more participatory in PO trials today. Small amounts of thin liquids prompt multiple swallows and an immediate cough response. He begins to manipulate small bites of puree and has minimal amount of posterior propulsion, however ultimately his sustained attention is not sufficient enough to complete his oral phase of swallow and boluses are suctioned from his mouth. Continue to recommend NPO as well as cognitive-linguistic evaluation.   HPI Other Pertinent Information: [name] is a 80 y.o. male with history of HTN, DM, CAD, a flutter on Coumadin before until 2010, alcohol abuse admitted for headache, nausea vomiting, confusion, blurred vision. CT had showed left occipital ICH with small SAH, SDH and IVH. He was intubated 5/9-5/13.   Pertinent Vitals Pain Assessment: No/denies pain Faces Pain Scale: No hurt  SLP Plan  Continue with current plan of care    Recommendations Diet recommendations: NPO Medication Administration: Via alternative means              Oral Care Recommendations: Oral care QID Follow up Recommendations: Inpatient Rehab;24 hour supervision/assistance Plan: Continue with current plan of care    Austin Sawyer, M.A. CCC-SLP [phone]  Austin Hamaiewonsky, Karoline Fleer 02/17/2015, 4:27 PM

## 2015-02-17 NOTE — Progress Notes (Signed)
PULMONARY / CRITICAL CARE MEDICINE   Name: Austin Sawyer MRN: [id number] DOB: 08/16/1935    ADMISSION DATE:  02/08/2015 CONSULTATION DATE:  02/10/2015  REFERRING MD :  Neurology  CHIEF COMPLAINT:  ICH, headache and vomiting  INITIAL PRESENTATION: [age] year old male with PMH of HTN presenting with ICH on 5/7.  On 5/9 patient decompensated and was unable to protect his airway.  PCCM was called on consultation for intubation and TLC placement for 3% saline.  STUDIES:  5/9 repeat head CT with edema and ICH that was stable. 5/10 mri brain - 7.3 x 3.3 cm left occipital hematoma unchanged from the prior CT. There is intraventricular hemorrhage and subarachnoid hemorrhage also unchanged. There is mass-effect on the left occipital horn and 3 mm midline shift to the right.  5/11 - echo - 55% , mild AS by gradient only  SIGNIFICANT EVENTS: 5/7 ICH on CT and AMS. 5/9 Unable to protect his airway and PCCM consulted. 5/10- remains HTN 5.15/16: Pt with short bursts of SVT over the last 24h, to rates as high as 200's. Attempted PAnda placement but unsuccessful. Currently HR in 70's    SUBJECTIVE/OVERNIGHT/INTERVAL HX 02/17/15: Still no luck with panda. HR consistently in 80s. 24h of amio gtt now. Speech eval pending. However, consistently agitated RASS +2 to +3. Slurred confused speech +. Hypernatremia 158 continues - half normal at 30cc/h  VITAL SIGNS: Temp:  [98.4 F (36.9 C)-98.7 F (37.1 C)] 98.7 F (37.1 C) (05/16 0400) Pulse Rate:  [60-180] 80 (05/16 0800) Resp:  [11-31] 14 (05/16 0800) BP: (111-190)/(71-126) 138/94 mmHg (05/16 0800) SpO2:  [60 %-100 %] 100 % (05/16 0800) Weight:  [95 kg (209 lb 7 oz)] 95 kg (209 lb 7 oz) (05/16 0500) HEMODYNAMICS:   VENTILATOR SETTINGS:   INTAKE / OUTPUT:  Intake/Output Summary (Last 24 hours) at 02/17/15 1001 Last data filed at 02/17/15 0800  Gross per 24 hour  Intake 1875.58 ml  Output      0 ml  Net 1875.58 ml    PHYSICAL  EXAMINATION: General:  Ill appearing, deconditioned Neuro: RASS +2 to +3. slurrted speech. Oreitned x 1. Confused HEENT: poor hearing, espec on L, Op clear, poor dentition Cardiovascular:  RRR, no M Lungs:  Clear  Abdomen:  Soft, NT, ND and +BS. Musculoskeletal: no edema Skin:  Intact.  LABS:  PULMONARY  Recent Labs Lab 02/10/15 1229 02/11/15 0330  PHART 7.413 7.398  PCO2ART 39.0 39.5  PO2ART 334.0* 126*  HCO3 24.8* 23.4  TCO2 26 24.6  O2SAT 100.0 97.9    CBC  Recent Labs Lab 02/12/15 0420 02/13/15 0320 02/16/15 0553  HGB 12.3* 11.8* 13.7  HCT 38.2* 37.1* 42.5  WBC 15.6* 13.0* 22.2*  PLT 153 158 205    COAGULATION No results for input(s): INR in the last 168 hours.  CARDIAC   Recent Labs Lab 02/11/15 2345 02/12/15 0420 02/12/15 1000  TROPONINI 0.10* 0.06* <0.03   No results for input(s): PROBNP in the last 168 hours.   CHEMISTRY  Recent Labs Lab 02/11/15 0438  02/12/15 0420  02/13/15 0320  02/14/15 0245  02/14/15 2019 02/15/15 0150 02/15/15 0820 02/15/15 1125 02/16/15 0553  NA 141  < > 152*  < > 159*  159*  < > 157*  < > 157* 155* 154* 152* 158*  K 3.6  --  3.9  --  3.4*  --  3.4*  --   --   --   --  3.3* 3.3*  CL 109  --  118*  --  122*  --  117*  --   --   --   --  112* 115*  CO2 24  --  26  --  27  --  28  --   --   --   --  27 29  GLUCOSE 141*  --  135*  --  118*  --  139*  --   --   --   --  120* 124*  BUN 20  --  26*  --  31*  --  31*  --   --   --   --  24* 32*  CREATININE 1.09  --  1.05  --  1.13  --  1.13  --   --   --   --  1.24 1.34*  CALCIUM 7.6*  --  7.6*  --  8.0*  --  8.4*  --   --   --   --  8.5* 8.4*  MG 1.8  --   --   --   --   --   --   --   --   --   --   --   --   PHOS 2.5  --   --   --   --   --   --   --   --   --   --   --   --   < > = values in this interval not displayed. Estimated Creatinine Clearance: 49 mL/min (by C-G formula based on Cr of 1.34).   LIVER  Recent Labs Lab 02/12/15 0420 02/13/15 0320   AST 25 24  ALT 27 31  ALKPHOS 44 44  BILITOT 0.5 0.5  PROT 5.8* 6.0*  ALBUMIN 2.4* 2.3*     INFECTIOUS No results for input(s): LATICACIDVEN, PROCALCITON in the last 168 hours.   ENDOCRINE CBG (last 3)   Recent Labs  02/16/15 2318 02/17/15 0351 02/17/15 0758  GLUCAP 94 95 94         IMAGING x48h  - image(s) personally visualized  -   highlighted in bold Ct Head Wo Contrast  02/16/2015   CLINICAL DATA:  Follow-up intracranial hemorrhage.  EXAM: CT HEAD WITHOUT CONTRAST  TECHNIQUE: Contiguous axial images were obtained from the base of the skull through the vertex without intravenous contrast.  COMPARISON:  CT scan of Feb 10, 2015.  FINDINGS: Bony calvarium appears intact. Stable large left occipital hemorrhage is noted compared to prior exam with surrounding edema. Stable bilateral lateral intraventricular hemorrhage is noted without evidence of ventricular dilatation. Stable subarachnoid hemorrhage is seen over right parietal cortex. Stable left-sided subdural hematoma is noted. There remains approximately 6 mm of left-to-right midline shift. Mild diffuse cortical atrophy is noted. Mild chronic ischemic white matter disease is noted.  IMPRESSION: Stable subarachnoid hemorrhage seen overlying right parietal cortex.  Stable large left occipital intraparenchymal hemorrhage is noted with surrounding white matter edema resulting in approximately 6 mL of left to right midline shift.  Stable left-sided subdural hematoma is noted.  Stable bilateral lateral intraventricular hemorrhage is noted without ventricular dilatation.   Electronically Signed   By: Lupita RaiderJames  Green Jr, M.D.   On: 02/16/2015 07:45   Dg Chest Port 1 View  02/16/2015   CLINICAL DATA:  Short of breath  EXAM: PORTABLE CHEST - 1 VIEW  COMPARISON:  02/14/2015  FINDINGS: Interval removal of NG tube and endotracheal to. Left central venous line is unchanged. Stable cardiac silhouette  ectatic aorta. No pulmonary edema or  infiltrate.  IMPRESSION: Central venous catheter appears in good position.  Extubation without increase in atelectasis.   Electronically Signed   By: Genevive Bi M.D.   On: 02/16/2015 17:39   Dg Abd Portable 1v  02/15/2015   CLINICAL DATA:  Feeding tube placement  EXAM: PORTABLE ABDOMEN - 1 VIEW  COMPARISON:  02/10/2015  FINDINGS: NG tube is been removed. No feeding tubes are visualized. Gas-filled colon, small bowel, and stomach are noted.  IMPRESSION: A feeding tube is not visualized in the abdomen.   Electronically Signed   By: Jolaine Click M.D.   On: 02/15/2015 12:41        ASSESSMENT / PLAN:  PULMONARY OETT 5/9>>> 5/13 A: VDRF due to inability to protect airway, AMS, resolved. ATX, improved    - oon 02/17/2015: Acute agitation puts him at risk for reintubation  P:   Push pulm hygiene Monitor for intubation risk closely  CARDIOVASCULAR CVL L IJ TLC 5/9>>> A: HTN in setting ICH SVT Hx A Flutter   - 51/6/16  24h of amio gtt complete. HR 80s consisttently  P:  DC amigo gtt DC hydralazine due to reflex tachycardia potentia Continue IV lopressor scheduled   Would consider cardiology consultation if SVT refractory to meds   RENAL A:   #. Hyponatremia on presentation. Hypernatremia (intentional)  - 3% saline stopped 02/13/15 but Na 158 on 02/16/15 #Hypokalemia  P:   Recheck  BMP Mag and phos 02/17/2015    GASTROINTESTINAL A:  Nutrition needs   - 5/16/16L: appears with agitation he will fail swallow  P:   PPI Swallow eval and then hopefully Po diet but might need agitation to improve   HEMATOLOGIC   A:   #leukocytosis 0- worse 02/16/15  P:  Follow CBC limit phlebotomy when able SCDs  INFECTIOUS A: r/o LLL PNA, from aspiration pre ETT P:   5/11 sputum>>>few candida (not a pathogen) unasyn 5/11>>> . Complete course unasyn Check PCT 02/17/2015 to help titrate abx duration   ENDOCRINE A:  No diabetes by history.   P:   Monitor glu with  BMET  NEUROLOGIC #Baseline  - HOH  - Dementia NOS #Current A:  ICH, unable to protect his airway. Improved per reports but acute on chronic encephalopathy persists. AT risk for reintubation  P:   Start precedex (RN to hold lopressor is patient has precedex related bradycardia) RASS goal: 0 Will need rehab   FAMILY  - Updates: Dr Delton Coombes updated family at bedside 5/14 and 5/15. None at bedside 02/17/15     The patient is critically ill with multiple organ systems failure and requires high complexity decision making for assessment and support, frequent evaluation and titration of therapies, application of advanced monitoring technologies and extensive interpretation of multiple databases.   Critical Care Time devoted to patient care services described in this note is  35  Minutes. This time reflects time of care of this signee Dr Kalman Shan. This critical care time does not reflect procedure time, or teaching time or supervisory time of PA/NP/Med student/Med Resident etc but could involve care discussion time    Dr. Kalman Shan, M.D., [facility].C.P Pulmonary and Critical Care Medicine Staff Physician  System [facility] Pager: [phone], If no answer or between  15:00h - 7:00h: call [phone]  02/17/2015 10:16 AM

## 2015-02-17 NOTE — Progress Notes (Addendum)
I contacted pt's wife by phone to discuss pt's rehab venue options. She states pt was forgetful pta, but drove and took care of himself. 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  l met with pt's daughter upon her arrival to visit. She is requesting SNF rehab and that pt is a veteran so she wants SNF with his VA benefits. I have alerted SW. We will sign off.  [phone]

## 2015-02-18 DIAGNOSIS — E870 Hyperosmolality and hypernatremia: Secondary | ICD-10-CM

## 2015-02-18 LAB — CBC WITH DIFFERENTIAL/PLATELET
Basophils Absolute: 0 10*3/uL (ref 0.0–0.1)
Basophils Relative: 0 % (ref 0–1)
EOS PCT: 1 % (ref 0–5)
Eosinophils Absolute: 0.2 10*3/uL (ref 0.0–0.7)
HCT: 38.1 % — ABNORMAL LOW (ref 39.0–52.0)
Hemoglobin: 11.7 g/dL — ABNORMAL LOW (ref 13.0–17.0)
LYMPHS ABS: 1.8 10*3/uL (ref 0.7–4.0)
LYMPHS PCT: 13 % (ref 12–46)
MCH: 28.2 pg (ref 26.0–34.0)
MCHC: 30.7 g/dL (ref 30.0–36.0)
MCV: 91.8 fL (ref 78.0–100.0)
Monocytes Absolute: 0.7 10*3/uL (ref 0.1–1.0)
Monocytes Relative: 6 % (ref 3–12)
NEUTROS PCT: 80 % — AB (ref 43–77)
Neutro Abs: 10.7 10*3/uL — ABNORMAL HIGH (ref 1.7–7.7)
PLATELETS: 175 10*3/uL (ref 150–400)
RBC: 4.15 MIL/uL — ABNORMAL LOW (ref 4.22–5.81)
RDW: 15.4 % (ref 11.5–15.5)
WBC: 13.4 10*3/uL — ABNORMAL HIGH (ref 4.0–10.5)

## 2015-02-18 LAB — GLUCOSE, CAPILLARY
Glucose-Capillary: 116 mg/dL — ABNORMAL HIGH (ref 65–99)
Glucose-Capillary: 82 mg/dL (ref 65–99)
Glucose-Capillary: 83 mg/dL (ref 65–99)
Glucose-Capillary: 86 mg/dL (ref 65–99)
Glucose-Capillary: 89 mg/dL (ref 65–99)
Glucose-Capillary: 91 mg/dL (ref 65–99)

## 2015-02-18 LAB — BASIC METABOLIC PANEL
ANION GAP: 10 (ref 5–15)
BUN: 35 mg/dL — ABNORMAL HIGH (ref 6–20)
CALCIUM: 8.2 mg/dL — AB (ref 8.9–10.3)
CO2: 28 mmol/L (ref 22–32)
CREATININE: 1.4 mg/dL — AB (ref 0.61–1.24)
Chloride: 124 mmol/L — ABNORMAL HIGH (ref 101–111)
GFR, EST AFRICAN AMERICAN: 54 mL/min — AB (ref >60)
GFR, EST NON AFRICAN AMERICAN: 47 mL/min — AB (ref >60)
Glucose, Bld: 100 mg/dL — ABNORMAL HIGH (ref 65–99)
Potassium: 3.8 mmol/L (ref 3.5–5.1)
Sodium: 162 mmol/L (ref 135–145)

## 2015-02-18 LAB — MAGNESIUM: Magnesium: 2.8 mg/dL — ABNORMAL HIGH (ref 1.7–2.4)

## 2015-02-18 LAB — PROCALCITONIN: Procalcitonin: <0.1 ng/mL

## 2015-02-18 LAB — PHOSPHORUS: PHOSPHORUS: 4.9 mg/dL — AB (ref 2.5–4.6)

## 2015-02-18 MED ORDER — PANTOPRAZOLE SODIUM 40 MG PO TBEC
40.0000 mg | DELAYED_RELEASE_TABLET | Freq: Every day | ORAL | Status: DC
  Administered 2015-02-18 – 2015-02-21 (×4): 40 mg via ORAL
  Filled 2015-02-18 (×4): qty 1

## 2015-02-18 MED ORDER — INSULIN ASPART 100 UNIT/ML ~~LOC~~ SOLN
0.0000 [IU] | Freq: Three times a day (TID) | SUBCUTANEOUS | Status: DC
  Administered 2015-02-19: 2 [IU] via SUBCUTANEOUS

## 2015-02-18 MED ORDER — DEXTROSE 5 % IV SOLN
INTRAVENOUS | Status: DC
  Administered 2015-02-18 (×2): via INTRAVENOUS

## 2015-02-18 NOTE — Progress Notes (Signed)
STROKE TEAM PROGRESS NOTE   SUBJECTIVE (INTERVAL HISTORY) RN and daughter and on are at the bedside. More awake and alert, able to carry some conversation and follow some commands. Sodium still high at 162. Will change 1/2 NS to D5. Speech eval pending today. Still on precedex.    OBJECTIVE Temp:  [97.9 F (36.6 C)-99.1 F (37.3 C)] 98 F (36.7 C) (05/17 0800) Pulse Rate:  [52-82] 55 (05/17 0800) Cardiac Rhythm:  [-] Sinus bradycardia (05/17 0600) Resp:  [15-35] 22 (05/17 0800) BP: (117-186)/(56-146) 138/69 mmHg (05/17 0800) SpO2:  [84 %-100 %] 100 % (05/17 0800) Weight:  [204 lb 12.9 oz (92.9 kg)] 204 lb 12.9 oz (92.9 kg) (05/17 0429)   Recent Labs Lab 02/17/15 1312 02/17/15 1614 02/17/15 2018 02/18/15 0011 02/18/15 0420  GLUCAP 90 97 87 89 82    Recent Labs Lab 02/14/15 0245  02/15/15 0820 02/15/15 1125 02/16/15 0553 02/17/15 1013 02/18/15 0427  NA 157*  < > 154* 152* 158* 162* 162*  K 3.4*  --   --  3.3* 3.3* 3.3* 3.8  CL 117*  --   --  112* 115* 122* 124*  CO2 28  --   --  27 29 27 28   GLUCOSE 139*  --   --  120* 124* 97 100*  BUN 31*  --   --  24* 32* 35* 35*  CREATININE 1.13  --   --  1.24 1.34* 1.52* 1.40*  CALCIUM 8.4*  --   --  8.5* 8.4* 8.3* 8.2*  MG  --   --   --   --   --  2.7* 2.8*  PHOS  --   --   --   --   --   --  4.9*  < > = values in this interval not displayed.  Recent Labs Lab 02/12/15 0420 02/13/15 0320  AST 25 24  ALT 27 31  ALKPHOS 44 44  BILITOT 0.5 0.5  PROT 5.8* 6.0*  ALBUMIN 2.4* 2.3*    Recent Labs Lab 02/12/15 0420 02/13/15 0320 02/16/15 0553 02/18/15 0427  WBC 15.6* 13.0* 22.2* 13.4*  NEUTROABS 12.8* 10.2*  --  10.7*  HGB 12.3* 11.8* 13.7 11.7*  HCT 38.2* 37.1* 42.5 38.1*  MCV 88.6 89.8 89.5 91.8  PLT 153 158 205 175    Recent Labs Lab 02/11/15 2345 02/12/15 0420 02/12/15 1000  TROPONINI 0.10* 0.06* <0.03   No results for input(s): LABPROT, INR in the last 72 hours. No results for input(s): COLORURINE,  LABSPEC, PHURINE, GLUCOSEU, HGBUR, BILIRUBINUR, KETONESUR, PROTEINUR, UROBILINOGEN, NITRITE, LEUKOCYTESUR in the last 72 hours.  Invalid input(s): APPERANCEUR     Component Value Date/Time   CHOL 178 02/09/2015 0745   TRIG 121 02/12/2015 1141   HDL 62 02/09/2015 0745   CHOLHDL 2.9 02/09/2015 0745   VLDL 9 02/09/2015 0745   LDLCALC 107* 02/09/2015 0745   Lab Results  Component Value Date   HGBA1C 6.0* 02/09/2015      Component Value Date/Time   LABOPIA NONE DETECTED 02/08/2015 1823   COCAINSCRNUR NONE DETECTED 02/08/2015 1823   LABBENZ NONE DETECTED 02/08/2015 1823   AMPHETMU NONE DETECTED 02/08/2015 1823   THCU NONE DETECTED 02/08/2015 1823   LABBARB NONE DETECTED 02/08/2015 1823    No results for input(s): ETH in the last 168 hours.  I have personally reviewed the radiological images below and agree with the radiology interpretations.  Ct Head Wo Contrast 02/16/2015 - Stable subarachnoid hemorrhage seen overlying right parietal  cortex. Stable large left occipital intraparenchymal hemorrhage is noted. with surrounding white matter edema resulting in approximately 6 mL. of left to right midline shift. Stable left-sided subdural hematoma is noted. Stable bilateral lateral intraventricular hemorrhage is noted without ventricular dilatation.  02/10/2015 - 1. No significant interval change in size of left occipital lobe intraparenchymal hematoma with slightly increased associated vasogenic edema as compared to previous. Similar trace left-to-right shift. 2. Stable to slightly decreased conspicuity of bilateral small volume subarachnoid hemorrhage. 3. Stable intraventricular hemorrhage. No hydrocephalus. 4. Persistent left subdural hematoma measuring up to 3.5 mm in maximal diameter. Blood along the left tentorium is decreased in conspicuity. 5. No new intracranial abnormality.  02/09/2015   IMPRESSION: 1. Slight enlargement of large left occipital lobe intra-axial hemorrhage.  Estimated hemorrhage volume now 58 mm. Stable left posterior hemisphere edema and mass effect. 2. Mild progression of subarachnoid extension, now bilateral. Extension and of a left subdural space with small broad-based left subdural hematoma not significantly changed. Stable intraventricular hemorrhage volume. No ventriculomegaly. 3. No new intracranial abnormality identified.      02/08/2015   IMPRESSION: 1. 6.7 cm left occipital parenchymal hemorrhage with mild surrounding edema. No midline shift. 2. Small amount of adjacent subarachnoid hemorrhage and small amount of intraventricular extension of blood. 3. Small subdural hematomas over the left cerebral convexity and left tentorium.   Dg Chest Port 1 View 02/13/15 1. Stable support apparatus. 2. Improved basilar atelectasis. 02/10/15 Tube and catheter positions as described without pneumothorax. No edema or consolidation. 02/09/2015    IMPRESSION: No acute cardiopulmonary disease.     Carotid Doppler  No evidence of significant stenosis in the right or left ICA. The left side was technically challenged due to line placement.   2D Echocardiogram  Left ventricle: The cavity size was normal. Wall thickness was increased in a pattern of moderate LVH. Systolic function was normal. The estimated ejection fraction was in the range of 55% to 60%. Doppler parameters are consistent with elevated ventricular end-diastolic filling pressure. - Aortic valve: Poorly seen Moderately calcified mild stenosis by gradients but may be underestimated due to poor CW angle. Valve area (VTI): 2.11 cm^2. Valve area (Vmax): 2.01 cm^2. Valve area (Vmean): 1.88 cm^2. - Mitral valve: There was mild regurgitation. - Left atrium: The atrium was moderately dilated. - Impressions: Cannot r/o SOE due to extremely poor image quality. Impressions: - Cannot r/o SOE due to extremely poor image quality.  MRI and MRA  7.3 x 3.3 cm left occipital hematoma unchanged from  the prior CT. There is intraventricular hemorrhage and subarachnoid hemorrhage also unchanged. There is mass-effect on the left occipital horn and 3 mm midline shift to the right. No evidence of underlying tumor or vascular malformation. This may be related to cerebral amyloid angiography. Hypertension also possible. Negative MRA circle Willis.  EEG This EEG is abnormal with moderately severe generalized continuous nonspecific slowing of cerebral activity. No evidence of epileptic activity was recorded. The absence of epileptiform activity during an EEG recording does not, in and of itself, rule out seizure disorder, however.  EKG  NSR  PHYSICAL EXAM  Temp:  [97.9 F (36.6 C)-99.1 F (37.3 C)] 98 F (36.7 C) (05/17 0800) Pulse Rate:  [52-82] 55 (05/17 0800) Resp:  [15-35] 22 (05/17 0800) BP: (117-186)/(56-146) 138/69 mmHg (05/17 0800) SpO2:  [84 %-100 %] 100 % (05/17 0800) Weight:  [204 lb 12.9 oz (92.9 kg)] 204 lb 12.9 oz (92.9 kg) (05/17 0429)  General - Well nourished, well  developed, no acute distress.  Ophthalmologic - fundi not visualized due to incorporation.  Cardiovascular - Regular rate and rhythm.  Mental Status -  Level of arousal and orientation to place were intact and person, but not orientated to time or situation. Language including expression, repetition are intact, able to follow limited simple commands, but not able to name and not able to follow other simple commands. Moderate perseveration. Right side neglect.  Cranial Nerves II - XII - II - not blinking to visual threat on the right. III, IV, VI - left eye gaze preference but able to cross midline. V - Facial sensation intact bilaterally. VII - Facial movement intact bilaterally. VIII - Hearing & vestibular intact bilaterally. X - Palate elevates symmetrically. XI - Chin turning & shoulder shrug intact bilaterally. XII - Tongue protrusion intact.  Motor Strength - The patient's strength was normal in  all extremities and pronator drift was absent.  Bulk was normal and fasciculations were absent.   Motor Tone - Muscle tone was assessed at the neck and appendages and was normal.  Reflexes - The patient's reflexes were 1+ in all extremities and he had no pathological reflexes.  Sensory - Light touch, temperature/pinprick were assessed and were symmetrical.    Coordination - not cooperative on exam.  Tremor was absent.  Gait and Station - not tested.   ASSESSMENT/PLAN [name] is a 80 y.o. male with history of HTN, DM, CAD, a flutter on Coumadin before until 2010, alcohol abuse admitted for headache, nausea vomiting, confusion, blurred vision. CT had showed left occipital ICH with small SAH, SDH and IVH. Symptoms and changed and neuro stable. Clinically stable and much improved.  Left occipital ICH with small SAH, SDH and IVH - etiology unclear, HTN versus CAA   MRI  Stable ICH, SDH and IVH - no new bleeding and minimal midline shift   MRA  unremarkable   Repeat CT showed stable hematoma with slight extension of SAH and SDH, stable IVH  Carotid Doppler  unremarkable  2D Echo  EF 55-60%  EEG no seizure  LDL 107, not at goal  HgbA1c 6.0  heparin subq for VTE prophylaxis  Diet NPO time specified   no antithrombotic prior to admission, now on no antithrombotic due to ICH  Ongoing aggressive stroke risk factor management  Therapy recommendations:  Pending  Disposition:  Pending  Hyponatremia/hypernatremia  Concerning for SIADH vs. salt wasting initially  Sodium from 143-> 131 -> 138 -> 145->154->159->156 ->152->162  Off 3% saline  Na goal normalization now  Will put on D5 today but wait for speech eval to see if free water is possible from po access  Diabetes  HbA1c 6.0, goal < 7.0  Controlled  CBG monitoring  SSI  Hypertension  Home meds:  Coreg, losartan, BP goal < 160 Currently on metoprolol iv due to no po access However, metoprolol on hold  due to low HR  under controlled  Close monitoring  Hyperlipidemia  Home meds:   Vytorin  LDL 107, goal < 70  Statin once po access  Continue statin at discharge  A. flutter  Patient history of a flutter  Was on Coumadin in the past  no anticoagulation or antiplatelet PTA  No a flutter on telemetry  Patient may not be a candidate for anticoagulation  Other Stroke Risk Factors  Advanced age  ETOH use  Obesity, Body mass index is 33.07 kg/(m^2).   Other Active Problems  Mild hypokalemia  Leukocytosis  Other Pertinent History    Hospital day # 10  This patient is critically ill due to respiratory failure, large left occipital ICH with SAH and IVH, hyponatremia and at significant risk of neurological worsening, death form respiratory failure, rebleeding, cerebral edema, brain herniation, and status epilepticus. This patient's care requires constant monitoring of vital signs, hemodynamics, respiratory and cardiac monitoring, review of multiple databases, neurological assessment, discussion with family, other specialists and medical decision making of high complexity. I spent 35 minutes of neurocritical care time in the care of this patient.  Marvel Plan, MD PhD Stroke Neurology 02/18/2015 9:06 AM  To contact Stroke Continuity provider, please refer to [email or web address]. After hours, contact General Neurology

## 2015-02-18 NOTE — Progress Notes (Addendum)
PULMONARY / CRITICAL CARE MEDICINE   Name: Austin Sawyer MRN: [id number] DOB: 06/20/1935    ADMISSION DATE:  02/08/2015 CONSULTATION DATE:  02/10/2015  REFERRING MD :  Neurology  CHIEF COMPLAINT:  ICH, headache and vomiting  INITIAL PRESENTATION: 80 year old male with PMH of HTN presenting with ICH on 5/7.  On 5/9 patient decompensated and was unable to protect his airway.  PCCM was called on consultation for intubation and TLC placement for 3% saline.  STUDIES:  5/9 repeat head CT with edema and ICH that was stable. 5/10 mri brain - 7.3 x 3.3 cm left occipital hematoma unchanged from the prior CT. There is intraventricular hemorrhage and subarachnoid hemorrhage also unchanged. There is mass-effect on the left occipital horn and 3 mm midline shift to the right.  5/11 - echo - 55% , mild AS by gradient only  SIGNIFICANT EVENTS: 5/7 ICH on CT and AMS. 5/9 Unable to protect his airway and PCCM consulted. 5/10- remains HTN 5.15/16: Pt with short bursts of SVT over the last 24h, to rates as high as 200's. Attempted PAnda placement but unsuccessful. Currently HR in 70's 02/17/15: Still no luck with panda. HR consistently in 80s and sinus. Amio stopped and hdyralazine (having sVTs)  and precedex added for agitation. Na high and unasyn was stopped   SUBJECTIVE/OVERNIGHT/INTERVAL HX 02/18/2015: Precedex really helped with agitation.  Precedex now off x 1/2h -> continues to be calm and cooperative. Na high 12 despite unasyn dc yesterday: D5w being started by neuro . PT notes recommending CIR. Repeat speech eval pending   Hx from chart review and RN at bedside  VITAL SIGNS: Temp:  [97.9 F (36.6 C)-99.1 F (37.3 C)] 98 F (36.7 C) (05/17 0800) Pulse Rate:  [52-82] 58 (05/17 0900) Resp:  [15-35] 23 (05/17 0900) BP: (114-186)/(56-126) 114/91 mmHg (05/17 0900) SpO2:  [84 %-100 %] 99 % (05/17 0900) Weight:  [92.9 kg (204 lb 12.9 oz)] 92.9 kg (204 lb 12.9 oz) (05/17 0429) HEMODYNAMICS:    VENTILATOR SETTINGS:   INTAKE / OUTPUT:  Intake/Output Summary (Last 24 hours) at 02/18/15 0919 Last data filed at 02/18/15 0900  Gross per 24 hour  Intake 1825.99 ml  Output    750 ml  Net 1075.99 ml    PHYSICAL EXAMINATION: General:  Ill appearing, deconditioned but looking better Neuro: RASS 0 - of precedex x 30 minutes. slurrted speech. Oreitned x 1. Confused HEENT: poor hearing, espec on L, Op clear, poor dentition Cardiovascular:  RRR, no M Lungs:  Clear  Abdomen:  Soft, NT, ND and +BS. Musculoskeletal: no edema Skin:  Intact.  LABS:  PULMONARY No results for input(s): PHART, PCO2ART, PO2ART, HCO3, TCO2, O2SAT in the last 168 hours.  Invalid input(s): PCO2, PO2  CBC  Recent Labs Lab 02/13/15 0320 02/16/15 0553 02/18/15 0427  HGB 11.8* 13.7 11.7*  HCT 37.1* 42.5 38.1*  WBC 13.0* 22.2* 13.4*  PLT 158 205 175    COAGULATION No results for input(s): INR in the last 168 hours.  CARDIAC    Recent Labs Lab 02/11/15 2345 02/12/15 0420 02/12/15 1000  TROPONINI 0.10* 0.06* <0.03   No results for input(s): PROBNP in the last 168 hours.   CHEMISTRY  Recent Labs Lab 02/14/15 0245  02/15/15 0820 02/15/15 1125 02/16/15 0553 02/17/15 1013 02/18/15 0427  NA 157*  < > 154* 152* 158* 162* 162*  K 3.4*  --   --  3.3* 3.3* 3.3* 3.8  CL 117*  --   --  112* 115* 122* 124*  CO2 28  --   --  GLUCOSE 139*  --   --  120* 124* 97 100*  BUN 31*  --   --  24* 32* 35* 35*  CREATININE 1.13  --   --  1.24 1.34* 1.52* 1.40*  CALCIUM 8.4*  --   --  8.5* 8.4* 8.3* 8.2*  MG  --   --   --   --   --  2.7* 2.8*  PHOS  --   --   --   --   --   --  4.9*  < > = values in this interval not displayed. Estimated Creatinine Clearance: 46.4 mL/min (by C-G formula based on Cr of 1.4).   LIVER  Recent Labs Lab 02/12/15 0420 02/13/15 0320  AST 25 24  ALT 27 31  ALKPHOS 44 44  BILITOT 0.5 0.5  PROT 5.8* 6.0*  ALBUMIN 2.4* 2.3*     INFECTIOUS  Recent  Labs Lab 02/17/15 1013  PROCALCITON 0.11     ENDOCRINE CBG (last 3)   Recent Labs  02/18/15 0011 02/18/15 0420 02/18/15 0846  GLUCAP 89 82 83         IMAGING x48h  -Dg Chest Port 1 View  02/16/2015   CLINICAL DATA:  Short of breath  EXAM: PORTABLE CHEST - 1 VIEW  COMPARISON:  02/14/2015  FINDINGS: Interval removal of NG tube and endotracheal to. Left central venous line is unchanged. Stable cardiac silhouette ectatic aorta. No pulmonary edema or infiltrate.  IMPRESSION: Central venous catheter appears in good position.  Extubation without increase in atelectasis.   Electronically Signed   By: Genevive Bi M.D.   On: 02/16/2015 17:39        ASSESSMENT / PLAN:  PULMONARY OETT 5/9>>> 5/13 A: VDRF due to inability to protect airway, AMS, resolved. ATX, improved    - oon 02/18/2015: reamins extubated post extubation. Acute agitation has improved and risk for reintubation is lower but not out of woods  P:   Push pulm hygiene Monitor for intubation risk closely  CARDIOVASCULAR CVL L IJ TLC 5/9>>> A: HTN in setting ICH SVT Hx A Flutter   - 02/17/15: 24h of amio gtt stopped. Hydralazine stopped due to risk for reflex tachycardia.  - 02/18/15: doing well HR 60 on scheduled lopressor + precedex (lopressor held due to prcedex)  P:  REstart\ IV lopressor scheduled when doing well off precedex  Would consider cardiology consultation if SVT refractory to meds or recurs   RENAL A:   #. Hyponatremia on presentation. Hypernatremia (intentional)  - 3% saline stopped 02/13/15 and Unasyn stopped 02/17/15  -Na still high 162  P:    start d5w    GASTROINTESTINAL A:  Nutrition needs   -02/18/15: awaiting repeat speech eval now that agitation better  P:   PPI Swallow eval and then hopefully Po diet  Might need IR guided panda   HEMATOLOGIC  Recent Labs Lab 02/13/15 0320 02/16/15 0553 02/18/15 0427  HGB 11.8* 13.7 11.7*  HCT 37.1* 42.5 38.1*  WBC 13.0*  22.2* 13.4*  PLT 158 205 175     A:   #leukocytosis 0- worse 02/16/15, improved 02/18/15 #Mild anemia of critical illness - unchanged  P:  Follow CBC limit phlebotomy when able - PRBC for hgb  < 7gm% SCDs  INFECTIOUS   A: r/o LLL PNA, from aspiration pre ETT - culture negative - results reviewed. PCT low  02/17/15 P:   unasyn 5/11>>> . 02/17/15 Monitor clinically   ENDOCRINE A:  No diabetes by history.   P:   Monitor glu with BMET  NEUROLOGIC #Baseline  - HOH  - Dementia NOS  #Current A:  -  ICH at admit 02/08/2015 - Acute agitation with encephalopathy 02/17/15 - responded to precedex and doing well 02/18/15 off precedex x 30minutes  P:   Dc precedex if doing well by 3pm 02/18/2015 RASS goal: 0 Will need rehab   FAMILY  - Updates: Dr Delton CoombesByrum updated family at bedside 5/14 and 5/15. None at bedside 02/17/15 and 02/18/15  [facility] d/w Dr Roda ShuttersXu neurology. PCCM will sign off 02/18/2015 if remains off precedex by end of day     Dr. Kalman ShanMurali Nykia Turko, M.D., [facility]C.P Pulmonary and Critical Care Medicine Staff Physician [facility] Pager: [phone], If no answer or between  15:00h - 7:00h: call [phone]  02/18/2015 9:19 AM

## 2015-02-18 NOTE — Progress Notes (Signed)
eLink Physician-Brief Progress Note Patient Name: Austin CheLeon Sawyer DOB: 07/09/1935 MRN: [id number]   Date of Service  02/18/2015  HPI/Events of Note  Called with multiple issues: 1. Restraints need to be reorder and 2. Patient eating a PO diet.  eICU Interventions  Will order: 1. Reorder restraints. 2. Change Novolog SSI coverage [facility] Q 4 hours [facility].     Intervention Category Intermediate Interventions: Hyperglycemia - evaluation and treatment Minor Interventions: Routine modifications to care plan (e.g. PRN medications for pain, fever)  Michayla Mcneil Eugene 02/18/2015, 8:01 PM

## 2015-02-18 NOTE — Progress Notes (Signed)
Speech Language Pathology Treatment: Dysphagia  Patient Details Name: Austin Sawyer MRN: [id number] DOB: 06/21/1935 Today's Date: 02/18/2015 Time: [id number] SLP Time Calculation (min) (ACUTE ONLY): 22 min  Assessment / Plan / Recommendation Clinical Impression  Pt shows marked improvements with oral phase of swallow, still requiring Mod cues for oral transit due to decreased sustained attention. Today he tolerated purees and thin liquids without overt signs of initiation - will initiate diet with full supervision to facilitate safe intake.   HPI Other Pertinent Information: Austin Sawyer is a 80 y.o. male with history of HTN, DM, CAD, a flutter on Coumadin before until 2010, alcohol abuse admitted for headache, nausea vomiting, confusion, blurred vision. CT had showed left occipital ICH with small SAH, SDH and IVH. He was intubated 5/9-5/13.   Pertinent Vitals Pain Assessment: No/denies pain  SLP Plan  Goals updated    Recommendations Diet recommendations: Dysphagia 1 (puree);Thin liquid Liquids provided via: Cup;Straw Medication Administration: Crushed with puree Supervision: Patient able to self feed;Full supervision/cueing for compensatory strategies Compensations: Slow rate;Small sips/bites;Other (Comment) (check for oral holding) Postural Changes and/or Swallow Maneuvers: Seated upright 90 degrees;Upright 30-60 min after meal       Oral Care Recommendations: Oral care QID Follow up Recommendations: Inpatient Rehab;24 hour supervision/assistance Plan: Goals updated    Maxcine HamLaura Paiewonsky, M.A. CCC-SLP [phone]  Maxcine Hamaiewonsky, Shaquasia Caponigro 02/18/2015, 12:21 PM

## 2015-02-18 NOTE — Clinical Social Work Placement (Signed)
   CLINICAL SOCIAL WORK PLACEMENT  NOTE  Date:  02/18/2015  Patient Details  Name: Austin Sawyer MRN: [id number] Date of Birth: 04/24/1935  Clinical Social Work is seeking post-discharge placement for this patient at the Skilled  Nursing Facility level of care (*CSW will initial, date and re-position this form in  chart as items are completed):  Yes   Patient/family provided with [facility] [facility] Clinical Social Work Department's list of facilities offering this level of care within the geographic area requested by the patient (or if unable, by the patient's family).  Yes   Patient/family informed of their freedom to choose among providers that offer the needed level of care, that participate in Medicare, Medicaid or managed care program needed by the patient, have an available bed and are willing to accept the patient.  Yes   Patient/family informed of [facility] ownership interest [facility] and [facility], as well as of the fact that they are under no obligation to receive care at these facilities.  PASRR submitted to EDS on       PASRR number received on       Existing PASRR number confirmed on       FL2 transmitted to all facilities in geographic area requested by pt/family on 02/18/15     FL2 transmitted to all facilities within larger geographic area on       Patient informed that his/her managed care company has contracts with or will negotiate with certain facilities, including the following:            Patient/family informed of bed offers received.  Patient chooses bed at       Physician recommends and patient chooses bed at      Patient to be transferred to   on  .  Patient to be transferred to facility by       Patient family notified on   of transfer.  Name of family member notified:        PHYSICIAN Please sign FL2 (Please sign 30 day note for [place] completion)     Additional Comment:    _______________________________________________ Macario GoldsJesse  Charo Philipp, LCSW [phone]

## 2015-02-18 NOTE — Clinical Social Work Note (Signed)
Clinical Social Work Assessment  Patient Details  Name: Austin Sawyer MRN: [id number] Date of Birth: 1935-04-22  Date of referral:  02/18/15               Reason for consult:  Discharge Planning, Facility Placement                Permission sought to share information with:  Family Supports Permission granted to share information::  Yes, Verbal Permission Granted  Name::     Katharine Look   Relationship::  Daughter  Contact Information:  [phone]  Housing/Transportation Living arrangements for the past 2 months:  Single Family Home Source of Information:  Patient, Spouse, Adult Children Patient Interpreter Needed:  None Criminal Activity/Legal Involvement Pertinent to Current Situation/Hospitalization:  No - Comment as needed Significant Relationships:  Adult Children, Spouse Lives with:  Spouse Do you feel safe going back to the place where you live?  Yes Need for family participation in patient care:  Yes (Comment)  Care giving concerns:  Patient son, daughter, and spouse at bedside with patient to offer additional support.  Patient spouse biggest concern is that the facility of choice is close to their current residence.  Patient family in agreement that location is the primary deciding factor for placement.  Patient family also inquired about financial responsibility - CSW spoke with them at length regarding the insurance coverage for SNF placement.   Social Worker assessment / plan:  Holiday representative met with patient and patient family at bedside to offer support and discuss patient needs at discharge.  Patient and patient family both agree that patient will require SNF placement prior to return home.  Patient was the primary caregiver for his spouse prior to hospitalization.  Patient and family agreeable to SNF search in [place] has initiated search and will follow with bed offers once available.  Patient family live on the [facility] [employer] [address] and were looking  into the possibility of [employer], [employer], [facility] but open to all offers.  Patient currently remains with a posey restraint and a Precedex drip.  CSW remains available for support and to facilitate patient discharge needs once medically stable.  Employment status:  Retired Forensic scientist:  Information systems manager, [employer] Benefit PT Recommendations:  Inpatient [place], [place] / Referral to community resources:  [place]  Patient/Family's Response to care:  Patient and family agreeable with SNF placement in [place].  Patient very hard of hearing and misunderstands quite a bit of things communicated in the room in his presence.  Patient family full understanding of the process and CSW role and remain in agreement with SNF search.  Patient/Family's Understanding of and Emotional Response to Diagnosis, Current Treatment, and Prognosis:  Patient family seem to have a limited understanding of patient potential long term deficits and need for possible long term placement due to patient spouse inability to care for patient at home.  Patient family working in the best interest of the patient.  Patient family attempting to understand treatment and diagnosis through continuous questioning.  Emotional Assessment Appearance:  Disheveled Attitude/Demeanor/Rapport:  Inconsistent, Unable to Assess (Patient very hard of hearing and it is difficult to determine if patient does not hear questions or avoiding questions) Affect (typically observed):  Accepting, Calm, Guarded Orientation:  Oriented to Self, Oriented to Place, Fluctuating Orientation (Suspected and/or reported Sundowners) Alcohol / Substance use:  Not Applicable Psych involvement (Current and /or in the community):  No (Comment)  Discharge Needs  Concerns to be addressed:  Discharge Planning Concerns Readmission within the last 30 days:  No Current discharge risk:  Physical Impairment,  Dependent with Mobility Barriers to Discharge:  Continued Medical Work up, Requiring sitter/restraints, Programmer, applications ([place])   [place], [place]

## 2015-02-18 NOTE — Progress Notes (Signed)
Pt went into SVT rate of 200s from 1349-1351 while working with PT. Pt also was shouting about the devil, and Jehovia, and getting increasingly agitated. MD notified. Orders received, will cont to monitor closely.

## 2015-02-18 NOTE — Progress Notes (Signed)
Physical Therapy Treatment Patient Details Name: Austin Sawyer MRN: [id number] DOB: 12/10/1934 Today's Date: 02/18/2015    History of Present Illness Austin CheLeon Moravek is an 80 y.o. male with a past medical history significant for HTN, DM type 2, CAD, MI, atrial flutter, alcohol abuse in remission, who [facility] with complains of severe HA, nausea, vision blurriness, confusion, and trouble finding words. CT revealed ICH of L occipital lobe.    PT Comments    Progressing slowly.  Limited by inattention or inability to keep focus on task.  Follow Up Recommendations  CIR;Supervision/Assistance - 24 hour     Equipment Recommendations  None recommended by PT    Recommendations for Other Services Rehab consult     Precautions / Restrictions Precautions Precautions: Fall Precaution Comments: became increasing agitated as the session progressed    Mobility  Bed Mobility Overal bed mobility: Needs Assistance;+2 for physical assistance Bed Mobility: Supine to Sit;Sit to Supine     Supine to sit: Mod assist;Max assist;+2 for physical assistance Sit to supine: Max assist;+2 for physical assistance   General bed mobility comments: As pt became more agitated toward end of the session, he needed more assist to scoot back on the bed and get his feet in.  Transfers Overall transfer level: Needs assistance   Transfers: Sit to/from Stand;Stand Pivot Transfers Sit to Stand: Max assist;+2 safety/equipment Stand pivot transfers: Max assist;+2 safety/equipment       General transfer comment: 2 standing trials--coming forward and working on control of the R knee/ w/shift assist to help with transfer.  Ambulation/Gait                 Stairs            Wheelchair Mobility    Modified Rankin (Stroke Patients Only) Modified Rankin (Stroke Patients Only) Pre-Morbid Rankin Score: No symptoms Modified Rankin: Severe disability     Balance Overall balance  assessment: Needs assistance   Sitting balance-Leahy Scale: Poor Sitting balance - Comments: pt able to sit for longer periods of time without stability assist     Standing balance-Leahy Scale: Zero Standing balance comment: 2 standing trials working on balance, R knee control.  Stood up to 3 minutes 1st standing trial.                    Cognition Arousal/Alertness: Awake/alert Behavior During Therapy: Restless;Agitated (as session progressed) Overall Cognitive Status: Impaired/Different from baseline Area of Impairment: Orientation;Attention;Memory;Following commands;Safety/judgement;Awareness;Problem solving Orientation Level: Disoriented to;Place;Time;Situation Current Attention Level: Focused Memory: Decreased short-term memory Following Commands: Follows one step commands with increased time Safety/Judgement: Decreased awareness of safety;Decreased awareness of deficits Awareness: Intellectual Problem Solving: Slow processing General Comments: intelligible speech    Exercises      General Comments General comments (skin integrity, edema, etc.): In the middle of the session pt had a few minute run of SVT over 200 bpm      Pertinent Vitals/Pain Pain Assessment: Faces Faces Pain Scale: No hurt    Home Living                      Prior Function            PT Goals (current goals can now be found in the care plan section) Acute Rehab PT Goals Patient Stated Goal: didn't state PT Goal Formulation: With family Time For Goal Achievement: 03/02/15 Potential to Achieve Goals: Fair Progress towards PT goals: Progressing toward goals  Frequency  Min 4X/week    PT Plan Current plan remains appropriate    Co-evaluation             End of Session Equipment Utilized During Treatment: Gait belt Activity Tolerance: Patient tolerated treatment well;Patient limited by fatigue Patient left: in bed;with call bell/phone within reach;with bed alarm  set;Other (comment)     Time: [id number] PT Time Calculation (min) (ACUTE ONLY): 42 min  Charges:  $Therapeutic Activity: 38-52 mins                    G Codes:      Kishon Garriga, [name] 02/18/2015, 2:24 PM  02/18/2015  Edison BingKen Hao Dion, PT [phone]  (pager)

## 2015-02-19 DIAGNOSIS — E785 Hyperlipidemia, unspecified: Secondary | ICD-10-CM | POA: Insufficient documentation

## 2015-02-19 DIAGNOSIS — R451 Restlessness and agitation: Secondary | ICD-10-CM | POA: Insufficient documentation

## 2015-02-19 DIAGNOSIS — I619 Nontraumatic intracerebral hemorrhage, unspecified: Secondary | ICD-10-CM | POA: Insufficient documentation

## 2015-02-19 DIAGNOSIS — G936 Cerebral edema: Secondary | ICD-10-CM

## 2015-02-19 DIAGNOSIS — I10 Essential (primary) hypertension: Secondary | ICD-10-CM | POA: Insufficient documentation

## 2015-02-19 LAB — CBC WITH DIFFERENTIAL/PLATELET
Basophils Absolute: 0 10*3/uL (ref 0.0–0.1)
Basophils Relative: 0 % (ref 0–1)
Eosinophils Absolute: 0.2 10*3/uL (ref 0.0–0.7)
Eosinophils Relative: 2 % (ref 0–5)
HEMATOCRIT: 37 % — AB (ref 39.0–52.0)
HEMOGLOBIN: 11.8 g/dL — AB (ref 13.0–17.0)
LYMPHS ABS: 1.5 10*3/uL (ref 0.7–4.0)
Lymphocytes Relative: 13 % (ref 12–46)
MCH: 28.9 pg (ref 26.0–34.0)
MCHC: 31.9 g/dL (ref 30.0–36.0)
MCV: 90.7 fL (ref 78.0–100.0)
MONOS PCT: 5 % (ref 3–12)
Monocytes Absolute: 0.6 10*3/uL (ref 0.1–1.0)
NEUTROS ABS: 9.4 10*3/uL — AB (ref 1.7–7.7)
NEUTROS PCT: 80 % — AB (ref 43–77)
Platelets: 176 10*3/uL (ref 150–400)
RBC: 4.08 MIL/uL — ABNORMAL LOW (ref 4.22–5.81)
RDW: 14.8 % (ref 11.5–15.5)
WBC: 11.7 10*3/uL — ABNORMAL HIGH (ref 4.0–10.5)

## 2015-02-19 LAB — GLUCOSE, CAPILLARY
GLUCOSE-CAPILLARY: 112 mg/dL — AB (ref 65–99)
Glucose-Capillary: 123 mg/dL — ABNORMAL HIGH (ref 65–99)
Glucose-Capillary: 87 mg/dL (ref 65–99)
Glucose-Capillary: 105 mg/dL — ABNORMAL HIGH (ref 65–99)

## 2015-02-19 LAB — BASIC METABOLIC PANEL
Anion gap: 8 (ref 5–15)
Anion gap: 9 (ref 5–15)
BUN: 26 mg/dL — AB (ref 6–20)
BUN: 22 mg/dL — ABNORMAL HIGH (ref 6–20)
CALCIUM: 7.9 mg/dL — AB (ref 8.9–10.3)
CHLORIDE: 118 mmol/L — AB (ref 101–111)
CO2: 26 mmol/L (ref 22–32)
CO2: 24 mmol/L (ref 22–32)
CREATININE: 1.31 mg/dL — AB (ref 0.61–1.24)
Calcium: 8.3 mg/dL — ABNORMAL LOW (ref 8.9–10.3)
Chloride: 115 mmol/L — ABNORMAL HIGH (ref 101–111)
Creatinine, Ser: 1.26 mg/dL — ABNORMAL HIGH (ref 0.61–1.24)
GFR calc Af Amer: 58 mL/min — ABNORMAL LOW (ref >60)
GFR calc Af Amer: >60 mL/min (ref >60)
GFR calc non Af Amer: 50 mL/min — ABNORMAL LOW (ref >60)
GFR calc non Af Amer: 53 mL/min — ABNORMAL LOW (ref >60)
GLUCOSE: 132 mg/dL — AB (ref 65–99)
GLUCOSE: 116 mg/dL — AB (ref 65–99)
Potassium: 3.4 mmol/L — ABNORMAL LOW (ref 3.5–5.1)
Potassium: 3.5 mmol/L (ref 3.5–5.1)
Sodium: 152 mmol/L — ABNORMAL HIGH (ref 135–145)
Sodium: 148 mmol/L — ABNORMAL HIGH (ref 135–145)

## 2015-02-19 LAB — PHOSPHORUS: PHOSPHORUS: 4 mg/dL (ref 2.5–4.6)

## 2015-02-19 LAB — MAGNESIUM
Magnesium: 2.8 mg/dL — ABNORMAL HIGH (ref 1.7–2.4)
Magnesium: 2.3 mg/dL (ref 1.7–2.4)

## 2015-02-19 LAB — PROCALCITONIN: Procalcitonin: <0.1 ng/mL

## 2015-02-19 MED ORDER — ADULT MULTIVITAMIN W/MINERALS CH
1.0000 | ORAL_TABLET | Freq: Every day | ORAL | Status: DC
  Administered 2015-02-19 – 2015-02-21 (×3): 1 via ORAL
  Filled 2015-02-19 (×3): qty 1

## 2015-02-19 MED ORDER — ALLOPURINOL 100 MG PO TABS
300.0000 mg | ORAL_TABLET | Freq: Every day | ORAL | Status: DC
Start: 2015-02-19 — End: 2015-02-21
  Administered 2015-02-19 – 2015-02-21 (×3): 300 mg via ORAL
  Filled 2015-02-19 (×2): qty 1
  Filled 2015-02-19: qty 3

## 2015-02-19 MED ORDER — GABAPENTIN 300 MG PO CAPS
300.0000 mg | ORAL_CAPSULE | Freq: Every day | ORAL | Status: DC
  Administered 2015-02-19 – 2015-02-21 (×3): 300 mg via ORAL
  Filled 2015-02-19 (×3): qty 1

## 2015-02-19 MED ORDER — QUETIAPINE FUMARATE 25 MG PO TABS
25.0000 mg | ORAL_TABLET | Freq: Every day | ORAL | Status: DC
  Administered 2015-02-19 – 2015-02-20 (×2): 25 mg via ORAL
  Filled 2015-02-19 (×3): qty 1

## 2015-02-19 MED ORDER — DULOXETINE HCL 20 MG PO CPEP
40.0000 mg | ORAL_CAPSULE | Freq: Every day | ORAL | Status: DC
  Administered 2015-02-19 – 2015-02-21 (×2): 40 mg via ORAL
  Filled 2015-02-19 (×3): qty 2

## 2015-02-19 MED ORDER — SODIUM CHLORIDE 0.9 % IV SOLN
INTRAVENOUS | Status: DC
  Administered 2015-02-19: 30 mL/h via INTRAVENOUS

## 2015-02-19 MED ORDER — ATORVASTATIN CALCIUM 10 MG PO TABS
10.0000 mg | ORAL_TABLET | Freq: Every day | ORAL | Status: DC
  Administered 2015-02-19 – 2015-02-20 (×2): 10 mg via ORAL
  Filled 2015-02-19 (×3): qty 1

## 2015-02-19 MED ORDER — POTASSIUM CHLORIDE 10 MEQ/100ML IV SOLN
10.0000 meq | INTRAVENOUS | Status: AC
  Administered 2015-02-19 – 2015-02-20 (×3): 10 meq via INTRAVENOUS
  Filled 2015-02-19 (×2): qty 100

## 2015-02-19 MED ORDER — HYDRALAZINE HCL 50 MG PO TABS
50.0000 mg | ORAL_TABLET | Freq: Three times a day (TID) | ORAL | Status: DC
  Administered 2015-02-19 – 2015-02-21 (×7): 50 mg via ORAL
  Filled 2015-02-19 (×10): qty 1

## 2015-02-19 MED ORDER — METOPROLOL TARTRATE 25 MG PO TABS
25.0000 mg | ORAL_TABLET | Freq: Two times a day (BID) | ORAL | Status: DC
  Administered 2015-02-19: 25 mg via ORAL
  Filled 2015-02-19 (×3): qty 1

## 2015-02-19 MED ORDER — COLCHICINE 0.6 MG PO TABS
0.6000 mg | ORAL_TABLET | Freq: Every day | ORAL | Status: DC
  Administered 2015-02-19 – 2015-02-21 (×3): 0.6 mg via ORAL
  Filled 2015-02-19 (×3): qty 1

## 2015-02-19 MED ORDER — POTASSIUM CHLORIDE CRYS ER 20 MEQ PO TBCR
40.0000 meq | EXTENDED_RELEASE_TABLET | ORAL | Status: AC
  Administered 2015-02-19 (×2): 40 meq via ORAL
  Filled 2015-02-19 (×2): qty 2

## 2015-02-19 MED ORDER — ENSURE ENLIVE PO LIQD
237.0000 mL | Freq: Two times a day (BID) | ORAL | Status: DC
  Administered 2015-02-19 – 2015-02-21 (×4): 237 mL via ORAL

## 2015-02-19 NOTE — Progress Notes (Signed)
PULMONARY / CRITICAL CARE MEDICINE   Name: Austin Sawyer MRN: [id number] DOB: 10/21/1934    ADMISSION DATE:  02/08/2015 CONSULTATION DATE:  02/10/2015  REFERRING MD :  Neurology  CHIEF COMPLAINT:  ICH, headache and vomiting  INITIAL PRESENTATION: 80 year old male with PMH of HTN presenting with ICH on 5/7.  On 5/9 patient decompensated and was unable to protect his airway.  PCCM was called on consultation for intubation and TLC placement for 3% saline.  STUDIES:  5/9 repeat head CT with edema and ICH that was stable. 5/10 mri brain - 7.3 x 3.3 cm left occipital hematoma unchanged from the prior CT. There is intraventricular hemorrhage and subarachnoid hemorrhage also unchanged. There is mass-effect on the left occipital horn and 3 mm midline shift to the right.  5/11 - echo - 55% , mild AS by gradient only  SIGNIFICANT EVENTS: 5/7 ICH on CT and AMS. 5/9 Unable to protect his airway and PCCM consulted. 5/10- remains HTN 5.15/16: Pt with short bursts of SVT over the last 24h, to rates as high as 200's. Attempted PAnda placement but unsuccessful. Currently HR in 70's 02/17/15: Still no luck with panda. HR consistently in 80s and sinus. Amio stopped and hdyralazine (having sVTs)  and precedex added for agitation. Na high and unasyn was stopped  02/18/2015: Precedex really helped with agitation.  Precedex now off x 1/2h -> continues to be calm and cooperative. Na high 12 despite unasyn dc yesterday: D5w being started by neuro . PT notes recommending CIR. Repeat speech eval pending   SUBJECTIVE/OVERNIGHT/INTERVAL HX 02/19/15: Has breief run SVT yesterday afternoon 200s while off precedex and also got agiated and confused - needed to go back on precedex which helped. This Morning Neuro MD - has attempted dc precedex again with switch to seroquel. Needed restratints overnight. Na improved 10meq to 152 in 24h and d5W stopped.    Hx from chart review and RN at bedside and neuro MD at bedside  VITAL  SIGNS: Temp:  [98 F (36.7 C)-100.2 F (37.9 C)] 98 F (36.7 C) (05/18 0740) Pulse Rate:  [49-96] 96 (05/18 0800) Resp:  [11-30] 29 (05/18 0800) BP: (99-171)/(51-106) 132/60 mmHg (05/18 0800) SpO2:  [89 %-100 %] 96 % (05/18 0800) Weight:  [95 kg (209 lb 7 oz)] 95 kg (209 lb 7 oz) (05/18 0358) HEMODYNAMICS:   VENTILATOR SETTINGS:   INTAKE / OUTPUT:  Intake/Output Summary (Last 24 hours) at 02/19/15 0925 Last data filed at 02/19/15 0700  Gross per 24 hour  Intake 2223.23 ml  Output    700 ml  Net 1523.23 ml    PHYSICAL EXAMINATION: General:  Ill appearing, deconditioned Neuro: RASS +1 - just off precedex x 30 minutes. slurrted speech. Oreitned x 1. Confused an in [place] HEENT: poor hearing, espec on L, Op clear, poor dentition Cardiovascular:  RRR, no M Lungs:  Clear  Abdomen:  Soft, NT, ND and +BS. Musculoskeletal: no edema Skin:  Intact.  LABS:  PULMONARY No results for input(s): PHART, PCO2ART, PO2ART, HCO3, TCO2, O2SAT in the last 168 hours.  Invalid input(s): PCO2, PO2  CBC  Recent Labs Lab 02/16/15 0553 02/18/15 0427 02/19/15 0505  HGB 13.7 11.7* 11.8*  HCT 42.5 38.1* 37.0*  WBC 22.2* 13.4* 11.7*  PLT 205 175 176    COAGULATION No results for input(s): INR in the last 168 hours.  CARDIAC    Recent Labs Lab 02/12/15 1000  TROPONINI <0.03   No results for input(s): PROBNP in the  last 168 hours.   CHEMISTRY  Recent Labs Lab 02/15/15 1125 02/16/15 0553 02/17/15 1013 02/18/15 0427 02/19/15 0505  NA 152* 158* 162* 162* 152*  K 3.3* 3.3* 3.3* 3.8 3.4*  CL 112* 115* 122* 124* 118*  CO2 GLUCOSE 120* 124* 97 100* 132*  BUN 24* 32* 35* 35* 26*  CREATININE 1.24 1.34* 1.52* 1.40* 1.31*  CALCIUM 8.5* 8.4* 8.3* 8.2* 7.9*  MG  --   --  2.7* 2.8* 2.8*  PHOS  --   --   --  4.9* 4.0   Estimated Creatinine Clearance: 50.2 mL/min (by C-G formula based on Cr of 1.31).   LIVER  Recent Labs Lab 02/13/15 0320  AST 24  ALT 31   ALKPHOS 44  BILITOT 0.5  PROT 6.0*  ALBUMIN 2.3*     INFECTIOUS  Recent Labs Lab 02/17/15 1013 02/18/15 0427 02/19/15 0505  PROCALCITON 0.11 <0.10 <0.10     ENDOCRINE CBG (last 3)   Recent Labs  02/18/15 1706 02/18/15 2137 02/19/15 0739  GLUCAP 86 116* 123*         IMAGING x48h  -No results found.      ASSESSMENT / PLAN:  PULMONARY OETT 5/9>>> 5/13 A: VDRF due to inability to protect airway, AMS, resolved. ATX, improved    - oon 02/19/2015: reamins extubated post extubation. Acute agitation has improved and risk for reintubation is lower but not out of woods  P:   Push pulm hygiene Monitor for intubation risk c  CARDIOVASCULAR CVL L IJ TLC 5/9>>> A: HTN in setting ICH SVT Hx A Flutter   - 02/17/15: 24h of amio gtt stopped. Hydralazine stopped due to risk for reflex tachycardia.  - 02/18/15: doing well HR 60 on scheduled lopressor + precedex (lopressor held due to prcedex) - 02/19/15 - Did have SVT again yesterday. Currently HR 60s off precedex  P:  Continue lopressor scheduled  Cards consult needed - Dr Roda Shutters will call  RENAL A:   #. Hyponatremia on presentation. Hypernatremia (intentional)  - 3% saline stopped 02/13/15 and Unasyn stopped 02/17/15  -Na improved to 152 on 02/19/15 P:   Stop d5 W (done by dr Roda Shutters)    GASTROINTESTINAL A:  Nutrition needs   -02/19/15: D1 diet recommended  P:   PPI d1 diet per speech   HEMATOLOGIC  Recent Labs Lab 02/16/15 0553 02/18/15 0427 02/19/15 0505  HGB 13.7 11.7* 11.8*  HCT 42.5 38.1* 37.0*  WBC 22.2* 13.4* 11.7*  PLT 205 175 176     A:   #leukocytosis 0- worse 02/16/15, nearly reslved 02/19/15 #Mild anemia of critical illness - unchanged  P:  Follow CBC limit phlebotomy when able - PRBC for hgb  < 7gm% SCDs  INFECTIOUS   A: r/o LLL PNA, from aspiration pre ETT - culture negative - results reviewed. PCT low 02/17/15 P:   unasyn 5/11>>> . 02/17/15 Monitor  clinically   ENDOCRINE A:  No diabetes by history.   P:   Monitor glu with BMET  NEUROLOGIC #Baseline  - HOH  - Dementia NOS  #Current A:  -  ICH at admit 02/08/2015 - Acute agitation severe with encephalopathy 02/17/15 - responded  to precedex but recurred 02/18/15 off precedex. Extreme risk for recurrence 02/19/15 with precedex gtt on hold  P:   Monitor off precedex gtt but restart if recurs again - most likely will recur Agree with seroquel RASS goal: 0 Will need rehab   FAMILY  -  Updates: Dr Delton CoombesByrum updated family at bedside 5/14 and 5/15. None at bedside 02/17/15 and 02/18/15 and 02/19/15  [facility] d/w Dr Roda ShuttersXu neurology 02/19/15      Dr. Kalman ShanMurali Yailin Biederman, M.D., F.C.C.P Pulmonary and Critical Care Medicine Staff Physician [facility] Pager: [phone], If no answer or between  15:00h - 7:00h: call [phone]  02/19/2015 9:25 AM

## 2015-02-19 NOTE — Progress Notes (Signed)
Nutrition Follow-up  DOCUMENTATION CODES:  Obesity unspecified  INTERVENTION:  Ensure Enlive po BID, each supplement provides 350 kcal and 20 grams of protein  NUTRITION DIAGNOSIS:  Inadequate oral intake related to lethargy/confusion as evidenced by other (see comment) (meal completion < 75%).  ongoing  GOAL:  Patient will meet greater than or equal to 90% of their needs  Not met.   MONITOR:  PO intake, Supplement acceptance, Labs, I & O's, Weight trends  REASON FOR ASSESSMENT:  Consult Enteral/tube feeding initiation and management  ASSESSMENT:  Pt admitted as a code stroke, ICH on CT, decompensated 5/9 and intubated.  Pt extubated 5/13 staff unable to re-place feeding tube. Failed SLP eval 5/15 Pt had repeat swallow eval 5/17 and started on Dysphagia 1 diet with Thin liquids. Per RN pt ate good this am but was mostly thirsty and wanted to drink fluids.   Pt discussed during ICU rounds and with RN.  Labs reviewed:  Sodium, BUN/Cr, Magnesium elevated Potassium low, Phosphorus WNL Medications reviewed and include: novolog, MVI, miralax, KDur, senokot-S  Pt is confused and unable to answer questions. Will order ensure until PO intake is established.   Height:  Ht Readings from Last 1 Encounters:  02/08/15 '5\' 6"'  (1.676 m)    Weight:  Wt Readings from Last 1 Encounters:  02/19/15 209 lb 7 oz (95 kg)    Ideal Body Weight:  64.5 kg  Wt Readings from Last 10 Encounters:  02/19/15 209 lb 7 oz (95 kg)  03/15/14 227 lb (102.967 kg)  09/14/13 220 lb (99.791 kg)  07/12/13 217 lb (98.431 kg)  04/27/13 213 lb (96.616 kg)  04/22/13 220 lb (99.791 kg)  10/04/12 220 lb (99.791 kg)  06/23/12 219 lb (99.338 kg)  06/19/10 221 lb (100.245 kg)  12/09/09 240 lb (108.863 kg)    BMI:  Body mass index is 33.82 kg/(m^2).  Estimated Nutritional Needs:  Kcal:  1900-2000  Protein:  95-115 grams  Fluid:  > 1.9 L/day  Skin:  Reviewed, no issues  Diet Order:  DIET  - DYS 1 Room service appropriate?: Yes; Fluid consistency:: Thin  EDUCATION NEEDS:  No education needs identified at this time   Intake/Output Summary (Last 24 hours) at 02/19/15 1143 Last data filed at 02/19/15 0700  Gross per 24 hour  Intake 2223.23 ml  Output    575 ml  Net 1648.23 ml    Last BM:  5/18  [address]lon [address]ppers [address], [place], [place] Pager [phone] After Hours Pager

## 2015-02-19 NOTE — Consult Note (Signed)
CONSULT NOTE  Date: 02/19/2015               Patient Name:  Austin Sawyer MRN: [id number]  DOB: 02/24/1935 Age / Sex: 80 y.o., male        PCP: OSEI-BONSU,GEORGE Primary Cardiologist: Hochrein             Referring Physician: Roda ShuttersXu              Reason for Consult: Nonsustained SVT           History of Present Illness: Patient is a 80 y.o. male with a PMHx of HTN, DM , CAD, atrial flutter , who [facility] for evaluation of intracranial hemorrhage. Marland Kitchen.  Hes been having episodes of nonsustained atrial tachycardia.  He has been extubated. Able to talk , not sure of his understanding of the situation   Medications: Outpatient medications: Prescriptions prior to admission  Medication Sig Dispense Refill Last Dose  . allopurinol (ZYLOPRIM) 300 MG tablet Take 300 mg by mouth daily.   02/08/2015 at 0800  . carvedilol (COREG) 25 MG tablet Take 37.5 mg by mouth 2 (two) times daily with a meal.   02/08/2015 at 0800  . colchicine 0.6 MG tablet Take 1 tablet (0.6 mg total) by mouth daily. 30 tablet 0 02/08/2015 at 0800  . DULoxetine (CYMBALTA) 20 MG capsule Take 40 mg by mouth daily.   02/08/2015 at 0800  . gabapentin (NEURONTIN) 300 MG capsule Take 300 mg by mouth daily.   02/08/2015 at 0800  . losartan (COZAAR) 100 MG tablet Take 100 mg by mouth daily.   02/08/2015 at 0800  . metFORMIN (GLUCOPHAGE-XR) 500 MG 24 hr tablet Take 1 tablet (500 mg total) by mouth daily with breakfast. 30 tablet 0 02/08/2015 at 0800  . omeprazole (PRILOSEC) 20 MG capsule Take 20 mg by mouth daily.   02/08/2015 at 0800  . valsartan-hydrochlorothiazide (DIOVAN-HCT) 160-12.5 MG per tablet Take 1 tablet by mouth daily.   02/08/2015 at 0800  . darifenacin (ENABLEX) 7.5 MG 24 hr tablet Take 1 tablet (7.5 mg total) by mouth daily. (Patient not taking: Reported on 11/20/2014) 30 tablet 0 Not Taking at Unknown time  . DULoxetine (CYMBALTA) 60 MG capsule Take 1 capsule (60 mg total) by mouth daily. (Patient not taking:  Reported on 11/20/2014) 60 capsule 12 Not Taking at Unknown time  . ezetimibe-simvastatin (VYTORIN) 10-80 MG per tablet Take 1 tablet by mouth at bedtime. (Patient not taking: Reported on 11/20/2014) 30 tablet 0 Not Taking at Unknown time  . hydrOXYzine (VISTARIL) 50 MG capsule Take 1 capsule (50 mg total) by mouth at bedtime. (Patient not taking: Reported on 11/20/2014) 30 capsule 0 Not Taking at Unknown time  . mirtazapine (REMERON) 15 MG tablet Take 1 tablet (15 mg total) by mouth at bedtime as needed (insomia). (Patient not taking: Reported on 11/20/2014) 30 tablet 0 Not Taking at Unknown time  . nitroGLYCERIN (NITROSTAT) 0.4 MG SL tablet Place 0.4 mg under the tongue every 5 (five) minutes as needed. Chest pain   Not Taking at Unknown time  . solifenacin (VESICARE) 5 MG tablet Take 2 tablets (10 mg total) by mouth daily. (Patient not taking: Reported on 11/20/2014) 60 tablet 0 Not Taking at Unknown time  . tamsulosin (FLOMAX) 0.4 MG CAPS Take 1 capsule (0.4 mg total) by mouth daily. (Patient not taking: Reported on 11/20/2014) 30 capsule 0 Not Taking at Unknown time  . telmisartan-hydrochlorothiazide (MICARDIS  HCT) 80-25 MG per tablet Take 1 tablet by mouth daily. (Patient not taking: Reported on 11/20/2014) 30 tablet 0 Not Taking at Unknown time    Current medications: Current Facility-Administered Medications  Medication Dose Route Frequency Provider Last Rate Last Dose  . 0.9 %  sodium chloride infusion   Intravenous Continuous Marvel PlanJindong Xu, MD 30 mL/hr at 02/19/15 0947 30 mL/hr at 02/19/15 0947  . acetaminophen (TYLENOL) tablet 650 mg  650 mg Oral Q4H PRN Lunette Standssvaldo A Camilo, MD   650 mg at 02/18/15 2031  . albuterol (PROVENTIL) (2.5 MG/3ML) 0.083% nebulizer solution 2.5 mg  2.5 mg Nebulization BID PRN Nelda Bucksaniel J Feinstein, MD   2.5 mg at 02/13/15 2058  . antiseptic oral rinse (CPC / CETYLPYRIDINIUM CHLORIDE 0.05%) solution 7 mL  7 mL Mouth Rinse QID Alyson ReedyWesam G Yacoub, MD   7 mL at 02/19/15 1200  .  bisacodyl (DULCOLAX) suppository 10 mg  10 mg Rectal Daily PRN Nelda Bucksaniel J Feinstein, MD      . chlorhexidine (PERIDEX) 0.12 % solution 15 mL  15 mL Mouth Rinse BID Alyson ReedyWesam G Yacoub, MD   15 mL at 02/19/15 [id number]  . dexmedetomidine (PRECEDEX) 200 MCG/50ML (4 mcg/mL) infusion  0.4-1.2 mcg/kg/hr Intravenous Titrated Kalman ShanMurali Ramaswamy, MD   Stopped at 02/19/15 0855  . feeding supplement (ENSURE ENLIVE) (ENSURE ENLIVE) liquid 237 mL  237 mL Oral BID BM Arlyss GandyHeather C Pitts, RD      . heparin injection 5,000 Units  5,000 Units Subcutaneous 3 times per day Marvel PlanJindong Xu, MD   5,000 Units at 02/19/15 0551  . insulin aspart (novoLOG) injection 0-15 Units  0-15 Units Subcutaneous TID WC Karl ItoSteven E Sommer, MD   2 Units at 02/19/15 [phone]  . multivitamin with minerals tablet 1 tablet  1 tablet Oral Daily Marvel PlanJindong Xu, MD   1 tablet at 02/19/15 1008  . pantoprazole (PROTONIX) EC tablet 40 mg  40 mg Oral Daily Marvel PlanJindong Xu, MD   40 mg at 02/19/15 0932  . polyethylene glycol (MIRALAX / GLYCOLAX) packet 17 g  17 g Oral Daily Nelda Bucksaniel J Feinstein, MD   17 g at 02/14/15 1030  . potassium chloride SA (K-DUR,KLOR-CON) CR tablet 40 mEq  40 mEq Oral Q4H Marvel PlanJindong Xu, MD      . QUEtiapine (SEROQUEL) tablet 25 mg  25 mg Oral QHS Marvel PlanJindong Xu, MD      . senna-docusate (Senokot-S) tablet 1 tablet  1 tablet Oral BID Lunette Standssvaldo A Camilo, MD   1 tablet at 02/19/15 0931     No Known Allergies   Past Medical History  Diagnosis Date  . Hypertension   . Coronary artery disease   . Diabetes mellitus without complication   . MI, old   . Reflux   . Depression   . Cervicalgia   . Disturbance of skin sensation   . Coronary atherosclerosis of unspecified type of vessel, native or graft   . Unspecified hearing loss   . Unspecified sleep apnea   . Alcohol abuse, in remission   . Atrial flutter     Past Surgical History  Procedure Laterality Date  . Hernia repair    . Rotator cuff repair      Family History  Problem Relation Age of Onset  . Anxiety  disorder Mother   . Cancer Mother     Social History:  reports that he has quit smoking. He has never used smokeless tobacco. He reports that he does not drink alcohol or use illicit drugs.  Review of Systems: Constitutional:  denies fever, chills, diaphoresis, appetite change and fatigue.  HEENT: denies photophobia, eye pain, redness, hearing loss, ear pain, congestion, sore throat, rhinorrhea, sneezing, neck pain, neck stiffness and tinnitus.  Respiratory: denies SOB, DOE, cough, chest tightness, and wheezing.  Cardiovascular: denies chest pain, palpitations and leg swelling.  Gastrointestinal: denies nausea, vomiting, abdominal pain, diarrhea, constipation, blood in stool.  Genitourinary: denies dysuria, urgency, frequency, hematuria, flank pain and difficulty urinating.  Musculoskeletal: denies  myalgias, back pain, joint swelling, arthralgias and gait problem.   Skin: denies pallor, rash and wound.  Neurological: denies dizziness, seizures, syncope, weakness, light-headedness, numbness and headaches.   Hematological: denies adenopathy, easy bruising, personal or family bleeding history.  Psychiatric/ Behavioral: denies suicidal ideation, mood changes, confusion, nervousness, sleep disturbance and agitation.    Physical Exam: BP 190/83 mmHg  Pulse 73  Temp(Src) 100.2 F (37.9 C) (Axillary)  Resp 16  Ht  (1.676 m)  Wt 95 kg (209 lb 7 oz)  BMI 33.82 kg/m2  SpO2 100%  Wt Readings from Last 3 Encounters:  02/19/15 95 kg (209 lb 7 oz)  03/15/14 102.967 kg (227 lb)  09/14/13 99.791 kg (220 lb)    General: Vital signs reviewed and noted. Well-developed, well-nourished, in no acute distress; alert,   Head: Normocephalic, atraumatic, sclera anicteric,   Neck: Supple. Negative for carotid bruits. No JVD   Lungs:  Clear bilaterally, no  wheezes, rales, or rhonchi. Breathing is normal   Heart: RRR with S1 S2. No murmurs, rubs, or gallops   Abdomen/ GI :  Soft, non-tender,  non-distended with normoactive bowel sounds. No hepatomegaly. No rebound/guarding. No obvious abdominal masses   MSK: Strength and the appear normal for age.   Extremities: No clubbing or cyanosis. No edema.  Distal pedal pulses are 2+ and equal   Neurologic:  CN are grossly intact,  No obvious motor or sensory defect.  Alert and oriented X 3. Moves all extremities spontaneously.  Psych: Responds to questions appropriately with a normal affect.     Lab results: Basic Metabolic Panel:  Recent Labs Lab 02/17/15 1013 02/18/15 0427 02/19/15 0505  NA 162* 162* 152*  K 3.3* 3.8 3.4*  CL 122* 124* 118*  CO2 GLUCOSE 97 100* 132*  BUN 35* 35* 26*  CREATININE 1.52* 1.40* 1.31*  CALCIUM 8.3* 8.2* 7.9*  MG 2.7* 2.8* 2.8*  PHOS  --  4.9* 4.0    Liver Function Tests:  Recent Labs Lab 02/13/15 0320  AST 24  ALT 31  ALKPHOS 44  BILITOT 0.5  PROT 6.0*  ALBUMIN 2.3*   No results for input(s): LIPASE, AMYLASE in the last 168 hours. No results for input(s): AMMONIA in the last 168 hours.  CBC:  Recent Labs Lab 02/13/15 0320 02/16/15 0553 02/18/15 0427 02/19/15 0505  WBC 13.0* 22.2* 13.4* 11.7*  NEUTROABS 10.2*  --  10.7* 9.4*  HGB 11.8* 13.7 11.7* 11.8*  HCT 37.1* 42.5 38.1* 37.0*  MCV 89.8 89.5 91.8 90.7  PLT 158 205 175 176    Cardiac Enzymes: No results for input(s): CKTOTAL, CKMB, CKMBINDEX, TROPONINI in the last 168 hours.  BNP: Invalid input(s): POCBNP  CBG:  Recent Labs Lab 02/18/15 1234 02/18/15 1706 02/18/15 2137 02/19/15 0739 02/19/15 1205  GLUCAP 91 86 116* 123* 87    Coagulation Studies: No results for input(s): LABPROT, INR in the last 72 hours.   Other results: Personal review of EKG shows :  NSR with  frequent PACs Personal review of tele:  Shows, NSR with PACs, occasional brief runs of atrial tach   Imaging:  No results found.    Assessment & Plan: 1. Nonsustained atrial tach:   These are asymptomatic and are not  hemodynamically significant and are benign.  He is in NSR currently but I see HR as low as  the 49-55 range. In the previous notes, I see that he has been on metoprolol but this was stopped due to Presidex therapy.  At this point, I would not treat these self limiting , brief runs of atrial tach.  They are not hemodynamically significant .  Could try low dose metoprolol if he has more PACs and does not have any bradycardia   Will sign off. Call for questions   Alvia Grove., MD, [facility] 02/19/2015, 1:58 PM Office - [phone] Pager [phone]

## 2015-02-19 NOTE — Progress Notes (Signed)
Speech Language Pathology Treatment: Dysphagia  Patient Details Name: Austin Sawyer MRN: [id number] DOB: 05/06/1935 Today's Date: 02/19/2015 Time: [id number] SLP Time Calculation (min) (ACUTE ONLY): 14 min  Assessment / Plan / Recommendation Clinical Impression  Pt appears to be tolerating current diet without overt signs of aspiration. Advanced trials of solids resulted in prolonged mastication with Mod cues from SLP for oral clearance. Liquid wash ultimately facilitated this. Family present says that they can bring his dentures in, which may be helpful. Will continue pureed diet and thin liquids for now.   HPI Other Pertinent Information: Austin Sawyer is a 80 y.o. male with history of HTN, DM, CAD, a flutter on Coumadin before until 2010, alcohol abuse admitted for headache, nausea vomiting, confusion, blurred vision. CT had showed left occipital ICH with small SAH, SDH and IVH. He was intubated 5/9-5/13.   Pertinent Vitals Pain Assessment: No/denies pain  SLP Plan  Continue with current plan of care    Recommendations Diet recommendations: Dysphagia 1 (puree);Thin liquid Liquids provided via: Cup;Straw Medication Administration: Crushed with puree Supervision: Patient able to self feed;Full supervision/cueing for compensatory strategies Compensations: Slow rate;Small sips/bites;Other (Comment) Postural Changes and/or Swallow Maneuvers: Seated upright 90 degrees;Upright 30-60 min after meal       Oral Care Recommendations: Oral care BID Follow up Recommendations: Inpatient Rehab;24 hour supervision/assistance Plan: Continue with current plan of care    Maxcine HamLaura Paiewonsky, M.A. CCC-SLP [phone]  Maxcine Hamaiewonsky, Ritesh Opara 02/19/2015, 11:17 AM

## 2015-02-19 NOTE — Progress Notes (Signed)
eLink Physician-Brief Progress Note Patient Name: Austin CheLeon Saltos DOB: 06/19/1935 MRN: [id number]   Date of Service  02/19/2015  HPI/Events of Note  Hypertension - BP = 171/70. Already on Hydralazine 50 mg PO Q 8 hours.   eICU Interventions  Will add Metoprolol 25 mg PO now and BID. This should help with AFIB control as well.      Intervention Category Intermediate Interventions: Hypertension - evaluation and management;Arrhythmia - evaluation and management  Sommer,Steven Eugene 02/19/2015, 6:05 PM

## 2015-02-19 NOTE — Progress Notes (Signed)
eLink Physician-Brief Progress Note Patient Name: Candise CheLeon Carre DOB: 04/22/1935 MRN: [id number]   Date of Service  02/19/2015  HPI/Events of Note  K+ = 3.5, Mg++ = 2.3 and Creatinine = 1.26.  eICU Interventions  Will replete K+.     Intervention Category Intermediate Interventions: Electrolyte abnormality - evaluation and management  Sommer,Steven Eugene 02/19/2015, 9:56 PM

## 2015-02-19 NOTE — Progress Notes (Signed)
STROKE TEAM PROGRESS NOTE   SUBJECTIVE (INTERVAL HISTORY) RN is at the bedside. Some drowsiness in the morning, but on awaken he is able to carry some conversation and follow some commands. Sodium this am at 152. Stop D5 and put on NS, encourage po fluid intake as he passed swallow. Still on precedex due to agitation last night. He also had recurrent SVT, will request cardiology consult.    OBJECTIVE Temp:  [98 F (36.7 C)-100.2 F (37.9 C)] 100.2 F (37.9 C) (05/18 1206) Pulse Rate:  [49-96] 73 (05/18 1308) Cardiac Rhythm:  [-] Normal sinus rhythm (05/18 1300) Resp:  [11-30] 16 (05/18 1308) BP: (102-195)/(51-106) 190/83 mmHg (05/18 1308) SpO2:  [89 %-100 %] 100 % (05/18 1308) Weight:  [209 lb 7 oz (95 kg)] 209 lb 7 oz (95 kg) (05/18 0358)   Recent Labs Lab 02/18/15 1234 02/18/15 1706 02/18/15 2137 02/19/15 0739 02/19/15 1205  GLUCAP 91 86 116* 123* 87    Recent Labs Lab 02/15/15 1125 02/16/15 0553 02/17/15 1013 02/18/15 0427 02/19/15 0505  NA 152* 158* 162* 162* 152*  K 3.3* 3.3* 3.3* 3.8 3.4*  CL 112* 115* 122* 124* 118*  CO2 27 29 27 28 26   GLUCOSE 120* 124* 97 100* 132*  BUN 24* 32* 35* 35* 26*  CREATININE 1.24 1.34* 1.52* 1.40* 1.31*  CALCIUM 8.5* 8.4* 8.3* 8.2* 7.9*  MG  --   --  2.7* 2.8* 2.8*  PHOS  --   --   --  4.9* 4.0    Recent Labs Lab 02/13/15 0320  AST 24  ALT 31  ALKPHOS 44  BILITOT 0.5  PROT 6.0*  ALBUMIN 2.3*    Recent Labs Lab 02/13/15 0320 02/16/15 0553 02/18/15 0427 02/19/15 0505  WBC 13.0* 22.2* 13.4* 11.7*  NEUTROABS 10.2*  --  10.7* 9.4*  HGB 11.8* 13.7 11.7* 11.8*  HCT 37.1* 42.5 38.1* 37.0*  MCV 89.8 89.5 91.8 90.7  PLT 158 205 175 176   No results for input(s): CKTOTAL, CKMB, CKMBINDEX, TROPONINI in the last 168 hours. No results for input(s): LABPROT, INR in the last 72 hours. No results for input(s): COLORURINE, LABSPEC, PHURINE, GLUCOSEU, HGBUR, BILIRUBINUR, KETONESUR, PROTEINUR, UROBILINOGEN, NITRITE, LEUKOCYTESUR  in the last 72 hours.  Invalid input(s): APPERANCEUR     Component Value Date/Time   CHOL 178 02/09/2015 0745   TRIG 121 02/12/2015 1141   HDL 62 02/09/2015 0745   CHOLHDL 2.9 02/09/2015 0745   VLDL 9 02/09/2015 0745   LDLCALC 107* 02/09/2015 0745   Lab Results  Component Value Date   HGBA1C 6.0* 02/09/2015      Component Value Date/Time   LABOPIA NONE DETECTED 02/08/2015 1823   COCAINSCRNUR NONE DETECTED 02/08/2015 1823   LABBENZ NONE DETECTED 02/08/2015 1823   AMPHETMU NONE DETECTED 02/08/2015 1823   THCU NONE DETECTED 02/08/2015 1823   LABBARB NONE DETECTED 02/08/2015 1823    No results for input(s): ETH in the last 168 hours.  I have personally reviewed the radiological images below and agree with the radiology interpretations.  Ct Head Wo Contrast 02/16/2015 - Stable subarachnoid hemorrhage seen overlying right parietal cortex. Stable large left occipital intraparenchymal hemorrhage is noted. with surrounding white matter edema resulting in approximately 6 mL. of left to right midline shift. Stable left-sided subdural hematoma is noted. Stable bilateral lateral intraventricular hemorrhage is noted without ventricular dilatation.  02/10/2015 - 1. No significant interval change in size of left occipital lobe intraparenchymal hematoma with slightly increased associated vasogenic edema as  compared to previous. Similar trace left-to-right shift. 2. Stable to slightly decreased conspicuity of bilateral small volume subarachnoid hemorrhage. 3. Stable intraventricular hemorrhage. No hydrocephalus. 4. Persistent left subdural hematoma measuring up to 3.5 mm in maximal diameter. Blood along the left tentorium is decreased in conspicuity. 5. No new intracranial abnormality.  02/09/2015   IMPRESSION: 1. Slight enlargement of large left occipital lobe intra-axial hemorrhage. Estimated hemorrhage volume now 58 mm. Stable left posterior hemisphere edema and mass effect. 2. Mild  progression of subarachnoid extension, now bilateral. Extension and of a left subdural space with small broad-based left subdural hematoma not significantly changed. Stable intraventricular hemorrhage volume. No ventriculomegaly. 3. No new intracranial abnormality identified.      02/08/2015   IMPRESSION: 1. 6.7 cm left occipital parenchymal hemorrhage with mild surrounding edema. No midline shift. 2. Small amount of adjacent subarachnoid hemorrhage and small amount of intraventricular extension of blood. 3. Small subdural hematomas over the left cerebral convexity and left tentorium.   Dg Chest Port 1 View 02/13/15 1. Stable support apparatus. 2. Improved basilar atelectasis. 02/10/15 Tube and catheter positions as described without pneumothorax. No edema or consolidation. 02/09/2015    IMPRESSION: No acute cardiopulmonary disease.     Carotid Doppler  No evidence of significant stenosis in the right or left ICA. The left side was technically challenged due to line placement.   2D Echocardiogram  Left ventricle: The cavity size was normal. Wall thickness was increased in a pattern of moderate LVH. Systolic function was normal. The estimated ejection fraction was in the range of 55% to 60%. Doppler parameters are consistent with elevated ventricular end-diastolic filling pressure. - Aortic valve: Poorly seen Moderately calcified mild stenosis by gradients but may be underestimated due to poor CW angle. Valve area (VTI): 2.11 cm^2. Valve area (Vmax): 2.01 cm^2. Valve area (Vmean): 1.88 cm^2. - Mitral valve: There was mild regurgitation. - Left atrium: The atrium was moderately dilated. - Impressions: Cannot r/o SOE due to extremely poor image quality. Impressions: - Cannot r/o SOE due to extremely poor image quality.  MRI and MRA  7.3 x 3.3 cm left occipital hematoma unchanged from the prior CT. There is intraventricular hemorrhage and subarachnoid hemorrhage also unchanged. There  is mass-effect on the left occipital horn and 3 mm midline shift to the right. No evidence of underlying tumor or vascular malformation. This may be related to cerebral amyloid angiography. Hypertension also possible. Negative MRA circle Willis.  EEG This EEG is abnormal with moderately severe generalized continuous nonspecific slowing of cerebral activity. No evidence of epileptic activity was recorded. The absence of epileptiform activity during an EEG recording does not, in and of itself, rule out seizure disorder, however.  EKG  NSR  PHYSICAL EXAM  Temp:  [98 F (36.7 C)-100.2 F (37.9 C)] 100.2 F (37.9 C) (05/18 1206) Pulse Rate:  [49-96] 73 (05/18 1308) Resp:  [11-30] 16 (05/18 1308) BP: (102-195)/(51-106) 190/83 mmHg (05/18 1308) SpO2:  [89 %-100 %] 100 % (05/18 1308) Weight:  [209 lb 7 oz (95 kg)] 209 lb 7 oz (95 kg) (05/18 0358)  General - Well nourished, well developed, no acute distress.  Ophthalmologic - fundi not visualized due to incorporation.  Cardiovascular - Regular rate and rhythm.  Mental Status -  Level of arousal and orientation to place were intact and person, but not orientated to time or situation. Language including expression, repetition are intact, able to follow simple commands, but not able to name and repeat. Moderate perseveration. Right  side neglect, improving.  Cranial Nerves II - XII - II - not blinking to visual threat on the right. III, IV, VI - left eye gaze preference but able to cross midline. V - Facial sensation intact bilaterally. VII - Facial movement intact bilaterally. VIII - Hard of hearing & vestibular intact bilaterally. X - Palate elevates symmetrically. XI - Chin turning & shoulder shrug intact bilaterally. XII - Tongue protrusion intact.  Motor Strength - The patient's strength was normal in all extremities and pronator drift was absent.  Bulk was normal and fasciculations were absent.   Motor Tone - Muscle tone was  assessed at the neck and appendages and was normal.  Reflexes - The patient's reflexes were 1+ in all extremities and he had no pathological reflexes.  Sensory - Light touch, temperature/pinprick were assessed and were symmetrical.    Coordination - not cooperative on exam.  Tremor was absent.  Gait and Station - not tested.   ASSESSMENT/PLAN [name] is a 80 y.o. male with history of HTN, DM, CAD, a flutter on Coumadin before until 2010, alcohol abuse admitted for headache, nausea vomiting, confusion, blurred vision. CT had showed left occipital ICH with small SAH, SDH and IVH. Symptoms and changed and neuro stable. Clinically stable and much improved.  Left occipital ICH with small SAH, SDH and IVH - etiology unclear, HTN versus CAA   MRI  Stable ICH, SDH and IVH - no new bleeding and minimal midline shift   MRA  unremarkable   Repeat CT showed stable hematoma with slight extension of SAH and SDH, stable IVH  Carotid Doppler  unremarkable  2D Echo  EF 55-60%  EEG no seizure  LDL 107, not at goal  HgbA1c 6.0  heparin subq for VTE prophylaxis  DIET - DYS 1 Room service appropriate?: Yes; Fluid consistency:: Thin   no antithrombotic prior to admission, now on no antithrombotic due to ICH  Ongoing aggressive stroke risk factor management  Therapy recommendations:  SNF  Disposition:  Pending  Hyponatremia/hypernatremia  Concerning for SIADH vs. salt wasting initially  Sodium from 143-> 131 -> 138 -> 145->154->159->156 ->152->162->152  Off 3% saline  Na goal normalization now  Discontinue D5 and put on NS @ 30cc  Encourage po fluid intake  Diabetes  HbA1c 6.0, goal < 7.0  Controlled  CBG monitoring  SSI  Hypertension  Home meds:  Coreg, losartan BP goal < 160 Add hydralazine 50mg  tid  under controlled  Close monitoring  Hyperlipidemia  Home meds:   Vytorin  LDL 107, goal < 70  Add lipitor 10mg   Continue statin at discharge  A.  flutter  Patient history of a flutter  Was on Coumadin in the past  no anticoagulation or antiplatelet PTA  No a flutter on telemetry  Patient may not be a candidate for anticoagulation  Agitation   May have sundowning  Add seroquel Qhs  Wake-sleep cycle adjustment  Other Stroke Risk Factors  Advanced age  ETOH use  Obesity, Body mass index is 33.82 kg/(m^2).   Other Active Problems  Mild hypokalemia  Leukocytosis  Other Pertinent History    Hospital day # 11  This patient is critically ill due to respiratory failure, large left occipital ICH with SAH and IVH, hyponatremia and at significant risk of neurological worsening, death form respiratory failure, rebleeding, cerebral edema, brain herniation, and status epilepticus. This patient's care requires constant monitoring of vital signs, hemodynamics, respiratory and cardiac monitoring, review of multiple databases, neurological  assessment, discussion with family, other specialists and medical decision making of high complexity. I spent 40 minutes of neurocritical care time in the care of this patient.  Marvel Plan, MD PhD Stroke Neurology 02/19/2015 1:53 PM  To contact Stroke Continuity provider, please refer to [email or web address]. After hours, contact General Neurology

## 2015-02-19 NOTE — Progress Notes (Signed)
eLink Physician-Brief Progress Note Patient Name: Austin CheLeon Liberto DOB: 08/26/1935 MRN: [id number]   Date of Service  02/19/2015  HPI/Events of Note  Heart rhythm appears to alternate between NSR and AFIB. He does not stay at the higher heart rates very long. Has a hx of AFIB.  eICU Interventions  Will re-check Magnesium and BMP now. Consider restarting Amiodarone if he sustains high heart rates.      Intervention Category Intermediate Interventions: Arrhythmia - evaluation and management  Sommer,Steven Eugene 02/19/2015, 5:56 PM

## 2015-02-20 DIAGNOSIS — R451 Restlessness and agitation: Secondary | ICD-10-CM

## 2015-02-20 LAB — GLUCOSE, CAPILLARY
GLUCOSE-CAPILLARY: 119 mg/dL — AB (ref 65–99)
GLUCOSE-CAPILLARY: 103 mg/dL — AB (ref 65–99)
GLUCOSE-CAPILLARY: 109 mg/dL — AB (ref 65–99)
Glucose-Capillary: 126 mg/dL — ABNORMAL HIGH (ref 65–99)

## 2015-02-20 LAB — CBC WITH DIFFERENTIAL/PLATELET
Basophils Absolute: 0 10*3/uL (ref 0.0–0.1)
Basophils Relative: 0 % (ref 0–1)
Eosinophils Absolute: 0.3 10*3/uL (ref 0.0–0.7)
Eosinophils Relative: 2 % (ref 0–5)
HEMATOCRIT: 36.4 % — AB (ref 39.0–52.0)
Hemoglobin: 11.5 g/dL — ABNORMAL LOW (ref 13.0–17.0)
LYMPHS PCT: 12 % (ref 12–46)
Lymphs Abs: 1.9 10*3/uL (ref 0.7–4.0)
MCH: 28.2 pg (ref 26.0–34.0)
MCHC: 31.6 g/dL (ref 30.0–36.0)
MCV: 89.2 fL (ref 78.0–100.0)
MONO ABS: 0.6 10*3/uL (ref 0.1–1.0)
MONOS PCT: 4 % (ref 3–12)
Neutro Abs: 12.4 10*3/uL — ABNORMAL HIGH (ref 1.7–7.7)
Neutrophils Relative %: 82 % — ABNORMAL HIGH (ref 43–77)
Platelets: 179 10*3/uL (ref 150–400)
RBC: 4.08 MIL/uL — ABNORMAL LOW (ref 4.22–5.81)
RDW: 14.2 % (ref 11.5–15.5)
WBC: 14.3 10*3/uL — AB (ref 4.0–10.5)

## 2015-02-20 LAB — BASIC METABOLIC PANEL
Anion gap: 8 (ref 5–15)
BUN: 20 mg/dL (ref 6–20)
CHLORIDE: 116 mmol/L — AB (ref 101–111)
CO2: 23 mmol/L (ref 22–32)
Calcium: 7.9 mg/dL — ABNORMAL LOW (ref 8.9–10.3)
Creatinine, Ser: 1.22 mg/dL (ref 0.61–1.24)
GFR calc Af Amer: >60 mL/min (ref >60)
GFR calc non Af Amer: 55 mL/min — ABNORMAL LOW (ref >60)
GLUCOSE: 114 mg/dL — AB (ref 65–99)
Potassium: 3.9 mmol/L (ref 3.5–5.1)
Sodium: 147 mmol/L — ABNORMAL HIGH (ref 135–145)

## 2015-02-20 LAB — MAGNESIUM: MAGNESIUM: 2.3 mg/dL (ref 1.7–2.4)

## 2015-02-20 LAB — PHOSPHORUS: Phosphorus: 3 mg/dL (ref 2.5–4.6)

## 2015-02-20 MED ORDER — CARVEDILOL 12.5 MG PO TABS
25.0000 mg | ORAL_TABLET | Freq: Two times a day (BID) | ORAL | Status: DC
  Administered 2015-02-20 – 2015-02-21 (×3): 25 mg via ORAL
  Filled 2015-02-20: qty 2
  Filled 2015-02-20 (×2): qty 1
  Filled 2015-02-20: qty 2
  Filled 2015-02-20: qty 1

## 2015-02-20 MED ORDER — LISINOPRIL 20 MG PO TABS
20.0000 mg | ORAL_TABLET | Freq: Two times a day (BID) | ORAL | Status: DC
  Administered 2015-02-20 – 2015-02-21 (×3): 20 mg via ORAL
  Filled 2015-02-20 (×4): qty 1

## 2015-02-20 MED ORDER — HYDRALAZINE HCL 20 MG/ML IJ SOLN
10.0000 mg | INTRAMUSCULAR | Status: DC | PRN
  Administered 2015-02-20 – 2015-02-21 (×2): 20 mg via INTRAVENOUS
  Filled 2015-02-20 (×2): qty 1

## 2015-02-20 NOTE — Clinical Social Work Note (Signed)
Clinical Social Worker continuing to follow patient and family for support and discharge planning needs.  CSW met with patient son and daughter at bedside to provide bed offers.  Patient family plans to go visit facility options today and provide bed choice to CSW.  CSW to submit 30 day [facility] note once signed.  CSW remains available for support and to facilitate patient discharge needs once medically stable.  Barbette Or, [place]

## 2015-02-20 NOTE — Progress Notes (Signed)
PULMONARY / CRITICAL CARE MEDICINE   Name: Austin Sawyer MRN: [id number] DOB: 07-01-1935    ADMISSION DATE:  02/08/2015 CONSULTATION DATE:  02/10/2015  REFERRING MD :  Neurology  CHIEF COMPLAINT:  ICH, headache and vomiting  INITIAL PRESENTATION: 80 year old male with PMH of HTN presenting with ICH on 5/7.  On 5/9 patient decompensated and was unable to protect his airway.  PCCM was called on consultation for intubation and TLC placement for 3% saline.  STUDIES:  5/9 repeat head CT with edema and ICH that was stable. 5/10 mri brain - 7.3 x 3.3 cm left occipital hematoma unchanged from the prior CT. There is intraventricular hemorrhage and subarachnoid hemorrhage also unchanged. There is mass-effect on the left occipital horn and 3 mm midline shift to the right.  5/11 - echo - 55% , mild AS by gradient only  SIGNIFICANT EVENTS: 5/7 ICH on CT and AMS. 5/9 Unable to protect his airway and PCCM consulted. 5/10- remains HTN 5.15/16: Pt with short bursts of SVT over the last 24h, to rates as high as 200's. Attempted PAnda placement but unsuccessful. Currently HR in 70's 02/17/15: Still no luck with panda. HR consistently in 80s and sinus. Amio stopped and hdyralazine (having sVTs)  and precedex added for agitation. Na high and unasyn was stopped  02/18/2015: Precedex really helped with agitation.  Precedex now off x 1/2h -> continues to be calm and cooperative. Na high 12 despite unasyn dc yesterday: D5w being started by neuro . PT notes recommending CIR. Repeat speech eval pending  02/19/15: Has breief run SVT yesterday afternoon 200s while off precedex and also got agiated and confused - needed to go back on precedex which helped. This Morning Neuro MD - has attempted dc precedex again with switch to seroquel. Needed restratints overnight. Na improved to 152 in 24h and d5W stopped. Hx from chart review and RN at bedside and neuro MD at bedside   SUBJECTIVE/OVERNIGHT/INTERVAL HX 02/20/2015:   D/w Dr Roda Shutters and RN at bedside - gave hx - > Off precedex x 24h. Maintained seroquel. Family at bedside. Mostly bradycardic. Now hypertensive and on multiple BP meds including hydralaziine due to high bp. No more SVT.s Mostly bradycardic. Cards note reviewed: recommended obs Rx with beta blocker if HR will allow and signed off. Not agitated anymore. Seroquel helped  Na and Creat improved   VITAL SIGNS: Temp:  [98.6 F (37 C)-100.5 F (38.1 C)] 99.2 F (37.3 C) (05/19 0828) Pulse Rate:  [64-99] 64 (05/19 0700) Resp:  [11-35] 19 (05/19 0700) BP: (89-202)/(59-120) 139/60 mmHg (05/19 0700) SpO2:  [96 %-100 %] 100 % (05/19 0700) Weight:  [92.8 kg (204 lb 9.4 oz)] 92.8 kg (204 lb 9.4 oz) (05/19 0500) HEMODYNAMICS:   VENTILATOR SETTINGS:   INTAKE / OUTPUT:  Intake/Output Summary (Last 24 hours) at 02/20/15 0940 Last data filed at 02/20/15 0500  Gross per 24 hour  Intake  676.5 ml  Output    275 ml  Net  401.5 ml    PHYSICAL EXAMINATION: General:  Ill appearing, deconditioned Neuro: RASS +1 - confusd but not agitated - improved HEENT: poor hearing, espec on L, Op clear, poor dentition Cardiovascular:  RRR, no M Lungs:  Clear  Abdomen:  Soft, NT, ND and +BS. Musculoskeletal: no edema Skin:  Intact.  LABS:  PULMONARY No results for input(s): PHART, PCO2ART, PO2ART, HCO3, TCO2, O2SAT in the last 168 hours.  Invalid input(s): PCO2, PO2  CBC  Recent Labs Lab 02/18/15  [id number] 02/19/15 0505 02/20/15 0250  HGB 11.7* 11.8* 11.5*  HCT 38.1* 37.0* 36.4*  WBC 13.4* 11.7* 14.3*  PLT 175 176 179    COAGULATION No results for input(s): INR in the last 168 hours.  CARDIAC   No results for input(s): TROPONINI in the last 168 hours. No results for input(s): PROBNP in the last 168 hours.   CHEMISTRY  Recent Labs Lab 02/17/15 1013 02/18/15 0427 02/19/15 0505 02/19/15 1911 02/20/15 0250  NA 162* 162* 152* 148* 147*  K 3.3* 3.8 3.4* 3.5 3.9  CL 122* 124* 118* 115* 116*   CO2 27 28 26 24 23   GLUCOSE 97 100* 132* 116* 114*  BUN 35* 35* 26* 22* 20  CREATININE 1.52* 1.40* 1.31* 1.26* 1.22  CALCIUM 8.3* 8.2* 7.9* 8.3* 7.9*  MG 2.7* 2.8* 2.8* 2.3 2.3  PHOS  --  4.9* 4.0  --  3.0   Estimated Creatinine Clearance: 53.2 mL/min (by C-G formula based on Cr of 1.22).   LIVER No results for input(s): AST, ALT, ALKPHOS, BILITOT, PROT, ALBUMIN, INR in the last 168 hours.   INFECTIOUS  Recent Labs Lab 02/17/15 1013 02/18/15 0427 02/19/15 0505  PROCALCITON 0.11 <0.10 <0.10     ENDOCRINE CBG (last 3)   Recent Labs  02/19/15 1734 02/19/15 2144 02/20/15 0828  GLUCAP 105* 112* 119*         IMAGING x48h  -No results found.      ASSESSMENT / PLAN:  PULMONARY OETT 5/9>>> 5/13 A: VDRF due to inability to protect airway, AMS, resolved. ATX, improved    - oon 02/20/2015: reamins extubated post extubation. Agitation improving and risk for reintubatin has improved significantly to low levels  P:   Push pulm hygiene Monitor for intubation risk even in floor  CARDIOVASCULAR CVL L IJ TLC 5/9>>> A: HTN in setting ICH SVT Hx A Flutter   - 02/17/15: 24h of amio gtt stopped. Hydralazine stopped due to risk for reflex tachycardia.  - 02/18/15: doing well HR 60 on scheduled lopressor + precedex (lopressor held due to prcedex) - 02/19/15 - Did have SVT again yesterday. Currently HR 60s off precedex - 02/20/15: BP high and on muliple bp meds. HR 60s. No more SVTs. Cards recommended conservative Rx  P:  Continue  bp meds hydralazine, coreg and lisinopril  - neuro to monitor renal function on ace inhibuitor  RENAL A:   #. Hyponatremia on presentation. Hypernatremia (intentional)  - 3% saline stopped 02/13/15 and Unasyn stopped 02/17/15  -Na improved significantly  P:   Saline lock    GASTROINTESTINAL A:  Nutrition needs   -02/19/15: D1 diet recommended  P:   PPI d1 diet per speech as of 02/20/15   HEMATOLOGIC  Recent Labs Lab  02/18/15 0427 02/19/15 0505 02/20/15 0250  HGB 11.7* 11.8* 11.5*  HCT 38.1* 37.0* 36.4*  WBC 13.4* 11.7* 14.3*  PLT 175 176 179     A:   #leukocytosis 0- worse 02/16/15, nearly reslved 02/19/15 #Mild anemia of critical illness - unchanged  P:  Follow CBC limit phlebotomy when able - PRBC for hgb  < 7gm% SCDs  INFECTIOUS   A: r/o LLL PNA, from aspiration pre ETT - culture negative - results reviewed. PCT low 02/17/15   - no evidence of infection 02/20/15 P:   unasyn 5/11>>> . 02/17/15 Monitor clinically   ENDOCRINE A:  No diabetes by history.   P:   Monitor glu with BMET  NEUROLOGIC #Baseline  - HOH  -  Dementia NOS  #Current A:  -  ICH at admit 02/08/2015 - Acute agitation severe with encephalopathy 02/17/15 - responded  to precedex but recurred 02/18/15 off precedex. Huge improvement with seroquel and off precedex x 24h   P:   Dc precedex Agree with seroquel RASS goal: 0 Will need rehab   FAMILY  - Updates: Dr Delton CoombesByrum updated family at bedside 5/14 and 5/15. None at bedside 02/17/15 and 02/18/15 and 02/19/15. Updated daughter 02/20/15  [facility] Dr Roda ShuttersXu neurology 02/20/15. Ok to move to floor 02/20/2015. PCCM wil sign off      Dr. Kalman ShanMurali Jahleel Stroschein, M.D., [facility] Pulmonary and Critical Care Medicine Staff Physician [facility] [place] [facility] Pager: [phone], If no answer or between  15:00h - 7:00h: call [phone]  02/20/2015 9:40 AM

## 2015-02-20 NOTE — Progress Notes (Signed)
STROKE TEAM PROGRESS NOTE   SUBJECTIVE (INTERVAL HISTORY) Daughter is at the bedside. More awake alert today, no acute issue overnight with seroquel. Off precedex over 24 hours. BP still high, add lisinopril. Will transfer to floor.   OBJECTIVE Temp:  [98.6 F (37 C)-100.5 F (38.1 C)] 99.2 F (37.3 C) (05/19 0828) Pulse Rate:  [60-99] 69 (05/19 1000) Cardiac Rhythm:  [-] Sinus bradycardia;Normal sinus rhythm (05/19 0800) Resp:  [11-35] 18 (05/19 1000) BP: (89-202)/(53-120) 135/59 mmHg (05/19 1000) SpO2:  [96 %-100 %] 99 % (05/19 1000) Weight:  [204 lb 9.4 oz (92.8 kg)] 204 lb 9.4 oz (92.8 kg) (05/19 0500)   Recent Labs Lab 02/19/15 0739 02/19/15 1205 02/19/15 1734 02/19/15 2144 02/20/15 0828  GLUCAP 123* 87 105* 112* 119*    Recent Labs Lab 02/17/15 1013 02/18/15 0427 02/19/15 0505 02/19/15 1911 02/20/15 0250  NA 162* 162* 152* 148* 147*  K 3.3* 3.8 3.4* 3.5 3.9  CL 122* 124* 118* 115* 116*  CO2 27 28 26 24 23   GLUCOSE 97 100* 132* 116* 114*  BUN 35* 35* 26* 22* 20  CREATININE 1.52* 1.40* 1.31* 1.26* 1.22  CALCIUM 8.3* 8.2* 7.9* 8.3* 7.9*  MG 2.7* 2.8* 2.8* 2.3 2.3  PHOS  --  4.9* 4.0  --  3.0   No results for input(s): AST, ALT, ALKPHOS, BILITOT, PROT, ALBUMIN in the last 168 hours.  Recent Labs Lab 02/16/15 0553 02/18/15 0427 02/19/15 0505 02/20/15 0250  WBC 22.2* 13.4* 11.7* 14.3*  NEUTROABS  --  10.7* 9.4* 12.4*  HGB 13.7 11.7* 11.8* 11.5*  HCT 42.5 38.1* 37.0* 36.4*  MCV 89.5 91.8 90.7 89.2  PLT 205 175 176 179   No results for input(s): CKTOTAL, CKMB, CKMBINDEX, TROPONINI in the last 168 hours. No results for input(s): LABPROT, INR in the last 72 hours. No results for input(s): COLORURINE, LABSPEC, PHURINE, GLUCOSEU, HGBUR, BILIRUBINUR, KETONESUR, PROTEINUR, UROBILINOGEN, NITRITE, LEUKOCYTESUR in the last 72 hours.  Invalid input(s): APPERANCEUR     Component Value Date/Time   CHOL 178 02/09/2015 0745   TRIG 121 02/12/2015 1141   HDL 62  02/09/2015 0745   CHOLHDL 2.9 02/09/2015 0745   VLDL 9 02/09/2015 0745   LDLCALC 107* 02/09/2015 0745   Lab Results  Component Value Date   HGBA1C 6.0* 02/09/2015      Component Value Date/Time   LABOPIA NONE DETECTED 02/08/2015 1823   COCAINSCRNUR NONE DETECTED 02/08/2015 1823   LABBENZ NONE DETECTED 02/08/2015 1823   AMPHETMU NONE DETECTED 02/08/2015 1823   THCU NONE DETECTED 02/08/2015 1823   LABBARB NONE DETECTED 02/08/2015 1823    No results for input(s): ETH in the last 168 hours.  I have personally reviewed the radiological images below and agree with the radiology interpretations.  Ct Head Wo Contrast 02/16/2015 - Stable subarachnoid hemorrhage seen overlying right parietal cortex. Stable large left occipital intraparenchymal hemorrhage is noted. with surrounding white matter edema resulting in approximately 6 mL. of left to right midline shift. Stable left-sided subdural hematoma is noted. Stable bilateral lateral intraventricular hemorrhage is noted without ventricular dilatation.  02/10/2015 - 1. No significant interval change in size of left occipital lobe intraparenchymal hematoma with slightly increased associated vasogenic edema as compared to previous. Similar trace left-to-right shift. 2. Stable to slightly decreased conspicuity of bilateral small volume subarachnoid hemorrhage. 3. Stable intraventricular hemorrhage. No hydrocephalus. 4. Persistent left subdural hematoma measuring up to 3.5 mm in maximal diameter. Blood along the left tentorium is decreased in conspicuity. 5.  No new intracranial abnormality.  02/09/2015   IMPRESSION: 1. Slight enlargement of large left occipital lobe intra-axial hemorrhage. Estimated hemorrhage volume now 58 mm. Stable left posterior hemisphere edema and mass effect. 2. Mild progression of subarachnoid extension, now bilateral. Extension and of a left subdural space with small broad-based left subdural hematoma not significantly  changed. Stable intraventricular hemorrhage volume. No ventriculomegaly. 3. No new intracranial abnormality identified.      02/08/2015   IMPRESSION: 1. 6.7 cm left occipital parenchymal hemorrhage with mild surrounding edema. No midline shift. 2. Small amount of adjacent subarachnoid hemorrhage and small amount of intraventricular extension of blood. 3. Small subdural hematomas over the left cerebral convexity and left tentorium.   Dg Chest Port 1 View 02/13/15 1. Stable support apparatus. 2. Improved basilar atelectasis. 02/10/15 Tube and catheter positions as described without pneumothorax. No edema or consolidation. 02/09/2015    IMPRESSION: No acute cardiopulmonary disease.     Carotid Doppler  No evidence of significant stenosis in the right or left ICA. The left side was technically challenged due to line placement.   2D Echocardiogram  Left ventricle: The cavity size was normal. Wall thickness was increased in a pattern of moderate LVH. Systolic function was normal. The estimated ejection fraction was in the range of 55% to 60%. Doppler parameters are consistent with elevated ventricular end-diastolic filling pressure. - Aortic valve: Poorly seen Moderately calcified mild stenosis by gradients but may be underestimated due to poor CW angle. Valve area (VTI): 2.11 cm^2. Valve area (Vmax): 2.01 cm^2. Valve area (Vmean): 1.88 cm^2. - Mitral valve: There was mild regurgitation. - Left atrium: The atrium was moderately dilated. - Impressions: Cannot r/o SOE due to extremely poor image quality. Impressions: - Cannot r/o SOE due to extremely poor image quality.  MRI and MRA  7.3 x 3.3 cm left occipital hematoma unchanged from the prior CT. There is intraventricular hemorrhage and subarachnoid hemorrhage also unchanged. There is mass-effect on the left occipital horn and 3 mm midline shift to the right. No evidence of underlying tumor or vascular malformation. This may be  related to cerebral amyloid angiography. Hypertension also possible. Negative MRA circle Willis.  EEG This EEG is abnormal with moderately severe generalized continuous nonspecific slowing of cerebral activity. No evidence of epileptic activity was recorded. The absence of epileptiform activity during an EEG recording does not, in and of itself, rule out seizure disorder, however.  EKG  NSR  PHYSICAL EXAM  Temp:  [98.6 F (37 C)-100.5 F (38.1 C)] 99.2 F (37.3 C) (05/19 0828) Pulse Rate:  [60-99] 69 (05/19 1000) Resp:  [11-35] 18 (05/19 1000) BP: (89-202)/(53-120) 135/59 mmHg (05/19 1000) SpO2:  [96 %-100 %] 99 % (05/19 1000) Weight:  [204 lb 9.4 oz (92.8 kg)] 204 lb 9.4 oz (92.8 kg) (05/19 0500)  General - Well nourished, well developed, no acute distress.  Ophthalmologic - fundi not visualized due to incorporation.  Cardiovascular - Regular rate and rhythm.  Mental Status -  Level of arousal and orientation to place were intact and person, but not orientated to time or situation. Language including expression, repetition are intact, able to follow simple commands, but not able to name and repeat. Moderate perseveration.   Cranial Nerves II - XII - II - blinking to visual threat bilaterally. III, IV, VI - PERRL, EOMI V - Facial sensation intact bilaterally. VII - Facial movement intact bilaterally. VIII - Hard of hearing & vestibular intact bilaterally. X - Palate elevates symmetrically. XI -  Chin turning & shoulder shrug intact bilaterally. XII - Tongue protrusion intact.  Motor Strength - The patient's strength was normal in all extremities and pronator drift was absent.  Bulk was normal and fasciculations were absent.   Motor Tone - Muscle tone was assessed at the neck and appendages and was normal.  Reflexes - The patient's reflexes were 1+ in all extremities and he had no pathological reflexes.  Sensory - Light touch, temperature/pinprick were assessed and were  symmetrical.    Coordination - not cooperative on exam.  Tremor was absent.  Gait and Station - not tested.   ASSESSMENT/PLAN Mr. Candise CheLeon Bowell is a 80 y.o. male with history of HTN, DM, CAD, a flutter on Coumadin before until 2010, alcohol abuse admitted for headache, nausea vomiting, confusion, blurred vision. CT had showed left occipital ICH with small SAH, SDH and IVH. Symptoms and changed and neuro stable. Clinically stable and much improved.  Left occipital ICH with small SAH, SDH and IVH - etiology unclear, HTN versus CAA   MRI  Stable ICH, SDH and IVH - no new bleeding and minimal midline shift   MRA  unremarkable   Repeat CT showed stable hematoma with slight extension of SAH and SDH, stable IVH  Carotid Doppler  unremarkable  2D Echo  EF 55-60%  EEG no seizure  LDL 107, not at goal  HgbA1c 6.0  heparin subq for VTE prophylaxis  DIET - DYS 1 Room service appropriate?: Yes; Fluid consistency:: Thin   no antithrombotic prior to admission, now on no antithrombotic due to ICH  Ongoing aggressive stroke risk factor management  Therapy recommendations:  SNF  Disposition:  Transfer to floor  Hyponatremia/hypernatremia  Concerning for SIADH vs. salt wasting initially  Sodium from 143-> 131 -> 138 -> 145->154->159->156 ->152->162->152->147  Off 3% saline  Na goal normalization now  Off IVF now  Encourage po fluid intake  Diabetes  HbA1c 6.0, goal < 7.0  Controlled  CBG monitoring  SSI  Hypertension  Home meds:  Coreg, losartan BP goal < 160 continue hydralazine 50mg  tid and coreg Add lisinopril  Not well controlled  Close monitoring  Hyperlipidemia  Home meds:   Vytorin  LDL 107, goal < 70  Add lipitor 10mg   Continue statin at discharge  A. flutter  Patient history of a flutter  Was on Coumadin in the past  no anticoagulation or antiplatelet PTA  No a flutter on telemetry  Patient may not be a candidate for  anticoagulation  Agitation / dementia  Symptoms with sundowning  Add seroquel Qhs  Wake-sleep cycle adjustment  Other Stroke Risk Factors  Advanced age  ETOH use  Obesity, Body mass index is 33.04 kg/(m^2).   Other Active Problems  Mild hypokalemia  Leukocytosis  Other Pertinent History    Hospital day # 12   Marvel PlanJindong Shandee Jergens, MD PhD Stroke Neurology 02/20/2015 10:57 AM  To contact Stroke Continuity provider, please refer to [email or web address]. After hours, contact General Neurology

## 2015-02-20 NOTE — Clinical Social Work Placement (Signed)
   CLINICAL SOCIAL WORK PLACEMENT  NOTE  Date:  02/20/2015  Patient Details  Name: Austin Sawyer MRN: [id number] Date of Birth: 06/10/1935  Clinical Social Work is seeking post-discharge placement for this patient at the Skilled  Nursing Facility level of care (*CSW will initial, date and re-position this form in  chart as items are completed):  Yes   Patient/family provided with [place] Clinical Social Work Department's list of facilities offering this level of care within the geographic area requested by the patient (or if unable, by the patient's family).  Yes   Patient/family informed of their freedom to choose among providers that offer the needed level of care, that participate in Medicare, Medicaid or managed care program needed by the patient, have an available bed and are willing to accept the patient.  Yes   Patient/family informed of [facility] ownership interest [facility] and [facility], as well as of the fact that they are under no obligation to receive care at these facilities.  PASRR submitted to EDS on 02/19/15     PASRR number received on       Existing PASRR number confirmed on       FL2 transmitted to all facilities in geographic area requested by pt/family on 02/18/15     FL2 transmitted to all facilities within larger geographic area on       Patient informed that his/her managed care company has contracts with or will negotiate with certain facilities, including the following:        Yes   Patient/family informed of bed offers received.  Patient chooses bed at       Physician recommends and patient chooses bed at      Patient to be transferred to   on  .  Patient to be transferred to facility by       Patient family notified on   of transfer.  Name of family member notified:        PHYSICIAN Please sign FL2 (Please sign 30 day note for [facility] completion)     Additional Comment:     _______________________________________________ Legrand ComoScinto, Mesha Schamberger Lynn, LCSW 02/20/2015, 10:18 AM

## 2015-02-20 NOTE — Evaluation (Signed)
Occupational Therapy Evaluation Patient Details Name: Austin Sawyer MRN: [id number] DOB: 08/22/1935 Today's Date: 02/20/2015    History of Present Illness Austin Sawyer is an 80 y.o. male with a past medical history significant for HTN, DM type 2, CAD, MI, atrial flutter, alcohol abuse in remission, who initially presented to [facility] ED with complains of severe HA, nausea, vision blurriness, confusion, and trouble finding words. CT revealed ICH of L occipital lobe.   Clinical Impression   Pt admitted with above. He demonstrates the below listed deficits and will benefit from continued OT to maximize safety and independence with BADLs.  Pt demonstrates Rt inattention/neglect, impaired cognition, Rt hemiparesis.   He requires mod- max  A for simple grooming tasks, and max - total A for all other aspects of ADLs.  Recommend SNF level rehab at discharge.       Follow Up Recommendations  SNF;Supervision/Assistance - 24 hour    Equipment Recommendations  None recommended by OT    Recommendations for Other Services       Precautions / Restrictions Precautions Precautions: Fall Restrictions Weight Bearing Restrictions: No      Mobility Bed Mobility Overal bed mobility: Needs Assistance;+2 for physical assistance Bed Mobility: Supine to Sit;Sit to Supine     Supine to sit: Mod assist;+2 for physical assistance Sit to supine: Mod assist;+2 for physical assistance   General bed mobility comments: Assist to move LEs off bed and assist to lift trunk from bed.  Pt initially refusing activity   Transfers Overall transfer level: Needs assistance Equipment used: 2 person hand held assist Transfers: Sit to/from Stand Sit to Stand: Mod assist;+2 physical assistance         General transfer comment: Pt requires facilitation at pelvis and shoulders for hip and trunk extension     Balance Overall balance assessment: Needs assistance Sitting-balance support: Feet supported Sitting  balance-Leahy Scale: Poor Sitting balance - Comments: Pt requires min guard to mod A (variable) Postural control: Right lateral lean Standing balance support: Bilateral upper extremity supported Standing balance-Leahy Scale: Zero                              ADL Overall ADL's : Needs assistance/impaired Eating/Feeding: Maximal assistance;Bed level   Grooming: Wash/dry face;Moderate assistance;Sitting Grooming Details (indicate cue type and reason): Pt requires mod A for sitting balance and mod A for thoroughness and initiation  Upper Body Bathing: Total assistance;Bed level   Lower Body Bathing: Total assistance;Bed level   Upper Body Dressing : Total assistance;Bed level   Lower Body Dressing: Total assistance;Bed level   Toilet Transfer: Moderate assistance;+2 for physical assistance;Stand-pivot;BSC   Toileting- Clothing Manipulation and Hygiene: Total assistance;Sit to/from stand       Functional mobility during ADLs: Moderate assistance;+2 for physical assistance (pivot transfers only ) General ADL Comments: Pt very difficult to engage in ADL tasks      Vision Additional Comments: Pt unable to engage in formal assessment.  He does not look to examiner on Lt or Rt.  He is unable to locate the clock on the wall even with max verbal cues, but states he sees it (looking in a different direction)   Perception Perception Perception Tested?: Yes Perception Deficits: Inattention/neglect Inattention/Neglect: Does not attend to right visual field;Does not attend to right side of body   Praxis Praxis Praxis tested?: Deficits Deficits: Initiation    Pertinent Vitals/Pain Pain Assessment: Faces Faces Pain Scale: No hurt  Hand Dominance     Extremity/Trunk Assessment Upper Extremity Assessment Upper Extremity Assessment: RUE deficits/detail;LUE deficits/detail RUE Deficits / Details: Pt with Rt neglect/inattention.  He did initiate spontaneous Rt UE movement  x 1, otherwise he will leave it on his lap  LUE Deficits / Details: Pt moves Lt UE spontaneously.  Unable to formally assess Lt UE due to impaired cognition    Lower Extremity Assessment Lower Extremity Assessment: Defer to PT evaluation   Cervical / Trunk Assessment Cervical / Trunk Assessment: Other exceptions Cervical / Trunk Exceptions: Pt keeps head/neck rotated to Lt to neutral.  He will look to Rt minimally, but not consistently    Communication Communication Communication: HOH (uses amplifier)   Cognition Arousal/Alertness: Awake/alert Behavior During Therapy: Restless Overall Cognitive Status: Impaired/Different from baseline Area of Impairment: Orientation;Attention;Memory;Following commands;Safety/judgement;Awareness;Problem solving Orientation Level: Disoriented to;Place;Time Current Attention Level: Focused;Sustained Memory: Decreased short-term memory Following Commands: Follows one step commands inconsistently Safety/Judgement: Decreased awareness of safety;Decreased awareness of deficits   Problem Solving: Slow processing;Decreased initiation;Difficulty sequencing;Requires verbal cues;Requires tactile cues General Comments: Pt demonstrates focused to brief periods of sustained attention.   He quotes scripture inappropriate to conversation, and is unable to engage in basic conversation.   Confabulatory at times.   He follows one step commands with mod verbal and tactile cues    General Comments       Exercises       Shoulder Instructions      Home Living Family/patient expects to be discharged to:: Skilled nursing facility Living Arrangements: Spouse/significant other Available Help at Discharge: Family;Available 24 hours/day Type of Home: House Home Access: Level entry     Home Layout: One level               Home Equipment: None   Additional Comments: wife can not physically assist patient      Prior Functioning/Environment Level of  Independence: Independent        Comments: Per chart review, pt was primary caregiver for spouse     OT Diagnosis: Generalized weakness;Cognitive deficits;Disturbance of vision;Hemiplegia dominant side   OT Problem List: Decreased strength;Decreased activity tolerance;Impaired balance (sitting and/or standing);Impaired vision/perception;Decreased coordination;Decreased cognition;Decreased safety awareness;Decreased knowledge of use of DME or AE;Impaired sensation;Impaired UE functional use   OT Treatment/Interventions: Self-care/ADL training;Neuromuscular education;DME and/or AE instruction;Therapeutic activities;Cognitive remediation/compensation;Visual/perceptual remediation/compensation;Patient/family education;Balance training    OT Goals(Current goals can be found in the care plan section) Acute Rehab OT Goals OT Goal Formulation: Patient unable to participate in goal setting Time For Goal Achievement: 03/06/15 Potential to Achieve Goals: Good ADL Goals Pt Will Perform Eating: with min assist;sitting Pt Will Perform Grooming: with min assist;sitting Pt Will Perform Upper Body Bathing: with mod assist;sitting Pt Will Transfer to Toilet: with mod assist;stand pivot transfer;bedside commode Additional ADL Goal #1: Pt will maintain sustained attention x 3 minutes to complete simple grooming task  Additional ADL Goal #2: Pt will follow one step commands 75% of time.   OT Frequency: Min 2X/week   Barriers to D/C: Decreased caregiver support          Co-evaluation              End of Session Nurse Communication: Mobility status  Activity Tolerance: Patient tolerated treatment well Patient left: in bed;with call bell/phone within reach   Time: [id number] OT Time Calculation (min): 19 min Charges:  OT General Charges $OT Visit: 1 Procedure OT Evaluation $Initial OT Evaluation Tier I: 1 Procedure G-Codes:    Nahomi Hegner, Ursula AlertWendi  M 02/20/2015, 1:03 PM

## 2015-02-20 NOTE — Progress Notes (Signed)
eLink Physician-Brief Progress Note Patient Name: Austin Sawyer DOB: 10/18/1934 MRN: [id number]   Date of Service  02/20/2015  HPI/Events of Note  High BP  eICU Interventions  Resume coreg -dc metoprolol Hydralazine prn        ALVA,RAKESH V. 02/20/2015, 5:17 AM

## 2015-02-20 NOTE — Progress Notes (Signed)
Physical Therapy Treatment Patient Details Name: Austin Sawyer MRN: [id number] DOB: 08/08/1935 Today's Date: 02/20/2015    History of Present Illness Austin Sawyer is an 80 y.o. male with a past medical history significant for HTN, DM type 2, CAD, MI, atrial flutter, alcohol abuse in remission, who initially presented to [facility] ED with complains of severe HA, nausea, vision blurriness, confusion, and trouble finding words. CT revealed ICH of L occipital lobe.    PT Comments    Pt limited by difficulty maintaining focus on task and getting easily agitated with therapeutic interventions.  Follow Up Recommendations  CIR;Supervision/Assistance - 24 hour     Equipment Recommendations  None recommended by PT    Recommendations for Other Services Rehab consult     Precautions / Restrictions Precautions Precautions: Fall Restrictions Weight Bearing Restrictions: No    Mobility  Bed Mobility Overal bed mobility: Needs Assistance;+2 for physical assistance Bed Mobility: Supine to Sit;Sit to Supine     Supine to sit: Mod assist;+2 for physical assistance Sit to supine: Mod assist;+2 for physical assistance   General bed mobility comments: Assist to move LEs off bed and assist to lift trunk from bed.  Pt initially refusing activity   Transfers Overall transfer level: Needs assistance Equipment used: 2 person hand held assist Transfers: Sit to/from Stand Sit to Stand: Mod assist;+2 physical assistance         General transfer comment: Pt requires facilitation at pelvis and shoulders for hip and trunk extension   Ambulation/Gait             General Gait Details: not able   Stairs            Wheelchair Mobility    Modified Rankin (Stroke Patients Only) Modified Rankin (Stroke Patients Only) Pre-Morbid Rankin Score: No symptoms Modified Rankin: Severe disability     Balance Overall balance assessment: Needs assistance Sitting-balance support: Feet  supported Sitting balance-Leahy Scale: Poor Sitting balance - Comments: Pt requires min guard to mod A (variable) Postural control: Right lateral lean Standing balance support: Bilateral upper extremity supported Standing balance-Leahy Scale: Zero Standing balance comment: 2 standing trials needing significant, but graded assist and hips and shoulders.  Cues improved pt's response, but pt still unable to focus on tasks to maintain good effort without constant cuing.                    Cognition Arousal/Alertness: Awake/alert Behavior During Therapy: Restless Overall Cognitive Status: Impaired/Different from baseline Area of Impairment: Orientation;Attention;Memory;Following commands;Safety/judgement;Awareness;Problem solving Orientation Level: Disoriented to;Place;Time Current Attention Level: Focused;Sustained Memory: Decreased short-term memory Following Commands: Follows one step commands inconsistently Safety/Judgement: Decreased awareness of safety;Decreased awareness of deficits   Problem Solving: Slow processing;Decreased initiation;Difficulty sequencing;Requires verbal cues;Requires tactile cues General Comments: Pt demonstrates focused to brief periods of sustained attention.   He quotes scripture inappropriate to conversation, and is unable to engage in basic conversation.   Confabulatory at times.   He follows one step commands with mod verbal and tactile cues     Exercises      General Comments        Pertinent Vitals/Pain Pain Assessment: Faces Pain Score: 0-No pain Faces Pain Scale: No hurt    Home Living Family/patient expects to be discharged to:: Skilled nursing facility Living Arrangements: Spouse/significant other Available Help at Discharge: Family;Available 24 hours/day Type of Home: House Home Access: Level entry   Home Layout: One level Home Equipment: None Additional Comments: wife can not physically assist  patient    Prior Function Level  of Independence: Independent      Comments: Per chart review, pt was primary caregiver for spouse    PT Goals (current goals can now be found in the care plan section) Acute Rehab PT Goals PT Goal Formulation: With family Time For Goal Achievement: 03/02/15 Potential to Achieve Goals: Fair Progress towards PT goals: Progressing toward goals    Frequency  Min 3X/week    PT Plan Current plan remains appropriate;Frequency needs to be updated    Co-evaluation             End of Session   Activity Tolerance: Patient tolerated treatment well;Patient limited by fatigue Patient left: in bed;with call bell/phone within reach;Other (comment);with bed alarm set     Time: [id number] PT Time Calculation (min) (ACUTE ONLY): 25 min  Charges:  $Therapeutic Activity: 8-22 mins                    G Codes:      Bellamie Turney, [name] 02/20/2015, 2:04 PM 02/20/2015  [place] BingKen Rebekkah Powless, PT [phone]  (pager)

## 2015-02-20 NOTE — Progress Notes (Signed)
Speech Language Pathology Treatment: Dysphagia  Patient Details Name: Austin Sawyer MRN: [id number] DOB: 11/22/1934 Today's Date: 02/20/2015 Time: [id number] SLP Time Calculation (min) (ACUTE ONLY): 15 min  Assessment / Plan / Recommendation Clinical Impression  SLP notified that family brought in upper dentures, therefore treatment focuse don diagnostic trials of advanced solids. With Mod cues for sustained attention and awareness of right-sided pocketing, pt was able to consume Dys 2 textures. No overt signs of difficulty seen with thin liquids. Will advance diet to Dys 2 and thin liquids with continued monitoring.   HPI Other Pertinent Information: [name] is a 80 y.o. male with history of HTN, DM, CAD, a flutter on Coumadin before until 2010, alcohol abuse admitted for headache, nausea vomiting, confusion, blurred vision. CT had showed left occipital ICH with small SAH, SDH and IVH. He was intubated 5/9-5/13.   Pertinent Vitals Pain Assessment: No/denies pain Pain Score: 0-No pain Faces Pain Scale: No hurt  SLP Plan  Continue with current plan of care    Recommendations Diet recommendations: Dysphagia 2 (fine chop);Thin liquid Liquids provided via: Cup;Straw Medication Administration: Crushed with puree Supervision: Patient able to self feed;Full supervision/cueing for compensatory strategies Compensations: Slow rate;Small sips/bites;Other (Comment) Postural Changes and/or Swallow Maneuvers: Seated upright 90 degrees;Upright 30-60 min after meal              Oral Care Recommendations: Oral care BID Follow up Recommendations: Skilled Nursing facility Plan: Continue with current plan of care    Austin Sawyer, M.A. CCC-SLP [phone]  Austin Sawyer, Austin Sawyer 02/20/2015, 4:53 PM

## 2015-02-20 NOTE — Progress Notes (Signed)
Pt arrived to 4N16 via bed.  Telemetry applied, call bell within reach.  Pt in no apparent distress, no c/o pain.  Will continue to monitor. Sondra ComeSilva, Hanad Leino M, RN

## 2015-02-21 DIAGNOSIS — M25569 Pain in unspecified knee: Secondary | ICD-10-CM | POA: Diagnosis not present

## 2015-02-21 DIAGNOSIS — J9811 Atelectasis: Secondary | ICD-10-CM | POA: Diagnosis not present

## 2015-02-21 DIAGNOSIS — I10 Essential (primary) hypertension: Secondary | ICD-10-CM | POA: Diagnosis not present

## 2015-02-21 DIAGNOSIS — K5900 Constipation, unspecified: Secondary | ICD-10-CM | POA: Diagnosis not present

## 2015-02-21 DIAGNOSIS — R451 Restlessness and agitation: Secondary | ICD-10-CM | POA: Diagnosis not present

## 2015-02-21 DIAGNOSIS — R1312 Dysphagia, oropharyngeal phase: Secondary | ICD-10-CM | POA: Diagnosis not present

## 2015-02-21 DIAGNOSIS — F332 Major depressive disorder, recurrent severe without psychotic features: Secondary | ICD-10-CM | POA: Diagnosis not present

## 2015-02-21 DIAGNOSIS — R278 Other lack of coordination: Secondary | ICD-10-CM | POA: Diagnosis not present

## 2015-02-21 DIAGNOSIS — I472 Ventricular tachycardia: Secondary | ICD-10-CM | POA: Diagnosis not present

## 2015-02-21 DIAGNOSIS — E119 Type 2 diabetes mellitus without complications: Secondary | ICD-10-CM | POA: Diagnosis not present

## 2015-02-21 DIAGNOSIS — M6281 Muscle weakness (generalized): Secondary | ICD-10-CM | POA: Diagnosis not present

## 2015-02-21 DIAGNOSIS — F0391 Unspecified dementia with behavioral disturbance: Secondary | ICD-10-CM | POA: Diagnosis not present

## 2015-02-21 DIAGNOSIS — I6789 Other cerebrovascular disease: Secondary | ICD-10-CM | POA: Diagnosis not present

## 2015-02-21 DIAGNOSIS — I509 Heart failure, unspecified: Secondary | ICD-10-CM | POA: Diagnosis not present

## 2015-02-21 DIAGNOSIS — I611 Nontraumatic intracerebral hemorrhage in hemisphere, cortical: Secondary | ICD-10-CM | POA: Diagnosis not present

## 2015-02-21 DIAGNOSIS — J9601 Acute respiratory failure with hypoxia: Secondary | ICD-10-CM | POA: Diagnosis not present

## 2015-02-21 DIAGNOSIS — G9340 Encephalopathy, unspecified: Secondary | ICD-10-CM | POA: Diagnosis not present

## 2015-02-21 DIAGNOSIS — R5381 Other malaise: Secondary | ICD-10-CM | POA: Diagnosis not present

## 2015-02-21 DIAGNOSIS — K2270 Barrett's esophagus without dysplasia: Secondary | ICD-10-CM | POA: Diagnosis not present

## 2015-02-21 DIAGNOSIS — G629 Polyneuropathy, unspecified: Secondary | ICD-10-CM | POA: Diagnosis not present

## 2015-02-21 DIAGNOSIS — D62 Acute posthemorrhagic anemia: Secondary | ICD-10-CM | POA: Diagnosis not present

## 2015-02-21 DIAGNOSIS — R41841 Cognitive communication deficit: Secondary | ICD-10-CM | POA: Diagnosis not present

## 2015-02-21 DIAGNOSIS — I610 Nontraumatic intracerebral hemorrhage in hemisphere, subcortical: Secondary | ICD-10-CM | POA: Diagnosis not present

## 2015-02-21 DIAGNOSIS — M25559 Pain in unspecified hip: Secondary | ICD-10-CM | POA: Diagnosis not present

## 2015-02-21 DIAGNOSIS — E785 Hyperlipidemia, unspecified: Secondary | ICD-10-CM | POA: Diagnosis not present

## 2015-02-21 DIAGNOSIS — I639 Cerebral infarction, unspecified: Secondary | ICD-10-CM | POA: Diagnosis not present

## 2015-02-21 DIAGNOSIS — D72829 Elevated white blood cell count, unspecified: Secondary | ICD-10-CM | POA: Diagnosis not present

## 2015-02-21 DIAGNOSIS — I471 Supraventricular tachycardia: Secondary | ICD-10-CM | POA: Diagnosis not present

## 2015-02-21 DIAGNOSIS — I5020 Unspecified systolic (congestive) heart failure: Secondary | ICD-10-CM | POA: Diagnosis not present

## 2015-02-21 DIAGNOSIS — I4892 Unspecified atrial flutter: Secondary | ICD-10-CM | POA: Diagnosis not present

## 2015-02-21 DIAGNOSIS — I619 Nontraumatic intracerebral hemorrhage, unspecified: Secondary | ICD-10-CM | POA: Diagnosis not present

## 2015-02-21 DIAGNOSIS — F329 Major depressive disorder, single episode, unspecified: Secondary | ICD-10-CM | POA: Diagnosis not present

## 2015-02-21 DIAGNOSIS — I259 Chronic ischemic heart disease, unspecified: Secondary | ICD-10-CM | POA: Diagnosis not present

## 2015-02-21 DIAGNOSIS — M79604 Pain in right leg: Secondary | ICD-10-CM | POA: Diagnosis not present

## 2015-02-21 DIAGNOSIS — M1000 Idiopathic gout, unspecified site: Secondary | ICD-10-CM | POA: Diagnosis not present

## 2015-02-21 LAB — CBC WITH DIFFERENTIAL/PLATELET
BASOS ABS: 0 10*3/uL (ref 0.0–0.1)
Basophils Relative: 0 % (ref 0–1)
EOS PCT: 2 % (ref 0–5)
Eosinophils Absolute: 0.3 10*3/uL (ref 0.0–0.7)
HEMATOCRIT: 38.2 % — AB (ref 39.0–52.0)
Hemoglobin: 12.3 g/dL — ABNORMAL LOW (ref 13.0–17.0)
LYMPHS PCT: 13 % (ref 12–46)
Lymphs Abs: 1.8 10*3/uL (ref 0.7–4.0)
MCH: 28.6 pg (ref 26.0–34.0)
MCHC: 32.2 g/dL (ref 30.0–36.0)
MCV: 88.8 fL (ref 78.0–100.0)
MONO ABS: 0.6 10*3/uL (ref 0.1–1.0)
MONOS PCT: 4 % (ref 3–12)
Neutro Abs: 11.7 10*3/uL — ABNORMAL HIGH (ref 1.7–7.7)
Neutrophils Relative %: 81 % — ABNORMAL HIGH (ref 43–77)
Platelets: 220 10*3/uL (ref 150–400)
RBC: 4.3 MIL/uL (ref 4.22–5.81)
RDW: 14.5 % (ref 11.5–15.5)
WBC: 14.4 10*3/uL — ABNORMAL HIGH (ref 4.0–10.5)

## 2015-02-21 LAB — BASIC METABOLIC PANEL
Anion gap: 7 (ref 5–15)
BUN: 13 mg/dL (ref 6–20)
CO2: 24 mmol/L (ref 22–32)
CREATININE: 1.08 mg/dL (ref 0.61–1.24)
Calcium: 8.4 mg/dL — ABNORMAL LOW (ref 8.9–10.3)
Chloride: 114 mmol/L — ABNORMAL HIGH (ref 101–111)
GFR calc Af Amer: >60 mL/min (ref >60)
GFR calc non Af Amer: >60 mL/min (ref >60)
Glucose, Bld: 119 mg/dL — ABNORMAL HIGH (ref 65–99)
Potassium: 3.5 mmol/L (ref 3.5–5.1)
Sodium: 145 mmol/L (ref 135–145)

## 2015-02-21 LAB — GLUCOSE, CAPILLARY
Glucose-Capillary: 105 mg/dL — ABNORMAL HIGH (ref 65–99)
Glucose-Capillary: 109 mg/dL — ABNORMAL HIGH (ref 65–99)

## 2015-02-21 MED ORDER — BISACODYL 10 MG RE SUPP: 10.0000 mg | Freq: Every day | RECTAL | Status: DC | PRN

## 2015-02-21 MED ORDER — CARVEDILOL 25 MG PO TABS: 25.0000 mg | ORAL_TABLET | Freq: Two times a day (BID) | ORAL | Status: DC

## 2015-02-21 MED ORDER — INSULIN ASPART 100 UNIT/ML ~~LOC~~ SOLN: 0.0000 [IU] | Freq: Three times a day (TID) | SUBCUTANEOUS | Status: DC

## 2015-02-21 MED ORDER — AMLODIPINE BESYLATE 5 MG PO TABS
5.0000 mg | ORAL_TABLET | Freq: Every day | ORAL | Status: DC
  Administered 2015-02-21: 5 mg via ORAL
  Filled 2015-02-21: qty 1

## 2015-02-21 MED ORDER — AMLODIPINE BESYLATE 5 MG PO TABS: 5.0000 mg | ORAL_TABLET | Freq: Every day | ORAL | Status: DC

## 2015-02-21 MED ORDER — SENNOSIDES-DOCUSATE SODIUM 8.6-50 MG PO TABS: 1.0000 | ORAL_TABLET | Freq: Two times a day (BID) | ORAL | Status: DC

## 2015-02-21 MED ORDER — ACETAMINOPHEN 325 MG PO TABS: 650.0000 mg | ORAL_TABLET | ORAL | Status: DC | PRN

## 2015-02-21 MED ORDER — ADULT MULTIVITAMIN W/MINERALS CH: 1.0000 | ORAL_TABLET | Freq: Every day | ORAL | Status: DC

## 2015-02-21 MED ORDER — HEPARIN SODIUM (PORCINE) 5000 UNIT/ML IJ SOLN: 5000.0000 [IU] | Freq: Three times a day (TID) | INTRAMUSCULAR | Status: DC

## 2015-02-21 MED ORDER — LISINOPRIL 20 MG PO TABS: 20.0000 mg | ORAL_TABLET | Freq: Two times a day (BID) | ORAL | Status: DC

## 2015-02-21 MED ORDER — QUETIAPINE FUMARATE 25 MG PO TABS: 25.0000 mg | ORAL_TABLET | Freq: Every day | ORAL | Status: AC

## 2015-02-21 MED ORDER — HYDRALAZINE HCL 50 MG PO TABS: 50.0000 mg | ORAL_TABLET | Freq: Three times a day (TID) | ORAL | Status: DC

## 2015-02-21 MED ORDER — POLYETHYLENE GLYCOL 3350 17 G PO PACK: 17.0000 g | PACK | Freq: Every day | ORAL | Status: DC

## 2015-02-21 MED ORDER — ATORVASTATIN CALCIUM 10 MG PO TABS: 10.0000 mg | ORAL_TABLET | Freq: Every day | ORAL | Status: DC

## 2015-02-21 MED ORDER — ALBUTEROL SULFATE (2.5 MG/3ML) 0.083% IN NEBU: 2.5000 mg | INHALATION_SOLUTION | Freq: Two times a day (BID) | RESPIRATORY_TRACT | Status: DC | PRN

## 2015-02-21 MED ORDER — DULOXETINE HCL 40 MG PO CPEP: 40.0000 mg | ORAL_CAPSULE | Freq: Every day | ORAL | Status: DC

## 2015-02-21 MED ORDER — ENSURE ENLIVE PO LIQD: 237.0000 mL | Freq: Two times a day (BID) | ORAL | Status: DC

## 2015-02-21 NOTE — Clinical Social Work Note (Signed)
[facility]. Facility to complete paperwork with family at bed. MD notified.   Derenda FennelBashira [place], MSW, LCSWA [phone] 02/21/2015 11:29 AM

## 2015-02-21 NOTE — Clinical Social Work Placement (Signed)
   CLINICAL SOCIAL WORK PLACEMENT  NOTE  Date:  02/21/2015  Patient Details  Name: Austin Sawyer MRN: [id number] Date of Birth: 07/28/1935  Clinical Social Work is seeking post-discharge placement for this patient at the Skilled  Nursing Facility level of care (*CSW will initial, date and re-position this form in  chart as items are completed):  Yes   Patient/family provided with [facility] Clinical Social Work Department's list of facilities offering this level of care within the geographic area requested by the patient (or if unable, by the patient's family).  Yes   Patient/family informed of their freedom to choose among providers that offer the needed level of care, that participate in Medicare, Medicaid or managed care program needed by the patient, have an available bed and are willing to accept the patient.  Yes   Patient/family informed of [facility]'s ownership interest in [facility] and [facility], as well as of the fact that they are under no obligation to receive care at these facilities.  PASRR submitted to EDS on 02/19/15     PASRR number received on       Existing PASRR number confirmed on       FL2 transmitted to all facilities in geographic area requested by pt/family on 02/18/15     FL2 transmitted to all facilities within larger geographic area on       Patient informed that his/her managed care company has contracts with or will negotiate with certain facilities, including the following:        Yes   Patient/family informed of bed offers received.  Patient chooses bed at  [facility])     Physician recommends and patient chooses bed at      Patient to be transferred to  [facility] ) on 02/21/15.  Patient to be transferred to facility by  [facility] )     Patient family notified on 02/21/15 of transfer.  Name of family member notified:   (Pt's dtr and wife )     PHYSICIAN Please sign FL2 (Please sign 30 day note  for [facility] completion)     Additional Comment:    _______________________________________________ Vaughan BrownerNixon, Rayli Wiederhold A, LCSW 02/21/2015, 3:19 PM

## 2015-02-21 NOTE — Progress Notes (Signed)
Speech Language Pathology Treatment: Dysphagia  Patient Details Name: Austin Sawyer MRN: [id number] DOB: 10/02/1935 Today's Date: 02/21/2015 Time: [id number] SLP Time Calculation (min) (ACUTE ONLY): 36 min  Assessment / Plan / Recommendation Clinical Impression  Skilled treatment session focused on dysphagia goals. Patient able to feed self from cup with straw, however demonstrated prolonged mastication and bolus formation with small bites of saltine cracker. SLP provided bites of applesauce to aid in clearing residuals, however solid texture residuals remained along gumline and upper denture. Patient unable cognitively to swish and spit with water. SLP cleared oral residuals with toothette sponge after PO's. No overt s/s aspiration observed during this session.   HPI Other Pertinent Information: Austin Sawyer is a 80 y.o. male with history of HTN, DM, CAD, a flutter on Coumadin before until 2010, alcohol abuse admitted for headache, nausea vomiting, confusion, blurred vision. CT had showed left occipital ICH with small SAH, SDH and IVH. He was intubated 5/9-5/13.   Pertinent Vitals Pain Assessment: No/denies pain Pain Score: 0-No pain  SLP Plan  Continue with current plan of care    Recommendations Diet recommendations: Dysphagia 2 (fine chop);Thin liquid Liquids provided via: Straw;Cup Medication Administration: Crushed with puree Supervision: Patient able to self feed;Full supervision/cueing for compensatory strategies Compensations: Slow rate;Small sips/bites;Other (Comment) (check for pocketing/oral residuals post swallows. Oral care after meals/PO's) Postural Changes and/or Swallow Maneuvers: Seated upright 90 degrees;Upright 30-60 min after meal              Oral Care Recommendations: Oral care BID Follow up Recommendations: Skilled Nursing facility Plan: Continue with current plan of care    GO     Pablo Lawrencereston, Nathifa Ritthaler Tarrell 02/21/2015, 3:31 PM   Angela NevinJohn T. Yon Schiffman, MA,  CCC-SLP 02/21/2015 3:34 PM

## 2015-02-21 NOTE — Clinical Social Work Note (Signed)
Clinical Social Worker facilitated patient discharge including contacting patient family and facility to confirm patient discharge plans.  Clinical information faxed to facility and family agreeable with plan.  CSW arranged [facility] .  RN to call report prior to discharge.  DC packet on chart for transport.  Clinical Social Worker will sign off for now as social work intervention is no longer needed. Please consult us again if new need arises.  Austin Sawyer, MSW, LCSWA [phone] 02/21/2015 3:19 PM

## 2015-02-21 NOTE — Discharge Summary (Signed)
Stroke Discharge Summary  Patient ID: Austin Sawyer   MRN: [id number]      DOB: 01-24-1935  Date of Admission: 02/08/2015 Date of Discharge: 02/21/2015  Attending Physician:  Marvel Plan, MD, Stroke MD  Consulting Physician(s):     cardiology, pulmonary/intensive care and rehabilitation medicine  Patient's PCP:  Jackie Plum, MD  DISCHARGE DIAGNOSIS: Left occipital ICH with small SAH, SDH and IVH Active Problems:   Hemorrhage of brain, nontraumatic   Stroke due to intracerebral hemorrhage   Acute respiratory failure with hypoxemia   Atelectasis   CHF (congestive heart failure)   Encounter for feeding tube placement   SVT (supraventricular tachycardia)   Cytotoxic brain edema   Encephalopathy acute   ICH (intracerebral hemorrhage)   HLD (hyperlipidemia)   Agitation   Accelerated hypertension  BMI: Body mass index is 33.04 kg/(m^2).  Past Medical History  Diagnosis Date  . Hypertension   . Coronary artery disease   . Diabetes mellitus without complication   . MI, old   . Reflux   . Depression   . Cervicalgia   . Disturbance of skin sensation   . Coronary atherosclerosis of unspecified type of vessel, native or graft   . Unspecified hearing loss   . Unspecified sleep apnea   . Alcohol abuse, in remission   . Atrial flutter    Past Surgical History  Procedure Laterality Date  . Hernia repair    . Rotator cuff repair        Medication List    STOP taking these medications        darifenacin 7.5 MG 24 hr tablet  Commonly known as:  ENABLEX     ezetimibe-simvastatin 10-80 MG per tablet  Commonly known as:  VYTORIN     hydrOXYzine 50 MG capsule  Commonly known as:  VISTARIL     losartan 100 MG tablet  Commonly known as:  COZAAR     metFORMIN 500 MG 24 hr tablet  Commonly known as:  GLUCOPHAGE-XR     mirtazapine 15 MG tablet  Commonly known as:  REMERON     solifenacin 5 MG tablet  Commonly known as:  VESICARE     tamsulosin 0.4 MG Caps capsule   Commonly known as:  FLOMAX     telmisartan-hydrochlorothiazide 80-25 MG per tablet  Commonly known as:  MICARDIS HCT     valsartan-hydrochlorothiazide 160-12.5 MG per tablet  Commonly known as:  DIOVAN-HCT      TAKE these medications        acetaminophen 325 MG tablet  Commonly known as:  TYLENOL  Take 2 tablets (650 mg total) by mouth every 4 (four) hours as needed for mild pain (or temp > 99 F).     albuterol (2.5 MG/3ML) 0.083% nebulizer solution  Commonly known as:  PROVENTIL  Take 3 mLs (2.5 mg total) by nebulization 2 (two) times daily as needed for wheezing or shortness of breath.     allopurinol 300 MG tablet  Commonly known as:  ZYLOPRIM  Take 300 mg by mouth daily.     amLODipine 5 MG tablet  Commonly known as:  NORVASC  Take 1 tablet (5 mg total) by mouth daily.     atorvastatin 10 MG tablet  Commonly known as:  LIPITOR  Take 1 tablet (10 mg total) by mouth daily at 6 PM.     bisacodyl 10 MG suppository  Commonly known as:  DULCOLAX  Place 1 suppository (10 mg total)  rectally daily as needed for moderate constipation.     carvedilol 25 MG tablet  Commonly known as:  COREG  Take 1 tablet (25 mg total) by mouth 2 (two) times daily with a meal.     colchicine 0.6 MG tablet  Take 1 tablet (0.6 mg total) by mouth daily.     DULoxetine HCl 40 MG Cpep  Take 40 mg by mouth daily.     feeding supplement (ENSURE ENLIVE) Liqd  Take 237 mLs by mouth 2 (two) times daily between meals.     gabapentin 300 MG capsule  Commonly known as:  NEURONTIN  Take 300 mg by mouth daily.     heparin 5000 UNIT/ML injection  Inject 1 mL (5,000 Units total) into the skin every 8 (eight) hours.     hydrALAZINE 50 MG tablet  Commonly known as:  APRESOLINE  Take 1 tablet (50 mg total) by mouth every 8 (eight) hours.     insulin aspart 100 UNIT/ML injection  Commonly known as:  novoLOG  Inject 0-15 Units into the skin 3 (three) times daily with meals.     lisinopril 20 MG  tablet  Commonly known as:  PRINIVIL,ZESTRIL  Take 1 tablet (20 mg total) by mouth 2 (two) times daily.     multivitamin with minerals Tabs tablet  Take 1 tablet by mouth daily.     nitroGLYCERIN 0.4 MG SL tablet  Commonly known as:  NITROSTAT  Place 0.4 mg under the tongue every 5 (five) minutes as needed. Chest pain     omeprazole 20 MG capsule  Commonly known as:  PRILOSEC  Take 20 mg by mouth daily.     polyethylene glycol packet  Commonly known as:  MIRALAX / GLYCOLAX  Take 17 g by mouth daily.     QUEtiapine 25 MG tablet  Commonly known as:  SEROQUEL  Take 1 tablet (25 mg total) by mouth at bedtime.     senna-docusate 8.6-50 MG per tablet  Commonly known as:  Senokot-S  Take 1 tablet by mouth 2 (two) times daily.        LABORATORY STUDIES CBC    Component Value Date/Time   WBC 14.4* 02/21/2015 0524   RBC 4.30 02/21/2015 0524   HGB 12.3* 02/21/2015 0524   HCT 38.2* 02/21/2015 0524   PLT 220 02/21/2015 0524   MCV 88.8 02/21/2015 0524   MCH 28.6 02/21/2015 0524   MCHC 32.2 02/21/2015 0524   RDW 14.5 02/21/2015 0524   LYMPHSABS 1.8 02/21/2015 0524   MONOABS 0.6 02/21/2015 0524   EOSABS 0.3 02/21/2015 0524   BASOSABS 0.0 02/21/2015 0524   CMP    Component Value Date/Time   NA 145 02/21/2015 0524   K 3.5 02/21/2015 0524   CL 114* 02/21/2015 0524   CO2 24 02/21/2015 0524   GLUCOSE 119* 02/21/2015 0524   BUN 13 02/21/2015 0524   CREATININE 1.08 02/21/2015 0524   CALCIUM 8.4* 02/21/2015 0524   PROT 6.0* 02/13/2015 0320   ALBUMIN 2.3* 02/13/2015 0320   AST 24 02/13/2015 0320   ALT 31 02/13/2015 0320   ALKPHOS 44 02/13/2015 0320   BILITOT 0.5 02/13/2015 0320   GFRNONAA >60 02/21/2015 0524   GFRAA >60 02/21/2015 0524   COAGS Lab Results  Component Value Date   INR 1.07 02/08/2015   INR 2.6 06/12/2010   INR 3.6 05/29/2010   Lipid Panel    Component Value Date/Time   CHOL 178 02/09/2015 0745   TRIG 121  02/12/2015 1141   HDL 62 02/09/2015 0745    CHOLHDL 2.9 02/09/2015 0745   VLDL 9 02/09/2015 0745   LDLCALC 107* 02/09/2015 0745   HgbA1C  Lab Results  Component Value Date   HGBA1C 6.0* 02/09/2015   Cardiac Panel (last 3 results) No results for input(s): CKTOTAL, CKMB, TROPONINI, RELINDX in the last 72 hours. Urinalysis    Component Value Date/Time   COLORURINE YELLOW 02/08/2015 1823   APPEARANCEUR CLEAR 02/08/2015 1823   LABSPEC 1.016 02/08/2015 1823   PHURINE 6.5 02/08/2015 1823   GLUCOSEU NEGATIVE 02/08/2015 1823   HGBUR NEGATIVE 02/08/2015 1823   BILIRUBINUR NEGATIVE 02/08/2015 1823   KETONESUR NEGATIVE 02/08/2015 1823   PROTEINUR 100* 02/08/2015 1823   UROBILINOGEN 0.2 02/08/2015 1823   NITRITE NEGATIVE 02/08/2015 1823   LEUKOCYTESUR NEGATIVE 02/08/2015 1823   Urine Drug Screen     Component Value Date/Time   LABOPIA NONE DETECTED 02/08/2015 1823   COCAINSCRNUR NONE DETECTED 02/08/2015 1823   LABBENZ NONE DETECTED 02/08/2015 1823   AMPHETMU NONE DETECTED 02/08/2015 1823   THCU NONE DETECTED 02/08/2015 1823   LABBARB NONE DETECTED 02/08/2015 1823    Alcohol Level    Component Value Date/Time   ETH <5 02/08/2015 1826     SIGNIFICANT DIAGNOSTIC STUDIES     Ct Head Wo Contrast 02/16/2015 - Stable subarachnoid hemorrhage seen overlying right parietal cortex. Stable large left occipital intraparenchymal hemorrhage is noted. with surrounding white matter edema resulting in approximately 6 mL. of left to right midline shift. Stable left-sided subdural hematoma is noted. Stable bilateral lateral intraventricular hemorrhage is noted without ventricular dilatation.  02/10/2015 - 1. No significant interval change in size of left occipital lobe intraparenchymal hematoma with slightly increased associated vasogenic edema as compared to previous. Similar trace left-to-right shift. 2. Stable to slightly decreased conspicuity of bilateral small volume subarachnoid hemorrhage. 3. Stable intraventricular hemorrhage. No  hydrocephalus. 4. Persistent left subdural hematoma measuring up to 3.5 mm in maximal diameter. Blood along the left tentorium is decreased in conspicuity. 5. No new intracranial abnormality.  02/09/2015 IMPRESSION: 1. Slight enlargement of large left occipital lobe intra-axial hemorrhage. Estimated hemorrhage volume now 58 mm. Stable left posterior hemisphere edema and mass effect. 2. Mild progression of subarachnoid extension, now bilateral. Extension and of a left subdural space with small broad-based left subdural hematoma not significantly changed. Stable intraventricular hemorrhage volume. No ventriculomegaly. 3. No new intracranial abnormality identified.   02/08/2015 IMPRESSION: 1. 6.7 cm left occipital parenchymal hemorrhage with mild surrounding edema. No midline shift. 2. Small amount of adjacent subarachnoid hemorrhage and small amount of intraventricular extension of blood. 3. Small subdural hematomas over the left cerebral convexity and left tentorium.   Dg Chest Port 1 View 02/13/15 1. Stable support apparatus. 2. Improved basilar atelectasis. 02/10/15 Tube and catheter positions as described without pneumothorax. No edema or consolidation. 02/09/2015 IMPRESSION: No acute cardiopulmonary disease.   Carotid Doppler No evidence of significant stenosis in the right or left ICA. The left side was technically challenged due to line placement.   2D Echocardiogram Left ventricle: The cavity size was normal. Wall thickness was increased in a pattern of moderate LVH. Systolic function was normal. The estimated ejection fraction was in the range of 55% to 60%. Doppler parameters are consistent with elevated ventricular end-diastolic filling pressure. - Aortic valve: Poorly seen Moderately calcified mild stenosis by gradients but may be underestimated due to poor CW angle. Valve area (VTI): 2.11 cm^2. Valve area (Vmax): 2.01 cm^2. Valve area (  Vmean): 1.88 cm^2. -  Mitral valve: There was mild regurgitation. - Left atrium: The atrium was moderately dilated. - Impressions: Cannot r/o SOE due to extremely poor image quality. Impressions: - Cannot r/o SOE due to extremely poor image quality.  MRI and MRA  7.3 x 3.3 cm left occipital hematoma unchanged from the prior CT. There is intraventricular hemorrhage and subarachnoid hemorrhage also unchanged. There is mass-effect on the left occipital horn and 3 mm midline shift to the right. No evidence of underlying tumor or vascular malformation. This may be related to cerebral amyloid angiography. Hypertension also possible. Negative MRA circle Willis.  EEG This EEG is abnormal with moderately severe generalized continuous nonspecific slowing of cerebral activity. No evidence of epileptic activity was recorded. The absence of epileptiform activity during an EEG recording does not, in and of itself, rule out seizure disorder, however.  EKG NSR   HISTORY OF PRESENT ILLNESS Alphonse Asbridge is a 80 y.o. male with a past medical history significant for HTN, DM type 2, CAD, MI, atrial flutter, alcohol abuse in remission, who initially presented to [facility] ED with complaints of severe HA, nausea, vision blurriness, confusion, and trouble finding words. Symptoms apparently started when patient was eating. Patient's wife reported to the ED attending that he was acting normal at 4:30 pm. Code stroke was called, CT brain was obtained, personally reviewed, and demonstrated a 6.7 cm left occipital parenchymal hemorrhage with mild surrounding edema, no midline shift or intraventricular extension. Further, there is small amount of adjacent subarachnoid hemorrhage and small amountof intraventricular extension of blood as well as small subdural hematomas over the left cerebral convexity and left tentorium. The patient denies recent fall or head trauma. Complains of HA and nausea but denies vertigo, double vision, focal weakness or  numbness, slurred speech, or language impairment. Patient is not taking anticoagulants, INR 1.07, PTT 27 Platelets 166.  Hypertensive with BP 187/91 mmHg in the ED  LSN: 4:30 PM 02/08/15 tPA Given: no, ICH  HOSPITAL COURSE Mr. Trenten Watchman is a 80 y.o. male with history of HTN, DM, CAD, a flutter on Coumadin before until 2010, alcohol abuse admitted for headache, nausea vomiting, confusion, blurred vision. CT had showed left occipital ICH with small SAH, SDH and IVH. Symptoms and changed and neuro stable. Clinically stable and much improved.  Left occipital ICH with small SAH, SDH and IVH - etiology unclear, HTN versus CAA   MRI Stable ICH, SDH and IVH - no new bleeding and minimal midline shift   MRA unremarkable   Repeat CT showed stable hematoma with slight extension of SAH and SDH, stable IVH  Carotid Doppler unremarkable  2D Echo EF 55-60%  EEG no seizure  LDL 107, not at goal  HgbA1c 6.0  heparin subq for VTE prophylaxis  DIET - DYS 1 Room service appropriate?: Yes; Fluid consistency:: Thin   no antithrombotic prior to admission, now on no antithrombotic due to ICH  Ongoing aggressive stroke risk factor management  Therapy recommendations: SNF  Disposition: Transfer to floor  Hyponatremia/hypernatremia  Concerning for SIADH vs. salt wasting initially  Sodium from 143-> 131 -> 138 -> 145->154->159->156 ->152->162->152->147  Off 3% saline  Na goal normalization now  Off IVF now  Encourage po fluid intake  Diabetes  HbA1c 6.0, goal < 7.0  Controlled  CBG monitoring  SSI  Hypertension  Home meds: Coreg, losartan  BP goal < 160  continue hydralazine  tid and coreg  Add lisinopril  Not well controlled  Close monitoring  Hyperlipidemia  Home meds: Vytorin  LDL 107, goal < 70  Add lipitor 10mg   Continue statin at discharge  A. flutter  Patient history of a flutter  Was on Coumadin in the past  no  anticoagulation or antiplatelet PTA  No a flutter on telemetry  Patient may not be a candidate for anticoagulation  Agitation / dementia  Symptoms with sundowning  Add seroquel Qhs  Wake-sleep cycle adjustment  Other Stroke Risk Factors  Advanced age  ETOH use  Obesity, Body mass index is 33.04 kg/(m^2).  Other Active Problems  Mild hypokalemia  Leukocytosis  DISCHARGE EXAM  Blood pressure 173/82, pulse 65, temperature 98.7 F (37.1 C), temperature source Oral, resp. rate 18, height 5\' 6"  (1.676 m), weight 92.8 kg (204 lb 9.4 oz), SpO2 93 %. Temp: [98.6 F (37 C)-100.5 F (38.1 C)] 99.2 F (37.3 C) (05/19 0828) Pulse Rate: [60-99] 69 (05/19 1000) Resp: [11-35] 18 (05/19 1000) BP: (89-202)/(53-120) 135/59 mmHg (05/19 1000) SpO2: [96 %-100 %] 99 % (05/19 1000) Weight: [204 lb 9.4 oz (92.8 kg)] 204 lb 9.4 oz (92.8 kg) (05/19 0500)  General - Well nourished, well developed, no acute distress.  Ophthalmologic - fundi not visualized due to incorporation.  Cardiovascular - Regular rate and rhythm.  Mental Status -  Level of arousal and orientation to place were intact and person, but not orientated to time or situation. Language including expression, repetition are intact, able to follow simple commands, but not able to name and repeat. Moderate perseveration.   Cranial Nerves II - XII - II - blinking to visual threat bilaterally. III, IV, VI - PERRL, EOMI V - Facial sensation intact bilaterally. VII - Facial movement intact bilaterally. VIII - Hard of hearing & vestibular intact bilaterally. X - Palate elevates symmetrically. XI - Chin turning & shoulder shrug intact bilaterally. XII - Tongue protrusion intact.  Motor Strength - The patient's strength was normal in all extremities and pronator drift was absent. Bulk was normal and fasciculations were absent.  Motor Tone - Muscle tone was assessed at the neck and appendages and was normal.  Reflexes  - The patient's reflexes were 1+ in all extremities and he had no pathological reflexes.  Sensory - Light touch, temperature/pinprick were assessed and were symmetrical.   Coordination - not cooperative on exam. Tremor was absent.  Gait and Station - not tested.  Discharge Diet   DIET DYS 2 Room service appropriate?: Yes; Fluid consistency:: Thin liquids  DISCHARGE PLAN  Disposition:  Skilled nursing facility  no antithrombotic for secondary stroke prevention.secondary to hemorrhage.  Follow-up OSEI-BONSU,GEORGE, MD in 2 weeks.  Follow-up with Dr. Marvel [facility] in 2 months.   40 minutes were spent preparing discharge.   Delton See PA-C [facility] Pager [phone] 02/21/2015, 3:01 PM   I, the attending vascular neurologist, have personally obtained a history, examined the patient, evaluated laboratory data, individually viewed imaging studies and agree with radiology interpretations.  Together with the NP/PA, we formulated the assessment and plan of care which reflects our mutual decision.  I have made any additions or clarifications directly to the above note and agree with the findings and plan as currently documented.    Marvel Plan, MD PhD Stroke Neurology 02/21/2015 6:29 PM

## 2015-02-21 NOTE — Progress Notes (Addendum)
PROGRESS NOTE  Candise CheLeon Nobles ZOX:[id number] DOB: 03/28/1935 DOA: 02/08/2015 PCP: Jackie PlumSEI-BONSU,GEORGE, MD  Assessment/Plan: HTN- started norvasc for elevated diastolic, BP meds can be increased at SNF -on coreg/hydralazine  Na- WNL -encourage PO intake  leukocytosis -stable -encourage incentive spirometry -s/p unasyn  ICH - improvement with seroquel  Stable for d/c to SNF- [facility]    Code Status: full Family Communication: patient and family Disposition Plan: SNF   HPI/Subjective: Singing, said he has had enough apple sauce  Objective: Filed Vitals:   02/21/15 1021  BP: 153/73  Pulse: 63  Temp: 97.9 F (36.6 C)  Resp: 16   No intake or output data in the 24 hours ending 02/21/15 1141 Filed Weights   02/18/15 0429 02/19/15 0358 02/20/15 0500  Weight: 92.9 kg (204 lb 12.9 oz) 95 kg (209 lb 7 oz) 92.8 kg (204 lb 9.4 oz)    Exam:   General:  pleasant  Cardiovascular: rrr  Respiratory: clear  Abdomen: +BS, soft  Musculoskeletal: no edema   Data Reviewed: Basic Metabolic Panel:  Recent Labs Lab 02/17/15 1013 02/18/15 0427 02/19/15 0505 02/19/15 1911 02/20/15 0250 02/21/15 0524  NA 162* 162* 152* 148* 147* 145  K 3.3* 3.8 3.4* 3.5 3.9 3.5  CL 122* 124* 118* 115* 116* 114*  CO2 27 28 26 24 23 24   GLUCOSE 97 100* 132* 116* 114* 119*  BUN 35* 35* 26* 22* 20 13  CREATININE 1.52* 1.40* 1.31* 1.26* 1.22 1.08  CALCIUM 8.3* 8.2* 7.9* 8.3* 7.9* 8.4*  MG 2.7* 2.8* 2.8* 2.3 2.3  --   PHOS  --  4.9* 4.0  --  3.0  --    Liver Function Tests: No results for input(s): AST, ALT, ALKPHOS, BILITOT, PROT, ALBUMIN in the last 168 hours. No results for input(s): LIPASE, AMYLASE in the last 168 hours. No results for input(s): AMMONIA in the last 168 hours. CBC:  Recent Labs Lab 02/16/15 0553 02/18/15 0427 02/19/15 0505 02/20/15 0250 02/21/15 0524  WBC 22.2* 13.4* 11.7* 14.3* 14.4*  NEUTROABS  --  10.7* 9.4* 12.4* 11.7*  HGB 13.7 11.7*  11.8* 11.5* 12.3*  HCT 42.5 38.1* 37.0* 36.4* 38.2*  MCV 89.5 91.8 90.7 89.2 88.8  PLT 205 175 176 179 220   Cardiac Enzymes: No results for input(s): CKTOTAL, CKMB, CKMBINDEX, TROPONINI in the last 168 hours. BNP (last 3 results) No results for input(s): BNP in the last 8760 hours.  ProBNP (last 3 results) No results for input(s): PROBNP in the last 8760 hours.  CBG:  Recent Labs Lab 02/20/15 0828 02/20/15 1218 02/20/15 1610 02/20/15 2213 02/21/15 0635  GLUCAP 119* 103* 109* 126* 105*    Recent Results (from the past 240 hour(s))  Culture, Urine     Status: None   Collection Time: 02/15/15  2:50 PM  Result Value Ref Range Status   Specimen Description URINE, CATHETERIZED  Final   Special Requests Normal  Final   Colony Count NO GROWTH [facility]formed at [facility]   Final   Culture NO GROWTH Performed at [facility]   Final   Report Status 02/16/2015 FINAL  Final     Studies: No results found.  Scheduled Meds: . allopurinol  300 mg Oral Daily  . amLODipine  5 mg Oral Daily  . antiseptic oral rinse  7 mL Mouth Rinse QID  . atorvastatin  10 mg Oral q1800  . carvedilol  25 mg Oral BID WC  . chlorhexidine  15  mL Mouth Rinse BID  . colchicine  0.6 mg Oral Daily  . DULoxetine  40 mg Oral Daily  . feeding supplement (ENSURE ENLIVE)  237 mL Oral BID BM  . gabapentin  300 mg Oral Daily  . heparin subcutaneous  5,000 Units Subcutaneous 3 times per day  . hydrALAZINE  50 mg Oral 3 times per day  . insulin aspart  0-15 Units Subcutaneous TID WC  . lisinopril  20 mg Oral BID  . multivitamin with minerals  1 tablet Oral Daily  . pantoprazole  40 mg Oral Daily  . polyethylene glycol  17 g Oral Daily  . QUEtiapine  25 mg Oral QHS  . senna-docusate  1 tablet Oral BID   Continuous Infusions:  Antibiotics Given (last 72 hours)    None      Active Problems:   Hemorrhage of brain, nontraumatic   Stroke due to intracerebral hemorrhage   Acute  respiratory failure with hypoxemia   Atelectasis   CHF (congestive heart failure)   Encounter for feeding tube placement   SVT (supraventricular tachycardia)   Cytotoxic brain edema   Encephalopathy acute   ICH (intracerebral hemorrhage)   HLD (hyperlipidemia)   Agitation   Accelerated hypertension    Time spent: 25 min    Ellean Firman  [facility] Pager [phone]. If 7PM-7AM, please contact night-coverage at [email or web address], password [email or web address] 02/21/2015, 11:41 AM  LOS: 13 days

## 2015-02-21 NOTE — Progress Notes (Signed)
[facility] arrived [facility] Report called during day shift by RN. Belongings sent with patient. Monia PouchShakenna Kadeshia Kasparian, RN

## 2015-02-21 NOTE — Progress Notes (Signed)
Patient is being d/c to a nursing home. Report called to the receiving nurse Joni ReiningNicole.

## 2015-02-21 NOTE — Progress Notes (Signed)
Writer called nursing facility twice to give report but not successful.

## 2015-02-24 ENCOUNTER — Encounter: Payer: Self-pay | Admitting: Adult Health

## 2015-02-24 ENCOUNTER — Non-Acute Institutional Stay (SKILLED_NURSING_FACILITY): Payer: Medicare Other | Admitting: Adult Health

## 2015-02-24 DIAGNOSIS — I10 Essential (primary) hypertension: Secondary | ICD-10-CM

## 2015-02-24 DIAGNOSIS — F332 Major depressive disorder, recurrent severe without psychotic features: Secondary | ICD-10-CM | POA: Diagnosis not present

## 2015-02-24 DIAGNOSIS — E119 Type 2 diabetes mellitus without complications: Secondary | ICD-10-CM | POA: Diagnosis not present

## 2015-02-24 DIAGNOSIS — E785 Hyperlipidemia, unspecified: Secondary | ICD-10-CM | POA: Diagnosis not present

## 2015-02-24 DIAGNOSIS — K5900 Constipation, unspecified: Secondary | ICD-10-CM

## 2015-02-24 DIAGNOSIS — I611 Nontraumatic intracerebral hemorrhage in hemisphere, cortical: Secondary | ICD-10-CM | POA: Diagnosis not present

## 2015-02-24 DIAGNOSIS — G629 Polyneuropathy, unspecified: Secondary | ICD-10-CM

## 2015-02-24 DIAGNOSIS — F0391 Unspecified dementia with behavioral disturbance: Secondary | ICD-10-CM | POA: Diagnosis not present

## 2015-02-24 DIAGNOSIS — M1000 Idiopathic gout, unspecified site: Secondary | ICD-10-CM | POA: Diagnosis not present

## 2015-02-24 DIAGNOSIS — F03918 Unspecified dementia, unspecified severity, with other behavioral disturbance: Secondary | ICD-10-CM

## 2015-02-24 DIAGNOSIS — I4892 Unspecified atrial flutter: Secondary | ICD-10-CM

## 2015-02-24 DIAGNOSIS — D72829 Elevated white blood cell count, unspecified: Secondary | ICD-10-CM

## 2015-02-25 ENCOUNTER — Non-Acute Institutional Stay (SKILLED_NURSING_FACILITY): Payer: Medicare Other | Admitting: Internal Medicine

## 2015-02-25 DIAGNOSIS — D72829 Elevated white blood cell count, unspecified: Secondary | ICD-10-CM

## 2015-02-25 DIAGNOSIS — R5381 Other malaise: Secondary | ICD-10-CM

## 2015-02-25 DIAGNOSIS — F332 Major depressive disorder, recurrent severe without psychotic features: Secondary | ICD-10-CM

## 2015-02-25 DIAGNOSIS — D62 Acute posthemorrhagic anemia: Secondary | ICD-10-CM

## 2015-02-25 DIAGNOSIS — K5900 Constipation, unspecified: Secondary | ICD-10-CM | POA: Diagnosis not present

## 2015-02-25 DIAGNOSIS — I69191 Dysphagia following nontraumatic intracerebral hemorrhage: Secondary | ICD-10-CM

## 2015-02-25 DIAGNOSIS — I5020 Unspecified systolic (congestive) heart failure: Secondary | ICD-10-CM | POA: Diagnosis not present

## 2015-02-25 DIAGNOSIS — K2270 Barrett's esophagus without dysplasia: Secondary | ICD-10-CM | POA: Diagnosis not present

## 2015-02-25 DIAGNOSIS — E118 Type 2 diabetes mellitus with unspecified complications: Secondary | ICD-10-CM

## 2015-02-25 DIAGNOSIS — I10 Essential (primary) hypertension: Secondary | ICD-10-CM | POA: Diagnosis not present

## 2015-02-25 DIAGNOSIS — E785 Hyperlipidemia, unspecified: Secondary | ICD-10-CM | POA: Diagnosis not present

## 2015-02-25 DIAGNOSIS — I4892 Unspecified atrial flutter: Secondary | ICD-10-CM | POA: Diagnosis not present

## 2015-02-25 DIAGNOSIS — I611 Nontraumatic intracerebral hemorrhage in hemisphere, cortical: Secondary | ICD-10-CM

## 2015-02-25 DIAGNOSIS — M79604 Pain in right leg: Secondary | ICD-10-CM

## 2015-02-25 NOTE — Progress Notes (Signed)
Patient ID: Austin Sawyer, male   DOB: 1935-06-29, 80 y.o.   MRN: [id number]     [facility]   PCP: OSEI-BONSU,GEORGE, MD  Code Status: full code  No Known Allergies  Chief Complaint  Patient presents with  . New Admit To SNF     HPI:  80 year old patient is here for short term rehabilitation post hospital admission from 02/08/15-02/21/15 with left occipital intracranial hemorrhage with small SAH, SDH and IVH. repeat CT showed stable hematoma with slight extension of SAH and SDH, stable IVH. Carotid Doppler was unremarkable. 2D Echo showed EF 55-60%. EEG was negative for seizure. His ldl was noted to be 107, a1c was 6. He was not given any thrombolytic with his hemorrhage. SLP evaluated the patient and started him on dysphagia diet. He was started on lipitor 10 mg daily and lisinopril was added to his bp medications. He has pmh of HTN, HLD, atrial flutter. He is seen in his room today. His wife, daughter and son in law are present in the room. He has hearing impairment and has hearing amplifier. He is able to say few words but unable to obtain HPI or ROS from him. Family has noticed him to be holding his right hip and knee in flexed position and are concerned. He complaints of pain while trying to straighten it out. No falls or trauma in facility per staff. No fever or chills reported. He had a bowel movement on Sunday.    Review of Systems:  Unable to obtain   Past Medical History  Diagnosis Date  . Hypertension   . Coronary artery disease   . Diabetes mellitus without complication   . MI, old   . Reflux   . Depression   . Cervicalgia   . Disturbance of skin sensation   . Coronary atherosclerosis of unspecified type of vessel, native or graft   . Unspecified hearing loss   . Unspecified sleep apnea   . Alcohol abuse, in remission   . Atrial flutter    Past Surgical History  Procedure Laterality Date  . Hernia repair    . Rotator cuff repair       Social History:   reports that he has quit smoking. He has never used smokeless tobacco. He reports that he does not drink alcohol or use illicit drugs.  Family History  Problem Relation Age of Onset  . Anxiety disorder Mother   . Cancer Mother     Medications: Patient's Medications  New Prescriptions   No medications on file  Previous Medications   ACETAMINOPHEN (TYLENOL) 325 MG TABLET    Take 2 tablets (650 mg total) by mouth every 4 (four) hours as needed for mild pain (or temp > 99 F).   ALBUTEROL (PROVENTIL) (2.5 MG/3ML) 0.083% NEBULIZER SOLUTION    Take 3 mLs (2.5 mg total) by nebulization 2 (two) times daily as needed for wheezing or shortness of breath.   ALLOPURINOL (ZYLOPRIM) 300 MG TABLET    Take 300 mg by mouth daily.   AMLODIPINE (NORVASC) 5 MG TABLET    Take 1 tablet (5 mg total) by mouth daily.   ATORVASTATIN (LIPITOR) 10 MG TABLET    Take 1 tablet (10 mg total) by mouth daily at 6 PM.   BISACODYL (DULCOLAX) 10 MG SUPPOSITORY    Place 1 suppository (10 mg total) rectally daily as needed for moderate constipation.   CARVEDILOL (COREG) 25 MG TABLET    Take 1  tablet (25 mg total) by mouth 2 (two) times daily with a meal.   COLCHICINE 0.6 MG TABLET    Take 1 tablet (0.6 mg total) by mouth daily.   DULOXETINE 40 MG CPEP    Take 40 mg by mouth daily.   FEEDING SUPPLEMENT, ENSURE ENLIVE, (ENSURE ENLIVE) LIQD    Take 237 mLs by mouth 2 (two) times daily between meals.   GABAPENTIN (NEURONTIN) 300 MG CAPSULE    Take 300 mg by mouth daily.   HEPARIN 5000 UNIT/ML INJECTION    Inject 1 mL (5,000 Units total) into the skin every 8 (eight) hours.   HYDRALAZINE (APRESOLINE) 50 MG TABLET    Take 1 tablet (50 mg total) by mouth every 8 (eight) hours.   INSULIN ASPART (NOVOLOG) 100 UNIT/ML INJECTION    Inject 0-15 Units into the skin 3 (three) times daily with meals.   LISINOPRIL (PRINIVIL,ZESTRIL) 20 MG TABLET    Take 1 tablet (20 mg total) by mouth 2 (two) times daily.   MULTIPLE  VITAMIN (MULTIVITAMIN WITH MINERALS) TABS TABLET    Take 1 tablet by mouth daily.   NITROGLYCERIN (NITROSTAT) 0.4 MG SL TABLET    Place 0.4 mg under the tongue every 5 (five) minutes as needed. Chest pain   OMEPRAZOLE (PRILOSEC) 20 MG CAPSULE    Take 20 mg by mouth daily.   POLYETHYLENE GLYCOL (MIRALAX / GLYCOLAX) PACKET    Take 17 g by mouth daily.   QUETIAPINE (SEROQUEL) 25 MG TABLET    Take 1 tablet (25 mg total) by mouth at bedtime.   SENNA-DOCUSATE (SENOKOT-S) 8.6-50 MG PER TABLET    Take 1 tablet by mouth 2 (two) times daily.  Modified Medications   No medications on file  Discontinued Medications   No medications on file     Physical Exam: Filed Vitals:   02/25/15 1043  BP: 127/65  Pulse: 77  Temp: 97.6 F (36.4 C)  Resp: 18  SpO2: 97%    General- elderly obese male in no acute distress Head- normocephalic, atraumatic Throat- moist mucus membrane  Eyes- no pallor, no icterus, no discharge, normal conjunctiva, normal sclera Neck- no cervical lymphadenopathy, no jugular vein distension Cardiovascular- normal s1,s2, no murmurs/ rubs/ gallops, dorsalis pedis and radial pulses, leg edema Respiratory- bilateral clear to auscultation, no wheeze, no rhonchi, no crackles, no use of accessory muscles Abdomen- bowel sounds present, soft, non tender Musculoskeletal- able to move all 4 extremities, right knee mild swelling but no warmth or erythema. Left knee normal. Spasticity noted in right leg. no spinal and paraspinal tenderness Neurological- unable to assess with pt not following commands Skin- warm and dry   Labs reviewed: Basic Metabolic Panel:  Recent Labs  16/10/96 0427 02/19/15 0505 02/19/15 1911 02/20/15 0250 02/21/15 0524  NA 162* 152* 148* 147* 145  K 3.8 3.4* 3.5 3.9 3.5  CL 124* 118* 115* 116* 114*  CO2 GLUCOSE 100* 132* 116* 114* 119*  BUN 35* 26* 22* 20 13  CREATININE 1.40* 1.31* 1.26* 1.22 1.08  CALCIUM 8.2* 7.9* 8.3* 7.9* 8.4*  MG  2.8* 2.8* 2.3 2.3  --   PHOS 4.9* 4.0  --  3.0  --    Liver Function Tests:  Recent Labs  02/08/15 1823 02/12/15 0420 02/13/15 0320  AST ALT ALKPHOS 54 44 44  BILITOT 0.6 0.5 0.5  PROT 6.8 5.8* 6.0*  ALBUMIN 3.7 2.4* 2.3*  No results for input(s): LIPASE, AMYLASE in the last 8760 hours. No results for input(s): AMMONIA in the last 8760 hours. CBC:  Recent Labs  02/19/15 0505 02/20/15 0250 02/21/15 0524  WBC 11.7* 14.3* 14.4*  NEUTROABS 9.4* 12.4* 11.7*  HGB 11.8* 11.5* 12.3*  HCT 37.0* 36.4* 38.2*  MCV 90.7 89.2 88.8  PLT 176 179 220   Cardiac Enzymes:  Recent Labs  02/11/15 2345 02/12/15 0420 02/12/15 1000  TROPONINI 0.10* 0.06* <0.03   BNP: Invalid input(s): POCBNP CBG:  Recent Labs  02/20/15 2213 02/21/15 0635 02/21/15 1151  GLUCAP 126* 105* 109*    Radiological Exams: Ct Head Wo Contrast 02/16/2015 - Stable subarachnoid hemorrhage seen overlying right parietal cortex. Stable large left occipital intraparenchymal hemorrhage is noted. with surrounding white matter edema resulting in approximately 6 mL. of left to right midline shift. Stable left-sided subdural hematoma is noted. Stable bilateral lateral intraventricular hemorrhage is noted without ventricular dilatation.   02/10/2015 - 1. No significant interval change in size of left occipital lobe intraparenchymal hematoma with slightly increased associated vasogenic edema as compared to previous. Similar trace left-to-right shift. 2. Stable to slightly decreased conspicuity of bilateral small volume subarachnoid hemorrhage. 3. Stable intraventricular hemorrhage.  No hydrocephalus. 4. Persistent left subdural hematoma measuring up to 3.5 mm in maximal diameter. Blood along the left tentorium is decreased in conspicuity. 5. No new intracranial abnormality.  02/09/2015   IMPRESSION: 1. Slight enlargement of large left occipital lobe intra-axial hemorrhage. Estimated hemorrhage  volume now 58 mm. Stable left posterior hemisphere edema and mass effect. 2. Mild progression of subarachnoid extension, now bilateral. Extension and of a left subdural space with small broad-based left subdural hematoma not significantly changed. Stable intraventricular hemorrhage volume. No ventriculomegaly. 3. No new intracranial abnormality identified.      02/08/2015   IMPRESSION: 1. 6.7 cm left occipital parenchymal hemorrhage with mild surrounding edema. No midline shift. 2. Small amount of adjacent subarachnoid hemorrhage and small amount of intraventricular extension of blood. 3. Small subdural hematomas over the left cerebral convexity and left tentorium.   Dg Chest Port 1 View 02/13/15 1. Stable support apparatus. 2. Improved basilar atelectasis. 02/10/15 Tube and catheter positions as described without pneumothorax. No edema or consolidation. 02/09/2015    IMPRESSION: No acute cardiopulmonary disease.     Carotid Doppler  No evidence of significant stenosis in the right or left ICA. The left side was technically challenged due to line placement.    2D Echocardiogram  Left ventricle: The cavity size was normal. Wall thickness was   increased in a pattern of moderate LVH. Systolic function was   normal. The estimated ejection fraction was in the range of 55%   to 60%. Doppler parameters are consistent with elevated   ventricular end-diastolic filling pressure. - Aortic valve: Poorly seen Moderately calcified mild stenosis by   gradients but may be underestimated due to poor CW angle. Valve   area (VTI): 2.11 cm^2. Valve area (Vmax): 2.01 cm^2. Valve area   (Vmean): 1.88 cm^2. - Mitral valve: There was mild regurgitation. - Left atrium: The atrium was moderately dilated. - Impressions: Cannot r/o SOE due to extremely poor image quality. Impressions: - Cannot r/o SOE due to extremely poor image quality.  MRI and MRA  7.3 x 3.3 cm left occipital hematoma unchanged from the prior CT. There  is intraventricular hemorrhage and subarachnoid hemorrhage also unchanged. There is mass-effect on the left occipital horn and 3 mm midline shift to the right. No  evidence of underlying tumor or vascular malformation. This may be related to cerebral amyloid angiography. Hypertension also possible. Negative MRA circle Willis.  EEG This EEG is abnormal with moderately severe generalized continuous nonspecific slowing of cerebral activity. No evidence of epileptic activity was recorded. The absence of epileptiform activity during an EEG recording does not, in and of itself, rule out seizure disorder, however.  EKG  NSR   Assessment/Plan  Physical deconditioning Will have him work with physical therapy and occupational therapy team to help with gait training and muscle strengthening exercises.fall precautions. Skin care. Encourage to be out of bed.   ICH  Monitor neurologically, has f/u with neurology. Continue seroquel 25 mg daily for behavioral issues.   Diabetes mellitus, type 2  hemoglobin A1c 6.0. Continue Humalog SSI and monitor cbg  Atrial flutter Rate controlled. On heparin [place] tid for now, not a candidate for coumadin with recent intra-cerebral bleed  Leukocytosis Monitor wbc count, afebrile at present  Blood loss anemia From intra cerebral bleed, monitor h&h  Right leg pain Start robaxin 500 mg bid for muscle tightness and get xray of right hip and knee and pelvis to rule out fracture/ dislocation. Continue neurontin 300 mg daily  dysphagia Continue puree diet with thin liquids, aspiration precautions  Constipation Continue senna s bid and miralax for now  barret's esophagus Continue prilosec 20 mg daily  CHF Continue b blocker, lisinopril and hydralazine, monitor clinically  Hypertension Stable, continue Coreg 25 mg bid, lisinopril 20 mg daily, hydralazine 50 mg tid, noirvasc 5 mg daily and clonidine 0.1 mg tid prn, monitor bp  Hyperlipidemia continue Lipitor  10 mg daily    Depression continue Cymbalta 40 mg daily   Goals of care: short term rehabilitation   Labs/tests ordered: cbc, cmp  Family/ staff Communication: reviewed care plan with patient and nursing supervisor    Oneal GroutMAHIMA Ahmad Vanwey, MD  [facility] [phone] (Monday-Friday 8 am - 5 pm) [phone] (afterhours)

## 2015-03-02 NOTE — Progress Notes (Signed)
Patient ID: Austin Sawyer, male   DOB: 1935-03-30, 80 y.o.   MRN: [id number]   02/24/15  Facility:  Nursing Home Location:  [facility] Room Number: 1202-P LEVEL OF CARE:  SNF (31)    Chief Complaint  Patient presents with  . Hospitalization Follow-up    ICH, diabetes mellitus, hypertension, hyperlipidemia, atrial flutter, dementia, leukocytosis, gout, depression, neuropathy and constipation    HISTORY OF PRESENT ILLNESS:  This is a 80 year old male who was been admitted to [facility] on 02/21/15 from [facility]. He has PMH of hypertension, CAD, diabetes mellitus, old MI, reflux, depression, atrial flutter and history of alcohol abuse. He presented to the ED with complaints of severe headache, nausea, vision blurriness, confusion and trouble finding words. CT obtained showed a 6.7 cm left occipital parenchymal hemorrhage and small amount of intraventricular extension of blood as well as a small subdural hematomas overdone left cerebral convexity and left tentorium.  Patient is noted to to be hard of hearing and needs an amplifier. He is confused and disoriented.  He has been admitted for a short-term rehabilitation.   PAST MEDICAL HISTORY:  Past Medical History  Diagnosis Date  . Hypertension   . Coronary artery disease   . Diabetes mellitus without complication   . MI, old   . Reflux   . Depression   . Cervicalgia   . Disturbance of skin sensation   . Coronary atherosclerosis of unspecified type of vessel, native or graft   . Unspecified hearing loss   . Unspecified sleep apnea   . Alcohol abuse, in remission   . Atrial flutter     CURRENT MEDICATIONS: Reviewed per MAR/see medication list  No Known Allergies   REVIEW OF SYSTEMS:  Unable to obtain due to confusion   PHYSICAL EXAMINATION  GENERAL: no acute distress, obese EYES: conjunctivae normal, sclerae normal, normal eye lids NECK: supple, trachea midline, no neck masses, no  thyroid tenderness, no thyromegaly LYMPHATICS: no LAN in the neck, no supraclavicular LAN RESPIRATORY: breathing is even & unlabored, BS CTAB CARDIAC: RRR, no murmur,no extra heart sounds, no edema GI: abdomen soft, normal BS, no masses, no tenderness, no hepatomegaly, no splenomegaly EXTREMITIES: He is able to move all 4 extremities PSYCHIATRIC: the patient is alert & oriented to person, affect & behavior appropriate  LABS/RADIOLOGY: Labs reviewed: Basic Metabolic Panel:  Recent Labs  40/98/11 0427 02/19/15 0505 02/19/15 1911 02/20/15 0250 02/21/15 0524  NA 162* 152* 148* 147* 145  K 3.8 3.4* 3.5 3.9 3.5  CL 124* 118* 115* 116* 114*  CO2 GLUCOSE 100* 132* 116* 114* 119*  BUN 35* 26* 22* 20 13  CREATININE 1.40* 1.31* 1.26* 1.22 1.08  CALCIUM 8.2* 7.9* 8.3* 7.9* 8.4*  MG 2.8* 2.8* 2.3 2.3  --   PHOS 4.9* 4.0  --  3.0  --    Liver Function Tests:  Recent Labs  02/08/15 1823 02/12/15 0420 02/13/15 0320  AST ALT ALKPHOS 54 44 44  BILITOT 0.6 0.5 0.5  PROT 6.8 5.8* 6.0*  ALBUMIN 3.7 2.4* 2.3*   CBC:  Recent Labs  02/19/15 0505 02/20/15 0250 02/21/15 0524  WBC 11.7* 14.3* 14.4*  NEUTROABS 9.4* 12.4* 11.7*  HGB 11.8* 11.5* 12.3*  HCT 37.0* 36.4* 38.2*  MCV 90.7 89.2 88.8  PLT 176 179 220   Lipid Panel:  Recent Labs  02/09/15 0745  HDL 62  Cardiac Enzymes:  Recent Labs  02/11/15 2345 02/12/15 0420 02/12/15 1000  TROPONINI 0.10* 0.06* <0.03   CBG:  Recent Labs  02/20/15 2213 02/21/15 0635 02/21/15 1151  GLUCAP 126* 105* 109*      Ct Head Wo Contrast  02/16/2015   CLINICAL DATA:  Follow-up intracranial hemorrhage.  EXAM: CT HEAD WITHOUT CONTRAST  TECHNIQUE: Contiguous axial images were obtained from the base of the skull through the vertex without intravenous contrast.  COMPARISON:  CT scan of Feb 10, 2015.  FINDINGS: Bony calvarium appears intact. Stable large left occipital hemorrhage is noted compared  to prior exam with surrounding edema. Stable bilateral lateral intraventricular hemorrhage is noted without evidence of ventricular dilatation. Stable subarachnoid hemorrhage is seen over right parietal cortex. Stable left-sided subdural hematoma is noted. There remains approximately 6 mm of left-to-right midline shift. Mild diffuse cortical atrophy is noted. Mild chronic ischemic white matter disease is noted.  IMPRESSION: Stable subarachnoid hemorrhage seen overlying right parietal cortex.  Stable large left occipital intraparenchymal hemorrhage is noted with surrounding white matter edema resulting in approximately 6 mL of left to right midline shift.  Stable left-sided subdural hematoma is noted.  Stable bilateral lateral intraventricular hemorrhage is noted without ventricular dilatation.   Electronically Signed   By: Lupita RaiderJames  Green Jr, M.D.   On: 02/16/2015 07:45   Ct Head Wo Contrast  02/10/2015   CLINICAL DATA:  Follow-up exam for intracranial hematoma.  EXAM: CT HEAD WITHOUT CONTRAST  TECHNIQUE: Contiguous axial images were obtained from the base of the skull through the vertex without intravenous contrast.  COMPARISON:  Prior CT from 02/09/2015.  FINDINGS: Large left occipital lobe intra-axial hemorrhage again seen. This measures approximately 6.6 x 3.4 cm on today's study, overall not significantly changed. Associated vasogenic edema slightly increased. Previously seen small amount of subdural extension along the left tentorium is less prominent as compared to prior study. There remains a subdural hematoma measuring up to 3.5 mm in maximal thickness overlying the left temporal region. This is similar. Additionally, scattered bilateral subarachnoid hemorrhage again noted, slightly decreased in prevalence. This has redistributed more cephalad as compared to prior. Intraventricular extension with small amount of blood again seen in the occipital horns, overall similar to previous. No hydrocephalus. Trace  rightward shift is stable. Basilar cisterns remain patent. Fourth ventricle patent.  No new large vessel territory infarct.  Scalp soft tissues unremarkable. Orbits within normal limits. Paranasal sinuses remain clear. No mastoid effusion. Calvarium intact.  IMPRESSION: 1. No significant interval change in size of left occipital lobe intraparenchymal hematoma with slightly increased associated vasogenic edema as compared to previous. Similar trace left-to-right shift. 2. Stable to slightly decreased conspicuity of bilateral small volume subarachnoid hemorrhage. 3. Stable intraventricular hemorrhage.  No hydrocephalus. 4. Persistent left subdural hematoma measuring up to 3.5 mm in maximal diameter. Blood along the left tentorium is decreased in conspicuity. 5. No new intracranial abnormality.   Electronically Signed   By: Rise MuBenjamin  McClintock M.D.   On: 02/10/2015 06:58   Ct Head Wo Contrast  02/09/2015   CLINICAL DATA:  80 year old male with left occipital intra-axial hemorrhage detected on CT following code stroke presentation. Initial encounter.  EXAM: CT HEAD WITHOUT CONTRAST  TECHNIQUE: Contiguous axial images were obtained from the base of the skull through the vertex without intravenous contrast.  COMPARISON:  Head CT 02/08/2015.  FINDINGS: Stable and largely clear paranasal sinuses and mastoids. No acute osseous abnormality identified. Visualized orbits and scalp soft tissues are within normal  limits. Calcified atherosclerosis at the skull base.  Large left occipital lobe hyperdense intra-axial hemorrhage re- identified encompassing 71 x 36 mm AP by transverse (versus 68 x 36 mm at a comparable level previously). Estimated intra-axial hemorrhage volume of 58 mL.  Intraventricular and extra-axial extension Re identified. Broad-based left subdural hematoma measures up to 3 mm in thickness, not significantly changed. Some redistribution now with blood layering on the left tentorium. Small volume bilateral  posterior superior convexity subarachnoid hemorrhage has increased. Intraventricular hemorrhage volume has Re distributed but not significantly changed, now with hemorrhage layering in both occipital horns. No third or fourth ventricular hemorrhage.  Basilar cisterns remain patent. No ventriculomegaly. No new cortically based infarct. Posterior left hemisphere edema and regional mass effect is stable. Trace rightward midline shift.  IMPRESSION: 1. Slight enlargement of large left occipital lobe intra-axial hemorrhage. Estimated hemorrhage volume now 58 mm. Stable left posterior hemisphere edema and mass effect. 2. Mild progression of subarachnoid extension, now bilateral. Extension and of a left subdural space with small broad-based left subdural hematoma not significantly changed. Stable intraventricular hemorrhage volume. No ventriculomegaly. 3. No new intracranial abnormality identified.   Electronically Signed   By: Odessa Fleming M.D.   On: 02/09/2015 10:41   Ct Head Wo Contrast  02/08/2015   CLINICAL DATA:  Code stroke. Nausea and headache. Word-finding problems.  EXAM: CT HEAD WITHOUT CONTRAST  TECHNIQUE: Contiguous axial images were obtained from the base of the skull through the vertex without intravenous contrast.  COMPARISON:  Brain MRI 10/25/2004  FINDINGS: There is a large acute lobar parenchymal hemorrhage in the left occipital lobe measuring 6.7 x 3.2 cm (estimated volume 48 cc). There is mild surrounding edema with mass effect on the left lateral ventricle. There is a small amount of subarachnoid hemorrhage in adjacent left parieto-occipital sulci. There is a subdural hematoma extending over the left cerebral convexity measuring up to 5 mm in thickness. There is a small amount of intraventricular blood in the atrium of the left lateral ventricle. A small amount of subdural hematoma is also present along the left tentorium. There is mild regional sulcal effacement associated with the parenchymal hematoma  without midline shift.  There is no hydrocephalus. There is no evidence of acute large territory infarct separate from the parenchymal hematoma. Periventricular white-matter hypodensities are nonspecific but compatible with mild chronic small vessel ischemic disease.  Orbits are unremarkable. The visualized paranasal sinuses and mastoid air cells are clear. No skull fracture is seen.  IMPRESSION: 1. 6.7 cm left occipital parenchymal hemorrhage with mild surrounding edema. No midline shift. 2. Small amount of adjacent subarachnoid hemorrhage and small amount of intraventricular extension of blood. 3. Small subdural hematomas over the left cerebral convexity and left tentorium. Critical Value/emergent results were called by telephone at the time of interpretation on 02/08/2015 at 6:40 pm to Dr. Pricilla Loveless , who verbally acknowledged these results.   Electronically Signed   By: Sebastian Ache   On: 02/08/2015 18:41   Mr Maxine Glenn Head Wo Contrast  02/11/2015   CLINICAL DATA:  Intracranial hemorrhage hypertension and diabetes.  EXAM: MRI HEAD WITHOUT CONTRAST  MRA HEAD WITHOUT CONTRAST  TECHNIQUE: Multiplanar, multiecho pulse sequences of the brain and surrounding structures were obtained without intravenous contrast. Angiographic images of the head were obtained using MRA technique without contrast.  COMPARISON:  CT head 02/10/2015  FINDINGS: MRI HEAD FINDINGS  Left occipital hematoma measures 7.3 x 3.3 cm similar to the CT. Mild intraventricular hemorrhage is present  with hematocrit level in the occipital horn bilaterally. Mass-effect on the left occipital horn due to the hematoma. Mild subarachnoid hemorrhage bilaterally. Subarachnoid hemorrhage causes hyperintense signal on diffusion but I do not believe there is acute ischemic infarction present.  Ventricle size normal. 3 mm midline shift to the right due to mass-effect. No underlying neoplasm on unenhanced images.  Mucosal edema in the paranasal sinuses bilaterally  without air-fluid level. Mild mucosal edema mastoid sinus bilaterally.  MRA HEAD FINDINGS  Both vertebral arteries patent to the basilar. Left PICA patent. Right PICA not visualized. Basilar widely patent. Bilateral AICA patent. Bilateral superior cerebellar and posterior cerebral arteries widely patent  Internal carotid artery normal bilaterally. Anterior and middle cerebral arteries normal bilaterally  Negative for aneurysm or vascular malformation.  IMPRESSION: 7.3 x 3.3 cm left occipital hematoma unchanged from the prior CT. There is intraventricular hemorrhage and subarachnoid hemorrhage also unchanged. There is mass-effect on the left occipital horn and 3 mm midline shift to the right. No evidence of underlying tumor or vascular malformation. This may be related to cerebral amyloid angiography. Hypertension also possible.  Negative MRA circle Willis.   Electronically Signed   By: Marlan Palau M.D.   On: 02/11/2015 19:09   Mr Brain Wo Contrast  02/11/2015   CLINICAL DATA:  Intracranial hemorrhage hypertension and diabetes.  EXAM: MRI HEAD WITHOUT CONTRAST  MRA HEAD WITHOUT CONTRAST  TECHNIQUE: Multiplanar, multiecho pulse sequences of the brain and surrounding structures were obtained without intravenous contrast. Angiographic images of the head were obtained using MRA technique without contrast.  COMPARISON:  CT head 02/10/2015  FINDINGS: MRI HEAD FINDINGS  Left occipital hematoma measures 7.3 x 3.3 cm similar to the CT. Mild intraventricular hemorrhage is present with hematocrit level in the occipital horn bilaterally. Mass-effect on the left occipital horn due to the hematoma. Mild subarachnoid hemorrhage bilaterally. Subarachnoid hemorrhage causes hyperintense signal on diffusion but I do not believe there is acute ischemic infarction present.  Ventricle size normal. 3 mm midline shift to the right due to mass-effect. No underlying neoplasm on unenhanced images.  Mucosal edema in the paranasal sinuses  bilaterally without air-fluid level. Mild mucosal edema mastoid sinus bilaterally.  MRA HEAD FINDINGS  Both vertebral arteries patent to the basilar. Left PICA patent. Right PICA not visualized. Basilar widely patent. Bilateral AICA patent. Bilateral superior cerebellar and posterior cerebral arteries widely patent  Internal carotid artery normal bilaterally. Anterior and middle cerebral arteries normal bilaterally  Negative for aneurysm or vascular malformation.  IMPRESSION: 7.3 x 3.3 cm left occipital hematoma unchanged from the prior CT. There is intraventricular hemorrhage and subarachnoid hemorrhage also unchanged. There is mass-effect on the left occipital horn and 3 mm midline shift to the right. No evidence of underlying tumor or vascular malformation. This may be related to cerebral amyloid angiography. Hypertension also possible.  Negative MRA circle Willis.   Electronically Signed   By: Marlan Palau M.D.   On: 02/11/2015 19:09   Dg Chest Port 1 View  02/16/2015   CLINICAL DATA:  Short of breath  EXAM: PORTABLE CHEST - 1 VIEW  COMPARISON:  02/14/2015  FINDINGS: Interval removal of NG tube and endotracheal to. Left central venous line is unchanged. Stable cardiac silhouette ectatic aorta. No pulmonary edema or infiltrate.  IMPRESSION: Central venous catheter appears in good position.  Extubation without increase in atelectasis.   Electronically Signed   By: Genevive Bi M.D.   On: 02/16/2015 17:39   Dg Chest Port 1  View  02/14/2015   CLINICAL DATA:  Intracranial hemorrhage.  EXAM: PORTABLE CHEST - 1 VIEW  COMPARISON:  02/13/2015.  FINDINGS: Endotracheal tube, left IJ line, NG tube in stable position. Stable cardiomegaly with normal pulmonary vascularity. Bibasilar subsegmental atelectasis. No pleural effusion or pneumothorax .  IMPRESSION: 1. Lines and tubes in stable position. 2. Stable cardiomegaly. 3. Bibasilar subsegmental atelectasis with partial clearing.   Electronically Signed   By:  Maisie Fus  Register   On: 02/14/2015 07:31   Dg Chest Port 1 View  02/13/2015   CLINICAL DATA:  Atelectasis  EXAM: PORTABLE CHEST - 1 VIEW  COMPARISON:  Radiograph 02/12/2015  FINDINGS: Endotracheal tube and NG tube are unchanged. Stable cardiac silhouette with ectatic aorta. Left central venous line is unchanged. There is left basilar atelectasis slightly improved. No pneumothorax.  IMPRESSION: 1. Stable support apparatus. 2. Improved basilar atelectasis.   Electronically Signed   By: Genevive Bi M.D.   On: 02/13/2015 08:21   Dg Chest Port 1 View  02/12/2015   CLINICAL DATA:  Atelectasis.  EXAM: PORTABLE CHEST - 1 VIEW  COMPARISON:  One-view chest 02/11/2015  FINDINGS: Endotracheal tube, left IJ line, and NG tube are stable. The heart size is normal. Bibasilar airspace disease persist, left greater than right. Pulmonary vascular congestion is improved. A left pleural effusion is suspected.  IMPRESSION: 1. Support apparatus is stable. 2. Decreased pulmonary vascular congestion. 3. Persistent bibasilar airspace disease, left greater than right.   Electronically Signed   By: Marin Roberts M.D.   On: 02/12/2015 08:08   Dg Chest Port 1 View  02/11/2015   CLINICAL DATA:  Check endotracheal tube placement  EXAM: PORTABLE CHEST - 1 VIEW  COMPARISON:  02/10/2015  FINDINGS: Cardiac shadow is stable. A left-sided jugular line, nasogastric catheter and endotracheal tube are all noted in satisfactory position. No pneumothorax is identified. No focal confluent infiltrate is seen. No bony abnormality is noted  IMPRESSION: Tubes and lines as described.  No acute abnormality noted.   Electronically Signed   By: Alcide Clever M.D.   On: 02/11/2015 08:06   Dg Chest Port 1 View  02/10/2015   CLINICAL DATA:  Hypoxia  EXAM: PORTABLE CHEST - 1 VIEW  COMPARISON:  Feb 10, 2015  FINDINGS: Endotracheal tube tip is 3.8 cm above the carina. Central catheter tip is in the superior vena cava. No pneumothorax. There is stable  elevation of the left hemidiaphragm. There is no edema or consolidation. Heart is upper normal in size with pulmonary vascularity within normal limits. There is atherosclerotic change in the aorta.  IMPRESSION: Tube and catheter positions as described without pneumothorax. No edema or consolidation.   Electronically Signed   By: Bretta Bang III M.D.   On: 02/10/2015 10:53   Dg Chest Port 1 View  02/10/2015   CLINICAL DATA:  CHF  EXAM: PORTABLE CHEST - 1 VIEW  COMPARISON:  02/09/2015  FINDINGS: Borderline cardiomegaly. There is upper mediastinal widening which is likely from patient positioning. Mild pulmonary venous congestion without edema, accentuated by low volumes and positioning. No evidence for pneumonia, effusion, or pneumothorax.  IMPRESSION: Stable low volume chest.  No pulmonary edema.   Electronically Signed   By: Marnee Spring M.D.   On: 02/10/2015 09:57   Dg Chest Port 1 View  02/09/2015   CLINICAL DATA:  Congestive heart failure.  EXAM: PORTABLE CHEST - 1 VIEW  COMPARISON:  11/20/2014  FINDINGS: Cardiac silhouette is mildly enlarged. No mediastinal or hilar  masses. Clear lungs. No pleural effusion pneumothorax. Bony thorax is grossly intact.  IMPRESSION: No acute cardiopulmonary disease.   Electronically Signed   By: Amie Portland M.D.   On: 02/09/2015 10:08   Dg Abd Portable 1v  02/15/2015   CLINICAL DATA:  Feeding tube placement  EXAM: PORTABLE ABDOMEN - 1 VIEW  COMPARISON:  02/10/2015  FINDINGS: NG tube is been removed. No feeding tubes are visualized. Gas-filled colon, small bowel, and stomach are noted.  IMPRESSION: A feeding tube is not visualized in the abdomen.   Electronically Signed   By: Jolaine Click M.D.   On: 02/15/2015 12:41   Dg Abd Portable 1v  02/10/2015   CLINICAL DATA:  Feeding tube placement  EXAM: PORTABLE ABDOMEN - 1 VIEW  COMPARISON:  No recent similar comparison exam  FINDINGS: Nasogastric tube tip terminates over the expected location of the distal stomach.  Mild gaseous gastric distention. A few prominent loops of small bowel project over the left mid abdomen measuring 3.2 cm maximally. Presence or absence of air-fluid levels or free air is suboptimally evaluated on this supine projection.  IMPRESSION: Nasogastric tube tip terminates over the expected location of the distal stomach.   Electronically Signed   By: Christiana Pellant M.D.   On: 02/10/2015 13:28    ASSESSMENT/PLAN:  ICH - follow-up with DR Roda Shutters, neurology; for rehabilitation Diabetes mellitus, type 2 - hemoglobin A1c 6.0; continue Humalog sliding scale 3 times a day Hypertension - well controlled; continue Coreg 25 mg by mouth twice a day, clonidine 0.1 mg by mouth 3 times a day when necessary, lisinopril 20 mg by mouth twice a day, hydralazine 50 mg 3 times a day and Norvasc 5 mg by mouth daily Hyperlipidemia - continue Lipitor 10 mg by mouth daily  Atrial flutter - continue heparin 5000 units subcutaneous every 8 hours. He was on Coumadin before and patient is currently not a candidate for anticoagulation Dementia with agitation - continue Seroquel 25 mg by mouth daily at bedtime Leukocytosis - wbc 14.4; will monitor Gout - continue allopurinol 300 mg by mouth daily and call she seems 0.6 mg by mouth daily Depression - continue Cymbalta 40 mg by mouth daily Neuropathy - continue Neurontin 300 mg by mouth daily Constipation - continue MiraLAX 17 g by mouth daily and senna S1 tab by mouth twice a day    Goals of care:  Short-term rehabilitation   Labs/test ordered:  CBC and BMP  Spent 50 minutes in patient care.    [facility], NP [employer] [phone]

## 2015-04-11 ENCOUNTER — Telehealth: Payer: Self-pay | Admitting: Diagnostic Neuroimaging

## 2015-04-11 NOTE — Telephone Encounter (Signed)
Called by nurse re: patient's heparin 5000units [place] q8 hours. Advised to continue until follow up appt in stroke clinic with Dr. Roda ShuttersXu. -VRP

## 2015-04-15 ENCOUNTER — Encounter: Payer: Self-pay | Admitting: Adult Health

## 2015-04-15 ENCOUNTER — Non-Acute Institutional Stay (SKILLED_NURSING_FACILITY): Payer: Medicare Other | Admitting: Adult Health

## 2015-04-15 DIAGNOSIS — E785 Hyperlipidemia, unspecified: Secondary | ICD-10-CM

## 2015-04-15 DIAGNOSIS — I611 Nontraumatic intracerebral hemorrhage in hemisphere, cortical: Secondary | ICD-10-CM

## 2015-04-15 DIAGNOSIS — F0391 Unspecified dementia with behavioral disturbance: Secondary | ICD-10-CM

## 2015-04-15 DIAGNOSIS — I10 Essential (primary) hypertension: Secondary | ICD-10-CM

## 2015-04-15 DIAGNOSIS — I4892 Unspecified atrial flutter: Secondary | ICD-10-CM | POA: Diagnosis not present

## 2015-04-15 DIAGNOSIS — F332 Major depressive disorder, recurrent severe without psychotic features: Secondary | ICD-10-CM

## 2015-04-15 DIAGNOSIS — G629 Polyneuropathy, unspecified: Secondary | ICD-10-CM | POA: Diagnosis not present

## 2015-04-15 DIAGNOSIS — F03918 Unspecified dementia, unspecified severity, with other behavioral disturbance: Secondary | ICD-10-CM

## 2015-04-15 DIAGNOSIS — M1000 Idiopathic gout, unspecified site: Secondary | ICD-10-CM | POA: Diagnosis not present

## 2015-04-15 DIAGNOSIS — K5900 Constipation, unspecified: Secondary | ICD-10-CM | POA: Diagnosis not present

## 2015-04-15 DIAGNOSIS — E119 Type 2 diabetes mellitus without complications: Secondary | ICD-10-CM

## 2015-04-15 NOTE — Progress Notes (Signed)
Patient ID: Austin Sawyer, male   DOB: 08/31/1935, 80 y.o.   MRN: [id number]   04/15/15  Facility:  Nursing Home Location:  [facility] Room Number: 1202-P LEVEL OF CARE:  SNF (31)   Chief Complaint  Patient presents with  . Discharge Note    ICH, diabetes mellitus, hypertension, hyperlipidemia, atrial flutter, dementia, gout, depression, neuropathy and constipation    HISTORY OF PRESENT ILLNESS:  This is a 80 year old male who is for discharge hoe with Home health PT, OT, ST and Nursing. He has been admitted to [facility] 02/21/15 [facility]. He has PMH of hypertension, CAD, diabetes mellitus, old MI, reflux, depression, atrial flutter and history of alcohol abuse. He presented to the ED with complaints of severe headache, nausea, vision blurriness, confusion and trouble finding words. CT obtained showed a 6.7 cm left occipital parenchymal hemorrhage and small amount of intraventricular extension of blood as well as a small subdural hematomas overdone left cerebral convexity and left tentorium.  He has been ambulating with steady gait. He is oriented to person and disoriented to time and place.   Patient was admitted to this facility for short-term rehabilitation after the patient's recent hospitalization.  Patient has completed SNF rehabilitation and therapy has cleared the patient for discharge.  PAST MEDICAL HISTORY:  Past Medical History  Diagnosis Date  . Hypertension   . Coronary artery disease   . Diabetes mellitus without complication   . MI, old   . Reflux   . Depression   . Cervicalgia   . Disturbance of skin sensation   . Coronary atherosclerosis of unspecified type of vessel, native or graft   . Unspecified hearing loss   . Unspecified sleep apnea   . Alcohol abuse, in remission   . Atrial flutter     CURRENT MEDICATIONS: Reviewed per MAR/see medication list  No Known Allergies   REVIEW OF SYSTEMS:    GENERAL: no  change in appetite, no fatigue, no weight changes, no fever, chills or weakness RESPIRATORY: no cough, SOB, DOE, wheezing, hemoptysis CARDIAC: no chest pain, edema or palpitations GI: no abdominal pain, diarrhea, constipation, heart burn, nausea or vomiting   PHYSICAL EXAMINATION  GENERAL: no acute distress, obese EYES: conjunctivae normal, sclerae normal, normal eye lids NECK: supple, trachea midline, no neck masses, no thyroid tenderness, no thyromegaly LYMPHATICS: no LAN in the neck, no supraclavicular LAN RESPIRATORY: breathing is even & unlabored, BS CTAB CARDIAC: RRR, no murmur,no extra heart sounds, no edema GI: abdomen soft, normal BS, no masses, no tenderness, no hepatomegaly, no splenomegaly EXTREMITIES: He is able to move all 4 extremities; ambulatory PSYCHIATRIC: the patient is alert & oriented to person, disoriented to time and place, affect & behavior appropriate  LABS/RADIOLOGY: Labs reviewed: 03/26/15  sodium 142 potassium 4.1 glucose 90 BUN 10 creatinine 0.94 calcium 8.6 02/25/15  sodium 140 potassium 3.8 glucose 93 BUN 15 creatinine 0.99 calcium 8.4 uric acid 3.7 Right knee x-ray shows no definite radiographic evidence of acute fracture or dislocation; no joint effusion; mild osteoporosis and osteoarthritis Basic Metabolic Panel:  Recent Labs  40/98/1105/17/16 0427 02/19/15 0505 02/19/15 1911 02/20/15 0250 02/21/15 0524  NA 162* 152* 148* 147* 145  K 3.8 3.4* 3.5 3.9 3.5  CL 124* 118* 115* 116* 114*  CO2 28 26 24 23 24   GLUCOSE 100* 132* 116* 114* 119*  BUN 35* 26* 22* 20 13  CREATININE 1.40* 1.31* 1.26* 1.22 1.08  CALCIUM 8.2* 7.9*  8.3* 7.9* 8.4*  MG 2.8* 2.8* 2.3 2.3  --   PHOS 4.9* 4.0  --  3.0  --    Liver Function Tests:  Recent Labs  02/08/15 1823 02/12/15 0420 02/13/15 0320  AST ALT ALKPHOS 54 44 44  BILITOT 0.6 0.5 0.5  PROT 6.8 5.8* 6.0*  ALBUMIN 3.7 2.4* 2.3*   CBC:  Recent Labs  02/19/15 0505 02/20/15 0250  02/21/15 0524  WBC 11.7* 14.3* 14.4*  NEUTROABS 9.4* 12.4* 11.7*  HGB 11.8* 11.5* 12.3*  HCT 37.0* 36.4* 38.2*  MCV 90.7 89.2 88.8  PLT 176 179 220   Lipid Panel:  Recent Labs  02/09/15 0745  HDL 62   Cardiac Enzymes:  Recent Labs  02/11/15 2345 02/12/15 0420 02/12/15 1000  TROPONINI 0.10* 0.06* <0.03   CBG:  Recent Labs  02/20/15 2213 02/21/15 0635 02/21/15 1151  GLUCAP 126* 105* 109*      No results found.  ASSESSMENT/PLAN:  ICH - follow-up with DR Roda Shutters, neurology; for Home health PT, OT, ST and Nursing Diabetes mellitus, type 2 - hemoglobin A1c 6.0; continue Humalog sliding scale 3 times a day Hypertension - well controlled; continue Coreg 25 mg by mouth twice a day, clonidine 0.1 mg by mouth 3 times a day when necessary, lisinopril 20 mg by mouth twice a day, hydralazine 50 mg 3 times a day and Norvasc 5 mg by mouth daily Hyperlipidemia - continue Lipitor 10 mg by mouth daily  Atrial flutter - he was on Coumadin before and patient is currently not a candidate for anticoagulation; heparin discontinued since he is ambulatory Dementia with agitation - continue Seroquel 25 mg by mouth daily at bedtime Leukocytosis - wbc 14.4; check CBC Gout - continue allopurinol 300 mg by mouth daily and Colcrys 0.6 mg by mouth daily Depression - continue Cymbalta 40 mg by mouth daily Neuropathy - continue Neurontin 300 mg by mouth daily Constipation - continue MiraLAX 17 g by mouth daily and senna S1 tab by mouth twice a day    I have filled out patient's discharge paperwork and written prescriptions.  Patient will receive home health PT, OT, ST and Nursing.  Total discharge time: Greater than 30 minutes  Discharge time involved coordination of the discharge process with social worker, nursing staff and therapy department. Medical justification for home health services verified.   [facility], NP [employer] [phone]

## 2015-04-18 DIAGNOSIS — I69398 Other sequelae of cerebral infarction: Secondary | ICD-10-CM | POA: Diagnosis not present

## 2015-04-18 DIAGNOSIS — F0150 Vascular dementia without behavioral disturbance: Secondary | ICD-10-CM | POA: Diagnosis not present

## 2015-04-18 DIAGNOSIS — E119 Type 2 diabetes mellitus without complications: Secondary | ICD-10-CM | POA: Diagnosis not present

## 2015-04-18 DIAGNOSIS — I6931 Cognitive deficits following cerebral infarction: Secondary | ICD-10-CM | POA: Diagnosis not present

## 2015-04-18 DIAGNOSIS — M6281 Muscle weakness (generalized): Secondary | ICD-10-CM | POA: Diagnosis not present

## 2015-04-18 DIAGNOSIS — I69320 Aphasia following cerebral infarction: Secondary | ICD-10-CM | POA: Diagnosis not present

## 2015-04-19 DIAGNOSIS — E119 Type 2 diabetes mellitus without complications: Secondary | ICD-10-CM | POA: Diagnosis not present

## 2015-04-19 DIAGNOSIS — M6281 Muscle weakness (generalized): Secondary | ICD-10-CM | POA: Diagnosis not present

## 2015-04-19 DIAGNOSIS — I6931 Cognitive deficits following cerebral infarction: Secondary | ICD-10-CM | POA: Diagnosis not present

## 2015-04-19 DIAGNOSIS — I69398 Other sequelae of cerebral infarction: Secondary | ICD-10-CM | POA: Diagnosis not present

## 2015-04-19 DIAGNOSIS — I69320 Aphasia following cerebral infarction: Secondary | ICD-10-CM | POA: Diagnosis not present

## 2015-04-19 DIAGNOSIS — F0150 Vascular dementia without behavioral disturbance: Secondary | ICD-10-CM | POA: Diagnosis not present

## 2015-04-21 DIAGNOSIS — I6931 Cognitive deficits following cerebral infarction: Secondary | ICD-10-CM | POA: Diagnosis not present

## 2015-04-21 DIAGNOSIS — M6281 Muscle weakness (generalized): Secondary | ICD-10-CM | POA: Diagnosis not present

## 2015-04-21 DIAGNOSIS — I69320 Aphasia following cerebral infarction: Secondary | ICD-10-CM | POA: Diagnosis not present

## 2015-04-21 DIAGNOSIS — E119 Type 2 diabetes mellitus without complications: Secondary | ICD-10-CM | POA: Diagnosis not present

## 2015-04-21 DIAGNOSIS — I69398 Other sequelae of cerebral infarction: Secondary | ICD-10-CM | POA: Diagnosis not present

## 2015-04-21 DIAGNOSIS — F0150 Vascular dementia without behavioral disturbance: Secondary | ICD-10-CM | POA: Diagnosis not present

## 2015-04-22 DIAGNOSIS — M6281 Muscle weakness (generalized): Secondary | ICD-10-CM | POA: Diagnosis not present

## 2015-04-22 DIAGNOSIS — I6931 Cognitive deficits following cerebral infarction: Secondary | ICD-10-CM | POA: Diagnosis not present

## 2015-04-22 DIAGNOSIS — E119 Type 2 diabetes mellitus without complications: Secondary | ICD-10-CM | POA: Diagnosis not present

## 2015-04-22 DIAGNOSIS — F0150 Vascular dementia without behavioral disturbance: Secondary | ICD-10-CM | POA: Diagnosis not present

## 2015-04-22 DIAGNOSIS — I69320 Aphasia following cerebral infarction: Secondary | ICD-10-CM | POA: Diagnosis not present

## 2015-04-22 DIAGNOSIS — I69398 Other sequelae of cerebral infarction: Secondary | ICD-10-CM | POA: Diagnosis not present

## 2015-04-23 DIAGNOSIS — M6281 Muscle weakness (generalized): Secondary | ICD-10-CM | POA: Diagnosis not present

## 2015-04-23 DIAGNOSIS — F0150 Vascular dementia without behavioral disturbance: Secondary | ICD-10-CM | POA: Diagnosis not present

## 2015-04-23 DIAGNOSIS — I69398 Other sequelae of cerebral infarction: Secondary | ICD-10-CM | POA: Diagnosis not present

## 2015-04-23 DIAGNOSIS — I69320 Aphasia following cerebral infarction: Secondary | ICD-10-CM | POA: Diagnosis not present

## 2015-04-23 DIAGNOSIS — E119 Type 2 diabetes mellitus without complications: Secondary | ICD-10-CM | POA: Diagnosis not present

## 2015-04-23 DIAGNOSIS — I6931 Cognitive deficits following cerebral infarction: Secondary | ICD-10-CM | POA: Diagnosis not present

## 2015-04-25 DIAGNOSIS — M6281 Muscle weakness (generalized): Secondary | ICD-10-CM | POA: Diagnosis not present

## 2015-04-25 DIAGNOSIS — F0150 Vascular dementia without behavioral disturbance: Secondary | ICD-10-CM | POA: Diagnosis not present

## 2015-04-25 DIAGNOSIS — I69320 Aphasia following cerebral infarction: Secondary | ICD-10-CM | POA: Diagnosis not present

## 2015-04-25 DIAGNOSIS — E119 Type 2 diabetes mellitus without complications: Secondary | ICD-10-CM | POA: Diagnosis not present

## 2015-04-25 DIAGNOSIS — I69398 Other sequelae of cerebral infarction: Secondary | ICD-10-CM | POA: Diagnosis not present

## 2015-04-25 DIAGNOSIS — I6931 Cognitive deficits following cerebral infarction: Secondary | ICD-10-CM | POA: Diagnosis not present

## 2015-04-28 DIAGNOSIS — E119 Type 2 diabetes mellitus without complications: Secondary | ICD-10-CM | POA: Diagnosis not present

## 2015-04-28 DIAGNOSIS — I69398 Other sequelae of cerebral infarction: Secondary | ICD-10-CM | POA: Diagnosis not present

## 2015-04-28 DIAGNOSIS — F0150 Vascular dementia without behavioral disturbance: Secondary | ICD-10-CM | POA: Diagnosis not present

## 2015-04-28 DIAGNOSIS — I6931 Cognitive deficits following cerebral infarction: Secondary | ICD-10-CM | POA: Diagnosis not present

## 2015-04-28 DIAGNOSIS — M6281 Muscle weakness (generalized): Secondary | ICD-10-CM | POA: Diagnosis not present

## 2015-04-28 DIAGNOSIS — I69320 Aphasia following cerebral infarction: Secondary | ICD-10-CM | POA: Diagnosis not present

## 2015-04-29 DIAGNOSIS — I69398 Other sequelae of cerebral infarction: Secondary | ICD-10-CM | POA: Diagnosis not present

## 2015-04-29 DIAGNOSIS — E119 Type 2 diabetes mellitus without complications: Secondary | ICD-10-CM | POA: Diagnosis not present

## 2015-04-29 DIAGNOSIS — F0150 Vascular dementia without behavioral disturbance: Secondary | ICD-10-CM | POA: Diagnosis not present

## 2015-04-29 DIAGNOSIS — M6281 Muscle weakness (generalized): Secondary | ICD-10-CM | POA: Diagnosis not present

## 2015-04-29 DIAGNOSIS — I69320 Aphasia following cerebral infarction: Secondary | ICD-10-CM | POA: Diagnosis not present

## 2015-04-29 DIAGNOSIS — I6931 Cognitive deficits following cerebral infarction: Secondary | ICD-10-CM | POA: Diagnosis not present

## 2015-04-30 DIAGNOSIS — I6931 Cognitive deficits following cerebral infarction: Secondary | ICD-10-CM | POA: Diagnosis not present

## 2015-04-30 DIAGNOSIS — E119 Type 2 diabetes mellitus without complications: Secondary | ICD-10-CM | POA: Diagnosis not present

## 2015-04-30 DIAGNOSIS — I69398 Other sequelae of cerebral infarction: Secondary | ICD-10-CM | POA: Diagnosis not present

## 2015-04-30 DIAGNOSIS — I69320 Aphasia following cerebral infarction: Secondary | ICD-10-CM | POA: Diagnosis not present

## 2015-04-30 DIAGNOSIS — F0150 Vascular dementia without behavioral disturbance: Secondary | ICD-10-CM | POA: Diagnosis not present

## 2015-04-30 DIAGNOSIS — M6281 Muscle weakness (generalized): Secondary | ICD-10-CM | POA: Diagnosis not present

## 2015-05-02 DIAGNOSIS — F0150 Vascular dementia without behavioral disturbance: Secondary | ICD-10-CM | POA: Diagnosis not present

## 2015-05-02 DIAGNOSIS — E119 Type 2 diabetes mellitus without complications: Secondary | ICD-10-CM | POA: Diagnosis not present

## 2015-05-02 DIAGNOSIS — M6281 Muscle weakness (generalized): Secondary | ICD-10-CM | POA: Diagnosis not present

## 2015-05-02 DIAGNOSIS — I6931 Cognitive deficits following cerebral infarction: Secondary | ICD-10-CM | POA: Diagnosis not present

## 2015-05-02 DIAGNOSIS — I69398 Other sequelae of cerebral infarction: Secondary | ICD-10-CM | POA: Diagnosis not present

## 2015-05-02 DIAGNOSIS — I69320 Aphasia following cerebral infarction: Secondary | ICD-10-CM | POA: Diagnosis not present

## 2015-05-05 DIAGNOSIS — I6931 Cognitive deficits following cerebral infarction: Secondary | ICD-10-CM | POA: Diagnosis not present

## 2015-05-05 DIAGNOSIS — F0150 Vascular dementia without behavioral disturbance: Secondary | ICD-10-CM | POA: Diagnosis not present

## 2015-05-05 DIAGNOSIS — M6281 Muscle weakness (generalized): Secondary | ICD-10-CM | POA: Diagnosis not present

## 2015-05-05 DIAGNOSIS — I69320 Aphasia following cerebral infarction: Secondary | ICD-10-CM | POA: Diagnosis not present

## 2015-05-05 DIAGNOSIS — E119 Type 2 diabetes mellitus without complications: Secondary | ICD-10-CM | POA: Diagnosis not present

## 2015-05-05 DIAGNOSIS — I69398 Other sequelae of cerebral infarction: Secondary | ICD-10-CM | POA: Diagnosis not present

## 2015-05-06 DIAGNOSIS — I69398 Other sequelae of cerebral infarction: Secondary | ICD-10-CM | POA: Diagnosis not present

## 2015-05-06 DIAGNOSIS — I69320 Aphasia following cerebral infarction: Secondary | ICD-10-CM | POA: Diagnosis not present

## 2015-05-06 DIAGNOSIS — F0150 Vascular dementia without behavioral disturbance: Secondary | ICD-10-CM | POA: Diagnosis not present

## 2015-05-06 DIAGNOSIS — E119 Type 2 diabetes mellitus without complications: Secondary | ICD-10-CM | POA: Diagnosis not present

## 2015-05-06 DIAGNOSIS — M6281 Muscle weakness (generalized): Secondary | ICD-10-CM | POA: Diagnosis not present

## 2015-05-06 DIAGNOSIS — I6931 Cognitive deficits following cerebral infarction: Secondary | ICD-10-CM | POA: Diagnosis not present

## 2015-05-07 DIAGNOSIS — I69398 Other sequelae of cerebral infarction: Secondary | ICD-10-CM | POA: Diagnosis not present

## 2015-05-07 DIAGNOSIS — M6281 Muscle weakness (generalized): Secondary | ICD-10-CM | POA: Diagnosis not present

## 2015-05-07 DIAGNOSIS — I69320 Aphasia following cerebral infarction: Secondary | ICD-10-CM | POA: Diagnosis not present

## 2015-05-07 DIAGNOSIS — E119 Type 2 diabetes mellitus without complications: Secondary | ICD-10-CM | POA: Diagnosis not present

## 2015-05-07 DIAGNOSIS — F0150 Vascular dementia without behavioral disturbance: Secondary | ICD-10-CM | POA: Diagnosis not present

## 2015-05-07 DIAGNOSIS — I6931 Cognitive deficits following cerebral infarction: Secondary | ICD-10-CM | POA: Diagnosis not present

## 2015-05-09 DIAGNOSIS — I69320 Aphasia following cerebral infarction: Secondary | ICD-10-CM | POA: Diagnosis not present

## 2015-05-09 DIAGNOSIS — F0150 Vascular dementia without behavioral disturbance: Secondary | ICD-10-CM | POA: Diagnosis not present

## 2015-05-09 DIAGNOSIS — I69398 Other sequelae of cerebral infarction: Secondary | ICD-10-CM | POA: Diagnosis not present

## 2015-05-09 DIAGNOSIS — M6281 Muscle weakness (generalized): Secondary | ICD-10-CM | POA: Diagnosis not present

## 2015-05-09 DIAGNOSIS — I6931 Cognitive deficits following cerebral infarction: Secondary | ICD-10-CM | POA: Diagnosis not present

## 2015-05-09 DIAGNOSIS — E119 Type 2 diabetes mellitus without complications: Secondary | ICD-10-CM | POA: Diagnosis not present

## 2015-05-12 DIAGNOSIS — I69320 Aphasia following cerebral infarction: Secondary | ICD-10-CM | POA: Diagnosis not present

## 2015-05-12 DIAGNOSIS — M6281 Muscle weakness (generalized): Secondary | ICD-10-CM | POA: Diagnosis not present

## 2015-05-12 DIAGNOSIS — F0150 Vascular dementia without behavioral disturbance: Secondary | ICD-10-CM | POA: Diagnosis not present

## 2015-05-12 DIAGNOSIS — I6931 Cognitive deficits following cerebral infarction: Secondary | ICD-10-CM | POA: Diagnosis not present

## 2015-05-12 DIAGNOSIS — I69398 Other sequelae of cerebral infarction: Secondary | ICD-10-CM | POA: Diagnosis not present

## 2015-05-12 DIAGNOSIS — E119 Type 2 diabetes mellitus without complications: Secondary | ICD-10-CM | POA: Diagnosis not present

## 2015-05-13 DIAGNOSIS — I69398 Other sequelae of cerebral infarction: Secondary | ICD-10-CM | POA: Diagnosis not present

## 2015-05-13 DIAGNOSIS — M6281 Muscle weakness (generalized): Secondary | ICD-10-CM | POA: Diagnosis not present

## 2015-05-13 DIAGNOSIS — I69320 Aphasia following cerebral infarction: Secondary | ICD-10-CM | POA: Diagnosis not present

## 2015-05-13 DIAGNOSIS — F0150 Vascular dementia without behavioral disturbance: Secondary | ICD-10-CM | POA: Diagnosis not present

## 2015-05-13 DIAGNOSIS — E119 Type 2 diabetes mellitus without complications: Secondary | ICD-10-CM | POA: Diagnosis not present

## 2015-05-13 DIAGNOSIS — I6931 Cognitive deficits following cerebral infarction: Secondary | ICD-10-CM | POA: Diagnosis not present

## 2015-05-14 ENCOUNTER — Ambulatory Visit (INDEPENDENT_AMBULATORY_CARE_PROVIDER_SITE_OTHER): Payer: Medicare Other | Admitting: Neurology

## 2015-05-14 ENCOUNTER — Encounter: Payer: Self-pay | Admitting: Neurology

## 2015-05-14 VITALS — BP 135/77 | HR 59 | Ht 69.0 in | Wt 202.2 lb

## 2015-05-14 DIAGNOSIS — I611 Nontraumatic intracerebral hemorrhage in hemisphere, cortical: Secondary | ICD-10-CM | POA: Diagnosis not present

## 2015-05-14 DIAGNOSIS — F0150 Vascular dementia without behavioral disturbance: Secondary | ICD-10-CM | POA: Diagnosis not present

## 2015-05-14 DIAGNOSIS — I639 Cerebral infarction, unspecified: Secondary | ICD-10-CM | POA: Diagnosis not present

## 2015-05-14 DIAGNOSIS — I6931 Cognitive deficits following cerebral infarction: Secondary | ICD-10-CM | POA: Diagnosis not present

## 2015-05-14 DIAGNOSIS — I69398 Other sequelae of cerebral infarction: Secondary | ICD-10-CM | POA: Diagnosis not present

## 2015-05-14 DIAGNOSIS — I69320 Aphasia following cerebral infarction: Secondary | ICD-10-CM | POA: Diagnosis not present

## 2015-05-14 DIAGNOSIS — E119 Type 2 diabetes mellitus without complications: Secondary | ICD-10-CM | POA: Diagnosis not present

## 2015-05-14 DIAGNOSIS — M6281 Muscle weakness (generalized): Secondary | ICD-10-CM | POA: Diagnosis not present

## 2015-05-14 DIAGNOSIS — I4892 Unspecified atrial flutter: Secondary | ICD-10-CM | POA: Diagnosis not present

## 2015-05-14 DIAGNOSIS — I10 Essential (primary) hypertension: Secondary | ICD-10-CM | POA: Diagnosis not present

## 2015-05-14 DIAGNOSIS — I5022 Chronic systolic (congestive) heart failure: Secondary | ICD-10-CM | POA: Diagnosis not present

## 2015-05-14 DIAGNOSIS — E1159 Type 2 diabetes mellitus with other circulatory complications: Secondary | ICD-10-CM | POA: Insufficient documentation

## 2015-05-14 NOTE — Progress Notes (Signed)
STROKE NEUROLOGY FOLLOW UP NOTE  NAME: Yojan Paskett DOB: Apr 11, 1935  REASON FOR VISIT: stroke follow up HISTORY FROM: chart and pt and wife  Today we had the pleasure of seeing Geno Sydnor in follow-up at our Neurology Clinic. Pt was accompanied by wife.   History Summary Mr. Tyrin Herbers is a 80 y.o. male with history of HTN, DM, CAD, atrial flutter on Coumadin before until 2011, alcohol abuse admitted on 02/08/15 for headache, nausea vomiting, confusion, blurred vision. CT had showed left occipital ICH with small SAH, SDH and IVH. Put on 3% saline and admitted to ICU. He was then stabilized and off 3% saline. Stroke work up including MRA, CUS, 2D ehco, and EEG were negative. Repeat CT head stable. LDL 107. He was discharged to SNF after medically ready.   Interval History During the interval time, the patient has been doing well. He stayed in SNF until one month ago. He is currently at home with home PT/OT/speech 3 times per week. He still has right hemoanopia and will follow up with ophthalmology tomorrow. He also follows up with PCP next month. BP today 137/77. He is on 4 BP meds now.     REVIEW OF SYSTEMS: Full 14 system review of systems performed and notable only for those listed below and in HPI above, all others are negative:  Constitutional:   Cardiovascular:  Ear/Nose/Throat:   Skin:  Eyes:   Respiratory:   Gastroitestinal:   Genitourinary:  Hematology/Lymphatic:   Endocrine:  Musculoskeletal:  Joint swelling Allergy/Immunology:   Neurological:  Numbness and speech difficulty Psychiatric:  Sleep: daytime sleepiness   The following represents the patient's updated allergies and side effects list: No Known Allergies  The neurologically relevant items on the patient's problem list were reviewed on today's visit.  Neurologic Examination  A problem focused neurological exam (12 or more points of the single system neurologic examination, vital signs counts as 1 point,  cranial nerves count for 8 points) was performed.  Blood pressure 135/77, pulse 59, height  (1.753 m), weight 202 lb 3.2 oz (91.717 kg).  General - Well nourished, well developed, in no apparent distress.  Ophthalmologic - Fundi not visualized due to noncooperation.  Cardiovascular - Regular rate and rhythm.  Mental Status -  Awake alert and orientation to place, and person were intact, but not to time. Language exam showed fluent speech, able to follow simple commands, but towards the end of exam showed significant perseveration. Not able to read or repeat, or name.   Cranial Nerves II - XII - II - right homonymous hemianopia. III, IV, VI - eyes disconjugated (chronic) but denies double vision. V - Facial sensation intact bilaterally. VII - Facial movement intact bilaterally. VIII - Hearing & vestibular intact bilaterally. X - Palate elevates symmetrically. XI - Chin turning & shoulder shrug intact bilaterally. XII - Tongue protrusion intact.  Motor Strength - The patient's strength was normal in all extremities and pronator drift was absent.  Bulk was normal and fasciculations were absent.   Motor Tone - Muscle tone was assessed at the neck and appendages and was normal.  Reflexes - The patient's reflexes were 1+ in all extremities and he had no pathological reflexes.  Sensory - Light touch, temperature/pinprick were assessed and were normal.    Coordination - The patient had normal movements in the hands and feet with no ataxia or dysmetria.  Tremor was absent.  Gait and Station - mildly stooped posturing, able to walk without  assistance.  Data reviewed: I personally reviewed the images and agree with the radiology interpretations.  Ct Head Wo Contrast 02/16/2015 - Stable subarachnoid hemorrhage seen overlying right parietal cortex. Stable large left occipital intraparenchymal hemorrhage is noted. with surrounding white matter edema resulting in approximately 6 mL. of  left to right midline shift. Stable left-sided subdural hematoma is noted. Stable bilateral lateral intraventricular hemorrhage is noted without ventricular dilatation.  02/10/2015 - 1. No significant interval change in size of left occipital lobe intraparenchymal hematoma with slightly increased associated vasogenic edema as compared to previous. Similar trace left-to-right shift. 2. Stable to slightly decreased conspicuity of bilateral small volume subarachnoid hemorrhage. 3. Stable intraventricular hemorrhage. No hydrocephalus. 4. Persistent left subdural hematoma measuring up to 3.5 mm in maximal diameter. Blood along the left tentorium is decreased in conspicuity. 5. No new intracranial abnormality.  02/09/2015 IMPRESSION: 1. Slight enlargement of large left occipital lobe intra-axial hemorrhage. Estimated hemorrhage volume now 58 mm. Stable left posterior hemisphere edema and mass effect. 2. Mild progression of subarachnoid extension, now bilateral. Extension and of a left subdural space with small broad-based left subdural hematoma not significantly changed. Stable intraventricular hemorrhage volume. No ventriculomegaly. 3. No new intracranial abnormality identified.   02/08/2015 IMPRESSION: 1. 6.7 cm left occipital parenchymal hemorrhage with mild surrounding edema. No midline shift. 2. Small amount of adjacent subarachnoid hemorrhage and small amount of intraventricular extension of blood. 3. Small subdural hematomas over the left cerebral convexity and left tentorium.   Dg Chest Port 1 View 02/13/15 1. Stable support apparatus. 2. Improved basilar atelectasis. 02/10/15 Tube and catheter positions as described without pneumothorax. No edema or consolidation. 02/09/2015 IMPRESSION: No acute cardiopulmonary disease.   Carotid Doppler No evidence of significant stenosis in the right or left ICA. The left side was technically challenged due to line placement.   2D Echocardiogram Left  ventricle: The cavity size was normal. Wall thickness was increased in a pattern of moderate LVH. Systolic function was normal. The estimated ejection fraction was in the range of 55% to 60%. Doppler parameters are consistent with elevated ventricular end-diastolic filling pressure. - Aortic valve: Poorly seen Moderately calcified mild stenosis by gradients but may be underestimated due to poor CW angle. Valve area (VTI): 2.11 cm^2. Valve area (Vmax): 2.01 cm^2. Valve area (Vmean): 1.88 cm^2. - Mitral valve: There was mild regurgitation. - Left atrium: The atrium was moderately dilated. - Impressions: Cannot r/o SOE due to extremely poor image quality. Impressions: - Cannot r/o SOE due to extremely poor image quality.  MRI and MRA  7.3 x 3.3 cm left occipital hematoma unchanged from the prior CT. There is intraventricular hemorrhage and subarachnoid hemorrhage also unchanged. There is mass-effect on the left occipital horn and 3 mm midline shift to the right. No evidence of underlying tumor or vascular malformation. This may be related to cerebral amyloid angiography. Hypertension also possible. Negative MRA circle Willis.  EEG This EEG is abnormal with moderately severe generalized continuous nonspecific slowing of cerebral activity. No evidence of epileptic activity was recorded. The absence of epileptiform activity during an EEG recording does not, in and of itself, rule out seizure disorder, however.  EKG NSR  Component     Latest Ref Rng 02/09/2015  Cholesterol     0 - 200 mg/dL 161  Triglycerides     <150 mg/dL 47  HDL Cholesterol     >40 mg/dL 62  Total CHOL/HDL Ratio      2.9  VLDL  0 - 40 mg/dL 9  LDL (calc)     0 - 99 mg/dL 161 (H)  Hemoglobin W9U     4.8 - 5.6 % 6.0 (H)  Mean Plasma Glucose      126  TSH     0.350 - 4.500 uIU/mL 1.154    Assessment: As you may recall, he is a 80 y.o. African American male with PMH of HTN, DM, CAD, atrial  flutter on Coumadin before until 2010, alcohol abuse admitted on 02/08/15 for left occipital ICH with SAH, SDH and IVH. Recovered well with no need of surgical intervention. Stroke work up largely negative except LDL 107. Etiology still most likely HTN but CAA not able to completely ruled out. Pt later was sent to SNF and then home. Currently has home therapy. Still has significant language deficit and hemianopia. BP stable on 4 BP meds. Will repeat CT to see the resolution of bleed and will consider ASA 81mg  if blood all absorbed. Has hx of afib on coumadin but in 2011 Dr. Antoine Poche considered his Aflutter was cured and he was then off coumadin.   Plan:  - continue lipitor for stroke prevention - CT head to evaluate the resolution of hemorrhage - will start baby ASA daily if blood all absorbed - no driving due to vision change - follow up with eye doctor to monitor visual field change. - Follow up with your primary care physician for stroke risk factor modification. Recommend maintain blood pressure goal <130/80, diabetes with hemoglobin A1c goal below 6.5% and lipids with LDL cholesterol goal below 70 mg/dL.  - continue PT/OT/speech - check BP and glucose at home. - RTC in 3 months.  Orders Placed This Encounter  Procedures  . CT Head Wo Contrast    Standing Status: Future     Number of Occurrences:      Standing Expiration Date: 08/13/2016    Order Specific Question:  Reason for Exam (SYMPTOM  OR DIAGNOSIS REQUIRED)    Answer:  ICH follow up    Order Specific Question:  Preferred imaging location?    Answer:  Internal    Meds ordered this encounter  Medications  . mirtazapine (REMERON) 15 MG tablet    Sig:   . l-methylfolate-B6-B12 (METANX) 3-35-2 MG TABS    Sig: Take 1 tablet by mouth daily.  . tamsulosin (FLOMAX) 0.4 MG CAPS capsule    Sig: Take 0.4 mg by mouth.  . metFORMIN (GLUCOPHAGE) 500 MG tablet    Sig: Take by mouth 2 (two) times daily with a meal.    Patient  Instructions  - continue lipitor for cholesterol and stroke prevention - will do CT head to evaluate the resolution of hemorrhage - if blood all absorbed, will start baby ASA daily - no driving due to vision change - follow up with eye doctor to monitor visual field change. - Follow up with your primary care physician for stroke risk factor modification. Recommend maintain blood pressure goal <130/80, diabetes with hemoglobin A1c goal below 6.5% and lipids with LDL cholesterol goal below 70 mg/dL.  - continue PT/OT/speech - check BP and glucose at home. - follow up in 3 months.    Marvel Plan, MD PhD [facility] [address] [place], [place] [address] [phone]

## 2015-05-14 NOTE — Patient Instructions (Signed)
-   continue lipitor for cholesterol and stroke prevention - will do CT head to evaluate the resolution of hemorrhage - if blood all absorbed, will start baby ASA daily - no driving due to vision change - follow up with eye doctor to monitor visual field change. - Follow up with your primary care physician for stroke risk factor modification. Recommend maintain blood pressure goal <130/80, diabetes with hemoglobin A1c goal below 6.5% and lipids with LDL cholesterol goal below 70 mg/dL.  - continue PT/OT/speech - check BP and glucose at home. - follow up in 3 months.

## 2015-05-17 DIAGNOSIS — M6281 Muscle weakness (generalized): Secondary | ICD-10-CM | POA: Diagnosis not present

## 2015-05-17 DIAGNOSIS — I6931 Cognitive deficits following cerebral infarction: Secondary | ICD-10-CM | POA: Diagnosis not present

## 2015-05-17 DIAGNOSIS — I69320 Aphasia following cerebral infarction: Secondary | ICD-10-CM | POA: Diagnosis not present

## 2015-05-17 DIAGNOSIS — F0150 Vascular dementia without behavioral disturbance: Secondary | ICD-10-CM | POA: Diagnosis not present

## 2015-05-17 DIAGNOSIS — I69398 Other sequelae of cerebral infarction: Secondary | ICD-10-CM | POA: Diagnosis not present

## 2015-05-17 DIAGNOSIS — E119 Type 2 diabetes mellitus without complications: Secondary | ICD-10-CM | POA: Diagnosis not present

## 2015-05-19 DIAGNOSIS — E119 Type 2 diabetes mellitus without complications: Secondary | ICD-10-CM | POA: Diagnosis not present

## 2015-05-19 DIAGNOSIS — I69398 Other sequelae of cerebral infarction: Secondary | ICD-10-CM | POA: Diagnosis not present

## 2015-05-19 DIAGNOSIS — M6281 Muscle weakness (generalized): Secondary | ICD-10-CM | POA: Diagnosis not present

## 2015-05-19 DIAGNOSIS — F0150 Vascular dementia without behavioral disturbance: Secondary | ICD-10-CM | POA: Diagnosis not present

## 2015-05-19 DIAGNOSIS — I6931 Cognitive deficits following cerebral infarction: Secondary | ICD-10-CM | POA: Diagnosis not present

## 2015-05-19 DIAGNOSIS — I69320 Aphasia following cerebral infarction: Secondary | ICD-10-CM | POA: Diagnosis not present

## 2015-05-20 DIAGNOSIS — I69320 Aphasia following cerebral infarction: Secondary | ICD-10-CM | POA: Diagnosis not present

## 2015-05-20 DIAGNOSIS — E119 Type 2 diabetes mellitus without complications: Secondary | ICD-10-CM | POA: Diagnosis not present

## 2015-05-20 DIAGNOSIS — M6281 Muscle weakness (generalized): Secondary | ICD-10-CM | POA: Diagnosis not present

## 2015-05-20 DIAGNOSIS — I69398 Other sequelae of cerebral infarction: Secondary | ICD-10-CM | POA: Diagnosis not present

## 2015-05-20 DIAGNOSIS — F0150 Vascular dementia without behavioral disturbance: Secondary | ICD-10-CM | POA: Diagnosis not present

## 2015-05-20 DIAGNOSIS — I6931 Cognitive deficits following cerebral infarction: Secondary | ICD-10-CM | POA: Diagnosis not present

## 2015-05-21 DIAGNOSIS — E119 Type 2 diabetes mellitus without complications: Secondary | ICD-10-CM | POA: Diagnosis not present

## 2015-05-21 DIAGNOSIS — I69398 Other sequelae of cerebral infarction: Secondary | ICD-10-CM | POA: Diagnosis not present

## 2015-05-21 DIAGNOSIS — F0150 Vascular dementia without behavioral disturbance: Secondary | ICD-10-CM | POA: Diagnosis not present

## 2015-05-21 DIAGNOSIS — I69320 Aphasia following cerebral infarction: Secondary | ICD-10-CM | POA: Diagnosis not present

## 2015-05-21 DIAGNOSIS — M6281 Muscle weakness (generalized): Secondary | ICD-10-CM | POA: Diagnosis not present

## 2015-05-21 DIAGNOSIS — I6931 Cognitive deficits following cerebral infarction: Secondary | ICD-10-CM | POA: Diagnosis not present

## 2015-05-24 DIAGNOSIS — M6281 Muscle weakness (generalized): Secondary | ICD-10-CM | POA: Diagnosis not present

## 2015-05-24 DIAGNOSIS — I6931 Cognitive deficits following cerebral infarction: Secondary | ICD-10-CM | POA: Diagnosis not present

## 2015-05-24 DIAGNOSIS — F0150 Vascular dementia without behavioral disturbance: Secondary | ICD-10-CM | POA: Diagnosis not present

## 2015-05-24 DIAGNOSIS — E119 Type 2 diabetes mellitus without complications: Secondary | ICD-10-CM | POA: Diagnosis not present

## 2015-05-24 DIAGNOSIS — I69320 Aphasia following cerebral infarction: Secondary | ICD-10-CM | POA: Diagnosis not present

## 2015-05-24 DIAGNOSIS — I69398 Other sequelae of cerebral infarction: Secondary | ICD-10-CM | POA: Diagnosis not present

## 2015-06-03 ENCOUNTER — Emergency Department (HOSPITAL_COMMUNITY): Payer: Medicare Other

## 2015-06-03 ENCOUNTER — Encounter (HOSPITAL_COMMUNITY): Payer: Self-pay

## 2015-06-03 ENCOUNTER — Emergency Department (HOSPITAL_COMMUNITY)
Admission: EM | Admit: 2015-06-03 | Discharge: 2015-06-03 | Disposition: A | Discharge status: Home / Self Care | Payer: Medicare Other | Attending: Emergency Medicine | Admitting: Emergency Medicine

## 2015-06-03 DIAGNOSIS — E119 Type 2 diabetes mellitus without complications: Secondary | ICD-10-CM | POA: Insufficient documentation

## 2015-06-03 DIAGNOSIS — F0390 Unspecified dementia without behavioral disturbance: Secondary | ICD-10-CM | POA: Insufficient documentation

## 2015-06-03 DIAGNOSIS — K219 Gastro-esophageal reflux disease without esophagitis: Secondary | ICD-10-CM | POA: Insufficient documentation

## 2015-06-03 DIAGNOSIS — Z79899 Other long term (current) drug therapy: Secondary | ICD-10-CM | POA: Diagnosis not present

## 2015-06-03 DIAGNOSIS — I10 Essential (primary) hypertension: Secondary | ICD-10-CM | POA: Diagnosis not present

## 2015-06-03 DIAGNOSIS — F329 Major depressive disorder, single episode, unspecified: Secondary | ICD-10-CM | POA: Diagnosis not present

## 2015-06-03 DIAGNOSIS — G4751 Confusional arousals: Secondary | ICD-10-CM | POA: Diagnosis not present

## 2015-06-03 DIAGNOSIS — E860 Dehydration: Secondary | ICD-10-CM

## 2015-06-03 DIAGNOSIS — Z8739 Personal history of other diseases of the musculoskeletal system and connective tissue: Secondary | ICD-10-CM | POA: Diagnosis not present

## 2015-06-03 DIAGNOSIS — J986 Disorders of diaphragm: Secondary | ICD-10-CM | POA: Diagnosis not present

## 2015-06-03 DIAGNOSIS — I2510 Atherosclerotic heart disease of native coronary artery without angina pectoris: Secondary | ICD-10-CM | POA: Diagnosis not present

## 2015-06-03 DIAGNOSIS — I252 Old myocardial infarction: Secondary | ICD-10-CM | POA: Diagnosis not present

## 2015-06-03 DIAGNOSIS — I959 Hypotension, unspecified: Secondary | ICD-10-CM | POA: Diagnosis not present

## 2015-06-03 DIAGNOSIS — Z87891 Personal history of nicotine dependence: Secondary | ICD-10-CM | POA: Insufficient documentation

## 2015-06-03 DIAGNOSIS — G9389 Other specified disorders of brain: Secondary | ICD-10-CM | POA: Diagnosis not present

## 2015-06-03 DIAGNOSIS — J9811 Atelectasis: Secondary | ICD-10-CM | POA: Diagnosis not present

## 2015-06-03 LAB — COMPREHENSIVE METABOLIC PANEL
ALT: 20 U/L (ref 17–63)
AST: 26 U/L (ref 15–41)
Albumin: 3.5 g/dL (ref 3.5–5.0)
Alkaline Phosphatase: 50 U/L (ref 38–126)
Anion gap: 7 (ref 5–15)
BUN: 13 mg/dL (ref 6–20)
CO2: 24 mmol/L (ref 22–32)
CREATININE: 1.17 mg/dL (ref 0.61–1.24)
Calcium: 8.6 mg/dL — ABNORMAL LOW (ref 8.9–10.3)
Chloride: 110 mmol/L (ref 101–111)
GFR calc Af Amer: >60 mL/min (ref >60)
GFR, EST NON AFRICAN AMERICAN: 58 mL/min — AB (ref >60)
Glucose, Bld: 83 mg/dL (ref 65–99)
Potassium: 3.5 mmol/L (ref 3.5–5.1)
Sodium: 141 mmol/L (ref 135–145)
Total Bilirubin: 0.4 mg/dL (ref 0.3–1.2)
Total Protein: 6.3 g/dL — ABNORMAL LOW (ref 6.5–8.1)

## 2015-06-03 LAB — CBC WITH DIFFERENTIAL/PLATELET
Basophils Absolute: 0 10*3/uL (ref 0.0–0.1)
Basophils Relative: 0 % (ref 0–1)
EOS ABS: 0.3 10*3/uL (ref 0.0–0.7)
EOS PCT: 4 % (ref 0–5)
HCT: 35.6 % — ABNORMAL LOW (ref 39.0–52.0)
Hemoglobin: 11.7 g/dL — ABNORMAL LOW (ref 13.0–17.0)
LYMPHS ABS: 1.3 10*3/uL (ref 0.7–4.0)
Lymphocytes Relative: 19 % (ref 12–46)
MCH: 29.7 pg (ref 26.0–34.0)
MCHC: 32.9 g/dL (ref 30.0–36.0)
MCV: 90.4 fL (ref 78.0–100.0)
Monocytes Absolute: 0.6 10*3/uL (ref 0.1–1.0)
Monocytes Relative: 8 % (ref 3–12)
Neutro Abs: 4.9 10*3/uL (ref 1.7–7.7)
Neutrophils Relative %: 69 % (ref 43–77)
PLATELETS: 134 10*3/uL — AB (ref 150–400)
RBC: 3.94 MIL/uL — ABNORMAL LOW (ref 4.22–5.81)
RDW: 13.7 % (ref 11.5–15.5)
WBC: 7.1 10*3/uL (ref 4.0–10.5)

## 2015-06-03 LAB — URINALYSIS, ROUTINE W REFLEX MICROSCOPIC
Bilirubin Urine: NEGATIVE
GLUCOSE, UA: NEGATIVE mg/dL
HGB URINE DIPSTICK: NEGATIVE
Ketones, ur: NEGATIVE mg/dL
Leukocytes, UA: NEGATIVE
Nitrite: NEGATIVE
PH: 5.5 (ref 5.0–8.0)
Protein, ur: NEGATIVE mg/dL
SPECIFIC GRAVITY, URINE: 1.011 (ref 1.005–1.030)
Urobilinogen, UA: 0.2 mg/dL (ref 0.0–1.0)

## 2015-06-03 LAB — CBG MONITORING, ED
Glucose-Capillary: 77 mg/dL (ref 65–99)
Glucose-Capillary: 99 mg/dL (ref 65–99)

## 2015-06-03 LAB — I-STAT TROPONIN, ED: Troponin i, poc: 0 ng/mL (ref 0.00–0.08)

## 2015-06-03 LAB — I-STAT CG4 LACTIC ACID, ED: LACTIC ACID, VENOUS: 1.82 mmol/L (ref 0.5–2.0)

## 2015-06-03 MED ORDER — SODIUM CHLORIDE 0.9 % IV BOLUS (SEPSIS)
500.0000 mL | Freq: Once | INTRAVENOUS | Status: AC
  Administered 2015-06-03: 500 mL via INTRAVENOUS

## 2015-06-03 NOTE — ED Notes (Signed)
Pt. Ambulated independently in hall with RN and NT. Pt. Complaint of some blurred vision, MD at bedside and pt. Given food. Will recheck CBG

## 2015-06-03 NOTE — Discharge Instructions (Signed)
Dehydration, Adult Dehydration is when you lose more fluids from the body than you take in. Vital organs like the kidneys, brain, and heart cannot function without a proper amount of fluids and salt. Any loss of fluids from the body can cause dehydration.  CAUSES   Vomiting.  Diarrhea.  Excessive sweating.  Excessive urine output.  Fever. SYMPTOMS  Mild dehydration  Thirst.  Dry lips.  Slightly dry mouth. Moderate dehydration  Very dry mouth.  Sunken eyes.  Skin does not bounce back quickly when lightly pinched and released.  Dark urine and decreased urine production.  Decreased tear production.  Headache. Severe dehydration  Very dry mouth.  Extreme thirst.  Rapid, weak pulse (more than 100 beats per minute at rest).  Cold hands and feet.  Not able to sweat in spite of heat and temperature.  Rapid breathing.  Blue lips.  Confusion and lethargy.  Difficulty being awakened.  Minimal urine production.  No tears. DIAGNOSIS  Your caregiver will diagnose dehydration based on your symptoms and your exam. Blood and urine tests will help confirm the diagnosis. The diagnostic evaluation should also identify the cause of dehydration. TREATMENT  Treatment of mild or moderate dehydration can often be done at home by increasing the amount of fluids that you drink. It is best to drink small amounts of fluid more often. Drinking too much at one time can make vomiting worse. Refer to the home care instructions below. Severe dehydration needs to be treated at the hospital where you will probably be given intravenous (IV) fluids that contain water and electrolytes. HOME CARE INSTRUCTIONS   Ask your caregiver about specific rehydration instructions.  Drink enough fluids to keep your urine clear or pale yellow.  Drink small amounts frequently if you have nausea and vomiting.  Eat as you normally do.  Avoid:  Foods or drinks high in sugar.  Carbonated  drinks.  Juice.  Extremely hot or cold fluids.  Drinks with caffeine.  Fatty, greasy foods.  Alcohol.  Tobacco.  Overeating.  Gelatin desserts.  Wash your hands well to avoid spreading bacteria and viruses.  Only take over-the-counter or prescription medicines for pain, discomfort, or fever as directed by your caregiver.  Ask your caregiver if you should continue all prescribed and over-the-counter medicines.  Keep all follow-up appointments with your caregiver. SEEK MEDICAL CARE IF:  You have abdominal pain and it increases or stays in one area (localizes).  You have a rash, stiff neck, or severe headache.  You are irritable, sleepy, or difficult to awaken.  You are weak, dizzy, or extremely thirsty. SEEK IMMEDIATE MEDICAL CARE IF:   You are unable to keep fluids down or you get worse despite treatment.  You have frequent episodes of vomiting or diarrhea.  You have blood or green matter (bile) in your vomit.  You have blood in your stool or your stool looks black and tarry.  You have not urinated in 6 to 8 hours, or you have only urinated a small amount of very dark urine.  You have a fever.  You faint. MAKE SURE YOU:   Understand these instructions.  Will watch your condition.  Will get help right away if you are not doing well or get worse. Document Released: 09/20/2005 Document Revised: 12/13/2011 Document Reviewed: 05/10/2011 Exit[employer] Patient Information 2015 [employer]. This information is not intended to replace advice given to you by your health care provider. Make sure you discuss any questions you have with your health care   provider.  

## 2015-06-03 NOTE — ED Provider Notes (Signed)
CSN: [id number]     Arrival date & time 06/03/15  1118 History   None    Chief Complaint  Patient presents with  . Hypotension     (Consider location/radiation/quality/duration/timing/severity/associated sxs/prior Treatment) HPI Comments: Patient with a history of diabetes, hypertension, hyperlipidemia and dementia presents with increased confusion and hypotension. His wife states that he got agitated yesterday and wandered off from the house. He was gone for about 3-4 hours and per their report walked about 3-4 miles before being found. This was out in the heat. She states that today patient had a little bit of increased confusion and had an elevated blood sugar of 222 at home. The wife checked his blood pressure and it was low at home in the 80s. For this he was brought over for further evaluation. She hasn't noticed any increased cough. There's been no vomiting or change in stools other than he had a loose stool yesterday. He is urinating normally. There's been no reported recent falls other than a fall about 3-4 weeks ago with no apparent injury. No new changes in medications.   Past Medical History  Diagnosis Date  . Hypertension   . Coronary artery disease   . Diabetes mellitus without complication   . MI, old   . Reflux   . Depression   . Cervicalgia   . Disturbance of skin sensation   . Coronary atherosclerosis of unspecified type of vessel, native or graft   . Unspecified hearing loss   . Unspecified sleep apnea   . Alcohol abuse, in remission   . Atrial flutter    Past Surgical History  Procedure Laterality Date  . Hernia repair    . Rotator cuff repair     Family History  Problem Relation Age of Onset  . Anxiety disorder Mother   . Cancer Mother    Social History  Substance Use Topics  . Smoking status: Former Games developer  . Smokeless tobacco: Never Used  . Alcohol Use: No    Review of Systems  Unable to perform ROS: Dementia      Allergies  Review of  patient's allergies indicates no known allergies.  Home Medications   Prior to Admission medications   Medication Sig Start Date End Date Taking? Authorizing Provider  acetaminophen (TYLENOL) 325 MG tablet Take 2 tablets (650 mg total) by mouth every 4 (four) hours as needed for mild pain (or temp > 99 F). Patient not taking: Reported on 05/14/2015 02/21/15   Onalee Hua L Rinehuls, PA-C  albuterol (PROVENTIL) (2.5 MG/3ML) 0.083% nebulizer solution Take 3 mLs (2.5 mg total) by nebulization 2 (two) times daily as needed for wheezing or shortness of breath. Patient not taking: Reported on 05/14/2015 02/21/15   Onalee Hua L Rinehuls, PA-C  allopurinol (ZYLOPRIM) 300 MG tablet Take 300 mg by mouth daily.    Historical Provider, MD  amLODipine (NORVASC) 5 MG tablet Take 1 tablet (5 mg total) by mouth daily. 02/21/15   David L Rinehuls, PA-C  atorvastatin (LIPITOR) 10 MG tablet Take 1 tablet (10 mg total) by mouth daily at 6 PM. 02/21/15   David L Rinehuls, PA-C  carvedilol (COREG) 25 MG tablet Take 1 tablet (25 mg total) by mouth 2 (two) times daily with a meal. 02/21/15   David L Rinehuls, PA-C  DULoxetine 40 MG CPEP Take 40 mg by mouth daily. 02/21/15   David L Rinehuls, PA-C  feeding supplement, ENSURE ENLIVE, (ENSURE ENLIVE) LIQD Take 237 mLs by mouth 2 (two) times daily  between meals. 02/21/15   David L Rinehuls, PA-C  gabapentin (NEURONTIN) 300 MG capsule Take 300 mg by mouth daily.    Historical Provider, MD  hydrALAZINE (APRESOLINE) 50 MG tablet Take 1 tablet (50 mg total) by mouth every 8 (eight) hours. 02/21/15   David L Rinehuls, PA-C  l-methylfolate-B6-B12 (METANX) 3-35-2 MG TABS Take 1 tablet by mouth daily.    Historical Provider, MD  lisinopril (PRINIVIL,ZESTRIL) 20 MG tablet Take 1 tablet (20 mg total) by mouth 2 (two) times daily. 02/21/15   David L Rinehuls, PA-C  metFORMIN (GLUCOPHAGE) 500 MG tablet Take by mouth 2 (two) times daily with a meal.    Historical Provider, MD  mirtazapine (REMERON) 15 MG  tablet  05/06/15   Historical Provider, MD  Multiple Vitamin (MULTIVITAMIN WITH MINERALS) TABS tablet Take 1 tablet by mouth daily. 02/21/15   David L Rinehuls, PA-C  nitroGLYCERIN (NITROSTAT) 0.4 MG SL tablet Place 0.4 mg under the tongue every 5 (five) minutes as needed. Chest pain    Historical Provider, MD  omeprazole (PRILOSEC) 20 MG capsule Take 20 mg by mouth daily.    Historical Provider, MD  QUEtiapine (SEROQUEL) 25 MG tablet Take 1 tablet (25 mg total) by mouth at bedtime. 02/21/15   David L Rinehuls, PA-C  tamsulosin (FLOMAX) 0.4 MG CAPS capsule Take 0.4 mg by mouth.    Historical Provider, MD   BP 132/68 mmHg  Pulse 51  Temp(Src) 98.6 F (37 C) (Oral)  Resp 13  SpO2 98% Physical Exam  Constitutional: He appears well-developed and well-nourished.  HENT:  Head: Normocephalic and atraumatic.  Eyes: Pupils are equal, round, and reactive to light.  Neck: Normal range of motion. Neck supple.  Cardiovascular: Normal rate, regular rhythm and normal heart sounds.   Pulmonary/Chest: Effort normal and breath sounds normal. No respiratory distress. He has no wheezes. He has no rales. He exhibits no tenderness.  Abdominal: Soft. Bowel sounds are normal. There is no tenderness. There is no rebound and no guarding.  Musculoskeletal: Normal range of motion. He exhibits no edema.  Lymphadenopathy:    He has no cervical adenopathy.  Neurological: He is alert.  Moves all extremities symmetrically, no pronator drift, finger-nose intact  Skin: Skin is warm and dry. No rash noted.  Psychiatric: He has a normal mood and affect.    ED Course  Procedures (including critical care time) Labs Review Labs Reviewed  COMPREHENSIVE METABOLIC PANEL - Abnormal; Notable for the following:    Calcium 8.6 (*)    Total Protein 6.3 (*)    GFR calc non Af Amer 58 (*)    All other components within normal limits  CBC WITH DIFFERENTIAL/PLATELET - Abnormal; Notable for the following:    RBC 3.94 (*)     Hemoglobin 11.7 (*)    HCT 35.6 (*)    Platelets 134 (*)    All other components within normal limits  URINALYSIS, ROUTINE W REFLEX MICROSCOPIC (NOT AT [facility])  I-STAT CG4 LACTIC ACID, ED  I-STAT TROPOININ, ED  CBG MONITORING, ED    Imaging Review Dg Chest 2 View  06/03/2015   CLINICAL DATA:  Altered mental status  EXAM: CHEST  2 VIEW  COMPARISON:  02/16/2015  FINDINGS: Normal heart size. Negative for heart failure. Elevated left hemidiaphragm is unchanged. Mild left lower lobe atelectasis. Negative for pneumonia.  IMPRESSION: Elevated left hemidiaphragm with mild left lower lobe atelectasis. Otherwise no acute abnormality.   Electronically Signed   By: Marlan Palau M.D.  On: 06/03/2015 12:28   Ct Head Wo Contrast  06/03/2015   CLINICAL DATA:  Weakness, hypotension. Altered mental status. lost going home yesterday.  EXAM: CT HEAD WITHOUT CONTRAST  TECHNIQUE: Contiguous axial images were obtained from the base of the skull through the vertex without intravenous contrast.  COMPARISON:  02/16/2015  FINDINGS: Area of encephalomalacia within the posterior left parietal and left occipital lobes, in area of prior hemorrhage. There is atrophy and chronic small vessel disease changes. No acute intracranial abnormality. Specifically, no hemorrhage, hydrocephalus, mass lesion, acute infarction, or significant intracranial injury. No acute calvarial abnormality. Visualized paranasal sinuses and mastoids clear. Orbital soft tissues unremarkable.  IMPRESSION: No acute intracranial abnormality.  Atrophy, chronic microvascular disease.  Encephalomalacia posteriorly in left cerebral hemispheric in area of prior hemorrhage.   Electronically Signed   By: Charlett Nose M.D.   On: 06/03/2015 12:19   I have personally reviewed and evaluated these images and lab results as part of my medical decision-making.   EKG Interpretation   Date/Time:  Tuesday June 03 2015 11:23:56 EDT Ventricular Rate:  57 PR Interval:   201 QRS Duration: 93 QT Interval:  402 QTC Calculation: 391 R Axis:   55 Text Interpretation:  Sinus rhythm Nonspecific T abnormalities, lateral  leads similar to EKG from 2003 Confirmed by Irineo Gaulin  MD, Tre Sanker ([id number]) on  06/03/2015 11:27:39 AM      MDM   Final diagnoses:  Dehydration    Patient presents with some hypotension likely secondary dehydration. He was outside in the heat for a long period time yesterday and he hasn't had much to eat or drink since that time. Reportedly his blood sugar was 222 at home. However is 83 here. His blood pressure has been stable in the ED. He was given some IV fluids. He is ambulated without difficulty. He has no ataxia. He did report some dizziness on ambulation. We rechecked his blood sugar and it was 77. He ate a sandwich and is currently drinking some juice. Will recheck the blood sugar and as long as it continues to improve, I feel that he can be discharged home. He has no evidence of infection. He has no abdominal tenderness. His labs are unremarkable. He has no stroke symptoms. Will likely discharge with close follow-up with his PCP. Return precautions were given.    Rolan Bucco, MD 06/03/15 [phone]

## 2015-06-03 NOTE — ED Notes (Signed)
Pt. Coming from home with complaint of weakness and hypotension. Pt. With hx of dementia, family notes that pt. Wandered off from home yesterday and made it approx 3-4 miles before being found. Since has been weaker than normal and this morning family noted pt. To be hypotensive at 84/50. Pt given 400 ml NS in route, BP now 109/66. Pt. Alert and oriented to self. Pt. CBG 222 at home. Family denies changes to pt. Bowel/bladder habits.

## 2015-08-02 ENCOUNTER — Other Ambulatory Visit (HOSPITAL_COMMUNITY): Payer: Self-pay | Admitting: Psychiatry

## 2015-08-05 DIAGNOSIS — F0280 Dementia in other diseases classified elsewhere without behavioral disturbance: Secondary | ICD-10-CM | POA: Diagnosis not present

## 2015-08-05 DIAGNOSIS — E119 Type 2 diabetes mellitus without complications: Secondary | ICD-10-CM | POA: Diagnosis not present

## 2015-08-05 DIAGNOSIS — G309 Alzheimer's disease, unspecified: Secondary | ICD-10-CM | POA: Diagnosis not present

## 2015-08-05 DIAGNOSIS — I69319 Unspecified symptoms and signs involving cognitive functions following cerebral infarction: Secondary | ICD-10-CM | POA: Diagnosis not present

## 2015-08-05 DIAGNOSIS — I110 Hypertensive heart disease with heart failure: Secondary | ICD-10-CM | POA: Diagnosis not present

## 2015-08-05 DIAGNOSIS — F0150 Vascular dementia without behavioral disturbance: Secondary | ICD-10-CM | POA: Diagnosis not present

## 2015-08-09 DIAGNOSIS — E119 Type 2 diabetes mellitus without complications: Secondary | ICD-10-CM | POA: Diagnosis not present

## 2015-08-09 DIAGNOSIS — G309 Alzheimer's disease, unspecified: Secondary | ICD-10-CM | POA: Diagnosis not present

## 2015-08-09 DIAGNOSIS — I69319 Unspecified symptoms and signs involving cognitive functions following cerebral infarction: Secondary | ICD-10-CM | POA: Diagnosis not present

## 2015-08-09 DIAGNOSIS — I110 Hypertensive heart disease with heart failure: Secondary | ICD-10-CM | POA: Diagnosis not present

## 2015-08-09 DIAGNOSIS — F0280 Dementia in other diseases classified elsewhere without behavioral disturbance: Secondary | ICD-10-CM | POA: Diagnosis not present

## 2015-08-09 DIAGNOSIS — F0150 Vascular dementia without behavioral disturbance: Secondary | ICD-10-CM | POA: Diagnosis not present

## 2015-08-14 DIAGNOSIS — F0280 Dementia in other diseases classified elsewhere without behavioral disturbance: Secondary | ICD-10-CM | POA: Diagnosis not present

## 2015-08-14 DIAGNOSIS — E119 Type 2 diabetes mellitus without complications: Secondary | ICD-10-CM | POA: Diagnosis not present

## 2015-08-14 DIAGNOSIS — F0150 Vascular dementia without behavioral disturbance: Secondary | ICD-10-CM | POA: Diagnosis not present

## 2015-08-14 DIAGNOSIS — I110 Hypertensive heart disease with heart failure: Secondary | ICD-10-CM | POA: Diagnosis not present

## 2015-08-14 DIAGNOSIS — G309 Alzheimer's disease, unspecified: Secondary | ICD-10-CM | POA: Diagnosis not present

## 2015-08-14 DIAGNOSIS — I69319 Unspecified symptoms and signs involving cognitive functions following cerebral infarction: Secondary | ICD-10-CM | POA: Diagnosis not present

## 2015-08-18 ENCOUNTER — Encounter: Payer: Self-pay | Admitting: Neurology

## 2015-08-18 ENCOUNTER — Ambulatory Visit (INDEPENDENT_AMBULATORY_CARE_PROVIDER_SITE_OTHER): Payer: Medicare Other | Admitting: Neurology

## 2015-08-18 VITALS — BP 125/75 | HR 61 | Ht 69.0 in | Wt 204.2 lb

## 2015-08-18 DIAGNOSIS — I619 Nontraumatic intracerebral hemorrhage, unspecified: Secondary | ICD-10-CM | POA: Diagnosis not present

## 2015-08-18 DIAGNOSIS — R4189 Other symptoms and signs involving cognitive functions and awareness: Secondary | ICD-10-CM | POA: Diagnosis not present

## 2015-08-18 DIAGNOSIS — E1159 Type 2 diabetes mellitus with other circulatory complications: Secondary | ICD-10-CM | POA: Diagnosis not present

## 2015-08-18 DIAGNOSIS — I639 Cerebral infarction, unspecified: Secondary | ICD-10-CM | POA: Diagnosis not present

## 2015-08-18 DIAGNOSIS — I10 Essential (primary) hypertension: Secondary | ICD-10-CM

## 2015-08-18 NOTE — Progress Notes (Signed)
STROKE NEUROLOGY FOLLOW UP NOTE  NAME: Austin Sawyer DOB: 02-Jul-1935  REASON FOR VISIT: stroke follow up HISTORY FROM: chart and Austin Sawyer and wife  Today we had the pleasure of seeing Austin Sawyer in follow-up at our Neurology Clinic. Austin Sawyer was accompanied by wife.   History Summary Austin Sawyer is a 80 y.o. male with history of HTN, DM, CAD, atrial flutter on Coumadin before until 2011, alcohol abuse admitted on 02/08/15 for headache, nausea vomiting, confusion, blurred vision. CT had showed left occipital ICH with small SAH, SDH and IVH. Put on 3% saline and admitted to ICU. He was then stabilized and off 3% saline. Stroke work up including MRA, CUS, 2D ehco, and EEG were negative. Repeat CT head stable. LDL 107. He was discharged to SNF after medically ready.   Follow up 05/14/15 - the patient has been doing well. He stayed in SNF until one month ago. He is currently at home with home Austin Sawyer/OT/speech 3 times per week. He still has right hemoanopia and will follow up with ophthalmology tomorrow. He also follows up with PCP next month. BP today 137/77. He is on 4 BP meds now.     Interval History During the interval time, Austin Sawyer has been doing well. Glucose at home is OK, around 80s. BP today in clinic 125/75. Wife stated that Austin Sawyer seems to have significant cognitive impairment after stroke. Can not read, write, or doing math as per wife after stroke. Austin Sawyer also repeat his questions during clinic visit. His right hemianopia seems somewhat better, he will see ophthal tomorrow for follow up. BP 125/75 today.  REVIEW OF SYSTEMS: Full 14 system review of systems performed and notable only for those listed below and in HPI above, all others are negative:  Constitutional:   Cardiovascular:  Ear/Nose/Throat:  Hearing loss Skin:  Eyes:   Respiratory:   Gastroitestinal:   Genitourinary: incontinence of bladder Hematology/Lymphatic:   Endocrine:  Musculoskeletal:  Joint pain, back pain, aching  muscles Allergy/Immunology:   Neurological: memory loss, HA Psychiatric: agitation, behavior problem, confusion, nervous and anxious Sleep:    The following represents the patient's updated allergies and side effects list: No Known Allergies  The neurologically relevant items on the patient's problem list were reviewed on today's visit.  Neurologic Examination  A problem focused neurological exam (12 or more points of the single system neurologic examination, vital signs counts as 1 point, cranial nerves count for 8 points) was performed.  Blood pressure 125/75, pulse 61, height  (1.753 m), weight 204 lb 3.2 oz (92.625 kg).  General - Well nourished, well developed, in no apparent distress.  Ophthalmologic - Fundi not visualized due to noncooperation.  Cardiovascular - Regular rate and rhythm.  Mental Status -  Awake alert and orientation to place, and person were intact, but not to time. Language exam showed fluent speech, able to follow simple commands, but pyschomotor slowing and perseveration.   Cranial Nerves II - XII - II - right homonymous hemianopia, but seems improved to see hand movement but not finger count at right visual field. III, IV, VI - eyes disconjugated (chronic) but denies double vision. V - Facial sensation intact bilaterally. VII - Facial movement intact bilaterally. VIII - Hearing & vestibular intact bilaterally. X - Palate elevates symmetrically. XI - Chin turning & shoulder shrug intact bilaterally. XII - Tongue protrusion intact.  Motor Strength - The patient's strength was normal in all extremities and pronator drift was absent.  Bulk was normal and  fasciculations were absent.   Motor Tone - Muscle tone was assessed at the neck and appendages and was normal.  Reflexes - The patient's reflexes were 1+ in all extremities and he had no pathological reflexes.  Sensory - Light touch, temperature/pinprick were assessed and were normal.     Coordination - The patient had normal movements in the hands and feet with no ataxia or dysmetria.  Tremor was absent.  Gait and Station - mildly stooped posturing, able to walk without assistance.  Data reviewed: I personally reviewed the images and agree with the radiology interpretations.  Ct Head Wo Contrast 02/16/2015 - Stable subarachnoid hemorrhage seen overlying right parietal cortex. Stable large left occipital intraparenchymal hemorrhage is noted. with surrounding white matter edema resulting in approximately 6 mL. of left to right midline shift. Stable left-sided subdural hematoma is noted. Stable bilateral lateral intraventricular hemorrhage is noted without ventricular dilatation.  02/10/2015 - 1. No significant interval change in size of left occipital lobe intraparenchymal hematoma with slightly increased associated vasogenic edema as compared to previous. Similar trace left-to-right shift. 2. Stable to slightly decreased conspicuity of bilateral small volume subarachnoid hemorrhage. 3. Stable intraventricular hemorrhage. No hydrocephalus. 4. Persistent left subdural hematoma measuring up to 3.5 mm in maximal diameter. Blood along the left tentorium is decreased in conspicuity. 5. No new intracranial abnormality.  02/09/2015 IMPRESSION: 1. Slight enlargement of large left occipital lobe intra-axial hemorrhage. Estimated hemorrhage volume now 58 mm. Stable left posterior hemisphere edema and mass effect. 2. Mild progression of subarachnoid extension, now bilateral. Extension and of a left subdural space with small broad-based left subdural hematoma not significantly changed. Stable intraventricular hemorrhage volume. No ventriculomegaly. 3. No new intracranial abnormality identified.   02/08/2015 IMPRESSION: 1. 6.7 cm left occipital parenchymal hemorrhage with mild surrounding edema. No midline shift. 2. Small amount of adjacent subarachnoid hemorrhage and small amount of  intraventricular extension of blood. 3. Small subdural hematomas over the left cerebral convexity and left tentorium.   Dg Chest Port 1 View 02/13/15 1. Stable support apparatus. 2. Improved basilar atelectasis. 02/10/15 Tube and catheter positions as described without pneumothorax. No edema or consolidation. 02/09/2015 IMPRESSION: No acute cardiopulmonary disease.   Carotid Doppler No evidence of significant stenosis in the right or left ICA. The left side was technically challenged due to line placement.   2D Echocardiogram Left ventricle: The cavity size was normal. Wall thickness was increased in a pattern of moderate LVH. Systolic function was normal. The estimated ejection fraction was in the range of 55% to 60%. Doppler parameters are consistent with elevated ventricular end-diastolic filling pressure. - Aortic valve: Poorly seen Moderately calcified mild stenosis by gradients but may be underestimated due to poor CW angle. Valve area (VTI): 2.11 cm^2. Valve area (Vmax): 2.01 cm^2. Valve area (Vmean): 1.88 cm^2. - Mitral valve: There was mild regurgitation. - Left atrium: The atrium was moderately dilated. - Impressions: Cannot r/o SOE due to extremely poor image quality. Impressions: - Cannot r/o SOE due to extremely poor image quality.  MRI and MRA  7.3 x 3.3 cm left occipital hematoma unchanged from the prior CT. There is intraventricular hemorrhage and subarachnoid hemorrhage also unchanged. There is mass-effect on the left occipital horn and 3 mm midline shift to the right. No evidence of underlying tumor or vascular malformation. This may be related to cerebral amyloid angiography. Hypertension also possible. Negative MRA circle Willis.  EEG This EEG is abnormal with moderately severe generalized continuous nonspecific slowing of cerebral activity. No  evidence of epileptic activity was recorded. The absence of epileptiform activity during an EEG  recording does not, in and of itself, rule out seizure disorder, however.  EKG NSR  Component     Latest Ref Rng 02/09/2015  Cholesterol     0 - 200 mg/dL 161  Triglycerides     <150 mg/dL 47  HDL Cholesterol     >40 mg/dL 62  Total CHOL/HDL Ratio      2.9  VLDL     0 - 40 mg/dL 9  LDL (calc)     0 - 99 mg/dL 096 (H)  Hemoglobin E4V     4.8 - 5.6 % 6.0 (H)  Mean Plasma Glucose      126  TSH     0.350 - 4.500 uIU/mL 1.154    Assessment: As you may recall, he is a 80 y.o. African American male with PMH of HTN, DM, CAD, atrial flutter on Coumadin before until 2010, alcohol abuse admitted on 02/08/15 for left occipital ICH with SAH, SDH and IVH. Recovered well with no need of surgical intervention. Stroke work up largely negative except LDL 107. Etiology still most likely HTN but CAA not able to completely ruled out. Austin Sawyer later was sent to SNF and then home. Still has significant language deficit and hemianopia. BP stable on 4 BP meds. Repeat CT showed resolution of bleed and will put on ASA . Has hx of afib on coumadin but in 2011 Dr. Antoine Poche considered his Aflutter was cured and he was then off coumadin. Austin Sawyer still has significant cognitive impairment, will need close follow up and consider cholinesterase inhibitors if persists.  Plan:  - will start ASA  for stroke prevention - continue lipitor for stroke prevention - follow up with ophthalmology for visual field monitoring - no driving due to vision change - Follow up with your primary care physician for stroke risk factor modification. Recommend maintain blood pressure goal <130/80, diabetes with hemoglobin A1c goal below 6.5% and lipids with LDL cholesterol goal below 70 mg/dL.  - check BP and glucose at home. - cognitive testing next visit. - follow up in 3 months.  I spent more than 25 minutes of face to face time with the patient. Greater than 50% of time was spent in counseling and coordination of care. We have discussed  about CT images, stroke prevention, and cognitive impairment.   No orders of the defined types were placed in this encounter.    Meds ordered this encounter  Medications  . aspirin EC 81 MG tablet    Sig: Take 1 tablet (81 mg total) by mouth daily.    Patient Instructions  - will start ASA  for stroke prevention - continue lipitor for stroke prevention - follow up with eye doctor for visual field monitoring - no driving due to vision change - Follow up with your primary care physician for stroke risk factor modification. Recommend maintain blood pressure goal <130/80, diabetes with hemoglobin A1c goal below 6.5% and lipids with LDL cholesterol goal below 70 mg/dL.  - check BP and glucose at home. - follow up in 3 months.    Marvel Plan, MD PhD [facility] [address], [address] [place], [place] [address] [phone]

## 2015-08-18 NOTE — Patient Instructions (Signed)
-   will start ASA 81mg  for stroke prevention - continue lipitor for stroke prevention - follow up with eye doctor for visual field monitoring - no driving due to vision change - Follow up with your primary care physician for stroke risk factor modification. Recommend maintain blood pressure goal <130/80, diabetes with hemoglobin A1c goal below 6.5% and lipids with LDL cholesterol goal below 70 mg/dL.  - check BP and glucose at home. - follow up in 3 months.

## 2015-08-19 DIAGNOSIS — R4189 Other symptoms and signs involving cognitive functions and awareness: Secondary | ICD-10-CM | POA: Insufficient documentation

## 2015-08-19 MED ORDER — ASPIRIN EC 81 MG PO TBEC: 81.0000 mg | DELAYED_RELEASE_TABLET | Freq: Every day | ORAL | Status: DC

## 2015-08-22 DIAGNOSIS — I69319 Unspecified symptoms and signs involving cognitive functions following cerebral infarction: Secondary | ICD-10-CM | POA: Diagnosis not present

## 2015-08-22 DIAGNOSIS — F0150 Vascular dementia without behavioral disturbance: Secondary | ICD-10-CM | POA: Diagnosis not present

## 2015-08-22 DIAGNOSIS — E119 Type 2 diabetes mellitus without complications: Secondary | ICD-10-CM | POA: Diagnosis not present

## 2015-08-22 DIAGNOSIS — I110 Hypertensive heart disease with heart failure: Secondary | ICD-10-CM | POA: Diagnosis not present

## 2015-08-22 DIAGNOSIS — F0280 Dementia in other diseases classified elsewhere without behavioral disturbance: Secondary | ICD-10-CM | POA: Diagnosis not present

## 2015-08-22 DIAGNOSIS — G309 Alzheimer's disease, unspecified: Secondary | ICD-10-CM | POA: Diagnosis not present

## 2015-09-09 DIAGNOSIS — I69319 Unspecified symptoms and signs involving cognitive functions following cerebral infarction: Secondary | ICD-10-CM | POA: Diagnosis not present

## 2015-09-09 DIAGNOSIS — F0280 Dementia in other diseases classified elsewhere without behavioral disturbance: Secondary | ICD-10-CM | POA: Diagnosis not present

## 2015-09-09 DIAGNOSIS — G309 Alzheimer's disease, unspecified: Secondary | ICD-10-CM | POA: Diagnosis not present

## 2015-09-09 DIAGNOSIS — F0150 Vascular dementia without behavioral disturbance: Secondary | ICD-10-CM | POA: Diagnosis not present

## 2015-09-09 DIAGNOSIS — I110 Hypertensive heart disease with heart failure: Secondary | ICD-10-CM | POA: Diagnosis not present

## 2015-09-09 DIAGNOSIS — E119 Type 2 diabetes mellitus without complications: Secondary | ICD-10-CM | POA: Diagnosis not present

## 2015-11-24 ENCOUNTER — Ambulatory Visit (INDEPENDENT_AMBULATORY_CARE_PROVIDER_SITE_OTHER): Payer: Medicare Other | Admitting: Neurology

## 2015-11-24 ENCOUNTER — Encounter: Payer: Self-pay | Admitting: Neurology

## 2015-11-24 VITALS — BP 120/63 | HR 57 | Ht 69.0 in | Wt 209.4 lb

## 2015-11-24 DIAGNOSIS — E1159 Type 2 diabetes mellitus with other circulatory complications: Secondary | ICD-10-CM

## 2015-11-24 DIAGNOSIS — I10 Essential (primary) hypertension: Secondary | ICD-10-CM | POA: Diagnosis not present

## 2015-11-24 DIAGNOSIS — I4892 Unspecified atrial flutter: Secondary | ICD-10-CM | POA: Insufficient documentation

## 2015-11-24 DIAGNOSIS — I611 Nontraumatic intracerebral hemorrhage in hemisphere, cortical: Secondary | ICD-10-CM | POA: Diagnosis not present

## 2015-11-24 DIAGNOSIS — F0150 Vascular dementia without behavioral disturbance: Secondary | ICD-10-CM | POA: Insufficient documentation

## 2015-11-24 NOTE — Progress Notes (Signed)
STROKE NEUROLOGY FOLLOW UP NOTE  NAME: Austin Sawyer DOB: [birth year]  REASON FOR VISIT: stroke follow up HISTORY FROM: chart and pt and wife  Today we had the pleasure of seeing Austin Sawyer in follow-up at our Neurology Clinic. Pt was accompanied by wife.   History Summary Austin Sawyer is a 80 y.o. male with history of HTN, DM, CAD, atrial flutter on Coumadin before until 2011, alcohol abuse admitted on 02/08/15 for headache, nausea vomiting, confusion, blurred vision. CT had showed left occipital ICH with small SAH, SDH and IVH. Put on 3% saline and admitted to ICU. He was then stabilized and off 3% saline. Stroke work up including MRA, CUS, 2D ehco, and EEG were negative. Repeat CT head stable. LDL 107. He was discharged to SNF after medically ready.   Follow up 05/14/15 - the patient has been doing well. He stayed in SNF until one month ago. He is currently at home with home PT/OT/speech 3 times per week. He still has right hemoanopia and will follow up with ophthalmology tomorrow. He also follows up with PCP next month. BP today 137/77. He is on 4 BP meds now.     Follow up 08/18/15 - pt has been doing well. Glucose at home is OK, around 80s. BP today in clinic 125/75. Wife stated that pt seems to have significant cognitive impairment after stroke. Can not read, write, or doing math as per wife after stroke. Pt also repeat his questions during clinic visit. His right hemianopia seems somewhat better, he will see ophthal tomorrow for follow up. BP 125/75 today.  Interval History During the interval time, pt has been doing the same. Had ophthalmology follow up and had new prescription glasses. No significant visual field changes, still has right hemianopia. Wife still reports cognitive impairment.   REVIEW OF SYSTEMS: Full 14 system review of systems performed and notable only for those listed below and in HPI above, all others are negative:  Constitutional:   Cardiovascular:    Ear/Nose/Throat:   Skin:  Eyes:   Respiratory:   Gastroitestinal:   Genitourinary:  Hematology/Lymphatic:   Endocrine:  Musculoskeletal:  Allergy/Immunology:   Neurological:  Psychiatric:  Sleep:    The following represents the patient's updated allergies and side effects list: No Known Allergies  The neurologically relevant items on the patient's problem list were reviewed on today's visit.  Neurologic Examination  A problem focused neurological exam (12 or more points of the single system neurologic examination, vital signs counts as 1 point, cranial nerves count for 8 points) was performed.  Blood pressure 120/63, pulse 57, height 5\' 9"  (1.753 m), weight 209 lb 6.4 oz (94.983 kg).  General - Well nourished, well developed, in no apparent distress.  Ophthalmologic - Fundi not visualized due to noncooperation.  Cardiovascular - Regular rate and rhythm.  Mental Status -  Awake alert, orientated to month and people, but not place, year, or date.  Language exam showed fluent speech, able to follow some simple commands, but still has difficulty with comprehension, naming and repetition. Has moderate perseveration.   mini-mental status exam Orientation to time - 1/5 Orientation to place - 0/5 Registration - 3/3 Attention - 0/5 Delayed recall - 0/3 Naming - 1/2 Repetition - 0/1 Comprehension - 1/3 Reading - 0/1 Writing - 0/1 Visuospatial - 0/1 Total 6/30  Cranial Nerves II - XII - II - right homonymous hemianopia. III, IV, VI - eyes disconjugated (chronic) but denies double vision. V - Facial sensation  intact bilaterally. VII - Facial movement intact bilaterally. VIII - Hearing & vestibular intact bilaterally. X - Palate elevates symmetrically. XI - Chin turning & shoulder shrug intact bilaterally. XII - Tongue protrusion intact.  Motor Strength - The patient's strength was normal in all extremities and pronator drift was absent.  Bulk was normal and  fasciculations were absent.   Motor Tone - Muscle tone was assessed at the neck and appendages and was normal.  Reflexes - The patient's reflexes were 1+ in all extremities and he had no pathological reflexes.  Sensory - Light touch, temperature/pinprick were assessed and were normal.    Coordination - The patient had normal movements in the hands and feet with no ataxia or dysmetria.  Tremor was absent.  Gait and Station - mildly stooped posturing, able to walk without assistance.  Data reviewed: I personally reviewed the images and agree with the radiology interpretations.  Ct Head Wo Contrast 02/16/2015 - Stable subarachnoid hemorrhage seen overlying right parietal cortex. Stable large left occipital intraparenchymal hemorrhage is noted. with surrounding white matter edema resulting in approximately 6 mL. of left to right midline shift. Stable left-sided subdural hematoma is noted. Stable bilateral lateral intraventricular hemorrhage is noted without ventricular dilatation.  02/10/2015 - 1. No significant interval change in size of left occipital lobe intraparenchymal hematoma with slightly increased associated vasogenic edema as compared to previous. Similar trace left-to-right shift. 2. Stable to slightly decreased conspicuity of bilateral small volume subarachnoid hemorrhage. 3. Stable intraventricular hemorrhage. No hydrocephalus. 4. Persistent left subdural hematoma measuring up to 3.5 mm in maximal diameter. Blood along the left tentorium is decreased in conspicuity. 5. No new intracranial abnormality.  02/09/2015 IMPRESSION: 1. Slight enlargement of large left occipital lobe intra-axial hemorrhage. Estimated hemorrhage volume now 58 mm. Stable left posterior hemisphere edema and mass effect. 2. Mild progression of subarachnoid extension, now bilateral. Extension and of a left subdural space with small broad-based left subdural hematoma not significantly changed. Stable  intraventricular hemorrhage volume. No ventriculomegaly. 3. No new intracranial abnormality identified.   02/08/2015 IMPRESSION: 1. 6.7 cm left occipital parenchymal hemorrhage with mild surrounding edema. No midline shift. 2. Small amount of adjacent subarachnoid hemorrhage and small amount of intraventricular extension of blood. 3. Small subdural hematomas over the left cerebral convexity and left tentorium.   Dg Chest Port 1 View 02/13/15 1. Stable support apparatus. 2. Improved basilar atelectasis. 02/10/15 Tube and catheter positions as described without pneumothorax. No edema or consolidation. 02/09/2015 IMPRESSION: No acute cardiopulmonary disease.   Carotid Doppler No evidence of significant stenosis in the right or left ICA. The left side was technically challenged due to line placement.   2D Echocardiogram Left ventricle: The cavity size was normal. Wall thickness was increased in a pattern of moderate LVH. Systolic function was normal. The estimated ejection fraction was in the range of 55% to 60%. Doppler parameters are consistent with elevated ventricular end-diastolic filling pressure. - Aortic valve: Poorly seen Moderately calcified mild stenosis by gradients but may be underestimated due to poor CW angle. Valve area (VTI): 2.11 cm^2. Valve area (Vmax): 2.01 cm^2. Valve area (Vmean): 1.88 cm^2. - Mitral valve: There was mild regurgitation. - Left atrium: The atrium was moderately dilated. - Impressions: Cannot r/o SOE due to extremely poor image quality. Impressions: - Cannot r/o SOE due to extremely poor image quality.  MRI and MRA  7.3 x 3.3 cm left occipital hematoma unchanged from the prior CT. There is intraventricular hemorrhage and subarachnoid hemorrhage also unchanged.  There is mass-effect on the left occipital horn and 3 mm midline shift to the right. No evidence of underlying tumor or vascular malformation. This may be related to cerebral  amyloid angiography. Hypertension also possible. Negative MRA circle Willis.  EEG This EEG is abnormal with moderately severe generalized continuous nonspecific slowing of cerebral activity. No evidence of epileptic activity was recorded. The absence of epileptiform activity during an EEG recording does not, in and of itself, rule out seizure disorder, however.  EKG NSR  Component     Latest Ref Rng 02/09/2015  Cholesterol     0 - 200 mg/dL 161  Triglycerides     <150 mg/dL 47  HDL Cholesterol     >40 mg/dL 62  Total CHOL/HDL Ratio      2.9  VLDL     0 - 40 mg/dL 9  LDL (calc)     0 - 99 mg/dL 096 (H)  Hemoglobin E4V     4.8 - 5.6 % 6.0 (H)  Mean Plasma Glucose      126  TSH     0.350 - 4.500 uIU/mL 1.154    Assessment: As you may recall, he is a 81 y.o. African American male with PMH of HTN, DM, CAD, atrial flutter on Coumadin before until 2010, alcohol abuse admitted on 02/08/15 for left occipital ICH with SAH, SDH and IVH. Recovered well with no need of surgical intervention. Stroke work up largely negative except LDL 107. Etiology still most likely HTN but CAA not able to completely ruled out. Pt later was sent to SNF and then home. Still has significant language deficit and hemianopia. BP stable on 4 BP meds. Repeat CT showed resolution of bleed and will put on ASA . Has hx of afib on coumadin but in 2011 Dr. Antoine Poche considered his Aflutter was cured and he was then off coumadin. Pt still has significant language and cognitive impairment, and MMSE 6/30. More consistent with vascular dementia. Will still need speech home assessment and treatment.  Plan:  - continue ASA 81 and lipitor mg for stroke prevention - follow up with eye doctor for visual field monitoring - no driving due to vision change - Follow up with your primary care physician for stroke risk factor modification. Recommend maintain blood pressure goal <130/80, diabetes with hemoglobin A1c goal below 6.5% and  lipids with LDL cholesterol goal below 70 mg/dL.  - check BP and glucose at home. - home speech therapy and cognitive assessment.  - follow up in 6 months.  I spent more than 25 minutes of face to face time with the patient. Greater than 50% of time was spent in counseling and coordination of care. We have discussed about speech, language and cognitive impairment, and further home health therapy.   No orders of the defined types were placed in this encounter.    No orders of the defined types were placed in this encounter.    Patient Instructions  - continue ASA 81 and lipitor mg for stroke prevention - follow up with eye doctor for visual field monitoring - no driving due to vision change - Follow up with your primary care physician for stroke risk factor modification. Recommend maintain blood pressure goal <130/80, diabetes with hemoglobin A1c goal below 6.5% and lipids with LDL cholesterol goal below 70 mg/dL.  - check BP and glucose at home. - speech therapy and cognitive assessment.  - follow up in 6 months.    Marvel Plan, MD PhD [facility] [address], [place], [place] [address] [phone]

## 2015-11-24 NOTE — Patient Instructions (Addendum)
-   continue ASA 81 and lipitor mg for stroke prevention - follow up with eye doctor for visual field monitoring - no driving due to vision change - Follow up with your primary care physician for stroke risk factor modification. Recommend maintain blood pressure goal <130/80, diabetes with hemoglobin A1c goal below 6.5% and lipids with LDL cholesterol goal below 70 mg/dL.  - check BP and glucose at home. - speech therapy and cognitive assessment.  - follow up in 6 months.

## 2015-11-27 ENCOUNTER — Telehealth: Payer: Self-pay | Admitting: Neurology

## 2015-11-27 NOTE — Telephone Encounter (Signed)
Barbara/[facility] [phone] called regarding order for Speech Therapy, is advising of delay of care due to Speech Therapist sick today, will visit patient tomorrow.

## 2015-11-30 ENCOUNTER — Emergency Department (HOSPITAL_COMMUNITY)
Admission: EM | Admit: 2015-11-30 | Discharge: 2015-11-30 | Disposition: A | Discharge status: Home / Self Care | Payer: Medicare Other | Attending: Emergency Medicine | Admitting: Emergency Medicine

## 2015-11-30 ENCOUNTER — Emergency Department (HOSPITAL_COMMUNITY): Payer: Medicare Other

## 2015-11-30 ENCOUNTER — Encounter (HOSPITAL_COMMUNITY): Payer: Self-pay | Admitting: Emergency Medicine

## 2015-11-30 DIAGNOSIS — H9190 Unspecified hearing loss, unspecified ear: Secondary | ICD-10-CM | POA: Insufficient documentation

## 2015-11-30 DIAGNOSIS — Z79899 Other long term (current) drug therapy: Secondary | ICD-10-CM | POA: Diagnosis not present

## 2015-11-30 DIAGNOSIS — I2510 Atherosclerotic heart disease of native coronary artery without angina pectoris: Secondary | ICD-10-CM | POA: Diagnosis not present

## 2015-11-30 DIAGNOSIS — I252 Old myocardial infarction: Secondary | ICD-10-CM | POA: Insufficient documentation

## 2015-11-30 DIAGNOSIS — M545 Low back pain, unspecified: Secondary | ICD-10-CM

## 2015-11-30 DIAGNOSIS — Z87891 Personal history of nicotine dependence: Secondary | ICD-10-CM | POA: Insufficient documentation

## 2015-11-30 DIAGNOSIS — I10 Essential (primary) hypertension: Secondary | ICD-10-CM | POA: Insufficient documentation

## 2015-11-30 DIAGNOSIS — M549 Dorsalgia, unspecified: Secondary | ICD-10-CM | POA: Diagnosis not present

## 2015-11-30 DIAGNOSIS — Z7982 Long term (current) use of aspirin: Secondary | ICD-10-CM | POA: Insufficient documentation

## 2015-11-30 DIAGNOSIS — K219 Gastro-esophageal reflux disease without esophagitis: Secondary | ICD-10-CM | POA: Diagnosis not present

## 2015-11-30 DIAGNOSIS — Z8673 Personal history of transient ischemic attack (TIA), and cerebral infarction without residual deficits: Secondary | ICD-10-CM | POA: Diagnosis not present

## 2015-11-30 DIAGNOSIS — Z7984 Long term (current) use of oral hypoglycemic drugs: Secondary | ICD-10-CM | POA: Diagnosis not present

## 2015-11-30 DIAGNOSIS — I4892 Unspecified atrial flutter: Secondary | ICD-10-CM | POA: Diagnosis not present

## 2015-11-30 DIAGNOSIS — F0390 Unspecified dementia without behavioral disturbance: Secondary | ICD-10-CM | POA: Diagnosis not present

## 2015-11-30 DIAGNOSIS — E119 Type 2 diabetes mellitus without complications: Secondary | ICD-10-CM | POA: Insufficient documentation

## 2015-11-30 DIAGNOSIS — F329 Major depressive disorder, single episode, unspecified: Secondary | ICD-10-CM | POA: Diagnosis not present

## 2015-11-30 HISTORY — DX: Dorsalgia, unspecified: M54.9

## 2015-11-30 HISTORY — DX: Unspecified dementia, unspecified severity, without behavioral disturbance, psychotic disturbance, mood disturbance, and anxiety: F03.90

## 2015-11-30 MED ORDER — HYDROCODONE-ACETAMINOPHEN 5-325 MG PO TABS: 1.0000 | ORAL_TABLET | Freq: Four times a day (QID) | ORAL | Status: DC | PRN

## 2015-11-30 MED ORDER — HYDROCODONE-ACETAMINOPHEN 5-325 MG PO TABS
1.0000 | ORAL_TABLET | Freq: Once | ORAL | Status: AC
  Administered 2015-11-30: 1 via ORAL
  Filled 2015-11-30: qty 1
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
## 2015-11-30 NOTE — Discharge Instructions (Signed)
Take the Vicodin for pain if Tylenol alone does not help it. Follow-up with your doctor within next month

## 2015-11-30 NOTE — ED Provider Notes (Signed)
CSN: [id number]     Arrival date & time 11/30/15  0930 History   First MD Initiated Contact with Patient 11/30/15 [phone]     Chief Complaint  Patient presents with  . Back Pain     (Consider location/radiation/quality/duration/timing/severity/associated sxs/prior Treatment) Patient is a 81 y.o. male presenting with back pain. The history is provided by the patient (Patient complains of lower back pain for months).  Back Pain Location:  Lumbar spine Quality:  Aching Radiates to:  Does not radiate Pain severity:  Moderate Onset quality:  Gradual Timing:  Constant Progression:  Waxing and waning Chronicity:  Recurrent Context: not emotional stress   Associated symptoms: no abdominal pain, no chest pain and no headaches     Past Medical History  Diagnosis Date  . Hypertension   . Coronary artery disease   . Diabetes mellitus without complication (HCC)   . MI, old   . Reflux   . Depression   . Cervicalgia   . Disturbance of skin sensation   . Coronary atherosclerosis of unspecified type of vessel, native or graft   . Unspecified hearing loss   . Unspecified sleep apnea   . Alcohol abuse, in remission   . Atrial flutter (HCC)   . Stroke (HCC)   . Dementia   . Back pain    Past Surgical History  Procedure Laterality Date  . Hernia repair    . Rotator cuff repair     Family History  Problem Relation Age of Onset  . Anxiety disorder Mother   . Cancer Mother    Social History  Substance Use Topics  . Smoking status: Former Games developer  . Smokeless tobacco: Never Used  . Alcohol Use: No    Review of Systems  Constitutional: Negative for appetite change and fatigue.  HENT: Negative for congestion, ear discharge and sinus pressure.   Eyes: Negative for discharge.  Respiratory: Negative for cough.   Cardiovascular: Negative for chest pain.  Gastrointestinal: Negative for abdominal pain and diarrhea.  Genitourinary: Negative for frequency and hematuria.  Musculoskeletal:  Positive for back pain.  Skin: Negative for rash.  Neurological: Negative for seizures and headaches.  Psychiatric/Behavioral: Negative for hallucinations.      Allergies  Review of patient's allergies indicates no known allergies.  Home Medications   Prior to Admission medications   Medication Sig Start Date End Date Taking? Authorizing Provider  acetaminophen (TYLENOL) 325 MG tablet Take 2 tablets (650 mg total) by mouth every 4 (four) hours as needed for mild pain (or temp > 99 F). 02/21/15  Yes David L Rinehuls, PA-C  albuterol (PROVENTIL) (2.5 MG/3ML) 0.083% nebulizer solution Take 3 mLs (2.5 mg total) by nebulization 2 (two) times daily as needed for wheezing or shortness of breath. 02/21/15  Yes David L Rinehuls, PA-C  allopurinol (ZYLOPRIM) 300 MG tablet Take 300 mg by mouth daily.   Yes Historical Provider, MD  amLODipine (NORVASC) 5 MG tablet Take 1 tablet (5 mg total) by mouth daily. 02/21/15  Yes David L Rinehuls, PA-C  aspirin EC 81 MG tablet Take 1 tablet (81 mg total) by mouth daily. 08/19/15  Yes Marvel Plan, MD  atorvastatin (LIPITOR) 10 MG tablet Take 1 tablet (10 mg total) by mouth daily at 6 PM. 02/21/15  Yes David L Rinehuls, PA-C  carvedilol (COREG) 25 MG tablet Take 1 tablet (25 mg total) by mouth 2 (two) times daily with a meal. 02/21/15  Yes David L Rinehuls, PA-C  DULoxetine 40 MG CPEP  Take 40 mg by mouth daily. 02/21/15  Yes David L Rinehuls, PA-C  gabapentin (NEURONTIN) 300 MG capsule Take 300 mg by mouth daily.   Yes Historical Provider, MD  hydrALAZINE (APRESOLINE) 50 MG tablet Take 1 tablet (50 mg total) by mouth every 8 (eight) hours. 02/21/15  Yes David L Rinehuls, PA-C  l-methylfolate-B6-B12 (METANX) 3-35-2 MG TABS Take 1 tablet by mouth daily.   Yes Historical Provider, MD  lisinopril (PRINIVIL,ZESTRIL) 20 MG tablet Take 1 tablet (20 mg total) by mouth 2 (two) times daily. 02/21/15  Yes David L Rinehuls, PA-C  metFORMIN (GLUCOPHAGE) 500 MG tablet Take 500 mg by  mouth 2 (two) times daily with a meal.    Yes Historical Provider, MD  mirtazapine (REMERON) 15 MG tablet Take 15 mg by mouth at bedtime.  05/06/15  Yes Historical Provider, MD  Multiple Vitamin (MULTIVITAMIN WITH MINERALS) TABS tablet Take 1 tablet by mouth daily. 02/21/15  Yes David L Rinehuls, PA-C  nitroGLYCERIN (NITROSTAT) 0.4 MG SL tablet Place 0.4 mg under the tongue every 5 (five) minutes as needed. Chest pain   Yes Historical Provider, MD  omeprazole (PRILOSEC) 20 MG capsule Take 20 mg by mouth daily.   Yes Historical Provider, MD  QUEtiapine (SEROQUEL) 25 MG tablet Take 1 tablet (25 mg total) by mouth at bedtime. 02/21/15  Yes David L Rinehuls, PA-C  tamsulosin (FLOMAX) 0.4 MG CAPS capsule Take 0.4 mg by mouth.   Yes Historical Provider, MD  HYDROcodone-acetaminophen (NORCO/VICODIN) 5-325 MG tablet Take 1 tablet by mouth every 6 (six) hours as needed for moderate pain. 11/30/15   Bethann Berkshire, MD   BP 142/76 mmHg  Pulse 50  Temp(Src) 98.7 F (37.1 C) (Oral)  Resp 16  SpO2 100% Physical Exam  Constitutional: He is oriented to person, place, and time. He appears well-developed.  HENT:  Head: Normocephalic.  Eyes: Conjunctivae and EOM are normal. No scleral icterus.  Neck: Neck supple. No thyromegaly present.  Cardiovascular: Normal rate and regular rhythm.  Exam reveals no gallop and no friction rub.   No murmur heard. Pulmonary/Chest: No stridor. He has no wheezes. He has no rales. He exhibits no tenderness.  Abdominal: He exhibits no distension. There is no tenderness. There is no rebound.  Musculoskeletal: Normal range of motion. He exhibits tenderness. He exhibits no edema.  Mild to moderate tenderness  lower lumbar spine  Lymphadenopathy:    He has no cervical adenopathy.  Neurological: He is oriented to person, place, and time. He displays normal reflexes. He exhibits normal muscle tone. Coordination normal.  Straight leg raise negative  Skin: No rash noted. No erythema.   Psychiatric: He has a normal mood and affect. His behavior is normal.    ED Course  Procedures (including critical care time) Labs Review Labs Reviewed - No data to display  Imaging Review Dg Lumbar Spine Complete  11/30/2015  CLINICAL DATA:  Back injury years ago. Worsening back pain recently. EXAM: LUMBAR SPINE - COMPLETE 4+ VIEW COMPARISON:  02/15/2015 FINDINGS: diffuse degenerative facet disease throughout the lumbar spine. Degenerative disc disease also diffuse, most pronounced at L4-5 and L5-S1 with disc space narrowing and spurring. No fracture. SI joints are symmetric and unremarkable. IMPRESSION: Degenerative changes.  No acute findings. Electronically Signed   By: Charlett Nose M.D.   On: 11/30/2015 10:43   I have personally reviewed and evaluated these images and lab results as part of my medical decision-making.   EKG Interpretation None  MDM   Final diagnoses:  Back pain at L4-L5 level    Patient with degenerative changes of lumbar spine.Marland Kitchen He will take Tylenol for pain and if that doesn't help he has been given a prescription of Vicodin he'll follow-up with PCP in a month    Bethann Berkshire, MD 11/30/15 1110

## 2015-12-01 NOTE — Telephone Encounter (Signed)
Rn call Britta Mccreedy for a verbal order for speech from Dr.Xu. Order will be fax over for Dr.Xu signature.

## 2015-12-02 ENCOUNTER — Telehealth: Payer: Self-pay | Admitting: Neurology

## 2015-12-02 NOTE — Telephone Encounter (Signed)
[name]/[facility] [phone] called to request verbal order for skilled Speech Therapy, cognitive communication abilities to week 4.

## 2015-12-02 NOTE — Telephone Encounter (Signed)
Lft message that order was given already for speech therapy.

## 2015-12-03 ENCOUNTER — Telehealth: Payer: Self-pay | Admitting: Neurology

## 2015-12-03 NOTE — Telephone Encounter (Signed)
Rn call Shanda Bumps about pts 2 medications Coreg and clonidine. Shanda Bumps stated she wanted to tell the PCP about the interaction of the two meds. Rn explain Dr. Roda Shutters is not the PCP, he is the patients neurologist. Shanda Bumps stated she understood.Rn also stated the medications were not prescribed by Dr. Roda Shutters.. Rn stated a FYI can be sent to Dr. Roda Shutters about the interaction.Marland Kitchen

## 2015-12-03 NOTE — Telephone Encounter (Signed)
Shanda Bumps a speech therapist with [facility] is calling regarding the patient. She wants to discuss the interaction with medications carvedilol (COREG) 25 MG tablet and Clonidine.

## 2015-12-03 NOTE — Telephone Encounter (Signed)
Lft vm for Austin Sawyer that Dr.Xu did not prescribed the medications. Rn stated on vm to call back to discuss her concerns for interaction. Contact number left.

## 2015-12-22 ENCOUNTER — Telehealth: Payer: Self-pay | Admitting: Neurology

## 2015-12-22 NOTE — Telephone Encounter (Signed)
[facility] order for extension of services- effect 12/29/15 speech therapy to address  language abilities 2 x week 3 week, 1 x  week 1 week. May call phone: [phone].

## 2015-12-22 NOTE — Telephone Encounter (Signed)
Rn call Shanda BumpsJessica back to give verbal order for speech therapy effective 12/29/2015.

## 2016-01-07 ENCOUNTER — Telehealth: Payer: Self-pay | Admitting: Neurology

## 2016-01-07 NOTE — Telephone Encounter (Signed)
Rn gave verbal order for speech therapy for Austin Sawyer. Rn stated the pain medicine was issue by another provider and he needs to just take the medicine as prescribed. Austin Sawyer verbalized understanding.

## 2016-01-07 NOTE — Telephone Encounter (Signed)
[name] : pt reporting pain 7 out of 10 in left arm , fingers to shoulder. He is also complaining of a slight headache. Pt did take some pain medication, HYDROcodone-acetaminophen (NORCO/VICODIN) 5-325 MG tablet. Shanda BumpsJessica needs a verbal order for OT 1 week 1, to address upper body extremities for assessment of function. Please call [phone]

## 2016-01-09 ENCOUNTER — Telehealth: Payer: Self-pay | Admitting: Neurology

## 2016-01-09 ENCOUNTER — Emergency Department (HOSPITAL_COMMUNITY)
Admission: EM | Admit: 2016-01-09 | Discharge: 2016-01-09 | Disposition: A | Discharge status: Home / Self Care | Payer: Medicare Other | Attending: Emergency Medicine | Admitting: Emergency Medicine

## 2016-01-09 ENCOUNTER — Emergency Department (HOSPITAL_COMMUNITY): Payer: Medicare Other

## 2016-01-09 ENCOUNTER — Encounter (HOSPITAL_COMMUNITY): Payer: Self-pay | Admitting: Emergency Medicine

## 2016-01-09 DIAGNOSIS — Z7982 Long term (current) use of aspirin: Secondary | ICD-10-CM | POA: Diagnosis not present

## 2016-01-09 DIAGNOSIS — Z87891 Personal history of nicotine dependence: Secondary | ICD-10-CM | POA: Diagnosis not present

## 2016-01-09 DIAGNOSIS — F0390 Unspecified dementia without behavioral disturbance: Secondary | ICD-10-CM | POA: Insufficient documentation

## 2016-01-09 DIAGNOSIS — R42 Dizziness and giddiness: Secondary | ICD-10-CM | POA: Insufficient documentation

## 2016-01-09 DIAGNOSIS — F329 Major depressive disorder, single episode, unspecified: Secondary | ICD-10-CM | POA: Diagnosis not present

## 2016-01-09 DIAGNOSIS — I2510 Atherosclerotic heart disease of native coronary artery without angina pectoris: Secondary | ICD-10-CM | POA: Diagnosis not present

## 2016-01-09 DIAGNOSIS — I10 Essential (primary) hypertension: Secondary | ICD-10-CM

## 2016-01-09 DIAGNOSIS — R4182 Altered mental status, unspecified: Secondary | ICD-10-CM | POA: Diagnosis not present

## 2016-01-09 DIAGNOSIS — Z79899 Other long term (current) drug therapy: Secondary | ICD-10-CM | POA: Diagnosis not present

## 2016-01-09 DIAGNOSIS — H9190 Unspecified hearing loss, unspecified ear: Secondary | ICD-10-CM | POA: Insufficient documentation

## 2016-01-09 DIAGNOSIS — K219 Gastro-esophageal reflux disease without esophagitis: Secondary | ICD-10-CM | POA: Insufficient documentation

## 2016-01-09 DIAGNOSIS — R531 Weakness: Secondary | ICD-10-CM | POA: Diagnosis not present

## 2016-01-09 DIAGNOSIS — Z7984 Long term (current) use of oral hypoglycemic drugs: Secondary | ICD-10-CM | POA: Diagnosis not present

## 2016-01-09 DIAGNOSIS — I252 Old myocardial infarction: Secondary | ICD-10-CM | POA: Diagnosis not present

## 2016-01-09 DIAGNOSIS — Z8673 Personal history of transient ischemic attack (TIA), and cerebral infarction without residual deficits: Secondary | ICD-10-CM | POA: Insufficient documentation

## 2016-01-09 DIAGNOSIS — E119 Type 2 diabetes mellitus without complications: Secondary | ICD-10-CM | POA: Insufficient documentation

## 2016-01-09 DIAGNOSIS — I4891 Unspecified atrial fibrillation: Secondary | ICD-10-CM | POA: Diagnosis not present

## 2016-01-09 LAB — CBC
HEMATOCRIT: 40.9 % (ref 39.0–52.0)
Hemoglobin: 13.1 g/dL (ref 13.0–17.0)
MCH: 28.4 pg (ref 26.0–34.0)
MCHC: 32 g/dL (ref 30.0–36.0)
MCV: 88.7 fL (ref 78.0–100.0)
PLATELETS: 161 10*3/uL (ref 150–400)
RBC: 4.61 MIL/uL (ref 4.22–5.81)
RDW: 14.6 % (ref 11.5–15.5)
WBC: 7.7 10*3/uL (ref 4.0–10.5)

## 2016-01-09 LAB — COMPREHENSIVE METABOLIC PANEL
ALBUMIN: 3.8 g/dL (ref 3.5–5.0)
ALT: 15 U/L — ABNORMAL LOW (ref 17–63)
ANION GAP: 9 (ref 5–15)
AST: 18 U/L (ref 15–41)
Alkaline Phosphatase: 54 U/L (ref 38–126)
BILIRUBIN TOTAL: 0.8 mg/dL (ref 0.3–1.2)
BUN: 11 mg/dL (ref 6–20)
CALCIUM: 9.1 mg/dL (ref 8.9–10.3)
CO2: 28 mmol/L (ref 22–32)
Chloride: 104 mmol/L (ref 101–111)
Creatinine, Ser: 1.03 mg/dL (ref 0.61–1.24)
GFR calc Af Amer: >60 mL/min (ref >60)
GFR calc non Af Amer: >60 mL/min (ref >60)
Glucose, Bld: 106 mg/dL — ABNORMAL HIGH (ref 65–99)
POTASSIUM: 4.1 mmol/L (ref 3.5–5.1)
SODIUM: 141 mmol/L (ref 135–145)
TOTAL PROTEIN: 7.1 g/dL (ref 6.5–8.1)

## 2016-01-09 LAB — DIFFERENTIAL
BASOS ABS: 0 10*3/uL (ref 0.0–0.1)
Basophils Relative: 1 %
EOS ABS: 0.3 10*3/uL (ref 0.0–0.7)
EOS PCT: 4 %
LYMPHS ABS: 1.3 10*3/uL (ref 0.7–4.0)
Lymphocytes Relative: 17 %
Monocytes Absolute: 0.5 10*3/uL (ref 0.1–1.0)
Monocytes Relative: 6 %
NEUTROS PCT: 72 %
Neutro Abs: 5.6 10*3/uL (ref 1.7–7.7)

## 2016-01-09 LAB — URINALYSIS, ROUTINE W REFLEX MICROSCOPIC
BILIRUBIN URINE: NEGATIVE
Glucose, UA: NEGATIVE mg/dL
HGB URINE DIPSTICK: NEGATIVE
Ketones, ur: NEGATIVE mg/dL
Leukocytes, UA: NEGATIVE
Nitrite: NEGATIVE
Protein, ur: 30 mg/dL — AB
SPECIFIC GRAVITY, URINE: 1.01 (ref 1.005–1.030)
pH: 7 (ref 5.0–8.0)

## 2016-01-09 LAB — I-STAT CHEM 8, ED
BUN: 13 mg/dL (ref 6–20)
CALCIUM ION: 1.16 mmol/L (ref 1.13–1.30)
CHLORIDE: 103 mmol/L (ref 101–111)
Creatinine, Ser: 0.9 mg/dL (ref 0.61–1.24)
GLUCOSE: 98 mg/dL (ref 65–99)
HCT: 45 % (ref 39.0–52.0)
HEMOGLOBIN: 15.3 g/dL (ref 13.0–17.0)
Potassium: 4.1 mmol/L (ref 3.5–5.1)
Sodium: 143 mmol/L (ref 135–145)
TCO2: 28 mmol/L (ref 0–100)

## 2016-01-09 LAB — I-STAT TROPONIN, ED: TROPONIN I, POC: 0 ng/mL (ref 0.00–0.08)

## 2016-01-09 LAB — URINE MICROSCOPIC-ADD ON: RBC / HPF: NONE SEEN RBC/hpf (ref 0–5)

## 2016-01-09 LAB — PROTIME-INR
INR: 1.08 (ref 0.00–1.49)
Prothrombin Time: 14.2 seconds (ref 11.6–15.2)

## 2016-01-09 LAB — CBG MONITORING, ED: GLUCOSE-CAPILLARY: 100 mg/dL — AB (ref 65–99)

## 2016-01-09 LAB — APTT: aPTT: 32 seconds (ref 24–37)

## 2016-01-09 NOTE — ED Notes (Addendum)
Family reports that pt woke up today c/o feeling sick. Reports pt felt like he was going to vomit and being sleepy. Visiting nurse came out and checked pt BP, noted it was elevated 188/102, 200/114, 208/94. Per visitor pt is not acting like his normal self. Pt has history of Dementia but is more confused than normal.

## 2016-01-09 NOTE — ED Notes (Signed)
MD Zackowski at the bedside  

## 2016-01-09 NOTE — ED Notes (Signed)
Md made aware of patient's neuro exam.

## 2016-01-09 NOTE — ED Provider Notes (Signed)
CSN: [id number]     Arrival date & time 01/09/16  1005 History   First MD Initiated Contact with Patient 01/09/16 1108     Chief Complaint  Patient presents with  . Hypertension     (Consider location/radiation/quality/duration/timing/severity/associated sxs/prior Treatment) Patient is a 81 y.o. male presenting with hypertension. The history is provided by the patient.  Hypertension Pertinent negatives include no chest pain, no abdominal pain, no headaches and no shortness of breath.  Patient brought in by family members. Referred in from home by the home nurse for elevated blood pressures. Highest they got this morning was 200/114. Patient also was feeling fatigued and weak but feeling well nothing specific. Patient has a history of dementia but able to answer questions. Also history of prior stroke. Room air sats were 98%. Denied chest pain shortness of breath. Had some nausea and lightheadedness no true dizziness no true vertigo. [facility] [place] Patient denies any chest pain or shortness of breath abdominal pain nausea or vomiting.  Past Medical History  Diagnosis Date  . Hypertension   . Coronary artery disease   . Diabetes mellitus without complication (HCC)   . MI, old   . Reflux   . Depression   . Cervicalgia   . Disturbance of skin sensation   . Coronary atherosclerosis of unspecified type of vessel, native or graft   . Unspecified hearing loss   . Unspecified sleep apnea   . Alcohol abuse, in remission   . Atrial flutter (HCC)   . Stroke (HCC)   . Dementia   . Back pain    Past Surgical History  Procedure Laterality Date  . Hernia repair    . Rotator cuff repair     Family History  Problem Relation Age of Onset  . Anxiety disorder Mother   . Cancer Mother    Social History  Substance Use Topics  . Smoking status: Former Games developer  . Smokeless tobacco: Never Used  . Alcohol Use: No    Review of Systems  Constitutional: Positive for fatigue.  Negative for fever.  HENT: Positive for hearing loss. Negative for congestion.   Eyes: Negative for visual disturbance.  Respiratory: Negative for shortness of breath.   Cardiovascular: Negative for chest pain.  Gastrointestinal: Negative for abdominal pain.  Genitourinary: Negative for dysuria.  Musculoskeletal: Negative for back pain.  Skin: Negative for rash.  Neurological: Positive for weakness and light-headedness. Negative for dizziness, syncope, speech difficulty and headaches.  Hematological: Does not bruise/bleed easily.  Psychiatric/Behavioral: Negative for confusion.      Allergies  Review of patient's allergies indicates no known allergies.  Home Medications   Prior to Admission medications   Medication Sig Start Date End Date Taking? Authorizing Provider  acetaminophen (TYLENOL) 325 MG tablet Take 2 tablets (650 mg total) by mouth every 4 (four) hours as needed for mild pain (or temp > 99 F). 02/21/15   David L Rinehuls, PA-C  albuterol (PROVENTIL) (2.5 MG/3ML) 0.083% nebulizer solution Take 3 mLs (2.5 mg total) by nebulization 2 (two) times daily as needed for wheezing or shortness of breath. 02/21/15   Kinnie Scales Rinehuls, PA-C  allopurinol (ZYLOPRIM) 300 MG tablet Take 300 mg by mouth daily.    Historical Provider, MD  amLODipine (NORVASC) 5 MG tablet Take 1 tablet (5 mg total) by mouth daily. 02/21/15   Onalee Hua L Rinehuls, PA-C  aspirin EC 81 MG tablet Take 1 tablet (81 mg total) by mouth daily. 08/19/15  Marvel PlanJindong Xu, MD  atorvastatin (LIPITOR) 10 MG tablet Take 1 tablet (10 mg total) by mouth daily at 6 PM. 02/21/15   Kinnie Scalesavid L Rinehuls, PA-C  carvedilol (COREG) 25 MG tablet Take 1 tablet (25 mg total) by mouth 2 (two) times daily with a meal. 02/21/15   David L Rinehuls, PA-C  DULoxetine 40 MG CPEP Take 40 mg by mouth daily. 02/21/15   David L Rinehuls, PA-C  gabapentin (NEURONTIN) 300 MG capsule Take 300 mg by mouth daily.    Historical Provider, MD  hydrALAZINE (APRESOLINE) 50  MG tablet Take 1 tablet (50 mg total) by mouth every 8 (eight) hours. 02/21/15   David L Rinehuls, PA-C  HYDROcodone-acetaminophen (NORCO/VICODIN) 5-325 MG tablet Take 1 tablet by mouth every 6 (six) hours as needed for moderate pain. 11/30/15   Bethann BerkshireJoseph Zammit, MD  l-methylfolate-B6-B12 (METANX) 3-35-2 MG TABS Take 1 tablet by mouth daily.    Historical Provider, MD  lisinopril (PRINIVIL,ZESTRIL) 20 MG tablet Take 1 tablet (20 mg total) by mouth 2 (two) times daily. 02/21/15   David L Rinehuls, PA-C  metFORMIN (GLUCOPHAGE) 500 MG tablet Take 500 mg by mouth 2 (two) times daily with a meal.     Historical Provider, MD  mirtazapine (REMERON) 15 MG tablet Take 15 mg by mouth at bedtime.  05/06/15   Historical Provider, MD  Multiple Vitamin (MULTIVITAMIN WITH MINERALS) TABS tablet Take 1 tablet by mouth daily. 02/21/15   David L Rinehuls, PA-C  nitroGLYCERIN (NITROSTAT) 0.4 MG SL tablet Place 0.4 mg under the tongue every 5 (five) minutes as needed. Chest pain    Historical Provider, MD  omeprazole (PRILOSEC) 20 MG capsule Take 20 mg by mouth daily.    Historical Provider, MD  QUEtiapine (SEROQUEL) 25 MG tablet Take 1 tablet (25 mg total) by mouth at bedtime. 02/21/15   David L Rinehuls, PA-C  tamsulosin (FLOMAX) 0.4 MG CAPS capsule Take 0.4 mg by mouth.    Historical Provider, MD   BP 195/88 mmHg  Pulse 51  Temp(Src) 98.3 F (36.8 C) (Oral)  Resp 17  Ht 5\' 9"  (1.753 m)  Wt 96.701 kg  BMI 31.47 kg/m2  SpO2 98% Physical Exam  Constitutional: He is oriented to person, place, and time. He appears well-developed and well-nourished. No distress.  HENT:  Head: Normocephalic and atraumatic.  Mouth/Throat: Oropharynx is clear and moist.  Eyes: Conjunctivae and EOM are normal. Pupils are equal, round, and reactive to light.  Neck: Normal range of motion. Neck supple.  Cardiovascular: Normal rate, regular rhythm and normal heart sounds.   No murmur heard. Pulmonary/Chest: Effort normal and breath sounds  normal. No respiratory distress.  Abdominal: Soft. Bowel sounds are normal. There is no tenderness.  Musculoskeletal: Normal range of motion. He exhibits no edema.  Neurological: He is alert and oriented to person, place, and time. No cranial nerve deficit. He exhibits normal muscle tone. Coordination normal.  Except for some mild residual coordination abnormalities that her baseline from the stroke. Also has hearing deficit.  Nursing note and vitals reviewed.   ED Course  Procedures (including critical care time) Labs Review Labs Reviewed  COMPREHENSIVE METABOLIC PANEL - Abnormal; Notable for the following:    Glucose, Bld 106 (*)    ALT 15 (*)    All other components within normal limits  URINALYSIS, ROUTINE W REFLEX MICROSCOPIC (NOT AT [facility]) - Abnormal; Notable for the following:    Protein, ur 30 (*)    All other components within normal  limits  URINE MICROSCOPIC-ADD ON - Abnormal; Notable for the following:    Squamous Epithelial / LPF 0-5 (*)    Bacteria, UA RARE (*)    All other components within normal limits  CBG MONITORING, ED - Abnormal; Notable for the following:    Glucose-Capillary 100 (*)    All other components within normal limits  PROTIME-INR  APTT  CBC  DIFFERENTIAL  I-STAT TROPOININ, ED  I-STAT CHEM 8, ED    Imaging Review Dg Chest 2 View  01/09/2016  CLINICAL DATA:  Nausea, fatigue, confusion and hypertension. EXAM: CHEST - 2 VIEW COMPARISON:  06/03/2015 FINDINGS: The heart size and mediastinal contours are within normal limits. Stable chronic elevation of the left hemidiaphragm. There is no evidence of pulmonary edema, consolidation, pneumothorax, nodule or pleural fluid. The visualized skeletal structures are unremarkable. IMPRESSION: No active disease. Electronically Signed   By: Irish Lack M.D.   On: 01/09/2016 11:50   Ct Head Wo Contrast  01/09/2016  CLINICAL DATA:  81 year old male with altered mental status and confusion. Initial encounter.  Personal history of parenchymal hemorrhage in 2016. EXAM: CT HEAD WITHOUT CONTRAST TECHNIQUE: Contiguous axial images were obtained from the base of the skull through the vertex without intravenous contrast. COMPARISON:  06/03/2015 and earlier. FINDINGS: Visualized paranasal sinuses and mastoids are clear. No osseous abnormality identified. No acute orbit or scalp soft tissue finding. Stable encephalomalacia in the left occipital lobe with mild ex vacuo enlargement of the left occipital horn. Patchy and confluent white matter hypodensity elsewhere is stable. Stable cerebral volume. No ventriculomegaly. No midline shift, mass effect, or evidence of intracranial mass lesion. No acute intracranial hemorrhage identified. No acute cortically based infarct identified. No suspicious intracranial vascular hyperdensity. IMPRESSION: No acute intracranial abnormality. Chronically advanced white matter disease and sequelae of left occipital hemorrhage in 2016. Electronically Signed   By: Odessa Fleming M.D.   On: 01/09/2016 13:05   I have personally reviewed and evaluated these images and lab results as part of my medical decision-making.   EKG Interpretation   Date/Time:  Friday January 09 2016 10:30:19 EDT Ventricular Rate:  55 PR Interval:  198 QRS Duration: 92 QT Interval:  414 QTC Calculation: 396 R Axis:   47 Text Interpretation:  Sinus bradycardia Left ventricular hypertrophy with  repolarization abnormality Cannot rule out Septal infarct , age  undetermined Abnormal ECG No significant change since last tracing  Confirmed by Nicie Milan  MD, Lezley Bedgood [phone]) on 01/09/2016 11:09:01 AM      MDM   Final diagnoses:  Essential hypertension    Patient referred in for elevated blood pressures. Patient blood pressure was 188/102 at home than 200/114. Home nurse was there patient seemed to be sleepier than usual. Patient did not feel quite well this morning. Referred in for further evaluation. Patient was given his  blood pressure medicines prior to coming in and upon arrival here blood pressure had improved significantly. Again blood pressures down as low as 123/71 systolics. Patient also was feeling better. Workup here to include head CT EKG without acute changes head CT without any acute abnormalities chest x-ray was negative for pneumonia pneumothorax or pulmonary edema. Labs including urinalysis done a significant abnormalities. Patient stable for discharge home. Patient may require some tweaking of his blood pressure medicines. Recommend keeping a log of the blood pressures and following up with primary care doctor to see if there needs to be some adjustments. Patient's had of stroke in the past and has some residual  coordination problems based on that. According to patient's wife everything was seen today is baseline. Trauma C    Vanetta Mulders, MD 01/09/16 1553

## 2016-01-09 NOTE — Telephone Encounter (Signed)
LFt vm for Austin Sawyer [facility]. Per phone message, pts blood pressure was elevated. Rn stated on vm message will be sent to Dr. Roda ShuttersXu. Rn also explain on vm that patients PCP needs to be contact too. Rn explain the PCP manages the blood pressure meds and status for patient.Per phone message family is taking patient to ED.

## 2016-01-09 NOTE — Discharge Instructions (Signed)
Return for any new or worse symptoms. Make an appointment to follow-up with his primary care doctor. Keep a log of his blood pressures may require some tweaking and improvement of his blood pressure meds. Today's workup without any significant findings.

## 2016-01-09 NOTE — Telephone Encounter (Signed)
Austin FlakesMichelle Sawyer with [facility] is calling to give an update on the patient. She saw the patient today and his BP was elevated. The first time she took his BP it was 188/102, 15 minutes later 200/114 and 15 minutes after that 208/98. She stated the patient's family are going to take him to the ER.

## 2016-01-12 ENCOUNTER — Telehealth: Payer: Self-pay | Admitting: Neurology

## 2016-01-12 NOTE — Telephone Encounter (Signed)
Rn call Marcelino DusterMichelle to continue OT orders per Dr. Roda ShuttersXu.

## 2016-01-12 NOTE — Telephone Encounter (Signed)
Michelle/[facility] [phone] called to request verbal orders for OT to continue

## 2016-01-15 NOTE — Telephone Encounter (Signed)
Michelle/[facility] [phone] called to request order change in frequency to 1 x week for 3 weeks.

## 2016-01-15 NOTE — Telephone Encounter (Addendum)
Rn call Marcelino DusterMichelle to continue OT orders at 1 week for 3 weeks Per Dr.Xu

## 2016-03-13 ENCOUNTER — Emergency Department (HOSPITAL_COMMUNITY): Payer: Medicare Other

## 2016-03-13 ENCOUNTER — Encounter (HOSPITAL_COMMUNITY): Payer: Self-pay | Admitting: *Deleted

## 2016-03-13 ENCOUNTER — Emergency Department (HOSPITAL_COMMUNITY)
Admission: EM | Admit: 2016-03-13 | Discharge: 2016-03-13 | Disposition: A | Discharge status: Home / Self Care | Payer: Medicare Other | Attending: Emergency Medicine | Admitting: Emergency Medicine

## 2016-03-13 DIAGNOSIS — M545 Low back pain, unspecified: Secondary | ICD-10-CM

## 2016-03-13 DIAGNOSIS — M47816 Spondylosis without myelopathy or radiculopathy, lumbar region: Secondary | ICD-10-CM | POA: Diagnosis not present

## 2016-03-13 DIAGNOSIS — I2510 Atherosclerotic heart disease of native coronary artery without angina pectoris: Secondary | ICD-10-CM | POA: Insufficient documentation

## 2016-03-13 DIAGNOSIS — Z87891 Personal history of nicotine dependence: Secondary | ICD-10-CM | POA: Diagnosis not present

## 2016-03-13 DIAGNOSIS — I10 Essential (primary) hypertension: Secondary | ICD-10-CM | POA: Diagnosis not present

## 2016-03-13 DIAGNOSIS — E119 Type 2 diabetes mellitus without complications: Secondary | ICD-10-CM | POA: Diagnosis not present

## 2016-03-13 DIAGNOSIS — F329 Major depressive disorder, single episode, unspecified: Secondary | ICD-10-CM | POA: Insufficient documentation

## 2016-03-13 DIAGNOSIS — M549 Dorsalgia, unspecified: Secondary | ICD-10-CM | POA: Diagnosis not present

## 2016-03-13 DIAGNOSIS — Z79899 Other long term (current) drug therapy: Secondary | ICD-10-CM | POA: Insufficient documentation

## 2016-03-13 DIAGNOSIS — I252 Old myocardial infarction: Secondary | ICD-10-CM | POA: Insufficient documentation

## 2016-03-13 DIAGNOSIS — Z7984 Long term (current) use of oral hypoglycemic drugs: Secondary | ICD-10-CM | POA: Diagnosis not present

## 2016-03-13 DIAGNOSIS — Z8673 Personal history of transient ischemic attack (TIA), and cerebral infarction without residual deficits: Secondary | ICD-10-CM | POA: Insufficient documentation

## 2016-03-13 DIAGNOSIS — Z7982 Long term (current) use of aspirin: Secondary | ICD-10-CM | POA: Insufficient documentation

## 2016-03-13 LAB — URINALYSIS, ROUTINE W REFLEX MICROSCOPIC
BILIRUBIN URINE: NEGATIVE
Glucose, UA: NEGATIVE mg/dL
Hgb urine dipstick: NEGATIVE
KETONES UR: NEGATIVE mg/dL
Leukocytes, UA: NEGATIVE
NITRITE: NEGATIVE
PROTEIN: NEGATIVE mg/dL
Specific Gravity, Urine: 1.012 (ref 1.005–1.030)
pH: 7.5 (ref 5.0–8.0)

## 2016-03-13 LAB — CBG MONITORING, ED: GLUCOSE-CAPILLARY: 86 mg/dL (ref 65–99)

## 2016-03-13 MED ORDER — NAPROXEN 500 MG PO TABS: 500.0000 mg | ORAL_TABLET | Freq: Two times a day (BID) | ORAL | Status: DC

## 2016-03-13 MED ORDER — HYDROCODONE-ACETAMINOPHEN 5-325 MG PO TABS
1.0000 | ORAL_TABLET | Freq: Once | ORAL | Status: AC
  Administered 2016-03-13: 1 via ORAL
  Filled 2016-03-13: qty 1

## 2016-03-13 NOTE — ED Notes (Signed)
Pt ambulated with cane approx 20 steps. Stand by assistance provided, but pt maintained steady gait throughout. Pt c/o soreness in his hips, but denied any back pain, weakness, dizziness, numbness/tingling of extremities. Pt then ambulated approx 10 steps without cane. Pt stated he felt comfortable in doing so; steady gait. Son advised his gait was "slower, but looked better". Dr Manus Gunningancour observed pt ambulating without cane.

## 2016-03-13 NOTE — ED Notes (Signed)
To us

## 2016-03-13 NOTE — ED Notes (Signed)
Pt reports hx of mild back pain, increase in bilateral lower back pain since yesterday.

## 2016-03-13 NOTE — ED Notes (Signed)
The pt is dressed and  The pts son is anxious to go

## 2016-03-13 NOTE — Discharge Instructions (Signed)
Back Pain, Adult Follow up with your doctor. Return to the ED if you develop new or worsening symptoms. Back pain is very common in adults.The cause of back pain is rarely dangerous and the pain often gets better over time.The cause of your back pain may not be known. Some common causes of back pain include:  Strain of the muscles or ligaments supporting the spine.  Wear and tear (degeneration) of the spinal disks.  Arthritis.  Direct injury to the back. For many people, back pain may return. Since back pain is rarely dangerous, most people can learn to manage this condition on their own. HOME CARE INSTRUCTIONS Watch your back pain for any changes. The following actions may help to lessen any discomfort you are feeling:  Remain active. It is stressful on your back to sit or stand in one place for long periods of time. Do not sit, drive, or stand in one place for more than 30 minutes at a time. Take short walks on even surfaces as soon as you are able.Try to increase the length of time you walk each day.  Exercise regularly as directed by your health care provider. Exercise helps your back heal faster. It also helps avoid future injury by keeping your muscles strong and flexible.  Do not stay in bed.Resting more than 1-2 days can delay your recovery.  Pay attention to your body when you bend and lift. The most comfortable positions are those that put less stress on your recovering back. Always use proper lifting techniques, including:  Bending your knees.  Keeping the load close to your body.  Avoiding twisting.  Find a comfortable position to sleep. Use a firm mattress and lie on your side with your knees slightly bent. If you lie on your back, put a pillow under your knees.  Avoid feeling anxious or stressed.Stress increases muscle tension and can worsen back pain.It is important to recognize when you are anxious or stressed and learn ways to manage it, such as with  exercise.  Take medicines only as directed by your health care provider. Over-the-counter medicines to reduce pain and inflammation are often the most helpful.Your health care provider may prescribe muscle relaxant drugs.These medicines help dull your pain so you can more quickly return to your normal activities and healthy exercise.  Apply ice to the injured area:  Put ice in a plastic bag.  Place a towel between your skin and the bag.  Leave the ice on for 20 minutes, 2-3 times a day for the first 2-3 days. After that, ice and heat may be alternated to reduce pain and spasms.  Maintain a healthy weight. Excess weight puts extra stress on your back and makes it difficult to maintain good posture. SEEK MEDICAL CARE IF:  You have pain that is not relieved with rest or medicine.  You have increasing pain going down into the legs or buttocks.  You have pain that does not improve in one week.  You have night pain.  You lose weight.  You have a fever or chills. SEEK IMMEDIATE MEDICAL CARE IF:   You develop new bowel or bladder control problems.  You have unusual weakness or numbness in your arms or legs.  You develop nausea or vomiting.  You develop abdominal pain.  You feel faint.   This information is not intended to replace advice given to you by your health care provider. Make sure you discuss any questions you have with your health care provider.  Document Released: 09/20/2005 Document Revised: 10/11/2014 Document Reviewed: 01/22/2014 Elsevier Interactive Patient Education [employer].

## 2016-03-13 NOTE — ED Provider Notes (Signed)
CSN: [id number]     Arrival date & time 03/13/16  1449 History   First MD Initiated Contact with Patient 03/13/16 1601     Chief Complaint  Patient presents with  . Back Pain     (Consider location/radiation/quality/duration/timing/severity/associated sxs/prior Treatment) HPI Comments: Level V caveat for dementia. Patient here with son complaining of low back pain worsening over the past 2 days. History of low back pain son states for quite some time but more severe over the past 2 days. Denies any recent falls or trauma. Not taking anything at home for pain other than Biofreeze. Denies any fever, chills, nausea or vomiting. No chest pain or shortness of breath. Pain  stays across low back and does not radiate down buttocks or legs. No focal weakness, numbness or tingling. No bowel or bladder incontinence. No fever or vomiting. No previous back surgeries. No history of cancer. Able toward with cane today which is not his baseline. History of previous stroke without any residual deficits other than memory issues.  The history is provided by a relative and the patient.    Past Medical History  Diagnosis Date  . Hypertension   . Coronary artery disease   . Diabetes mellitus without complication (HCC)   . MI, old   . Reflux   . Depression   . Cervicalgia   . Disturbance of skin sensation   . Coronary atherosclerosis of unspecified type of vessel, native or graft   . Unspecified hearing loss   . Unspecified sleep apnea   . Alcohol abuse, in remission   . Atrial flutter (HCC)   . Stroke (HCC)   . Dementia   . Back pain    Past Surgical History  Procedure Laterality Date  . Hernia repair    . Rotator cuff repair     Family History  Problem Relation Age of Onset  . Anxiety disorder Mother   . Cancer Mother    Social History  Substance Use Topics  . Smoking status: Former Games developer  . Smokeless tobacco: Never Used  . Alcohol Use: No    Review of Systems  Constitutional:  Negative for fever, activity change, appetite change and fatigue.  Respiratory: Negative for cough, chest tightness and shortness of breath.   Cardiovascular: Negative for chest pain.  Gastrointestinal: Negative for nausea, vomiting and abdominal pain.  Genitourinary: Negative for dysuria and hematuria.  Musculoskeletal: Positive for back pain. Negative for myalgias, arthralgias, gait problem, neck pain and neck stiffness.  Skin: Negative for rash.  Neurological: Negative for dizziness, light-headedness and headaches.  A complete 10 system review of systems was obtained and all systems are negative except as noted in the HPI and PMH.      Allergies  Review of patient's allergies indicates no known allergies.  Home Medications   Prior to Admission medications   Medication Sig Start Date End Date Taking? Authorizing Provider  acetaminophen (TYLENOL) 325 MG tablet Take 2 tablets (650 mg total) by mouth every 4 (four) hours as needed for mild pain (or temp > 99 F). 02/21/15   David L Rinehuls, PA-C  albuterol (PROVENTIL) (2.5 MG/3ML) 0.083% nebulizer solution Take 3 mLs (2.5 mg total) by nebulization 2 (two) times daily as needed for wheezing or shortness of breath. 02/21/15   Onalee Hua L Rinehuls, PA-C  allopurinol (ZYLOPRIM) 300 MG tablet Take 300 mg by mouth daily as needed. For gout    Historical Provider, MD  amLODipine (NORVASC) 5 MG tablet Take 1 tablet (5  mg total) by mouth daily. 02/21/15   Onalee Huaavid L Rinehuls, PA-C  aspirin EC 81 MG tablet Take 1 tablet (81 mg total) by mouth daily. 08/19/15   Marvel PlanJindong Xu, MD  atorvastatin (LIPITOR) 10 MG tablet Take 1 tablet (10 mg total) by mouth daily at 6 PM. 02/21/15   David L Rinehuls, PA-C  carvedilol (COREG) 25 MG tablet Take 1 tablet (25 mg total) by mouth 2 (two) times daily with a meal. 02/21/15   David L Rinehuls, PA-C  DULoxetine 40 MG CPEP Take 40 mg by mouth daily. 02/21/15   David L Rinehuls, PA-C  gabapentin (NEURONTIN) 300 MG capsule Take 300 mg  by mouth daily.    Historical Provider, MD  hydrALAZINE (APRESOLINE) 50 MG tablet Take 1 tablet (50 mg total) by mouth every 8 (eight) hours. 02/21/15   David L Rinehuls, PA-C  HYDROcodone-acetaminophen (NORCO/VICODIN) 5-325 MG tablet Take 1 tablet by mouth every 6 (six) hours as needed for moderate pain. 11/30/15   Bethann BerkshireJoseph Zammit, MD  l-methylfolate-B6-B12 (METANX) 3-35-2 MG TABS Take 1 tablet by mouth daily.    Historical Provider, MD  lisinopril (PRINIVIL,ZESTRIL) 20 MG tablet Take 1 tablet (20 mg total) by mouth 2 (two) times daily. 02/21/15   David L Rinehuls, PA-C  metFORMIN (GLUCOPHAGE) 500 MG tablet Take 500 mg by mouth 2 (two) times daily with a meal.     Historical Provider, MD  mirtazapine (REMERON) 15 MG tablet Take 15 mg by mouth at bedtime.  05/06/15   Historical Provider, MD  Multiple Vitamin (MULTIVITAMIN WITH MINERALS) TABS tablet Take 1 tablet by mouth daily. 02/21/15   David L Rinehuls, PA-C  naproxen (NAPROSYN) 500 MG tablet Take 1 tablet (500 mg total) by mouth 2 (two) times daily. 03/13/16   Glynn OctaveStephen Ladislav Caselli, MD  nitroGLYCERIN (NITROSTAT) 0.4 MG SL tablet Place 0.4 mg under the tongue every 5 (five) minutes as needed. Chest pain    Historical Provider, MD  omeprazole (PRILOSEC) 20 MG capsule Take 20 mg by mouth daily.    Historical Provider, MD  QUEtiapine (SEROQUEL) 25 MG tablet Take 1 tablet (25 mg total) by mouth at bedtime. 02/21/15   David L Rinehuls, PA-C  tamsulosin (FLOMAX) 0.4 MG CAPS capsule Take 0.4 mg by mouth.    Historical Provider, MD   BP 157/86 mmHg  Pulse 60  Temp(Src) 98.5 F (36.9 C) (Oral)  Resp 18  Ht 5\' 10"  (1.778 m)  Wt 245 lb (111.131 kg)  BMI 35.15 kg/m2  SpO2 98% Physical Exam  Constitutional: He is oriented to person, place, and time. He appears well-developed and well-nourished. No distress.  HENT:  Head: Normocephalic and atraumatic.  Mouth/Throat: Oropharynx is clear and moist. No oropharyngeal exudate.  Eyes: Conjunctivae and EOM are normal.  Pupils are equal, round, and reactive to light.  Neck: Normal range of motion. Neck supple.  No meningismus.  Cardiovascular: Normal rate, regular rhythm, normal heart sounds and intact distal pulses.   No murmur heard. Pulmonary/Chest: Effort normal and breath sounds normal. No respiratory distress.  Abdominal: Soft. There is no tenderness. There is no rebound and no guarding.  Musculoskeletal: Normal range of motion. He exhibits tenderness. He exhibits no edema.  Paraspinal lumbar tenderness. No midline tenderness  5/5 strength in bilateral lower extremities. Ankle plantar and dorsiflexion intact. Great toe extension intact bilaterally. +2 DP and PT pulses. Unable to elicit patellar reflexes bilaterally.    Neurological: He is alert and oriented to person, place, and time. No cranial nerve  deficit. He exhibits normal muscle tone. Coordination normal.  No ataxia on finger to nose bilaterally. No pronator drift. 5/5 strength throughout. CN 2-12 intact.Equal grip strength. Sensation intact.   Skin: Skin is warm.  Psychiatric: He has a normal mood and affect. His behavior is normal.  Nursing note and vitals reviewed.   ED Course  Procedures (including critical care time) Labs Review Labs Reviewed  URINALYSIS, ROUTINE W REFLEX MICROSCOPIC (NOT AT [facility])  CBG MONITORING, ED    Imaging Review Dg Lumbar Spine Complete  03/13/2016  CLINICAL DATA:  Acute on chronic low back pain, no recent injury, worsening pain starting yesterday EXAM: LUMBAR SPINE - COMPLETE 4+ VIEW COMPARISON:  11/30/2015 FINDINGS: Five views of the lumbar spine submitted. No acute fracture or subluxation. Atherosclerotic calcifications of abdominal aorta and iliac arteries. Again noted mild disc space flattening with anterior spurring at L4-L5 level. Disc space flattening with vacuum disc phenomenon and mild anterior spurring at L5-S1 level. Facet degenerative changes are noted L4 and L5 level. Mild anterior spurring lower  endplate of L2 and L3 vertebral body again noted. IMPRESSION: No acute fracture or subluxation. Degenerative changes as described above. Electronically Signed   By: Natasha Mead M.D.   On: 03/13/2016 18:20   US Aorta  03/13/2016  CLINICAL DATA:  Recent worsening of back pain. EXAM: ULTRASOUND OF ABDOMINAL AORTA TECHNIQUE: Ultrasound examination of the abdominal aorta was performed to evaluate for abdominal aortic aneurysm. COMPARISON:  Abdomen and pelvis CT dated 12/05/2007. FINDINGS: Abdominal Aorta No aneurysm identified. Maximum Diameter: 2.6 cm IMPRESSION: No aneurysm. Electronically Signed   By: Beckie Salts M.D.   On: 03/13/2016 17:27   I have personally reviewed and evaluated these images and lab results as part of my medical decision-making.   EKG Interpretation None      MDM   Final diagnoses:  Midline low back pain without sciatica   Acute on chronic back pain without trauma.  No fever. No red flags on exam.  Equal pulses and strength and sensation.  No inconitence.  Urinalysis negative. Lumbar spine x-ray negative. No evidence of abdominal aneurysm.  No evidence of cord compression or cauda equina. Equal strength and sensation throughout. Patient ambulatory without assistance without difficulty. He states he feels well.  Low suspicion for cord compression or cauda equina. No red flag symptoms on exam. Patient is ambulatory pain is controlled. Follow-up with PCP. We'll try anti-inflammatories and Tylenol. Return precautions discussed.    Glynn Octave, MD 03/13/16 2330

## 2016-03-13 NOTE — ED Notes (Signed)
The pt is here with lower back pain  He has chronic back pain.  No known injury  He uses a cream topically but that is not healing.  He is hard of hearing and wears a hearing aid

## 2016-03-13 NOTE — ED Notes (Signed)
Pt  Walked in the hallway.  The pt tolerated ok just pain  He has been walking with his wifes cain

## 2016-05-24 ENCOUNTER — Encounter: Payer: Self-pay | Admitting: Neurology

## 2016-05-24 ENCOUNTER — Ambulatory Visit (INDEPENDENT_AMBULATORY_CARE_PROVIDER_SITE_OTHER): Payer: Medicare Other | Admitting: Neurology

## 2016-05-24 VITALS — BP 120/75 | HR 63 | Ht 69.0 in | Wt 221.6 lb

## 2016-05-24 DIAGNOSIS — E1159 Type 2 diabetes mellitus with other circulatory complications: Secondary | ICD-10-CM | POA: Diagnosis not present

## 2016-05-24 DIAGNOSIS — I10 Essential (primary) hypertension: Secondary | ICD-10-CM

## 2016-05-24 DIAGNOSIS — F0150 Vascular dementia without behavioral disturbance: Secondary | ICD-10-CM | POA: Diagnosis not present

## 2016-05-24 DIAGNOSIS — E785 Hyperlipidemia, unspecified: Secondary | ICD-10-CM | POA: Diagnosis not present

## 2016-05-24 DIAGNOSIS — I611 Nontraumatic intracerebral hemorrhage in hemisphere, cortical: Secondary | ICD-10-CM

## 2016-05-24 NOTE — Patient Instructions (Addendum)
-   continue ASA 81 and lipitor for stroke prevention - follow up with eye doctor for visual field monitoring - no driving due to vision change - Follow up with your primary care physician for stroke risk factor modification. Recommend maintain blood pressure goal <130/80, diabetes with hemoglobin A1c goal below 6.5% and lipids with LDL cholesterol goal below 70 mg/dL.  - check BP and glucose at home. - follow up in 6 months.

## 2016-05-24 NOTE — Progress Notes (Signed)
STROKE NEUROLOGY FOLLOW UP NOTE  NAME: Austin Sawyer, Austin Sawyer  REASON FOR VISIT: stroke follow up HISTORY FROM: chart and pt and wife  Today we had the pleasure of seeing Austin Sawyer in follow-up at our Neurology Clinic. Pt was accompanied by wife.   History Summary Austin Sawyer is a 81 y.o. male with history of HTN, DM, CAD, atrial flutter on Coumadin before until 2011, alcohol abuse admitted on 02/08/15 for headache, nausea vomiting, confusion, blurred vision. CT had showed left occipital ICH with small SAH, SDH and IVH. Put on 3% saline and admitted to ICU. He was then stabilized and off 3% saline. Stroke work up including MRA, CUS, 2D ehco, and EEG were negative. Repeat CT head stable. LDL 107. He was discharged to SNF after medically ready.   Follow up 05/14/15 - the patient has been doing well. He stayed in SNF until one month ago. He is currently at home with home PT/OT/speech 3 times per week. He still has right hemoanopia and will follow up with ophthalmology tomorrow. He also follows up with PCP next month. BP today 137/77. He is on 4 BP meds now.     Follow up 08/18/15 - pt has been doing well. Glucose at home is OK, around 80s. BP today in clinic 125/75. Wife stated that pt seems to have significant cognitive impairment after stroke. Can not read, write, or doing math as per wife after stroke. Pt also repeat his questions during clinic visit. His right hemianopia seems somewhat better, he will see ophthal tomorrow for follow up. BP 125/75 today.  Follow up 11/2015 - pt has been doing the same. Had ophthalmology follow up and had new prescription glasses. No significant visual field changes, still has right hemianopia. Wife still reports cognitive impairment.  Interval History During the interval time,  Pt has been doing well. Seems right hemianopia improved, able to see shadow of hands, but still blurry. Lost glasses that ophthalmology prescribed to him. As per wife, his memory  is stable but still has a lot of mood swings. BP 120/75.   REVIEW OF SYSTEMS: Full 14 system review of systems performed and notable only for those listed below and in HPI above, all others are negative:  Constitutional:   Cardiovascular:  Ear/Nose/Throat:  Hearing loss Skin: mole, itching Eyes:   Respiratory:   Gastroitestinal:   Genitourinary:  Hematology/Lymphatic:   Endocrine:  Musculoskeletal: back pain Allergy/Immunology:   Neurological: memory loss, dizziness, HA, numbness, speech difficulty Psychiatric: confusion, anxious Sleep:    The following represents the patient's updated allergies and side effects list: No Known Allergies  The neurologically relevant items on the patient's problem list were reviewed on today's visit.  Neurologic Examination  A problem focused neurological exam (12 or more points of the single system neurologic examination, vital signs counts as 1 point, cranial nerves count for 8 points) was performed.  Blood pressure 120/75, pulse 63, height 5\' 9"  (1.753 m), weight 221 lb 9.6 oz (100.5 kg).  General - Well nourished, well developed, in no apparent distress.  Ophthalmologic - Fundi not visualized due to noncooperation.  Cardiovascular - Regular rate and rhythm.  Mental Status -  Awake alert, orientated to month and people, but not place, year, or date.  Language exam showed fluent speech, but difficulty to generate meaningful sentences. Able to follow some simple commands, but still has difficulty with comprehension, naming and repetition.   Cranial Nerves II - XII - II - right homonymous  hemianopia improved, able to see FC on the right visual field but right simutagnosia.  III, IV, VI - eyes disconjugated (chronic) but denies double vision. V - Facial sensation intact bilaterally. VII - Facial movement intact bilaterally. VIII - Hearing & vestibular intact bilaterally. X - Palate elevates symmetrically. XI - Chin turning & shoulder shrug  intact bilaterally. XII - Tongue protrusion intact.  Motor Strength - The patient's strength was normal in all extremities and pronator drift was absent.  Bulk was normal and fasciculations were absent.   Motor Tone - Muscle tone was assessed at the neck and appendages and was normal.  Reflexes - The patient's reflexes were 1+ in all extremities and he had no pathological reflexes.  Sensory - Light touch, temperature/pinprick were assessed and were normal.    Coordination - The patient had normal movements in the hands and feet with no ataxia or dysmetria.  Tremor was absent.  Gait and Station - mildly stooped posturing, able to walk without assistance.  Data reviewed: I personally reviewed the images and agree with the radiology interpretations.  Ct Head Wo Contrast 02/16/2015 - Stable subarachnoid hemorrhage seen overlying right parietal cortex. Stable large left occipital intraparenchymal hemorrhage is noted. with surrounding white matter edema resulting in approximately 6 mL. of left to right midline shift. Stable left-sided subdural hematoma is noted. Stable bilateral lateral intraventricular hemorrhage is noted without ventricular dilatation.  02/10/2015 - 1. No significant interval change in size of left occipital lobe intraparenchymal hematoma with slightly increased associated vasogenic edema as compared to previous. Similar trace left-to-right shift. 2. Stable to slightly decreased conspicuity of bilateral small volume subarachnoid hemorrhage. 3. Stable intraventricular hemorrhage. No hydrocephalus. 4. Persistent left subdural hematoma measuring up to 3.5 mm in maximal diameter. Blood along the left tentorium is decreased in conspicuity. 5. No new intracranial abnormality.  02/09/2015 IMPRESSION: 1. Slight enlargement of large left occipital lobe intra-axial hemorrhage. Estimated hemorrhage volume now 58 mm. Stable left posterior hemisphere edema and mass effect. 2. Mild  progression of subarachnoid extension, now bilateral. Extension and of a left subdural space with small broad-based left subdural hematoma not significantly changed. Stable intraventricular hemorrhage volume. No ventriculomegaly. 3. No new intracranial abnormality identified.   02/08/2015 IMPRESSION: 1. 6.7 cm left occipital parenchymal hemorrhage with mild surrounding edema. No midline shift. 2. Small amount of adjacent subarachnoid hemorrhage and small amount of intraventricular extension of blood. 3. Small subdural hematomas over the left cerebral convexity and left tentorium.   Dg Chest Port 1 View 02/13/15 1. Stable support apparatus. 2. Improved basilar atelectasis. 02/10/15 Tube and catheter positions as described without pneumothorax. No edema or consolidation. 02/09/2015 IMPRESSION: No acute cardiopulmonary disease.   Carotid Doppler No evidence of significant stenosis in the right or left ICA. The left side was technically challenged due to line placement.   2D Echocardiogram Left ventricle: The cavity size was normal. Wall thickness was increased in a pattern of moderate LVH. Systolic function was normal. The estimated ejection fraction was in the range of 55% to 60%. Doppler parameters are consistent with elevated ventricular end-diastolic filling pressure. - Aortic valve: Poorly seen Moderately calcified mild stenosis by gradients but may be underestimated due to poor CW angle. Valve area (VTI): 2.11 cm^2. Valve area (Vmax): 2.01 cm^2. Valve area (Vmean): 1.88 cm^2. - Mitral valve: There was mild regurgitation. - Left atrium: The atrium was moderately dilated. - Impressions: Cannot r/o SOE due to extremely poor image quality. Impressions: - Cannot r/o SOE due to  extremely poor image quality.  MRI and MRA  7.3 x 3.3 cm left occipital hematoma unchanged from the prior CT. There is intraventricular hemorrhage and subarachnoid hemorrhage also unchanged.  There is mass-effect on the left occipital horn and 3 mm midline shift to the right. No evidence of underlying tumor or vascular malformation. This may be related to cerebral amyloid angiography. Hypertension also possible. Negative MRA circle Willis.  EEG This EEG is abnormal with moderately severe generalized continuous nonspecific slowing of cerebral activity. No evidence of epileptic activity was recorded. The absence of epileptiform activity during an EEG recording does not, in and of itself, rule out seizure disorder, however.  EKG NSR  Component     Latest Ref Rng 02/09/2015  Cholesterol     0 - 200 mg/dL 454178  Triglycerides     <150 mg/dL 47  HDL Cholesterol     >40 mg/dL 62  Total CHOL/HDL Ratio      2.9  VLDL     0 - 40 mg/dL 9  LDL (calc)     0 - 99 mg/dL 098107 (H)  Hemoglobin J1BA1C     4.8 - 5.6 % 6.0 (H)  Mean Plasma Glucose      126  TSH     0.350 - 4.500 uIU/mL 1.154    Assessment: As you may recall, he is a 81 y.o. African American male with PMH of HTN, DM, CAD, atrial flutter on Coumadin before until 2010, alcohol abuse admitted on 02/08/15 for left occipital ICH with SAH, SDH and IVH. Recovered well with no need of surgical intervention. Stroke work up largely negative except LDL 107. Etiology still most likely HTN but CAA not able to completely ruled out. Pt later was sent to SNF and then home. Still has significant language deficit and hemianopia. BP stable on 4 BP meds. Repeat CT showed resolution of bleed and will put on ASA 81mg . Has hx of afib on coumadin but in 2011 Dr. Antoine PocheHochrein considered his Aflutter was cured and he was then off coumadin. Pt still has significant language and cognitive impairment, and MMSE 6/30. More consistent with vascular dementia. So far as per wife memory stable at home.  Plan:  - continue ASA 81 and lipitor for stroke prevention - follow up with eye doctor for visual field monitoring - no driving due to vision change - Follow up with  your primary care physician for stroke risk factor modification. Recommend maintain blood pressure goal <130/80, diabetes with hemoglobin A1c goal below 6.5% and lipids with LDL cholesterol goal below 70 mg/dL.  - check BP and glucose at home. - follow up in 6 months.  I spent more than 25 minutes of face to face time with the patient. Greater than 50% of time was spent in counseling and coordination of care. We have discussed about ophthalmology monitoring, BP monitoring, and driving privileges.    No orders of the defined types were placed in this encounter.   No orders of the defined types were placed in this encounter.   Patient Instructions  - continue ASA 81 and lipitor for stroke prevention - follow up with eye doctor for visual field monitoring - no driving due to vision change - Follow up with your primary care physician for stroke risk factor modification. Recommend maintain blood pressure goal <130/80, diabetes with hemoglobin A1c goal below 6.5% and lipids with LDL cholesterol goal below 70 mg/dL.  - check BP and glucose at home. - follow up in  6 months.   Marvel Plan, MD PhD [facility] [address], [address] [place], [place] [address] [phone]

## 2016-06-24 ENCOUNTER — Inpatient Hospital Stay (HOSPITAL_COMMUNITY)
Admission: EM | Admit: 2016-06-24 | Discharge: 2016-07-06 | DRG: 065 | Disposition: A | Discharge status: Skilled Nursing Facility | Payer: Medicare Other | Attending: Neurology | Admitting: Neurology

## 2016-06-24 ENCOUNTER — Emergency Department (HOSPITAL_COMMUNITY): Payer: Medicare Other

## 2016-06-24 ENCOUNTER — Encounter (HOSPITAL_COMMUNITY): Payer: Self-pay

## 2016-06-24 DIAGNOSIS — I610 Nontraumatic intracerebral hemorrhage in hemisphere, subcortical: Secondary | ICD-10-CM | POA: Diagnosis not present

## 2016-06-24 DIAGNOSIS — E785 Hyperlipidemia, unspecified: Secondary | ICD-10-CM | POA: Diagnosis present

## 2016-06-24 DIAGNOSIS — Z23 Encounter for immunization: Secondary | ICD-10-CM

## 2016-06-24 DIAGNOSIS — R414 Neurologic neglect syndrome: Secondary | ICD-10-CM | POA: Diagnosis present

## 2016-06-24 DIAGNOSIS — I680 Cerebral amyloid angiopathy: Secondary | ICD-10-CM | POA: Diagnosis not present

## 2016-06-24 DIAGNOSIS — I10 Essential (primary) hypertension: Secondary | ICD-10-CM | POA: Diagnosis not present

## 2016-06-24 DIAGNOSIS — I2510 Atherosclerotic heart disease of native coronary artery without angina pectoris: Secondary | ICD-10-CM | POA: Diagnosis present

## 2016-06-24 DIAGNOSIS — E669 Obesity, unspecified: Secondary | ICD-10-CM | POA: Diagnosis present

## 2016-06-24 DIAGNOSIS — Z6830 Body mass index (BMI) 30.0-30.9, adult: Secondary | ICD-10-CM | POA: Diagnosis not present

## 2016-06-24 DIAGNOSIS — Z87891 Personal history of nicotine dependence: Secondary | ICD-10-CM | POA: Diagnosis not present

## 2016-06-24 DIAGNOSIS — F0150 Vascular dementia without behavioral disturbance: Secondary | ICD-10-CM | POA: Diagnosis not present

## 2016-06-24 DIAGNOSIS — Z7984 Long term (current) use of oral hypoglycemic drugs: Secondary | ICD-10-CM | POA: Diagnosis not present

## 2016-06-24 DIAGNOSIS — I69191 Dysphagia following nontraumatic intracerebral hemorrhage: Secondary | ICD-10-CM

## 2016-06-24 DIAGNOSIS — M109 Gout, unspecified: Secondary | ICD-10-CM | POA: Diagnosis present

## 2016-06-24 DIAGNOSIS — I611 Nontraumatic intracerebral hemorrhage in hemisphere, cortical: Principal | ICD-10-CM | POA: Diagnosis present

## 2016-06-24 DIAGNOSIS — Z5189 Encounter for other specified aftercare: Secondary | ICD-10-CM | POA: Diagnosis not present

## 2016-06-24 DIAGNOSIS — Z7982 Long term (current) use of aspirin: Secondary | ICD-10-CM | POA: Diagnosis not present

## 2016-06-24 DIAGNOSIS — Z4682 Encounter for fitting and adjustment of non-vascular catheter: Secondary | ICD-10-CM | POA: Diagnosis not present

## 2016-06-24 DIAGNOSIS — K219 Gastro-esophageal reflux disease without esophagitis: Secondary | ICD-10-CM | POA: Diagnosis not present

## 2016-06-24 DIAGNOSIS — I4891 Unspecified atrial fibrillation: Secondary | ICD-10-CM | POA: Diagnosis present

## 2016-06-24 DIAGNOSIS — R1314 Dysphagia, pharyngoesophageal phase: Secondary | ICD-10-CM | POA: Diagnosis not present

## 2016-06-24 DIAGNOSIS — Z4659 Encounter for fitting and adjustment of other gastrointestinal appliance and device: Secondary | ICD-10-CM

## 2016-06-24 DIAGNOSIS — E119 Type 2 diabetes mellitus without complications: Secondary | ICD-10-CM | POA: Diagnosis present

## 2016-06-24 DIAGNOSIS — Z8673 Personal history of transient ischemic attack (TIA), and cerebral infarction without residual deficits: Secondary | ICD-10-CM

## 2016-06-24 DIAGNOSIS — R4182 Altered mental status, unspecified: Secondary | ICD-10-CM | POA: Diagnosis not present

## 2016-06-24 DIAGNOSIS — I252 Old myocardial infarction: Secondary | ICD-10-CM

## 2016-06-24 DIAGNOSIS — Z9114 Patient's other noncompliance with medication regimen: Secondary | ICD-10-CM | POA: Diagnosis not present

## 2016-06-24 DIAGNOSIS — I161 Hypertensive emergency: Secondary | ICD-10-CM | POA: Diagnosis present

## 2016-06-24 DIAGNOSIS — Z818 Family history of other mental and behavioral disorders: Secondary | ICD-10-CM

## 2016-06-24 DIAGNOSIS — R488 Other symbolic dysfunctions: Secondary | ICD-10-CM | POA: Diagnosis not present

## 2016-06-24 DIAGNOSIS — I69954 Hemiplegia and hemiparesis following unspecified cerebrovascular disease affecting left non-dominant side: Secondary | ICD-10-CM | POA: Diagnosis not present

## 2016-06-24 DIAGNOSIS — R278 Other lack of coordination: Secondary | ICD-10-CM | POA: Diagnosis not present

## 2016-06-24 DIAGNOSIS — R29717 NIHSS score 17: Secondary | ICD-10-CM | POA: Diagnosis present

## 2016-06-24 DIAGNOSIS — I619 Nontraumatic intracerebral hemorrhage, unspecified: Secondary | ICD-10-CM | POA: Diagnosis present

## 2016-06-24 DIAGNOSIS — R1319 Other dysphagia: Secondary | ICD-10-CM | POA: Diagnosis present

## 2016-06-24 DIAGNOSIS — G4730 Sleep apnea, unspecified: Secondary | ICD-10-CM | POA: Diagnosis present

## 2016-06-24 DIAGNOSIS — I5022 Chronic systolic (congestive) heart failure: Secondary | ICD-10-CM | POA: Diagnosis present

## 2016-06-24 DIAGNOSIS — I618 Other nontraumatic intracerebral hemorrhage: Secondary | ICD-10-CM | POA: Diagnosis not present

## 2016-06-24 DIAGNOSIS — I639 Cerebral infarction, unspecified: Secondary | ICD-10-CM | POA: Diagnosis not present

## 2016-06-24 DIAGNOSIS — G9340 Encephalopathy, unspecified: Secondary | ICD-10-CM | POA: Diagnosis not present

## 2016-06-24 DIAGNOSIS — I6789 Other cerebrovascular disease: Secondary | ICD-10-CM | POA: Diagnosis not present

## 2016-06-24 DIAGNOSIS — H9190 Unspecified hearing loss, unspecified ear: Secondary | ICD-10-CM | POA: Diagnosis present

## 2016-06-24 DIAGNOSIS — I6911 Attention and concentration deficit following nontraumatic intracerebral hemorrhage: Secondary | ICD-10-CM

## 2016-06-24 DIAGNOSIS — M10061 Idiopathic gout, right knee: Secondary | ICD-10-CM | POA: Diagnosis not present

## 2016-06-24 DIAGNOSIS — M6281 Muscle weakness (generalized): Secondary | ICD-10-CM | POA: Diagnosis not present

## 2016-06-24 DIAGNOSIS — Z8679 Personal history of other diseases of the circulatory system: Secondary | ICD-10-CM | POA: Diagnosis not present

## 2016-06-24 DIAGNOSIS — F0151 Vascular dementia with behavioral disturbance: Secondary | ICD-10-CM | POA: Diagnosis not present

## 2016-06-24 DIAGNOSIS — R1310 Dysphagia, unspecified: Secondary | ICD-10-CM | POA: Diagnosis not present

## 2016-06-24 DIAGNOSIS — Z931 Gastrostomy status: Secondary | ICD-10-CM | POA: Diagnosis not present

## 2016-06-24 DIAGNOSIS — E782 Mixed hyperlipidemia: Secondary | ICD-10-CM | POA: Diagnosis not present

## 2016-06-24 DIAGNOSIS — R2689 Other abnormalities of gait and mobility: Secondary | ICD-10-CM | POA: Diagnosis not present

## 2016-06-24 DIAGNOSIS — R1312 Dysphagia, oropharyngeal phase: Secondary | ICD-10-CM | POA: Diagnosis not present

## 2016-06-24 DIAGNOSIS — I110 Hypertensive heart disease with heart failure: Secondary | ICD-10-CM | POA: Diagnosis present

## 2016-06-24 LAB — COMPREHENSIVE METABOLIC PANEL
ALK PHOS: 64 U/L (ref 38–126)
ALT: 17 U/L (ref 17–63)
ANION GAP: 10 (ref 5–15)
AST: 19 U/L (ref 15–41)
Albumin: 4.2 g/dL (ref 3.5–5.0)
BILIRUBIN TOTAL: 0.7 mg/dL (ref 0.3–1.2)
BUN: 17 mg/dL (ref 6–20)
CALCIUM: 9.8 mg/dL (ref 8.9–10.3)
CO2: 25 mmol/L (ref 22–32)
Chloride: 102 mmol/L (ref 101–111)
Creatinine, Ser: 1.12 mg/dL (ref 0.61–1.24)
GFR calc non Af Amer: >60 mL/min (ref >60)
Glucose, Bld: 111 mg/dL — ABNORMAL HIGH (ref 65–99)
POTASSIUM: 4.2 mmol/L (ref 3.5–5.1)
SODIUM: 137 mmol/L (ref 135–145)
TOTAL PROTEIN: 8.1 g/dL (ref 6.5–8.1)

## 2016-06-24 LAB — DIFFERENTIAL
Basophils Absolute: 0 10*3/uL (ref 0.0–0.1)
Basophils Relative: 0 %
EOS ABS: 0.2 10*3/uL (ref 0.0–0.7)
EOS PCT: 1 %
LYMPHS PCT: 11 %
Lymphs Abs: 1.4 10*3/uL (ref 0.7–4.0)
MONO ABS: 0.8 10*3/uL (ref 0.1–1.0)
Monocytes Relative: 6 %
NEUTROS PCT: 82 %
Neutro Abs: 10.6 10*3/uL — ABNORMAL HIGH (ref 1.7–7.7)

## 2016-06-24 LAB — APTT: aPTT: 28 seconds (ref 24–36)

## 2016-06-24 LAB — CBC
HCT: 42.4 % (ref 39.0–52.0)
Hemoglobin: 13.9 g/dL (ref 13.0–17.0)
MCH: 29.6 pg (ref 26.0–34.0)
MCHC: 32.8 g/dL (ref 30.0–36.0)
MCV: 90.2 fL (ref 78.0–100.0)
PLATELETS: 202 10*3/uL (ref 150–400)
RBC: 4.7 MIL/uL (ref 4.22–5.81)
RDW: 14.1 % (ref 11.5–15.5)
WBC: 13.1 10*3/uL — ABNORMAL HIGH (ref 4.0–10.5)

## 2016-06-24 LAB — I-STAT TROPONIN, ED: Troponin i, poc: 0 ng/mL (ref 0.00–0.08)

## 2016-06-24 LAB — I-STAT CHEM 8, ED
BUN: 20 mg/dL (ref 6–20)
CALCIUM ION: 1.16 mmol/L (ref 1.15–1.40)
CREATININE: 1.2 mg/dL (ref 0.61–1.24)
Chloride: 101 mmol/L (ref 101–111)
GLUCOSE: 105 mg/dL — AB (ref 65–99)
HCT: 45 % (ref 39.0–52.0)
HEMOGLOBIN: 15.3 g/dL (ref 13.0–17.0)
POTASSIUM: 4.2 mmol/L (ref 3.5–5.1)
Sodium: 139 mmol/L (ref 135–145)
TCO2: 27 mmol/L (ref 0–100)

## 2016-06-24 LAB — CBG MONITORING, ED: GLUCOSE-CAPILLARY: 114 mg/dL — AB (ref 65–99)

## 2016-06-24 LAB — PROTIME-INR
INR: 1.06
PROTHROMBIN TIME: 13.8 s (ref 11.4–15.2)

## 2016-06-24 MED ORDER — NICARDIPINE HCL IN NACL 20-0.86 MG/200ML-% IV SOLN
0.0000 mg/h | INTRAVENOUS | Status: DC
  Administered 2016-06-24 – 2016-06-25 (×2): 5 mg/h via INTRAVENOUS
  Administered 2016-06-25: 3 mg/h via INTRAVENOUS
  Administered 2016-06-25: 5 mg/h via INTRAVENOUS
  Administered 2016-06-26: 3 mg/h via INTRAVENOUS
  Administered 2016-06-27 (×2): 5 mg/h via INTRAVENOUS
  Filled 2016-06-24 (×6): qty 200

## 2016-06-24 MED ORDER — ACETAMINOPHEN 325 MG PO TABS
650.0000 mg | ORAL_TABLET | ORAL | Status: DC | PRN
  Administered 2016-06-29: 650 mg via ORAL
  Filled 2016-06-24: qty 2

## 2016-06-24 MED ORDER — INFLUENZA VAC SPLIT QUAD 0.5 ML IM SUSY
0.5000 mL | PREFILLED_SYRINGE | INTRAMUSCULAR | Status: AC
Start: 2016-06-25 — End: 2016-06-25
  Administered 2016-06-25: 0.5 mL via INTRAMUSCULAR
  Filled 2016-06-24: qty 0.5

## 2016-06-24 MED ORDER — SODIUM CHLORIDE 0.9 % IV SOLN
INTRAVENOUS | Status: DC
  Administered 2016-06-24 – 2016-06-28 (×7): via INTRAVENOUS

## 2016-06-24 MED ORDER — SENNOSIDES-DOCUSATE SODIUM 8.6-50 MG PO TABS
1.0000 | ORAL_TABLET | Freq: Two times a day (BID) | ORAL | Status: DC
  Administered 2016-06-26 – 2016-07-06 (×16): 1 via ORAL
  Filled 2016-06-24 (×16): qty 1

## 2016-06-24 MED ORDER — STROKE: EARLY STAGES OF RECOVERY BOOK
Freq: Once | Status: AC
  Administered 2016-06-24
  Filled 2016-06-24: qty 1

## 2016-06-24 MED ORDER — LEVETIRACETAM 500 MG/5ML IV SOLN
1000.0000 mg | INTRAVENOUS | Status: AC
  Administered 2016-06-24: 1000 mg via INTRAVENOUS
  Filled 2016-06-24 (×2): qty 10

## 2016-06-24 MED ORDER — LEVETIRACETAM 500 MG/5ML IV SOLN
500.0000 mg | Freq: Two times a day (BID) | INTRAVENOUS | Status: DC
  Administered 2016-06-24 – 2016-06-28 (×8): 500 mg via INTRAVENOUS
  Filled 2016-06-24 (×11): qty 5

## 2016-06-24 MED ORDER — NICARDIPINE HCL IN NACL 20-0.86 MG/200ML-% IV SOLN
3.0000 mg/h | INTRAVENOUS | Status: DC
  Administered 2016-06-24: 5 mg/h via INTRAVENOUS
  Filled 2016-06-24 (×3): qty 200

## 2016-06-24 MED ORDER — FAMOTIDINE IN NACL 20-0.9 MG/50ML-% IV SOLN
20.0000 mg | Freq: Two times a day (BID) | INTRAVENOUS | Status: DC
  Administered 2016-06-25 – 2016-06-26 (×4): 20 mg via INTRAVENOUS
  Filled 2016-06-24 (×5): qty 50

## 2016-06-24 MED ORDER — ACETAMINOPHEN 650 MG RE SUPP: 650.0000 mg | RECTAL | Status: DC | PRN

## 2016-06-24 NOTE — ED Provider Notes (Signed)
[facility]-EMERGENCY DEPT Provider Note   CSN: [id number] Arrival date & time: 06/24/16  1318  Level V caveat altered mental status, dementia. History is obtained from his daughter and caretaker.   History   Chief Complaint Chief Complaint  Patient presents with  . Altered Mental Status    HPI Austin Sawyer is a 81 y.o. male.Patient was last normal yesterday when he developed shuffling gait.. Today he became more confused and daughter noticed his eyes deviated to the right. No treatment prior to coming here. Brought here by private vehicle. No other associated symptoms,No treatment prior to coming here.  HPI  Past Medical History:  Diagnosis Date  . Alcohol abuse, in remission   . Atrial flutter (HCC)   . Back pain   . Cervicalgia   . Coronary artery disease   . Coronary atherosclerosis of unspecified type of vessel, native or graft   . Dementia   . Depression   . Diabetes mellitus without complication (HCC)   . Disturbance of skin sensation   . Hypertension   . MI, old   . Reflux   . Stroke (HCC)   . Unspecified hearing loss   . Unspecified sleep apnea     Patient Active Problem List   Diagnosis Date Noted  . Vascular dementia 11/24/2015  . Atrial flutter, unspecified 11/24/2015  . Cognitive impairment 08/19/2015  . Type 2 diabetes mellitus with other circulatory complications (HCC) 05/14/2015  . Chronic systolic congestive heart failure (HCC) 05/14/2015  . ICH (intracerebral hemorrhage) (HCC)   . HLD (hyperlipidemia)   . Agitation   . Accelerated hypertension   . Encephalopathy acute 02/17/2015  . SVT (supraventricular tachycardia) (HCC) 02/16/2015  . Cytotoxic brain edema (HCC)   . Encounter for feeding tube placement   . CHF (congestive heart failure) (HCC)   . Atelectasis   . Acute respiratory failure with hypoxemia (HCC) 02/10/2015  . Hemorrhage of brain, nontraumatic (HCC) 02/08/2015  . Stroke due to intracerebral hemorrhage (HCC) 02/08/2015  . Cervical disc  disorder with radiculopathy of cervical region 09/14/2013  . Neck pain on left side 07/12/2013  . Paresthesia of left arm 07/12/2013  . Major depressive disorder, recurrent episode, severe (HCC) 04/30/2013  . Alcohol dependence in remission (HCC) 04/22/2013  . Hard of hearing 04/22/2013  . OBESITY, UNSPECIFIED 12/09/2009  . CARDIOMYOPATHY, SECONDARY 09/03/2009  . ATRIAL FLUTTER 09/03/2009  . Gout 03/12/2008  . HYPERGLYCEMIA 02/14/2008  . ANGINA PECTORIS 10/03/2007  . BARRETTS ESOPHAGUS 10/03/2007  . ARTHRITIS 10/03/2007  . WEIGHT LOSS 04/06/2007  . ANXIETY DEPRESSION 03/14/2007  . ABUSE, ALCOHOL, IN REMISSION 03/08/2007  . CORONARY ARTERY DISEASE 03/08/2007  . LACUNAR INFARCTION 03/08/2007  . COLONOSCOPY, HX OF 03/08/2007  . HYPOGONADISM 01/25/2007  . Hyperlipidemia 01/25/2007  . Essential hypertension 01/25/2007  . CONGESTIVE HEART FAILURE 01/25/2007  . GERD 01/25/2007  . SLEEP APNEA 01/25/2007    Past Surgical History:  Procedure Laterality Date  . HERNIA REPAIR    . ROTATOR CUFF REPAIR      OB History    No data available       Home Medications    Prior to Admission medications   Medication Sig Start Date End Date Taking? Authorizing Provider  allopurinol (ZYLOPRIM) 300 MG tablet Take 300 mg by mouth daily. For gout   Yes Historical Provider, MD  aspirin EC 81 MG tablet Take 1 tablet (81 mg total) by mouth daily. 08/19/15  Yes Marvel Plan, MD  atorvastatin (LIPITOR) 10 MG tablet Take 1  tablet (10 mg total) by mouth daily at 6 PM. Patient taking differently: Take 80 mg by mouth daily at 6 PM.  02/21/15  Yes David L Rinehuls, PA-C  carvedilol (COREG) 25 MG tablet Take 1 tablet (25 mg total) by mouth 2 (two) times daily with a meal. Patient taking differently: Take 25 mg by mouth daily.  02/21/15  Yes David L Rinehuls, PA-C  cloNIDine (CATAPRES) 0.1 MG tablet Take 0.1 mg by mouth daily.   Yes Historical Provider, MD  DULoxetine 40 MG CPEP Take 40 mg by mouth  daily. Patient taking differently: Take 20 mg by mouth daily.  02/21/15  Yes David L Rinehuls, PA-C  gabapentin (NEURONTIN) 300 MG capsule Take 300 mg by mouth daily.   Yes Historical Provider, MD  hydrALAZINE (APRESOLINE) 50 MG tablet Take 1 tablet (50 mg total) by mouth every 8 (eight) hours. Patient taking differently: Take 50 mg by mouth daily.  02/21/15  Yes David L Rinehuls, PA-C  hydrochlorothiazide (HYDRODIURIL) 25 MG tablet Take 12.5 mg by mouth daily.   Yes Historical Provider, MD  HYDROcodone-acetaminophen (NORCO/VICODIN) 5-325 MG tablet Take 1 tablet by mouth every 6 (six) hours as needed for moderate pain. 11/30/15  Yes Bethann BerkshireJoseph Zammit, MD  lisinopril (PRINIVIL,ZESTRIL) 20 MG tablet Take 1 tablet (20 mg total) by mouth 2 (two) times daily. Patient taking differently: Take 20 mg by mouth daily.  02/21/15  Yes David L Rinehuls, PA-C  metFORMIN (GLUCOPHAGE) 500 MG tablet Take 500 mg by mouth daily.    Yes Historical Provider, MD  mirtazapine (REMERON) 15 MG tablet Take 15 mg by mouth at bedtime.  05/06/15  Yes Historical Provider, MD  OVER THE COUNTER MEDICATION Take 8-10 drops by mouth See admin instructions. Cell Memory Drops - Mix 8-10 drops in 32oz of water and drink daily.   Yes Historical Provider, MD  QUEtiapine (SEROQUEL) 25 MG tablet Take 1 tablet (25 mg total) by mouth at bedtime. 02/21/15  Yes David L Rinehuls, PA-C  tamsulosin (FLOMAX) 0.4 MG CAPS capsule Take 0.4 mg by mouth daily.    Yes Historical Provider, MD  acetaminophen (TYLENOL) 325 MG tablet Take 2 tablets (650 mg total) by mouth every 4 (four) hours as needed for mild pain (or temp > 99 F). Patient not taking: Reported on 06/24/2016 02/21/15   Onalee Huaavid L Rinehuls, PA-C  albuterol (PROVENTIL) (2.5 MG/3ML) 0.083% nebulizer solution Take 3 mLs (2.5 mg total) by nebulization 2 (two) times daily as needed for wheezing or shortness of breath. 02/21/15   Onalee Huaavid L Rinehuls, PA-C  amLODipine (NORVASC) 5 MG tablet Take 1 tablet (5 mg total)  by mouth daily. Patient not taking: Reported on 06/24/2016 02/21/15   Kinnie Scalesavid L Rinehuls, PA-C  Multiple Vitamin (MULTIVITAMIN WITH MINERALS) TABS tablet Take 1 tablet by mouth daily. Patient not taking: Reported on 06/24/2016 02/21/15   Onalee Huaavid L Rinehuls, PA-C  naproxen (NAPROSYN) 500 MG tablet Take 1 tablet (500 mg total) by mouth 2 (two) times daily. Patient not taking: Reported on 06/24/2016 03/13/16   Glynn OctaveStephen Rancour, MD  nitroGLYCERIN (NITROSTAT) 0.4 MG SL tablet Place 0.4 mg under the tongue every 5 (five) minutes as needed. Chest pain    Historical Provider, MD    Family History Family History  Problem Relation Age of Onset  . Anxiety disorder Mother   . Cancer Mother     Social History Social History  Substance Use Topics  . Smoking status: Former Games developermoker  . Smokeless tobacco: Never Used  .  Alcohol use No     Allergies   Review of patient's allergies indicates no known allergies.   Review of Systems Review of Systems  Unable to perform ROS: Mental status change     Physical Exam Updated Vital Signs BP 192/90 (BP Location: Left Arm)   Pulse 69   Temp 98.1 F (36.7 C) (Oral)   Resp 18   SpO2 99%   Physical Exam  Constitutional:  Frail-appearing  HENT:  Head: Normocephalic and atraumatic.  Eyes:  Eyes deviated to right  Neck: Neck supple. No tracheal deviation present. No thyromegaly present.  Cardiovascular: Normal rate and regular rhythm.   No murmur heard. Pulmonary/Chest: Effort normal and breath sounds normal.  Abdominal: Soft. Bowel sounds are normal. He exhibits no distension. There is no tenderness.  Musculoskeletal: Normal range of motion. He exhibits no edema or tenderness.  Neurological: He is alert. Coordination normal.  Patient ignores his right side. He has a right mouth droop. Consistent with central cranial nerve VII deficit. He has spontaneous movement of left upper extremity right upper extremity and right lower extremity. No spontaneous  movement of left lower extremity. DTRs symmetric bilaterally at knee jerk ankle jerk and biceps toes neither upward or downward going  Skin: Skin is warm and dry. No rash noted.  Psychiatric: He has a normal mood and affect.  Nursing note and vitals reviewed.    ED Treatments / Results  Labs (all labs ordered are listed, but only abnormal results are displayed) Labs Reviewed  CBC - Abnormal; Notable for the following:       Result Value   WBC 13.1 (*)    All other components within normal limits  DIFFERENTIAL - Abnormal; Notable for the following:    Neutro Abs 10.6 (*)    All other components within normal limits  COMPREHENSIVE METABOLIC PANEL - Abnormal; Notable for the following:    Glucose, Bld 111 (*)    All other components within normal limits  CBG MONITORING, ED - Abnormal; Notable for the following:    Glucose-Capillary 114 (*)    All other components within normal limits  I-STAT CHEM 8, ED - Abnormal; Notable for the following:    Glucose, Bld 105 (*)    All other components within normal limits  PROTIME-INR  APTT  I-STAT TROPOININ, ED  CBG MONITORING, ED   Results for orders placed or performed during the hospital encounter of 06/24/16  Protime-INR  Result Value Ref Range   Prothrombin Time 13.8 11.4 - 15.2 seconds   INR 1.06   APTT  Result Value Ref Range   aPTT 28 24 - 36 seconds  CBC  Result Value Ref Range   WBC 13.1 (H) 4.0 - 10.5 K/uL   RBC 4.70 4.22 - 5.81 MIL/uL   Hemoglobin 13.9 13.0 - 17.0 g/dL   HCT 45.4 09.8 - 11.9 %   MCV 90.2 78.0 - 100.0 fL   MCH 29.6 26.0 - 34.0 pg   MCHC 32.8 30.0 - 36.0 g/dL   RDW 14.7 82.9 - 56.2 %   Platelets 202 150 - 400 K/uL  Differential  Result Value Ref Range   Neutrophils Relative % 82 %   Neutro Abs 10.6 (H) 1.7 - 7.7 K/uL   Lymphocytes Relative 11 %   Lymphs Abs 1.4 0.7 - 4.0 K/uL   Monocytes Relative 6 %   Monocytes Absolute 0.8 0.1 - 1.0 K/uL   Eosinophils Relative 1 %   Eosinophils Absolute 0.2 0.0  -  0.7 K/uL   Basophils Relative 0 %   Basophils Absolute 0.0 0.0 - 0.1 K/uL  Comprehensive metabolic panel  Result Value Ref Range   Sodium 137 135 - 145 mmol/L   Potassium 4.2 3.5 - 5.1 mmol/L   Chloride 102 101 - 111 mmol/L   CO2 25 22 - 32 mmol/L   Glucose, Bld 111 (H) 65 - 99 mg/dL   BUN 17 6 - 20 mg/dL   Creatinine, Ser 1.61 0.61 - 1.24 mg/dL   Calcium 9.8 8.9 - 09.6 mg/dL   Total Protein 8.1 6.5 - 8.1 g/dL   Albumin 4.2 3.5 - 5.0 g/dL   AST 19 15 - 41 U/L   ALT 17 17 - 63 U/L   Alkaline Phosphatase 64 38 - 126 U/L   Total Bilirubin 0.7 0.3 - 1.2 mg/dL   GFR calc non Af Amer >60 >60 mL/min   GFR calc Af Amer >60 >60 mL/min   Anion gap 10 5 - 15  CBG monitoring, ED  Result Value Ref Range   Glucose-Capillary 114 (H) 65 - 99 mg/dL  I-stat troponin, ED  Result Value Ref Range   Troponin i, poc 0.00 0.00 - 0.08 ng/mL   Comment 3          I-Stat Chem 8, ED  Result Value Ref Range   Sodium 139 135 - 145 mmol/L   Potassium 4.2 3.5 - 5.1 mmol/L   Chloride 101 101 - 111 mmol/L   BUN 20 6 - 20 mg/dL   Creatinine, Ser 0.45 0.61 - 1.24 mg/dL   Glucose, Bld 409 (H) 65 - 99 mg/dL   Calcium, Ion 8.11 9.14 - 1.40 mmol/L   TCO2 27 0 - 100 mmol/L   Hemoglobin 15.3 13.0 - 17.0 g/dL   HCT 78.2 95.6 - 21.3 %   Ct Head Wo Contrast  Result Date: 06/24/2016 CLINICAL DATA:  Altered mental status.  Evaluate for hemorrhage. EXAM: CT HEAD WITHOUT CONTRAST TECHNIQUE: Contiguous axial images were obtained from the base of the skull through the vertex without intravenous contrast. COMPARISON:  01/09/2016 FINDINGS: Brain: Acute hemorrhage in the high right frontal lobe. Along the anterior and lateral margins of the high-density clot is low-density that appears encapsulated, possibly unclotted blood/serum. Overall dimensions are 60 x 30 x 36 mm. There is a rim of surrounding edema and mild local subarachnoid extension. Midline shift measures up to 4 mm. No intraventricular extension or entrapment.  Gliosis in the left occipital lobe is related to lobar hemorrhage in 2016. Small-vessel ischemic changes in the cerebral white matter. No noted generalized hemorrhagic changes in the cerebral hemispheres on 2016 MPGR, although amyloid angiopathy is still possible. Hypertension or hemorrhagic infarct could also present with this appearance. Vascular: Atherosclerotic calcification. Skull: Normal. Negative for fracture or focal lesion. Sinuses/Orbits: Gaze to the right. Other: Critical Value/emergent results were called by telephone at the time of interpretation on 06/24/2016 at 2:36 pm to Dr. Ethelda Chick, who verbally acknowledged these results. IMPRESSION: 1. ~32 cc right frontal acute parenchymal hematoma. Mild local subarachnoid extension. 4 mm midline shift. 2. Gliosis from remote left occipital lobar hemorrhage in 2016. Possible amyloid angiopathy, but no typical chronic hemorrhagic foci on 2016 brain MRI. Question hemorrhagic conversion of infarct. Electronically Signed   By: Marnee Spring M.D.   On: 06/24/2016 14:42    EKG  EKG Interpretation  Date/Time:  Thursday June 24 2016 13:22:09 EDT Ventricular Rate:  63 PR Interval:  170 QRS Duration: 102 QT  Interval:  378 QTC Calculation: 386 R Axis:   50 Text Interpretation:  Normal sinus rhythm Left ventricular hypertrophy with repolarization abnormality Abnormal ECG No significant change since last tracing Confirmed by Ethelda Chick  MD, Conna Terada [phone]) on 06/24/2016 3:06:28 PM       Radiology Ct Head Wo Contrast  Result Date: 06/24/2016 CLINICAL DATA:  Altered mental status.  Evaluate for hemorrhage. EXAM: CT HEAD WITHOUT CONTRAST TECHNIQUE: Contiguous axial images were obtained from the base of the skull through the vertex without intravenous contrast. COMPARISON:  01/09/2016 FINDINGS: Brain: Acute hemorrhage in the high right frontal lobe. Along the anterior and lateral margins of the high-density clot is low-density that appears encapsulated,  possibly unclotted blood/serum. Overall dimensions are 60 x 30 x 36 mm. There is a rim of surrounding edema and mild local subarachnoid extension. Midline shift measures up to 4 mm. No intraventricular extension or entrapment. Gliosis in the left occipital lobe is related to lobar hemorrhage in 2016. Small-vessel ischemic changes in the cerebral white matter. No noted generalized hemorrhagic changes in the cerebral hemispheres on 2016 MPGR, although amyloid angiopathy is still possible. Hypertension or hemorrhagic infarct could also present with this appearance. Vascular: Atherosclerotic calcification. Skull: Normal. Negative for fracture or focal lesion. Sinuses/Orbits: Gaze to the right. Other: Critical Value/emergent results were called by telephone at the time of interpretation on 06/24/2016 at 2:36 pm to Dr. Ethelda Chick, who verbally acknowledged these results. IMPRESSION: 1. ~32 cc right frontal acute parenchymal hematoma. Mild local subarachnoid extension. 4 mm midline shift. 2. Gliosis from remote left occipital lobar hemorrhage in 2016. Possible amyloid angiopathy, but no typical chronic hemorrhagic foci on 2016 brain MRI. Question hemorrhagic conversion of infarct. Electronically Signed   By: Marnee Spring M.D.   On: 06/24/2016 14:42    Procedures Procedures (including critical care time)  Medications Ordered in ED Medications - No data to display   Initial Impression / Assessment and Plan / ED Course  I have reviewed the triage vital signs and the nursing notes.  Pertinent labs & imaging results that were available during my care of the patient were reviewed by me and considered in my medical decision making (see chart for details).  Clinical Course    NIH stroke scale calculated at 17. I spoke with Dr. Yetta Barre, neurosurgery who feels the patient is not a candidate for neurosurgical intervention and requests that I consult neurology service for admission. Neurology service consulted and  made arrangements for admission to intensive care unit. At 4 PM patient's exam is unchanged. He remains alert and awake with rightward gaze  Final Clinical Impressions(s) / ED Diagnoses  Diagnosis acute hemorrhagic stroke Final diagnoses:  None   CRITICAL CARE Performed by: Doug Sou Total critical care time: 30 minutes Critical care time was exclusive of separately billable procedures and treating other patients. Critical care was necessary to treat or prevent imminent or life-threatening deterioration. Critical care was time spent personally by me on the following activities: development of treatment plan with patient and/or surrogate as well as nursing, discussions with consultants, evaluation of patient's response to treatment, examination of patient, obtaining history from patient or surrogate, ordering and performing treatments and interventions, ordering and review of laboratory studies, ordering and review of radiographic studies, pulse oximetry and re-evaluation of patient's condition. New Prescriptions New Prescriptions   No medications on file     Doug Sou, MD 06/24/16 1610

## 2016-06-24 NOTE — ED Notes (Signed)
Notified Neurology, MD patient's current bp. Will continue to monitor vitals trend.

## 2016-06-24 NOTE — H&P (Signed)
History and physical    Chief Complaint: ich    HPI:                                                                                                                                         Austin Sawyer is an 81 y.o. male with a flutter, history of stroke, diabetes, dementia, currently on no anticoagulation secondary to previous intracranial hemorrhage and the arachnoid hemorrhage back in 2016. Patient presents to the hospital with confusion. Head CT was obtained and showed a acute hemorrhage in the right frontal lobe. Overall dimensions are 60 x 30 x 36 mm. There is a midline shift that measures up to 4 mm. There is no intraventricular extension.  Date last known well: Unable to determine Time last known well: Unable to determine tPA Given: No: ICH   Past Medical History:  Diagnosis Date  . Alcohol abuse, in remission   . Atrial flutter (HCC)   . Back pain   . Cervicalgia   . Coronary artery disease   . Coronary atherosclerosis of unspecified type of vessel, native or graft   . Dementia   . Depression   . Diabetes mellitus without complication (HCC)   . Disturbance of skin sensation   . Hypertension   . MI, old   . Reflux   . Stroke (HCC)   . Unspecified hearing loss   . Unspecified sleep apnea     Past Surgical History:  Procedure Laterality Date  . HERNIA REPAIR    . ROTATOR CUFF REPAIR      Family History  Problem Relation Age of Onset  . Anxiety disorder Mother   . Cancer Mother    Social History:  reports that he has quit smoking. He has never used smokeless tobacco. He reports that he does not drink alcohol or use drugs.  Allergies: No Known Allergies  Medications:                                                                                                                            Current Facility-Administered Medications  Medication Dose Route Frequency Provider Last Rate Last Dose  . levETIRAcetam (KEPPRA) 1,000 mg in sodium chloride 0.9 %  100 mL IVPB  1,000 mg Intravenous STAT Ulice Dashavid R Smith, PA-C      . levETIRAcetam (KEPPRA) 500 mg in sodium chloride  0.9 % 100 mL IVPB  500 mg Intravenous Q12H Ulice Dash, PA-C      . nicardipine (CARDENE) 20mg  in 0.86% saline IV infusion (0.1 mg/ml)  3-15 mg/hr Intravenous Continuous Ulice Dash, PA-C 50 mL/hr at 06/24/16 1557 5 mg/hr at 06/24/16 1557   Current Outpatient Prescriptions  Medication Sig Dispense Refill  . allopurinol (ZYLOPRIM) 300 MG tablet Take 300 mg by mouth daily. For gout    . aspirin EC 81 MG tablet Take 1 tablet (81 mg total) by mouth daily.    Marland Kitchen atorvastatin (LIPITOR) 10 MG tablet Take 1 tablet (10 mg total) by mouth daily at 6 PM. (Patient taking differently: Take 80 mg by mouth daily at 6 PM. ) 30 tablet 1  . carvedilol (COREG) 25 MG tablet Take 1 tablet (25 mg total) by mouth 2 (two) times daily with a meal. (Patient taking differently: Take 25 mg by mouth daily. ) 60 tablet 1  . cloNIDine (CATAPRES) 0.1 MG tablet Take 0.1 mg by mouth daily.    . DULoxetine 40 MG CPEP Take 40 mg by mouth daily. (Patient taking differently: Take 20 mg by mouth daily. ) 30 capsule 1  . gabapentin (NEURONTIN) 300 MG capsule Take 300 mg by mouth daily.    . hydrALAZINE (APRESOLINE) 50 MG tablet Take 1 tablet (50 mg total) by mouth every 8 (eight) hours. (Patient taking differently: Take 50 mg by mouth daily. ) 90 tablet 1  . hydrochlorothiazide (HYDRODIURIL) 25 MG tablet Take 12.5 mg by mouth daily.    Marland Kitchen HYDROcodone-acetaminophen (NORCO/VICODIN) 5-325 MG tablet Take 1 tablet by mouth every 6 (six) hours as needed for moderate pain. 60 tablet 0  . lisinopril (PRINIVIL,ZESTRIL) 20 MG tablet Take 1 tablet (20 mg total) by mouth 2 (two) times daily. (Patient taking differently: Take 20 mg by mouth daily. ) 60 tablet 1  . metFORMIN (GLUCOPHAGE) 500 MG tablet Take 500 mg by mouth daily.     . mirtazapine (REMERON) 15 MG tablet Take 15 mg by mouth at bedtime.     Marland Kitchen OVER THE COUNTER  MEDICATION Take 8-10 drops by mouth See admin instructions. Cell Memory Drops - Mix 8-10 drops in 32oz of water and drink daily.    . QUEtiapine (SEROQUEL) 25 MG tablet Take 1 tablet (25 mg total) by mouth at bedtime. 30 tablet 1  . tamsulosin (FLOMAX) 0.4 MG CAPS capsule Take 0.4 mg by mouth daily.     Marland Kitchen acetaminophen (TYLENOL) 325 MG tablet Take 2 tablets (650 mg total) by mouth every 4 (four) hours as needed for mild pain (or temp > 99 F). (Patient not taking: Reported on 06/24/2016)    . albuterol (PROVENTIL) (2.5 MG/3ML) 0.083% nebulizer solution Take 3 mLs (2.5 mg total) by nebulization 2 (two) times daily as needed for wheezing or shortness of breath. 75 mL 12  . amLODipine (NORVASC) 5 MG tablet Take 1 tablet (5 mg total) by mouth daily. (Patient not taking: Reported on 06/24/2016) 30 tablet 1  . Multiple Vitamin (MULTIVITAMIN WITH MINERALS) TABS tablet Take 1 tablet by mouth daily. (Patient not taking: Reported on 06/24/2016)    . naproxen (NAPROSYN) 500 MG tablet Take 1 tablet (500 mg total) by mouth 2 (two) times daily. (Patient not taking: Reported on 06/24/2016) 30 tablet 0  . nitroGLYCERIN (NITROSTAT) 0.4 MG SL tablet Place 0.4 mg under the tongue every 5 (five) minutes as needed. Chest pain      ROS:  History obtained from unobtainable from patient due to language barrier  Neurologic Examination:                                                                                                      Blood pressure 180/90, pulse 69, temperature 98.1 F (36.7 C), resp. rate 18, SpO2 99 %.  HEENT-  Normocephalic, no lesions, without obvious abnormality.  Normal external eye and conjunctiva.  Normal TM's bilaterally.  Normal auditory canals and external ears. Normal external nose, mucus membranes and septum.  Normal pharynx. Cardiovascular- S1, S2 normal, pulses  palpable throughout   Lungs- chest clear, no wheezing, rales, normal symmetric air entry, Heart exam - S1, S2 normal, no murmur, no gallop, rate regular Abdomen- normal findings: bowel sounds normal Extremities- no edema Lymph-no adenopathy palpable Musculoskeletal-no joint tenderness, deformity or swelling Skin-warm and dry, no hyperpigmentation, vitiligo, or suspicious lesions  Neurological Examination Mental Status: Patient is alert and is able to say hello when his name is spoken but however appears to be seeing objects on the other side of the wall. There is also a aspect of perseveration in addition to both mild expressive and receptive aphasia. He is unable to follow commands. Cranial Nerves: II:  left field cut, pupils equal, round, reactive to light and accommodation III,IV, VI: ptosis not present, eyes are deviated to the right and do not cross midline with head thrust. V,VII: Left facial droop, facial light touch sensation normal bilaterally VIII: hearing normal bilaterally IX,X: uvula rises symmetrically XI: bilateral shoulder shrug XII: midline tongue extension Motor: Right : Upper extremity   5/5    Left:     Upper extremity   4/5  Lower extremity   5/5     Lower extremity   3/5 Tone and bulk:normal tone throughout; no atrophy noted Sensory: Pinprick and light touch intact throughout, bilaterally Deep Tendon Reflexes: 2+ and symmetric throughout Plantars: Right: downgoing   Left: downgoing Cerebellar: Unable to assess Gait: Able to assess       Lab Results: Basic Metabolic Panel:  Recent Labs Lab 06/24/16 1352 06/24/16 1400  NA 137 139  K 4.2 4.2  CL 102 101  CO2 25  --   GLUCOSE 111* 105*  BUN 17 20  CREATININE 1.12 1.20  CALCIUM 9.8  --     Liver Function Tests:  Recent Labs Lab 06/24/16 1352  AST 19  ALT 17  ALKPHOS 64  BILITOT 0.7  PROT 8.1  ALBUMIN 4.2   No results for input(s): LIPASE, AMYLASE in the last 168 hours. No results for  input(s): AMMONIA in the last 168 hours.  CBC:  Recent Labs Lab 06/24/16 1352 06/24/16 1400  WBC 13.1*  --   NEUTROABS 10.6*  --   HGB 13.9 15.3  HCT 42.4 45.0  MCV 90.2  --   PLT 202  --     Cardiac Enzymes: No results for input(s): CKTOTAL, CKMB, CKMBINDEX, TROPONINI in the last 168 hours.  Lipid Panel: No results for input(s): CHOL, TRIG, HDL, CHOLHDL, VLDL, LDLCALC in the last 168  hours.  CBG:  Recent Labs Lab 06/24/16 1328  GLUCAP 114*    Microbiology: Results for orders placed or performed during the hospital encounter of 02/08/15  MRSA PCR Screening     Status: None   Collection Time: 02/08/15  9:53 PM  Result Value Ref Range Status   MRSA by PCR NEGATIVE NEGATIVE Final    Comment:        The GeneXpert MRSA Assay (FDA approved for NASAL specimens only), is one component of a comprehensive MRSA colonization surveillance program. It is not intended to diagnose MRSA infection nor to guide or monitor treatment for MRSA infections.   Culture, respiratory (NON-Expectorated)     Status: None   Collection Time: 02/11/15 11:36 AM  Result Value Ref Range Status   Specimen Description TRACHEAL ASPIRATE  Final   Special Requests NONE  Final   Gram Stain   Final    NO WBC SEEN NO SQUAMOUS EPITHELIAL CELLS SEEN FEW GRAM POSITIVE COCCI IN PAIRS IN CHAINS RARE GRAM NEGATIVE RODS Performed at [facility]    Culture   Final    FEW CANDIDA ALBICANS Performed at [facility]    Report Status 02/13/2015 FINAL  Final  Culture, Urine     Status: None   Collection Time: 02/15/15  2:50 PM  Result Value Ref Range Status   Specimen Description URINE, CATHETERIZED  Final   Special Requests Normal  Final   Colony Count NO GROWTH Performed at [facility]   Final   Culture NO GROWTH Performed at [facility]   Final   Report Status 02/16/2015 FINAL  Final    Coagulation Studies:  Recent Labs  06/24/16 1352  LABPROT 13.8   INR 1.06    Imaging: Ct Head Wo Contrast  Result Date: 06/24/2016 CLINICAL DATA:  Altered mental status.  Evaluate for hemorrhage. EXAM: CT HEAD WITHOUT CONTRAST TECHNIQUE: Contiguous axial images were obtained from the base of the skull through the vertex without intravenous contrast. COMPARISON:  01/09/2016 FINDINGS: Brain: Acute hemorrhage in the high right frontal lobe. Along the anterior and lateral margins of the high-density clot is low-density that appears encapsulated, possibly unclotted blood/serum. Overall dimensions are 60 x 30 x 36 mm. There is a rim of surrounding edema and mild local subarachnoid extension. Midline shift measures up to 4 mm. No intraventricular extension or entrapment. Gliosis in the left occipital lobe is related to lobar hemorrhage in 2016. Small-vessel ischemic changes in the cerebral white matter. No noted generalized hemorrhagic changes in the cerebral hemispheres on 2016 MPGR, although amyloid angiopathy is still possible. Hypertension or hemorrhagic infarct could also present with this appearance. Vascular: Atherosclerotic calcification. Skull: Normal. Negative for fracture or focal lesion. Sinuses/Orbits: Gaze to the right. Other: Critical Value/emergent results were called by telephone at the time of interpretation on 06/24/2016 at 2:36 pm to Dr. Ethelda Chick, who verbally acknowledged these results. IMPRESSION: 1. ~32 cc right frontal acute parenchymal hematoma. Mild local subarachnoid extension. 4 mm midline shift. 2. Gliosis from remote left occipital lobar hemorrhage in 2016. Possible amyloid angiopathy, but no typical chronic hemorrhagic foci on 2016 brain MRI. Question hemorrhagic conversion of infarct. Electronically Signed   By: Marnee Spring M.D.   On: 06/24/2016 14:42       Assessment and plan discussed with with attending physician and they are in agreement.    Felicie Morn PA-C [facility] [phone]  06/24/2016, 3:55 PM    Assessment: 81 y.o. male with acute  hemorrhagic right frontal lobe bleed. Currently his blood pressure is elevated at 192/101 and a Cardene drip is being initiated. Patient will be admitted by the stroke team and transferred to ICU. Keppra has been bolused at 1000 mg and to be continued at 500 mg as his hemorrhage is very cortical. In addition EEG will be obtained. Nontraumatic ICH order set has been ordered. Stroke team will follow in the morning.  Stroke Risk Factors - hyperlipidemia and hypertension  Plan: 1-admitted to ICU 2-seizure precautions 3-no anticoagulation or antiplatelet 4-EEG 5-bolus Keppra 1000 mg followed by 500 mg IV every 12 hours 6-every hour neuro checks 7-maintain systolic blood pressure at 140 or below. We will use Cardene for this.

## 2016-06-24 NOTE — ED Triage Notes (Signed)
Pt brought in by his daughter with c/o altered mental status and right sided gaze to both eyes. Pt alert to self only. This confusion is not normal for him. Pts daughter reports he has been having trouble walking and getting around for about a week. The right sided gaze started either last night or this morning when pt woke up - daughter not entirely sure.

## 2016-06-25 ENCOUNTER — Inpatient Hospital Stay (HOSPITAL_COMMUNITY): Payer: Medicare Other

## 2016-06-25 DIAGNOSIS — G9340 Encephalopathy, unspecified: Secondary | ICD-10-CM

## 2016-06-25 DIAGNOSIS — I611 Nontraumatic intracerebral hemorrhage in hemisphere, cortical: Principal | ICD-10-CM

## 2016-06-25 LAB — MRSA PCR SCREENING: MRSA BY PCR: NEGATIVE

## 2016-06-25 MED ORDER — MORPHINE SULFATE (PF) 2 MG/ML IV SOLN
INTRAVENOUS | Status: AC
  Administered 2016-06-25: 2 mg via INTRAVENOUS
  Filled 2016-06-25: qty 1

## 2016-06-25 MED ORDER — MORPHINE SULFATE (PF) 2 MG/ML IV SOLN: 2.0000 mg | INTRAVENOUS | Status: DC | PRN

## 2016-06-25 MED ORDER — RESOURCE THICKENUP CLEAR PO POWD
ORAL | Status: DC | PRN
  Filled 2016-06-25: qty 125

## 2016-06-25 NOTE — Progress Notes (Signed)
PT Cancellation Note  Patient Details Name: Austin Sawyer MRN: [id number] DOB: 08/09/1935   Cancelled Treatment:    Reason Eval/Treat Not Completed: Patient not medically ready. Active bedrest orders    [name], [name] J 06/25/2016, 9:25 AM [name], PT DPT  [phone]

## 2016-06-25 NOTE — Evaluation (Signed)
Physical Therapy Evaluation Patient Details Name: Austin Sawyer MRN: [id number] DOB: 08/04/1935 Today's Date: 06/25/2016   History of Present Illness  Austin Sawyer is an 81 y.o. male with a flutter, history of stroke, cervicalgia, in remission from ETOH abuse, diabetes, dementia, intracranial hemorrhage, arachnoid hemorrhage back in 2016. Patient presents to the hospital with confusion. Head CT was obtained and showed a acute hemorrhage in the right frontal lobe. Per chart tere is a midline shift that measures up to 4 mm  Clinical Impression  Patient demonstrates deficits in functional mobility as indicated below. Will need continued skilled PT to address deficits and maixmize function. Will see as indicated and progress as tolerated. Feel patient would benefit from comprehensive rehabilitation post acute discharge. Per LCSW notations, family desires SNF.    Follow Up Recommendations SNF    Equipment Recommendations   (defer to next venue)    Recommendations for Other Services       Precautions / Restrictions Precautions Precautions: Fall Restrictions Weight Bearing Restrictions: No      Mobility  Bed Mobility Overal bed mobility: Needs Assistance;+2 for physical assistance Bed Mobility: Supine to Sit;Sit to Supine     Supine to sit: Max assist;+2 for physical assistance Sit to supine: Mod assist   General bed mobility comments: 2 person assist to come to EOB for safety and positioning. Patient with difficulty carrying out task. Moderate assist to return to supine with initiation of movement  Transfers Overall transfer level: Needs assistance Equipment used: 2 person hand held assist Transfers: Sit to/from Stand Sit to Stand: Mod assist;+2 physical assistance         General transfer comment: moderate assist to power up to stand with tactile cues for upright posture, increase time and effort, Multi modal cues to initiate movement  Ambulation/Gait Ambulation/Gait  assistance: Max assist;+2 physical assistance Ambulation Distance (Feet): 2 Feet Assistive device: 2 person hand held assist Gait Pattern/deviations: Shuffle;Step-to pattern     General Gait Details: attempted [facility] steps [facility], patient unable to clear LLE from the ground  Stairs            Wheelchair Mobility    Modified Rankin (Stroke Patients Only) Modified Rankin (Stroke Patients Only) Pre-Morbid Rankin Score: No symptoms Modified Rankin: Severe disability     Balance Overall balance assessment: Needs assistance Sitting-balance support: Feet supported Sitting balance-Leahy Scale: Fair     Standing balance support: During functional activity Standing balance-Leahy Scale: Poor                               Pertinent Vitals/Pain Pain Assessment: No/denies pain    Home Living Family/patient expects to be discharged to:: Skilled nursing facility Living Arrangements: Other (Comment) (unknown)               Additional Comments: unknown    Prior Function           Comments: unknown     Hand Dominance   Dominant Hand: Right    Extremity/Trunk Assessment               Lower Extremity Assessment: Difficult to assess due to impaired cognition         Communication   Communication: HOH  Cognition Arousal/Alertness: Awake/alert Behavior During Therapy: Impulsive Overall Cognitive Status: No family/caregiver present to determine baseline cognitive functioning  General Comments      Exercises     Assessment/Plan    PT Assessment Patient needs continued PT services  PT Problem List Decreased strength;Decreased activity tolerance;Decreased balance;Decreased mobility;Decreased coordination;Decreased cognition;Decreased knowledge of use of DME;Decreased safety awareness          PT Treatment Interventions DME instruction;Gait training;Stair training;Functional mobility training;Therapeutic  activities;Therapeutic exercise;Balance training;Cognitive remediation;Patient/family education    PT Goals (Current goals can be found in the Care Plan section)  Acute Rehab PT Goals PT Goal Formulation: Patient unable to participate in goal setting Time For Goal Achievement: 07/09/16 Potential to Achieve Goals: Fair    Frequency Min 3X/week   Barriers to discharge        Co-evaluation PT/OT/SLP Co-Evaluation/Treatment: Yes Reason for Co-Treatment: Complexity of the patient's impairments (multi-system involvement);Necessary to address cognition/behavior during functional activity;For patient/therapist safety PT goals addressed during session: Mobility/safety with mobility         End of Session   Activity Tolerance: Patient limited by fatigue Patient left: in bed;with call bell/phone within reach;with bed alarm set;with SCD's reapplied Nurse Communication: Mobility status         Time: 1610-9604 PT Time Calculation (min) (ACUTE ONLY): 26 min   Charges:         PT G CodesFabio Asa 07-10-16, 4:50 PM Charlotte Crumb, PT DPT  [phone]

## 2016-06-25 NOTE — Procedures (Signed)
HPI:  81 y/o with MS change and R frontal hematoma with subarachnoid extension  TECHNICAL SUMMARY:  A multichannel referential and bipolar montage EEG using the standard international 10-20 system was performed on the patient described as confused, but also awake and drowsy.  The dominant background activity consists of 6-7 hertz activity seen most prominantly over the posterior head region.  Cannot determine reactivity of the background to eye opening and closing procedures, as this was not performed.    Low voltage fast (beta) activity is distributed symmetrically and maximally over the anterior head regions.  ACTIVATION:  Stepwise photic stimulation and hyperventilation was not performed  EPILEPTIFORM ACTIVITY:  There were no spikes, sharp waves or paroxysmal activity.  SLEEP:  Stage I sleep architecture is noted, but no stage II sleep.  IMPRESSION:  This is an abnormal EEG demonstrating a mild diffuse slowing of electrocerebral activity.  This can be seen in a wide variety of encephalopathic state including those of a toxic, metabolic, or degenerative nature.  There were no focal, hemispheric, or lateralizing features.  No epileptiform activity was recorded.

## 2016-06-25 NOTE — Clinical Social Work Note (Signed)
Clinical Social Work Assessment  Patient Details  Name: Austin Sawyer MRN: [id number] Date of Birth: September 28, 1935  Date of referral:  06/25/16               Reason for consult:  Family Concerns, Facility Placement                Permission sought to share information with:    Permission granted to share information::     Name::     Anik Wesch   Agency::     Relationship::  Spouse  Contact Information:  [phone]  Housing/Transportation Living arrangements for the past 2 months:  Single Family Home (Patient living between his home and daughters home in the last 4 weeks) Source of Information:  Spouse Patient Interpreter Needed:  None Criminal Activity/Legal Involvement Pertinent to Current Situation/Hospitalization:  No - Comment as needed Significant Relationships:  Spouse, Adult Children Lives with:  Adult Children, Spouse (Patient has lived with daughter for the last 3 weeks. Prior to that patient lived with spouse.) Do you feel safe going back to the place where you live?    Need for family participation in patient care:  Yes (Comment)  Care giving concerns: Patients wife reports that patient was brought to the hospital without her knowledge. Patient wife states that there is a strain between children and their mother. Patient wife would like to confirm that patient and her are still married and she is primary Media planner unless paperwork is provided.    Social Worker assessment / plan:  Holiday representative met with patients spouse outside of room to offer support and address patients spouse concerns regarding decision making. Patient spouse states that her and patient have been married 105 years and have three children together. Patient son brought patient to patient's daughters home where he has been living the past four weeks. Patients wife states that she told patient's son "if he wants to live with his daughter, let him." Patient wife states that her gut told her to call the  hospital and identified that patient was admitted. Patient wife states that patient has been to [facility] in the past and would prefer he return if necessary at discharge. Patient daughter inquired about the possibility of [facility]- RNCM notified and will follow up with family. Patient spouse not agreeable to transfer at this time. CSW remains available for support and discharge planning needs.  Employment status:  Retired Forensic scientist:  Medicare PT Recommendations:  Not assessed at this time Information / Referral to community resources:  [place], Other (Comment Required) (Discussed POA paperwork)  Patient/Family's Response to care: Patient wife verbalized understanding of CSW role and appreciation for support and concern. Patient wife hopeful that patient will either return home or go back to [facility] at discharge.     Patient/Family's Understanding of and Emotional Response to Diagnosis, Current Treatment, and Prognosis:  Patient wife with limited information regarding patient current medical condition due to recent arrival at bedside. Patient children not willing to share information with patient wife. Patient wife hopeful to speak with MD and request that she be notified in moving forward with patient care.  Emotional Assessment Appearance:  Appears stated age Attitude/Demeanor/Rapport:  Unable to Assess Affect (typically observed):  Unable to Assess Orientation:  Oriented to Self Alcohol / Substance use:  Not Applicable Psych involvement (Current and /or in the community):  No (Comment)  Discharge Needs  Concerns to be addressed:  Care Coordination, Decision making  concerns Readmission within the last 30 days:  No Current discharge risk:  None Barriers to Discharge:  Continued Medical Work up   [employer], LCSW 06/25/2016, 2:17 PM

## 2016-06-25 NOTE — Progress Notes (Signed)
Nutrition Brief Note  Patient identified on the Malnutrition Screening Tool (MST) Report  Wt Readings from Last 15 Encounters:  06/24/16 204 lb 5.9 oz (92.7 kg)  05/24/16 221 lb 9.6 oz (100.5 kg)  03/13/16 245 lb (111.1 kg)  01/09/16 213 lb 3 oz (96.7 kg)  11/24/15 209 lb 6.4 oz (95 kg)  08/18/15 204 lb 3.2 oz (92.6 kg)  05/14/15 202 lb 3.2 oz (91.7 kg)  04/15/15 212 lb (96.2 kg)  02/24/15 204 lb (92.5 kg)  02/20/15 204 lb 9.4 oz (92.8 kg)  03/15/14 227 lb (103 kg)  09/14/13 220 lb (99.8 kg)  07/12/13 217 lb (98.4 kg)  04/22/13 220 lb (99.8 kg)  10/04/12 220 lb (99.8 kg)    Body mass index is 30.18 kg/m. Patient meets criteria for class I obesity based on current BMI. Weight has been fluctuating, however stable.   No family at bedside. Current diet order is NPO. Pt reports eating well PTA with usual consumption of at least 3 meals a day with no other difficulties. Labs and medications reviewed.   No nutrition interventions warranted at this time. If nutrition issues arise, please consult RD.   Austin SmilingStephanie Ava Tangney, MS, RD, LDN Pager # [phone] After hours/ weekend pager # [phone]

## 2016-06-25 NOTE — Care Management Note (Signed)
Case Management Note  Patient Details  Name: Austin Sawyer MRN: [id number] Date of Birth: 07/10/1935  Subjective/Objective:      Admitted with ICH            Action/Plan: Received message from Robb MatarJessie Soc Worker that family wants [facility] and daughter [name] is the POA, pt separated from spouse. TCT [place], stated that she is the POA and wants pt transferred to the [facility]; [name] [place] [facility] [place] called and updated. Patient is still undergoing workup for ICH/ not medically stable for dc at this time. [facility] will [facility] in contact the CM on the patient's medically status. CM in room, wife at bedside very upset that the family did not contact her of the patient's admission. Lots of family issues. Emotional support given to the spouse. Spouse stated that they are not separated and are still together. Brayton CavesJessie Soc Worker called and updated.  Expected Discharge Date:     TBD             Expected Discharge Plan:  IP Rehab Facility  Discharge planning Services  CM Consult    Choice offered to:  Spouse, Adult Children     Status of Service:  In process, will continue to follow  Reola MosherChandler, An Schnabel L, RN,MHA,BSN [phone] 06/25/2016, 12:18 PM

## 2016-06-25 NOTE — Evaluation (Addendum)
Occupational Therapy Evaluation Patient Details Name: Austin Sawyer MRN: [id number] DOB: 11/06/1934 Today's Date: 06/25/2016    History of Present Illness Austin Sawyer is an 81 y.o. male with a flutter, history of stroke, cervicalgia, in remission from ETOH abuse, diabetes, dementia, intracranial hemorrhage, arachnoid hemorrhage back in 2016. Patient presents to the hospital with confusion. Head CT was obtained and showed a acute hemorrhage in the right frontal lobe. Per chart tere is a midline shift that measures up to 4 mm   Clinical Impression   Pt currently requires mod assist +2 for basic transfers and min guard for washing face while sitting EOB with max verbal cues for sequencing and initiation. Pt presenting with impaired cognition, visual deficits, and decreased UE strength/ROM impacting his independence with ADL and functional mobility. Recommending SNF for follow up to maximize independence and safety with ADL and functional mobility. Pt would benefit from continued skilled OT to address established goals.    Follow Up Recommendations  SNF;Supervision/Assistance - 24 hour    Equipment Recommendations  Other (comment) (TBD at next venue)    Recommendations for Other Services       Precautions / Restrictions Precautions Precautions: Fall Restrictions Weight Bearing Restrictions: No      Mobility Bed Mobility Overal bed mobility: Needs Assistance;+2 for physical assistance Bed Mobility: Supine to Sit;Sit to Supine     Supine to sit: Max assist;+2 for physical assistance Sit to supine: Mod assist   General bed mobility comments: 2 person assist to come to EOB for safety and positioning. Patient with difficulty carrying out task. Moderate assist to return to supine with initiation of movement  Transfers Overall transfer level: Needs assistance Equipment used: 2 person hand held assist Transfers: Sit to/from Stand Sit to Stand: Mod assist;+2 physical assistance          General transfer comment: moderate assist to power up to stand with tactile cues for upright posture, increase time and effort, Multi modal cues to initiate movement    Balance Overall balance assessment: Needs assistance Sitting-balance support: Feet supported Sitting balance-Leahy Scale: Fair     Standing balance support: During functional activity Standing balance-Leahy Scale: Poor                              ADL Overall ADL's : Needs assistance/impaired     Grooming: Min guard;Sitting;Wash/dry face;Cueing for sequencing Grooming Details (indicate cue type and reason): Max verbal cues for sequencing and initiation. Pt only using L hand despite cues. Upper Body Bathing: Maximal assistance;Sitting   Lower Body Bathing: Maximal assistance;Sit to/from stand;+2 for physical assistance   Upper Body Dressing : Moderate assistance;Sitting   Lower Body Dressing: Maximal assistance;+2 for physical assistance;Sit to/from stand                 General ADL Comments: Mod assist +2 for sit to stand from EOB with side stepping [facility] to [facility] for [facility]ing. Max verbal cues to sequence and initiate     Vision Additional Comments: Difficult to assess due to impaired cognition. Disconjugate gaze noted. Further visual assessment needed.   Perception     Praxis      Pertinent Vitals/Pain Pain Assessment: No/denies pain     Hand Dominance Right   Extremity/Trunk Assessment Upper Extremity Assessment Upper Extremity Assessment: LUE deficits/detail;Difficult to assess due to impaired cognition LUE Deficits / Details: Grossly 3+/5. Difficult to assess   Lower Extremity Assessment Lower Extremity  Assessment: Defer to PT evaluation       Communication Communication Communication: HOH   Cognition Arousal/Alertness: Awake/alert Behavior During Therapy: Impulsive Overall Cognitive Status: No family/caregiver present to determine baseline cognitive functioning                      General Comments       Exercises       Shoulder Instructions      Home Living Family/patient expects to be discharged to:: Skilled nursing facility Living Arrangements: Other (Comment) (unknown)                               Additional Comments: unknown      Prior Functioning/Environment          Comments: unknown        OT Problem List: Decreased strength;Decreased range of motion;Decreased activity tolerance;Impaired balance (sitting and/or standing);Impaired vision/perception;Decreased coordination;Decreased cognition;Decreased safety awareness;Decreased knowledge of use of DME or AE;Decreased knowledge of precautions;Impaired UE functional use   OT Treatment/Interventions: Self-care/ADL training;Therapeutic exercise;Neuromuscular education;Energy conservation;DME and/or AE instruction;Therapeutic activities;Cognitive remediation/compensation;Visual/perceptual remediation/compensation;Patient/family education;Balance training    OT Goals(Current goals can be found in the care plan section) Acute Rehab OT Goals Patient Stated Goal: none stated OT Goal Formulation: With patient Time For Goal Achievement: 07/09/16 Potential to Achieve Goals: Good ADL Goals Pt Will Perform Grooming: with min assist;standing Pt Will Perform Upper Body Bathing: with set-up;sitting Pt Will Perform Lower Body Bathing: with min assist;sit to/from stand Pt Will Transfer to Toilet: ambulating;bedside commode;with min assist Pt/caregiver will Perform Home Exercise Program: Increased ROM;Increased strength;Both right and left upper extremity;With Supervision Additional ADL Goal #1: Pt will follow one step commands 75% of the time with min verbal cues.  OT Frequency: Min 2X/week   Barriers to D/C:            Co-evaluation PT/OT/SLP Co-Evaluation/Treatment: Yes Reason for Co-Treatment: Complexity of the patient's impairments (multi-system  involvement);Necessary to address cognition/behavior during functional activity;For patient/therapist safety PT goals addressed during session: Mobility/safety with mobility OT goals addressed during session: ADL's and self-care      End of Session Nurse Communication: Mobility status;Other (comment) (condom cath off)  Activity Tolerance: Patient tolerated treatment well Patient left: in bed;with call bell/phone within reach;with bed alarm set;with SCD's reapplied   Time: 0092-3300 OT Time Calculation (min): 26 min Charges:  OT General Charges $OT Visit: 1 Procedure OT Evaluation $OT Eval Moderate Complexity: 1 Procedure G-Codes:     Gaye Alken M.S., OTR/L Pager: [phone]  06/25/2016, 5:27 PM

## 2016-06-25 NOTE — Progress Notes (Signed)
Bedside EEG completed, results pending. 

## 2016-06-25 NOTE — Progress Notes (Signed)
STROKE TEAM PROGRESS NOTE   HISTORY OF PRESENT ILLNESS (per record) Austin Sawyer is an 81 y.o. male with aflutter, history of stroke, diabetes, dementia, currently on no anticoagulation secondary to previous intracranial hemorrhage and subarachnoid hemorrhage back in 2016. Patient presents to the hospital with confusion. Head CT was obtained and showed a acute hemorrhage in the right frontal lobe. Overall dimensions are 60 x 30 x 36 mm. There is a midline shift that measures up to 4 mm. There is no intraventricular extension. Last known well is unable to be determined. Patient was not administered IV t-PA secondary to ICH. He was admitted to the neuro ICU for further evaluation and treatment.   SUBJECTIVE (INTERVAL HISTORY) Daughter at bedside. Patient has not been compliant with his medication recently and daughter is not sure he has been taking any of his medications. Discussed this could be hypertensive given his noncompliance and his elevated BP on admission but other causes need to be evaluated.   OBJECTIVE Temp:  [98.1 F (36.7 C)-99.5 F (37.5 C)] 98.9 F (37.2 C) (09/22 0800) Pulse Rate:  [61-123] 73 (09/22 0730) Cardiac Rhythm: Normal sinus rhythm (09/22 0730) Resp:  [13-28] 19 (09/22 0730) BP: (98-192)/(51-145) 135/70 (09/22 0730) SpO2:  [91 %-100 %] 95 % (09/22 0730) FiO2 (%):  [21 %] 21 % (09/21 2309) Weight:  [92.7 kg (204 lb 5.9 oz)] 92.7 kg (204 lb 5.9 oz) (09/21 2245)  CBC:  Recent Labs Lab 06/24/16 1352 06/24/16 1400  WBC 13.1*  --   NEUTROABS 10.6*  --   HGB 13.9 15.3  HCT 42.4 45.0  MCV 90.2  --   PLT 202  --     Basic Metabolic Panel:  Recent Labs Lab 06/24/16 1352 06/24/16 1400  NA 137 139  K 4.2 4.2  CL 102 101  CO2 25  --   GLUCOSE 111* 105*  BUN 17 20  CREATININE 1.12 1.20  CALCIUM 9.8  --     Lipid Panel:    Component Value Date/Time   CHOL 178 02/09/2015 0745   TRIG 121 02/12/2015 1141   HDL 62 02/09/2015 0745   CHOLHDL 2.9 02/09/2015  0745   VLDL 9 02/09/2015 0745   LDLCALC 107 (H) 02/09/2015 0745   HgbA1c:  Lab Results  Component Value Date   HGBA1C 6.0 (H) 02/09/2015   Urine Drug Screen:    Component Value Date/Time   LABOPIA NONE DETECTED 02/08/2015 1823   COCAINSCRNUR NONE DETECTED 02/08/2015 1823   LABBENZ NONE DETECTED 02/08/2015 1823   AMPHETMU NONE DETECTED 02/08/2015 1823   THCU NONE DETECTED 02/08/2015 1823   LABBARB NONE DETECTED 02/08/2015 1823      IMAGING  Ct Head Wo Contrast  Result Date: 06/24/2016 CLINICAL DATA:  Altered mental status.  Evaluate for hemorrhage. EXAM: CT HEAD WITHOUT CONTRAST TECHNIQUE: Contiguous axial images were obtained from the base of the skull through the vertex without intravenous contrast. COMPARISON:  01/09/2016 FINDINGS: Brain: Acute hemorrhage in the high right frontal lobe. Along the anterior and lateral margins of the high-density clot is low-density that appears encapsulated, possibly unclotted blood/serum. Overall dimensions are 60 x 30 x 36 mm. There is a rim of surrounding edema and mild local subarachnoid extension. Midline shift measures up to 4 mm. No intraventricular extension or entrapment. Gliosis in the left occipital lobe is related to lobar hemorrhage in 2016. Small-vessel ischemic changes in the cerebral white matter. No noted generalized hemorrhagic changes in the cerebral hemispheres on 2016 MPGR, although  amyloid angiopathy is still possible. Hypertension or hemorrhagic infarct could also present with this appearance. Vascular: Atherosclerotic calcification. Skull: Normal. Negative for fracture or focal lesion. Sinuses/Orbits: Gaze to the right. Other: Critical Value/emergent results were called by telephone at the time of interpretation on 06/24/2016 at 2:36 pm to Dr. Ethelda Chick, who verbally acknowledged these results. IMPRESSION: 1. ~32 cc right frontal acute parenchymal hematoma. Mild local subarachnoid extension. 4 mm midline shift. 2. Gliosis from remote  left occipital lobar hemorrhage in 2016. Possible amyloid angiopathy, but no typical chronic hemorrhagic foci on 2016 brain MRI. Question hemorrhagic conversion of infarct. Electronically Signed   By: Marnee Spring M.D.   On: 06/24/2016 14:42    PHYSICAL EXAM  PHYSICAL EXAM Physical exam: Exam: Gen: NAD Eyes: anicteric sclerae, moist conjunctivae                    CV: no MRG, no carotid bruits, no peripheral edema Mental Status: Alert, follows commands, good historian  Neuro: Detailed Neurologic Exam  Mental status: Alert, oriented to name not month or year, follows some simple commands  Speech:    Fluent without aphasia or dysarthria  Cranial Nerves:    The pupils are equal, round, and reactive to light.. Attempted, Fundi not visualized.  Dysconjugate gaze with right esotropia (chronic per daughter). Right gaze preference. Left lower facial droop. Blinks to threat.  Tongue midline. Hearing intact to voice. Shoulder shrug intact  Motor Observation:    no involuntary movements noted. Tone appears normal.     Strength:   Left hemiparesis arm > leg     Sensation:  Intact to LT  Plantars downgoing.     ASSESSMENT/PLAN [name] is a 81 y.o. male with history of HTN, diabetes, CAD, dementia, atrial flutter not on AC, alcohol abuse, previous ICH presenting with confusion, "problems" getting around x 1 week. CT showed a R frontal ICH with midline shift. Noncompliant with medications, not taking his HTN medications.   Stroke:  right frontal ICH with 4mm midline shift in setting of hypertension, hemorrhage most likely hypertensive etiology due to noncompliance  Resultant HA  MRI  pending   Repeat CT this am if MRI not done in a timely manner  MRA  Not needed per MD  SCDs for VTE prophylaxis  Diet NPO time specified  aspirin 81 mg daily prior to admission, now on No antithrombotic given hemorrhage  Ongoing aggressive stroke risk factor management  Therapy  recommendations:  pending   Disposition:  pending   followed by Dr. Roda Shutters at [facility] P[facility]  Atrial Fibrillation  Home anticoagulation:  none   On coumadin until 2011 then off. Dr. [place] considered is Aflutter cured   Headache  Secondary to hemorrhage  Given low dose morphine for HA  Hypertensive Emergency  BP 192/101 in setting of neurologic symptoms, medication noncompliance  On coreg, catapres, hydralazine, HCTZ, lisinopril prior to admission - not taking per daughter was noncompliant  Now on nicardipine for BP control  SBP goal < 160  Has been less than 160 since 1745 last night Long-term BP goal normotensive  Hyperlipidemia  Home meds:  lipitor 10  Will not resume lipitor in hospital given hemorrhage  Continue statin at discharge  Diabetes type II  On glucophage at home  Gluc 105-111  Other Stroke Risk Factors  Advanced age  Obesity, Body mass index is 30.18 kg/m., recommend weight loss, diet and exercise as appropriate   Hx stroke/TIA  02/2105 L occipital  hemorhage with SAH, SDH and IVH. D/c to short term SNF. Resultant R hemianopia, significant language deficit and cognitive impairment  Coronary artery disease - MI  Unspecified sleep apnea  Noncompliant with medications and medical management  Other Active Problems  baseline Vascular dementia, on seroquel and remeron  BPH on flomax  Hospital day # 1   81 y.o. male with a flutter, history of stroke, diabetes, dementia,alcohol abuse, medical noncompliance currently on no anticoagulation secondary to previous intracranial hemorrhage and the arachnoid hemorrhage back in 2016. Patient presents to the hospital with confusion. Head CT was obtained and showed a acute hemorrhage in the right frontal lobe in the setting of elevated BP. Stroke likely due to hypertension given his recent medication non-compliance. Discussed with daughter at bedside. MRI pending.    Personally examined patient and images,  and have participated in and made any corrections needed to history, physical, neuro exam,assessment and plan as stated above.  I have personally obtained the history, evaluated lab date, reviewed imaging studies and agree with radiology interpretations.    Naomie DeanAntonia Gaspard Isbell, MD [facility]  A total of 35 minutes was spent in the care of this patient.        To contact Stroke Continuity provider, please refer to [email or web address]. After hours, contact General Neurology

## 2016-06-25 NOTE — Evaluation (Signed)
Clinical/Bedside Swallow Evaluation Patient Details  Name: Austin Sawyer MRN: [id number] Date of Birth: 1935/03/31  Today's Date: 06/25/2016 Time: SLP Start Time (ACUTE ONLY): 1446 SLP Stop Time (ACUTE ONLY): 1510 SLP Time Calculation (min) (ACUTE ONLY): 24 min  Past Medical Austin:  Past Medical Austin:  Diagnosis Date  . Alcohol Sawyer, Austin remission   . Atrial Sawyer (HCC)   . Back pain   . Austin Sawyer   . Coronary artery disease   . Coronary atherosclerosis of unspecified type of vessel, native or graft   . Austin Sawyer   . Depression   . Austin Sawyer mellitus without complication (HCC)   . Disturbance of skin sensation   . Hypertension   . MI, old   . Reflux   . Sawyer (HCC)   . Unspecified hearing loss   . Unspecified sleep apnea    Past Surgical Austin:  Past Surgical Austin:  Procedure Laterality Date  . HERNIA REPAIR    . ROTATOR CUFF REPAIR     HPI:  Austin Sawyer,Austin Sawyer, Austin Sawyer, Austin Sawyer, Austin Sawyer, Austin Sawyer, Austin Sawyer, Austin Sawyer back Austin 2016. Patient presents to the hospital with confusion. Head CT was obtained and showed a acute Sawyer Austin the right frontal lobe. Per chart tere is a midline shift that measures up to 4 mm. BSE 02/16/15 following Sawyer recommended NPO. Pt able to initiate Dys 1, thin 2 days later and upgraded to Dys 2 during this admission.   Assessment / Plan / Recommendation Clinical Impression  Signs of airway compromise with thin present followed by straw and cup sips thin liquid mitigated with nectar thick. Prolonged mastication and potential for pocketing on left oral cavity. Pt confused, decreased attention needed redirection throughout. Recommend Dys 1, nectar thick liquids, crush pills, no straws and full supervision.     Aspiration Risk  Moderate aspiration risk    Diet Recommendation Dysphagia 1 (Puree);Nectar-thick liquid   Liquid Administration  via: Cup;No straw Medication Administration: Crushed with puree Supervision: Patient able to self feed;Full supervision/cueing for compensatory strategies Compensations: Slow rate;Small sips/bites Postural Changes: Seated upright at 90 degrees    Other  Recommendations Oral Care Recommendations: Oral care BID   Follow up Recommendations  (TBD)      Frequency and Duration min 2x/week  2 weeks       Prognosis Prognosis for Safe Diet Advancement:  (fair-good) Barriers to Reach Goals: Cognitive deficits      Swallow Study   General HPI: Austin Sawyer an 81 y.o.malewith a Sawyer,Austin Sawyer, Austin Sawyer, Austin Sawyer, Austin Sawyer, Austin Sawyer, Austin Sawyer, Austin Sawyer back Austin 2016. Patient presents to the hospital with confusion. Head CT was obtained and showed a acute Sawyer Austin the right frontal lobe. Per chart tere is a midline shift that measures up to 4 mm. BSE 02/16/15 following Sawyer recommended NPO. Pt able to initiate Dys 1, thin 2 days later and upgraded to Dys 2 during this admission. Type of Study: Bedside Swallow Evaluation Previous Swallow Assessment: See HPI Diet Prior to this Study: NPO Temperature Spikes Noted: Yes Respiratory Status: Room air Austin of Recent Intubation: No Behavior/Cognition: Alert;Confused;Requires cueing Oral Cavity Assessment: Within Functional Limits Oral Care Completed by SLP: No Oral Cavity - Dentition: Dentures, top (natural lower) Vision:  (?) Self-Feeding Abilities: Needs assist;Needs set up Patient Positioning: Upright Austin bed Baseline Vocal Quality: Normal Volitional Cough: Weak Volitional Swallow: Able to elicit    Oral/Motor/Sensory Function Overall Oral  Motor/Sensory Function: Moderate impairment Facial ROM: Reduced left;Suspected CN VII (facial) dysfunction Facial Symmetry: Abnormal symmetry left;Suspected CN VII (facial) dysfunction Facial Strength: Reduced left;Suspected CN VII  (facial) dysfunction Lingual ROM: Within Functional Limits Lingual Symmetry: Within Functional Limits Velum: Within Functional Limits   Ice Chips Ice chips: Not tested   Thin Liquid Thin Liquid: Impaired Presentation: Straw;Cup Pharyngeal  Phase Impairments: Cough - Immediate    Nectar Thick Nectar Thick Liquid: Within functional limits Presentation: Cup   Honey Thick Honey Thick Liquid: Not tested   Puree Puree: Within functional limits   Solid   GO   Solid: Impaired Oral Phase Functional Implications: Prolonged oral transit        Royce MacadamiaLitaker, Kabir Brannock Willis 06/25/2016,4:04 PM Breck CoonsLisa Willis Lonell FaceLitaker M.Ed [employer]-SLP Pager [phone]

## 2016-06-25 NOTE — Progress Notes (Signed)
OT Cancellation Note  Patient Details Name: Austin Sawyer MRN: [id number] DOB: 06/28/1935   Cancelled Treatment:    Reason Eval/Treat Not Completed: Patient not medically ready. Active bedrest orders.  Gaye AlkenBailey A Montrae Braithwaite M.S., OTR/L Pager: [phone]  06/25/2016, 9:25 AM

## 2016-06-26 ENCOUNTER — Inpatient Hospital Stay (HOSPITAL_COMMUNITY): Payer: Medicare Other

## 2016-06-26 MED ORDER — CARVEDILOL 12.5 MG PO TABS
25.0000 mg | ORAL_TABLET | Freq: Two times a day (BID) | ORAL | Status: DC
  Administered 2016-06-26 – 2016-07-01 (×10): 25 mg via ORAL
  Filled 2016-06-26 (×10): qty 2

## 2016-06-26 NOTE — Progress Notes (Signed)
Speech Language Pathology Treatment: Dysphagia  Patient Details Name: Austin Sawyer MRN: [id number] DOB: 03/01/1935 Today's Date: 06/26/2016 Time: [id number] SLP Time Calculation (min) (ACUTE ONLY): 15 min  Assessment / Plan / Recommendation Clinical Impression  ST follow up for therapeutic diet tolerance.   Nursing reported no issues taking his medications this AM crushed in purees.  Charting indicated that the patient has had a few intermittent low grade temps.  Meal observation was completed and the patient was noted to have a very strong cough response given assisted self fed cup sips of nectar thick liquids with a corresponding drop in O2 levels.  Cough response was even noted given spoon sips of nectar thick liquids.  The patient was trialed on honey thick liquids as well as dysphagia 1 material without immediate s/s of aspiration.   During cognitive assessment the patient was once again noted to cough.  Recommend to cautiously continue a dysphagia 1 diet with HONEY thick liquids.  If the patient has any coughing with intake recommend that he be made NPO and that nursing page speech therapy.  ST will continue to follow for therapeutic diet tolerance and need for possible objective swallow study.      HPI HPI: Austin FusiLeon Bryantis an 81 y.o.malewith a flutter,history of stroke, cervicalgia, in remission from ETOH abuse, diabetes, dementia, intracranial hemorrhage, arachnoid hemorrhage back in 2016. Patient presents to the hospital with confusion. Head CT was obtained and showed a acute hemorrhage in the right frontal lobe. Per chart there is a midline shift that measures up to 4 mm. BSE 02/16/15 following stroke recommended NPO. Pt able to initiate Dys 1, thin 2 days later and upgraded to Dys 2 during that admission.          Recommendations  Diet recommendations: Dysphagia 1 (puree);Honey-thick liquid Liquids provided via: Cup;No straw Medication Administration: Crushed with puree Supervision:  Trained caregiver to feed patient Compensations: Slow rate;Small sips/bites;Other (Comment) (Feed slowly.) Postural Changes and/or Swallow Maneuvers: Seated upright 90 degrees;Upright 30-60 min after meal                Oral Care Recommendations: Oral care BID Follow up Recommendations: Other (comment) (Continued ST at next level of care.)       GO               Dimas AguasMelissa Hansini Clodfelter, MA, CCC-SLP Acute Rehab SLP [phone] Fleet ContrasGoodman, Pocahontas Cohenour N 06/26/2016, 12:38 PM

## 2016-06-26 NOTE — Progress Notes (Signed)
STROKE TEAM PROGRESS NOTE   HISTORY OF PRESENT ILLNESS (per record) Austin Sawyer is an 81 y.o. male with aflutter, history of stroke, diabetes, dementia, currently on no anticoagulation secondary to previous intracranial hemorrhage and subarachnoid hemorrhage back in 2016. Patient presents to the hospital with confusion. Head CT was obtained and showed a acute hemorrhage in the right frontal lobe. Overall dimensions are 60 x 30 x 36 mm. There is a midline shift that measures up to 4 mm. There is no intraventricular extension.  He was admitted to the neuro ICU for further evaluation and treatment.   SUBJECTIVE (INTERVAL HISTORY) Patient is alert but eyes closed , slightly restless, saying that he is hungry   OBJECTIVE Temp:  [97.8 F (36.6 C)-99.5 F (37.5 C)] 98 F (36.7 C) (09/23 0400) Pulse Rate:  [65-98] 65 (09/23 0700) Cardiac Rhythm: Normal sinus rhythm (09/22 2000) Resp:  [19-26] 19 (09/23 0700) BP: (101-183)/(59-161) 145/69 (09/23 0700) SpO2:  [84 %-99 %] 96 % (09/23 0700)  CBC:   Recent Labs Lab 06/24/16 1352 06/24/16 1400  WBC 13.1*  --   NEUTROABS 10.6*  --   HGB 13.9 15.3  HCT 42.4 45.0  MCV 90.2  --   PLT 202  --     Basic Metabolic Panel:   Recent Labs Lab 06/24/16 1352 06/24/16 1400  NA 137 139  K 4.2 4.2  CL 102 101  CO2 25  --   GLUCOSE 111* 105*  BUN 17 20  CREATININE 1.12 1.20  CALCIUM 9.8  --     Lipid Panel:     Component Value Date/Time   CHOL 178 02/09/2015 0745   TRIG 121 02/12/2015 1141   HDL 62 02/09/2015 0745   CHOLHDL 2.9 02/09/2015 0745   VLDL 9 02/09/2015 0745   LDLCALC 107 (H) 02/09/2015 0745   HgbA1c:  Lab Results  Component Value Date   HGBA1C 6.0 (H) 02/09/2015   Urine Drug Screen:     Component Value Date/Time   LABOPIA NONE DETECTED 02/08/2015 1823   COCAINSCRNUR NONE DETECTED 02/08/2015 1823   LABBENZ NONE DETECTED 02/08/2015 1823   AMPHETMU NONE DETECTED 02/08/2015 1823   THCU NONE DETECTED 02/08/2015 1823    LABBARB NONE DETECTED 02/08/2015 1823      IMAGING  Ct Head Wo Contrast 06/24/2016 1. ~32 cc right frontal acute parenchymal hematoma. Mild local subarachnoid extension. 4 mm midline shift.  2. Gliosis from remote left occipital lobar hemorrhage in 2016. Possible amyloid angiopathy, but no typical chronic hemorrhagic foci on 2016 brain MRI. Question hemorrhagic conversion of infarct.     Mr Brain Wo Contrast  06/26/2016 Moderate to severely motion degraded examination. Acute RIGHT frontal lobe 2.8 x 5.9 x 3.7 cm hematoma, resulting in mild RIGHT to LEFT midline shift. Old LEFT occipital lobe hemorrhagic infarct.     PHYSICAL EXAM  Physical exam: Exam: Gen: NAD Eyes: anicteric sclerae, moist conjunctivae                    CV: no MRG, no carotid bruits, no peripheral edema Mental Status: Alert, follows commands, good historian  Neuro:  Mental status: Alert, oriented to name not month or year, follows some simple commands  Speech:    Fluent without aphasia or dysarthria  Cranial Nerves:    The pupils are equal, round, and reactive to light. Dysconjugate gaze with right esotropia (chronic per daughter). Right gaze preference. Left lower facial droop. Blinks to threat.  Tongue midline. Hearing intact  to voice. Shoulder shrug intact  Motor Observation:    no involuntary movements noted. Tone appears normal.     Strength:   Left hemiparesis arm > leg     Sensation:  Intact to LT  Plantars downgoing.     ASSESSMENT/PLAN Austin Sawyer is a 81 y.o. male with history of HTN, diabetes, CAD, dementia, atrial flutter not on AC, alcohol abuse, previous ICH presenting with confusion, "problems" getting around x 1 week. CT showed a R frontal ICH with midline shift. Noncompliant with medications, not taking his HTN medications.   Stroke:  right frontal ICH with 4mm midline shift in setting of hypertension, hemorrhage most likely hypertensive etiology due to  noncompliance  Resultant HA  MRI - Acute RIGHT frontal lobe 2.8 x 5.9 x 3.7 cm hematoma,  SCDs for VTE prophylaxis DIET - DYS 1 Room service appropriate? Yes; Fluid consistency: Nectar Thick  aspirin 81 mg daily prior to admission, now on No antithrombotic given hemorrhage  Ongoing aggressive stroke risk factor management  Therapy recommendations: Skilled nursing facility placement recommended - social worker following.  Disposition:  Pending  Keppra 500mg  bid for sz prevention    Atrial Fibrillation  Home anticoagulation:  none   On coumadin until 2011 then off. Dr. [place] LionsHochrien considered is Aflutter cured   Headache  Secondary to hemorrhage  Given low dose morphine for HA as needed  Hypertensive Emergency  BP 192/101 in setting of neurologic symptoms, medication noncompliance  On coreg, catapres, hydralazine, HCTZ, lisinopril prior to admission - not taking per daughter was noncompliant  Now on nicardipine for BP control  Will add coreg 25mg  bid today, consider add other BP meds tomorrow  SBP goal < 160 Long-term BP goal normotensive  Hyperlipidemia  Home meds:  lipitor 10  Will not resume lipitor in hospital given hemorrhage  Continue statin at discharge  Diabetes type II  On glucophage at home  HbA1c 6.0  Other Stroke Risk Factors  Advanced age  Obesity, Body mass index is 30.18 kg/m., recommend weight loss, diet and exercise as appropriate   Hx stroke/TIA  02/2105 L occipital hemorhage with SAH, SDH and IVH. D/c to short term SNF. Resultant R hemianopia, significant language deficit and cognitive impairment  Coronary artery disease - MI  Unspecified sleep apnea  Noncompliant with medications and medical management  Other Active Problems  baseline Vascular dementia, on seroquel and remeron  BPH on flomax       .        To contact Stroke Continuity provider, please refer to [email or web address]. After hours, contact General  Neurology

## 2016-06-26 NOTE — Evaluation (Signed)
Speech Language Pathology Evaluation Patient Details Name: Austin Sawyer MRN: [id number] DOB: 09/26/1935 Today's Date: 06/26/2016 Time: 1191-4782 SLP Time Calculation (min) (ACUTE ONLY): 20 min  Problem List:  Patient Active Problem List   Diagnosis Date Noted  . Vascular dementia 11/24/2015  . Atrial flutter, unspecified 11/24/2015  . Cognitive impairment 08/19/2015  . Type 2 diabetes mellitus with other circulatory complications (HCC) 05/14/2015  . Chronic systolic congestive heart failure (HCC) 05/14/2015  . ICH (intracerebral hemorrhage) (HCC)   . HLD (hyperlipidemia)   . Agitation   . Accelerated hypertension   . Encephalopathy acute 02/17/2015  . SVT (supraventricular tachycardia) (HCC) 02/16/2015  . Cytotoxic brain edema (HCC)   . Encounter for feeding tube placement   . CHF (congestive heart failure) (HCC)   . Atelectasis   . Acute respiratory failure with hypoxemia (HCC) 02/10/2015  . Hemorrhage of brain, nontraumatic (HCC) 02/08/2015  . Stroke due to intracerebral hemorrhage (HCC) 02/08/2015  . Cervical disc disorder with radiculopathy of cervical region 09/14/2013  . Neck pain on left side 07/12/2013  . Paresthesia of left arm 07/12/2013  . Major depressive disorder, recurrent episode, severe (HCC) 04/30/2013  . Alcohol dependence in remission (HCC) 04/22/2013  . Hard of hearing 04/22/2013  . OBESITY, UNSPECIFIED 12/09/2009  . CARDIOMYOPATHY, SECONDARY 09/03/2009  . ATRIAL FLUTTER 09/03/2009  . Gout 03/12/2008  . HYPERGLYCEMIA 02/14/2008  . ANGINA PECTORIS 10/03/2007  . BARRETTS ESOPHAGUS 10/03/2007  . ARTHRITIS 10/03/2007  . WEIGHT LOSS 04/06/2007  . ANXIETY DEPRESSION 03/14/2007  . ABUSE, ALCOHOL, IN REMISSION 03/08/2007  . CORONARY ARTERY DISEASE 03/08/2007  . LACUNAR INFARCTION 03/08/2007  . COLONOSCOPY, HX OF 03/08/2007  . HYPOGONADISM 01/25/2007  . Hyperlipidemia 01/25/2007  . Essential hypertension 01/25/2007  . CONGESTIVE HEART FAILURE 01/25/2007  .  GERD 01/25/2007  . SLEEP APNEA 01/25/2007   Past Medical History:  Past Medical History:  Diagnosis Date  . Alcohol abuse, in remission   . Atrial flutter (HCC)   . Back pain   . Cervicalgia   . Coronary artery disease   . Coronary atherosclerosis of unspecified type of vessel, native or graft   . Dementia   . Depression   . Diabetes mellitus without complication (HCC)   . Disturbance of skin sensation   . Hypertension   . MI, old   . Reflux   . Stroke (HCC)   . Unspecified hearing loss   . Unspecified sleep apnea    Past Surgical History:  Past Surgical History:  Procedure Laterality Date  . HERNIA REPAIR    . ROTATOR CUFF REPAIR     HPI:  Austin Sawyer an 81 y.o.malewith a flutter,history of stroke, cervicalgia, in remission from ETOH abuse, diabetes, dementia, intracranial hemorrhage, arachnoid hemorrhage back in 2016. Patient presents to the hospital with confusion. Head CT was obtained and showed a acute hemorrhage in the right frontal lobe. Per chart there is a midline shift that measures up to 4 mm. BSE 02/16/15 following stroke recommended NPO. Pt able to initiate Dys 1, thin 2 days later and upgraded to Dys 2 during that admission.   Assessment / Plan / Recommendation Clinical Impression  Cognitive/linguistic and motor speech evaluation was completed.  The Mini Mental was attempted and the patient was not able to complete even the orientation task.  He is oriented to person only and disoriented to time, place and situation.  He demonstrated a significant right sided gaze preference.  Even given max multi modal cues he was not able to  avert his eyes or head to even close to midline.  when attempting to identify his daughter she had to get very close to him on the right side for her to be in his field of view and recognize her.  He was able to repeat 2/3 of novel words immediately and 0/3 with a delay.  He was able to follow simple 1 step directions but was not able to  follow 2 step commands.  He consistently would perform the first of the two directions.  He was unable to read and was noted to be perserverative.  Speech intelligibility was decreased and the patient's speech did not always make sense.  Currently the patient would require 24x7 supervision at DC.  He will require intensive ST to address deficits at the next level of care.  ST will follow at this level.      SLP Assessment  Patient needs continued Speech Lanaguage Pathology Services    Follow Up Recommendations  Rehab Consult   Frequency and Duration min 2x/week  2 weeks      SLP Evaluation Cognition  Overall Cognitive Status: Impaired/Different from baseline Arousal/Alertness: Awake/alert Orientation Level: Oriented to person;Disoriented to place;Disoriented to time;Disoriented to situation Attention: Focused Focused Attention: Impaired Memory: Impaired Memory Impairment: Storage deficit;Retrieval deficit;Decreased recall of new information;Decreased short term memory Decreased Short Term Memory: Verbal basic Awareness: Impaired Awareness Impairment: Intellectual impairment Problem Solving: Impaired Problem Solving Impairment: Verbal basic Safety/Judgment: Impaired       Comprehension  Auditory Comprehension Overall Auditory Comprehension: Impaired Yes/No Questions: Within Functional Limits Commands: Impaired One Step Basic Commands: 75-100% accurate Two Step Basic Commands: 0-24% accurate Conversation: Simple Interfering Components: Attention Visual Recognition/Discrimination Discrimination: Not tested Reading Comprehension Reading Status: Impaired Word level: Not tested Sentence Level: Impaired Paragraph Level: Not tested    Expression Expression Primary Mode of Expression: Verbal Written Expression Written Expression: Not tested   Oral / Motor  Motor Speech Respiration: Within functional limits Phonation: Normal Resonance: Within functional  limits Articulation: Impaired Level of Impairment: Sentence Intelligibility: Intelligibility reduced Word: 75-100% accurate Phrase: 50-74% accurate Sentence: 50-74% accurate Conversation: 50-74% accurate Motor Planning: Not tested   GO                   Dimas AguasMelissa Enma Maeda, MA, CCC-SLP Acute Rehab SLP [phone] Dimas AguasGoodman, Rayland Hamed N 06/26/2016, 1:00 PM

## 2016-06-27 MED ORDER — HYDRALAZINE HCL 20 MG/ML IJ SOLN
10.0000 mg | INTRAMUSCULAR | Status: DC | PRN
  Administered 2016-06-27 – 2016-07-02 (×7): 10 mg via INTRAVENOUS
  Filled 2016-06-27 (×9): qty 1

## 2016-06-27 MED ORDER — FAMOTIDINE 20 MG PO TABS: 20.0000 mg | ORAL_TABLET | Freq: Two times a day (BID) | ORAL | Status: DC

## 2016-06-27 MED ORDER — SODIUM CHLORIDE 0.9 % IV SOLN: Freq: Once | INTRAVENOUS | Status: DC

## 2016-06-27 MED ORDER — FAMOTIDINE IN NACL 20-0.9 MG/50ML-% IV SOLN
20.0000 mg | Freq: Two times a day (BID) | INTRAVENOUS | Status: DC
  Administered 2016-06-27 – 2016-06-28 (×3): 20 mg via INTRAVENOUS
  Filled 2016-06-27 (×2): qty 50

## 2016-06-27 MED ORDER — HYDRALAZINE HCL 25 MG PO TABS: 25.0000 mg | ORAL_TABLET | Freq: Three times a day (TID) | ORAL | Status: DC

## 2016-06-27 NOTE — Progress Notes (Signed)
STROKE TEAM PROGRESS NOTE   HISTORY OF PRESENT ILLNESS (per record) Austin Sawyer is an 81 y.o. male with aflutter, history of stroke, diabetes, dementia, currently on no anticoagulation secondary to previous intracranial hemorrhage and subarachnoid hemorrhage back in 2016. Patient presents to the hospital with confusion. Head CT was obtained and showed a acute hemorrhage in the right frontal lobe. Overall dimensions are 60 x 30 x 36 mm. There is a midline shift that measures up to 4 mm. There is no intraventricular extension.  He was admitted to the neuro ICU for further evaluation and treatment.   SUBJECTIVE (INTERVAL HISTORY) Patient is alert but eyes closed , failed bedside swallowing eval, confused, speech is slurred, very poor vision, forced eye deviation to right  OBJECTIVE Temp:  [98.1 F (36.7 C)-99.6 F (37.6 C)] 98.1 F (36.7 C) (09/24 0742) Pulse Rate:  [61-86] 74 (09/24 0700) Cardiac Rhythm: Normal sinus rhythm (09/23 2000) Resp:  [0-38] 38 (09/24 0700) BP: (102-178)/(53-115) 153/79 (09/24 0700) SpO2:  [96 %-100 %] 97 % (09/24 0700)  CBC:   Recent Labs Lab 06/24/16 1352 06/24/16 1400  WBC 13.1*  --   NEUTROABS 10.6*  --   HGB 13.9 15.3  HCT 42.4 45.0  MCV 90.2  --   PLT 202  --     Basic Metabolic Panel:   Recent Labs Lab 06/24/16 1352 06/24/16 1400  NA 137 139  K 4.2 4.2  CL 102 101  CO2 25  --   GLUCOSE 111* 105*  BUN 17 20  CREATININE 1.12 1.20  CALCIUM 9.8  --     Lipid Panel:     Component Value Date/Time   CHOL 178 02/09/2015 0745   TRIG 121 02/12/2015 1141   HDL 62 02/09/2015 0745   CHOLHDL 2.9 02/09/2015 0745   VLDL 9 02/09/2015 0745   LDLCALC 107 (H) 02/09/2015 0745   HgbA1c:  Lab Results  Component Value Date   HGBA1C 6.0 (H) 02/09/2015   Urine Drug Screen:     Component Value Date/Time   LABOPIA NONE DETECTED 02/08/2015 1823   COCAINSCRNUR NONE DETECTED 02/08/2015 1823   LABBENZ NONE DETECTED 02/08/2015 1823   AMPHETMU NONE  DETECTED 02/08/2015 1823   THCU NONE DETECTED 02/08/2015 1823   LABBARB NONE DETECTED 02/08/2015 1823      IMAGING  Ct Head Wo Contrast 06/24/2016 1. ~32 cc right frontal acute parenchymal hematoma. Mild local subarachnoid extension. 4 mm midline shift.  2. Gliosis from remote left occipital lobar hemorrhage in 2016. Possible amyloid angiopathy, but no typical chronic hemorrhagic foci on 2016 brain MRI. Question hemorrhagic conversion of infarct.     Mr Brain Wo Contrast  06/26/2016 Moderate to severely motion degraded examination. Acute RIGHT frontal lobe 2.8 x 5.9 x 3.7 cm hematoma, resulting in mild RIGHT to LEFT midline shift. Old LEFT occipital lobe hemorrhagic infarct.   EEG 06/25/2016 IMPRESSION: This is an abnormal EEG demonstrating a mild diffuse slowing of electrocerebral activity.  This can be seen in a wide variety of encephalopathic state including those of a toxic, metabolic, or degenerative nature.  There were no focal, hemispheric, or lateralizing features.  No epileptiform activity was recorded.     PHYSICAL EXAM  Physical exam: Exam: Gen: NAD Eyes: anicteric sclerae, moist conjunctivae                    CV: no MRG, no carotid bruits, no peripheral edema Mental Status: Alert, follows commands, good historian  Neuro:  Mental status: Alert, but not oriented, follows some simple commands  Speech:    Fluent but slurred  Cranial Nerves:    The pupils are equal, round, and reactive to light. Forced right sided eyes deviation. Left lower facial droop. Blinks to threat. Poor vision  Tongue midline. Shoulder shrug intact  Motor Observation:    no involuntary movements noted. Tone appears normal.     Strength:   Left hemiparesis arm > leg Moves right side appropriately     Sensation:  Intact to LT  Plantars downgoing.     ASSESSMENT/PLAN [name] is a 81 y.o. male with history of HTN, diabetes, CAD, dementia, atrial flutter not on AC, alcohol  abuse, previous ICH presenting with confusion, "problems" getting around x 1 week. CT showed a R frontal ICH with midline shift. Noncompliant with medications, not taking his HTN medications.   Stroke:  right frontal ICH with 4mm midline shift in setting of hypertension, hemorrhage most likely hypertensive etiology due to noncompliance  Resultant HA  MRI - Acute RIGHT frontal lobe 2.8 x 5.9 x 3.7 cm hematoma,  SCDs for VTE prophylaxis  aspirin 81 mg daily prior to admission, now on No antithrombotic given hemorrhage  Ongoing aggressive stroke risk factor management  Therapy recommendations: Skilled nursing facility placement recommended - social worker following.  Disposition:  Pending  Keppra 500mg  bid for sz prevention    Atrial Fibrillation  Home anticoagulation:  none   On coumadin until 2011 then off. Dr. [place] Lions considered is Aflutter cured  Rate controlled   Headache  Secondary to hemorrhage  Given low dose morphine for HA as needed   Hypertension  On coreg, catapres, hydralazine, HCTZ, lisinopril prior to admission - not taking per daughter was noncompliant  Now on nicardipine for BP control  Once swallowing improves will start his home meds  SBP goal < 160 Long-term BP goal normotensive  Hyperlipidemia   Continue statin at discharge  Diabetes type II  On glucophage at home  HbA1c 6.0  Other Stroke Risk Factors  Advanced age  Obesity, Body mass index is 30.18 kg/m., recommend weight loss, diet and exercise as appropriate   Hx stroke/TIA  02/2105 L occipital hemorhage with SAH, SDH and IVH. D/c to short term SNF. Resultant R hemianopia, significant language deficit and cognitive impairment  Coronary artery disease - MI  Unspecified sleep apnea  Noncompliant with medications and medical management  Atrial fibrillation without anticoagulation    Other Active Problems  baseline Vascular dementia, on seroquel and remeron  BPH on  flomax  Leukocytosis - 13.1  Keppra started at time of admission as seizure prevention. (EEG 06/25/2016 abnormal as above)   PLAN  Gradually resume home blood pressure medications once swallowing improves  Check labs in a.m.  Hold PO meds for now. Pt failed bedside swallow with speech. Barium Swallow Monday.       .        To contact Stroke Continuity provider, please refer to [email or web address]. After hours, contact General Neurology

## 2016-06-27 NOTE — [place] FL2 (Signed)
[place] MEDICAID FL2 LEVEL OF CARE SCREENING TOOL     IDENTIFICATION  Patient Name: Austin Sawyer Birthdate: 03/28/1935 Sex: male Admission Date (Current Location): 06/24/2016  [facility] [place]   Facility and Address:  [facility], [address], [place], [place] [address]      Provider Number: [id number]  Attending Physician Name and Address:  Marvel PlanJindong Xu, MD  Relative Name and Phone Number:       Current Level of Care: Hospital Recommended Level of Care: Skilled Nursing Facility Prior Approval Number:    Date Approved/Denied:   PASRR Number:    Discharge Plan: SNF    Current Diagnoses: Patient Active Problem List   Diagnosis Date Noted  . Vascular dementia 11/24/2015  . Atrial flutter, unspecified 11/24/2015  . Cognitive impairment 08/19/2015  . Type 2 diabetes mellitus with other circulatory complications (HCC) 05/14/2015  . Chronic systolic congestive heart failure (HCC) 05/14/2015  . ICH (intracerebral hemorrhage) (HCC)   . HLD (hyperlipidemia)   . Agitation   . Accelerated hypertension   . Encephalopathy acute 02/17/2015  . SVT (supraventricular tachycardia) (HCC) 02/16/2015  . Cytotoxic brain edema (HCC)   . Encounter for feeding tube placement   . CHF (congestive heart failure) (HCC)   . Atelectasis   . Acute respiratory failure with hypoxemia (HCC) 02/10/2015  . Hemorrhage of brain, nontraumatic (HCC) 02/08/2015  . Stroke due to intracerebral hemorrhage (HCC) 02/08/2015  . Cervical disc disorder with radiculopathy of cervical region 09/14/2013  . Neck pain on left side 07/12/2013  . Paresthesia of left arm 07/12/2013  . Major depressive disorder, recurrent episode, severe (HCC) 04/30/2013  . Alcohol dependence in remission (HCC) 04/22/2013  . Hard of hearing 04/22/2013  . OBESITY, UNSPECIFIED 12/09/2009  . CARDIOMYOPATHY, SECONDARY 09/03/2009  . ATRIAL FLUTTER 09/03/2009  . Gout 03/12/2008  .  HYPERGLYCEMIA 02/14/2008  . ANGINA PECTORIS 10/03/2007  . BARRETTS ESOPHAGUS 10/03/2007  . ARTHRITIS 10/03/2007  . WEIGHT LOSS 04/06/2007  . ANXIETY DEPRESSION 03/14/2007  . ABUSE, ALCOHOL, IN REMISSION 03/08/2007  . CORONARY ARTERY DISEASE 03/08/2007  . LACUNAR INFARCTION 03/08/2007  . COLONOSCOPY, HX OF 03/08/2007  . HYPOGONADISM 01/25/2007  . Hyperlipidemia 01/25/2007  . Essential hypertension 01/25/2007  . CONGESTIVE HEART FAILURE 01/25/2007  . GERD 01/25/2007  . SLEEP APNEA 01/25/2007    Orientation RESPIRATION BLADDER Height & Weight     Self  O2 (2 L/min) Incontinent Weight: 204 lb 5.9 oz (92.7 kg) Height:  5\' 9"  (175.3 cm)  BEHAVIORAL SYMPTOMS/MOOD NEUROLOGICAL BOWEL NUTRITION STATUS      Incontinent Diet (Currently NPO)  AMBULATORY STATUS COMMUNICATION OF NEEDS Skin   Extensive Assist Verbally Skin abrasions (Right hip)                       Personal Care Assistance Level of Assistance  Total care       Total Care Assistance: Maximum assistance   Functional Limitations Info  Speech (slurred speech)     Speech Info: Impaired    SPECIAL CARE FACTORS FREQUENCY  PT (By licensed PT), OT (By licensed OT)     PT Frequency: 5 OT Frequency: 5            Contractures Contractures Info: Not present    Additional Factors Info  Code Status, Allergies Code Status Info: Full Code Allergies Info: NKA           Current Medications (06/27/2016):  This is the  current hospital active medication list Current Facility-Administered Medications  Medication Dose Route Frequency Provider Last Rate Last Dose  . 0.9 %  sodium chloride infusion   Intravenous Continuous Ulice Dash, PA-C 75 mL/hr at 06/27/16 1900    . acetaminophen (TYLENOL) tablet 650 mg  650 mg Oral Q4H PRN Ulice Dash, PA-C       Or  . acetaminophen (TYLENOL) suppository 650 mg  650 mg Rectal Q4H PRN Ulice Dash, PA-C      . carvedilol (COREG) tablet 25 mg  25 mg Oral BID WC Zulfiqar Willodean Rosenthal, MD   25 mg at 06/27/16 0945  . famotidine (PEPCID) IVPB 20 mg premix  20 mg Intravenous Q12H Rachel L Rumbarger, RPH   20 mg at 06/27/16 1130  . hydrALAZINE (APRESOLINE) tablet 25 mg  25 mg Oral Q8H David L Rinehuls, PA-C   Stopped at 06/27/16 1343  . levETIRAcetam (KEPPRA) 500 mg in sodium chloride 0.9 % 100 mL IVPB  500 mg Intravenous Q12H Ulice Dash, PA-C 420 mL/hr at 06/27/16 1830 500 mg at 06/27/16 1830  . morphine 2 MG/ML injection 2 mg  2 mg Intravenous Q4H PRN Anson Fret, MD      . nicardipine (CARDENE) 20mg  in 0.86% saline IV infusion (0.1 mg/ml)  0-15 mg/hr Intravenous Continuous Ulice Dash, PA-C   Stopped at 06/27/16 1600  . RESOURCE THICKENUP CLEAR   Oral PRN Anson Fret, MD      . senna-docusate (Senokot-S) tablet 1 tablet  1 tablet Oral BID Ulice Dash, PA-C   1 tablet at 06/27/16 4098     Discharge Medications: Please see discharge summary for a list of discharge medications.  Relevant Imaging Results:  Relevant Lab Results:   Additional Information SSN: [id number] [address] [place]

## 2016-06-27 NOTE — Progress Notes (Signed)
Speech Language Pathology Treatment: Dysphagia  Patient Details Name: Candise CheLeon Casalino MRN: [id number] DOB: 03/16/1935 Today's Date: 06/27/2016 Time: 8413-24400949-1001 SLP Time Calculation (min) (ACUTE ONLY): 12 min  Assessment / Plan / Recommendation Clinical Impression  RN requesting SLP visit due to coughing with pos. Downgraded from nectar thick to honey thick liquid 9/23 at bedside due to coughing with nectar thick liquids. Patient alert when spoken to but with very poor sustained attention, significant deviation of eyes to the right with blink to threat on right only. Patient with significant oral holding without attempts to move bolus posteriorly for initiation of swallow despite max verbal, visual, and tactile clinician cueing. Eventual oral suctioning provided removing 100% of bolus from oral cavity. Aspiration risk high at this time with pos. Educated wife who was present for treatment. Will f/u at bedside 9/25 for diagnostic treatment. Unclear cause of decline. RN informed of performance today.    HPI HPI: [name] an 81 y.o.malewith a flutter,history of stroke, cervicalgia, in remission from ETOH abuse, diabetes, dementia, intracranial hemorrhage, arachnoid hemorrhage back in 2016. Patient presents to the hospital with confusion. Head CT was obtained and showed a acute hemorrhage in the right frontal lobe. Per chart there is a midline shift that measures up to 4 mm. BSE 02/16/15 following stroke recommended NPO. Pt able to initiate Dys 1, thin 2 days later and upgraded to Dys 2 during that admission.      SLP Plan  Goals updated     Recommendations  Diet recommendations: NPO Medication Administration: Via alternative means                Oral Care Recommendations: Oral care QID Plan: Goals updated       GO          Ferdinand LangoLeah Aashka Salomone MA, CCC-SLP [phone]       Rhiannon Sassaman Meryl 06/27/2016, 10:38 AM

## 2016-06-28 ENCOUNTER — Inpatient Hospital Stay (HOSPITAL_COMMUNITY): Payer: Medicare Other

## 2016-06-28 DIAGNOSIS — Z9114 Patient's other noncompliance with medication regimen: Secondary | ICD-10-CM

## 2016-06-28 DIAGNOSIS — I610 Nontraumatic intracerebral hemorrhage in hemisphere, subcortical: Secondary | ICD-10-CM

## 2016-06-28 DIAGNOSIS — E785 Hyperlipidemia, unspecified: Secondary | ICD-10-CM

## 2016-06-28 DIAGNOSIS — I161 Hypertensive emergency: Secondary | ICD-10-CM

## 2016-06-28 DIAGNOSIS — F0150 Vascular dementia without behavioral disturbance: Secondary | ICD-10-CM

## 2016-06-28 LAB — CBC
HCT: 41.5 % (ref 39.0–52.0)
HEMOGLOBIN: 13.8 g/dL (ref 13.0–17.0)
MCH: 29.4 pg (ref 26.0–34.0)
MCHC: 33.3 g/dL (ref 30.0–36.0)
MCV: 88.3 fL (ref 78.0–100.0)
PLATELETS: 200 10*3/uL (ref 150–400)
RBC: 4.7 MIL/uL (ref 4.22–5.81)
RDW: 13.9 % (ref 11.5–15.5)
WBC: 13 10*3/uL — ABNORMAL HIGH (ref 4.0–10.5)

## 2016-06-28 LAB — LIPID PANEL
Cholesterol: 216 mg/dL — ABNORMAL HIGH (ref 0–200)
HDL: 35 mg/dL — ABNORMAL LOW (ref >40)
LDL Cholesterol: 158 mg/dL — ABNORMAL HIGH (ref 0–99)
Total CHOL/HDL Ratio: 6.2 RATIO
Triglycerides: 114 mg/dL (ref <150)
VLDL: 23 mg/dL (ref 0–40)

## 2016-06-28 LAB — GLUCOSE, CAPILLARY
GLUCOSE-CAPILLARY: 112 mg/dL — AB (ref 65–99)
Glucose-Capillary: 115 mg/dL — ABNORMAL HIGH (ref 65–99)

## 2016-06-28 MED ORDER — VITAL HIGH PROTEIN PO LIQD
1000.0000 mL | ORAL | Status: DC
  Administered 2016-06-28: 1000 mL

## 2016-06-28 MED ORDER — GABAPENTIN 300 MG PO CAPS
300.0000 mg | ORAL_CAPSULE | Freq: Every day | ORAL | Status: DC
  Administered 2016-06-28: 300 mg via ORAL
  Filled 2016-06-28 (×2): qty 1

## 2016-06-28 MED ORDER — DULOXETINE HCL 20 MG PO CPEP
20.0000 mg | ORAL_CAPSULE | Freq: Every day | ORAL | Status: DC
  Filled 2016-06-28 (×2): qty 1

## 2016-06-28 MED ORDER — PRO-STAT SUGAR FREE PO LIQD
30.0000 mL | Freq: Two times a day (BID) | ORAL | Status: DC
  Administered 2016-06-28 – 2016-07-06 (×15): 30 mL
  Filled 2016-06-28 (×15): qty 30

## 2016-06-28 MED ORDER — HEPARIN SODIUM (PORCINE) 5000 UNIT/ML IJ SOLN
5000.0000 [IU] | Freq: Three times a day (TID) | INTRAMUSCULAR | Status: DC
  Administered 2016-06-28 – 2016-07-06 (×24): 5000 [IU] via SUBCUTANEOUS
  Filled 2016-06-28 (×25): qty 1

## 2016-06-28 MED ORDER — METFORMIN HCL 500 MG PO TABS
500.0000 mg | ORAL_TABLET | Freq: Every day | ORAL | Status: DC
Start: 2016-06-28 — End: 2016-07-07
  Administered 2016-06-28 – 2016-07-06 (×8): 500 mg via ORAL
  Filled 2016-06-28 (×8): qty 1

## 2016-06-28 MED ORDER — ATORVASTATIN CALCIUM 10 MG PO TABS
20.0000 mg | ORAL_TABLET | Freq: Every day | ORAL | Status: DC
  Administered 2016-06-28 – 2016-07-06 (×9): 20 mg via ORAL
  Filled 2016-06-28 (×2): qty 2
  Filled 2016-06-28: qty 1
  Filled 2016-06-28: qty 2
  Filled 2016-06-28 (×2): qty 1
  Filled 2016-06-28 (×3): qty 2

## 2016-06-28 MED ORDER — PANTOPRAZOLE SODIUM 40 MG PO PACK
40.0000 mg | PACK | Freq: Every day | ORAL | Status: DC
  Administered 2016-06-28 – 2016-07-06 (×8): 40 mg
  Filled 2016-06-28 (×8): qty 20

## 2016-06-28 MED ORDER — LISINOPRIL 20 MG PO TABS
20.0000 mg | ORAL_TABLET | Freq: Every day | ORAL | Status: DC
  Administered 2016-06-28 – 2016-07-01 (×4): 20 mg via ORAL
  Filled 2016-06-28 (×4): qty 1

## 2016-06-28 NOTE — Progress Notes (Signed)
STROKE TEAM PROGRESS NOTE   SUBJECTIVE (INTERVAL HISTORY) Patient daughter is at bedside. Pt is awake alert but confused, failed bedside swallowing eval, and put on tube feeding.   OBJECTIVE Temp:  [98.7 F (37.1 C)-99.2 F (37.3 C)] 99 F (37.2 C) (09/25 1549) Pulse Rate:  [62-80] 67 (09/25 1549) Cardiac Rhythm: Normal sinus rhythm (09/25 0800) Resp:  [14-23] 21 (09/25 1549) BP: (97-184)/(61-118) 141/103 (09/25 1200) SpO2:  [96 %-99 %] 97 % (09/25 1549)  CBC:   Recent Labs Lab 06/24/16 1352 06/24/16 1400 06/28/16 0532  WBC 13.1*  --  13.0*  NEUTROABS 10.6*  --   --   HGB 13.9 15.3 13.8  HCT 42.4 45.0 41.5  MCV 90.2  --  88.3  PLT 202  --  200    Basic Metabolic Panel:   Recent Labs Lab 06/24/16 1352 06/24/16 1400  NA 137 139  K 4.2 4.2  CL 102 101  CO2 25  --   GLUCOSE 111* 105*  BUN 17 20  CREATININE 1.12 1.20  CALCIUM 9.8  --     Lipid Panel:     Component Value Date/Time   CHOL 216 (H) 06/28/2016 0347   TRIG 114 06/28/2016 0347   HDL 35 (L) 06/28/2016 0347   CHOLHDL 6.2 06/28/2016 0347   VLDL 23 06/28/2016 0347   LDLCALC 158 (H) 06/28/2016 0347   HgbA1c:  Lab Results  Component Value Date   HGBA1C 6.0 (H) 02/09/2015   Urine Drug Screen:     Component Value Date/Time   LABOPIA NONE DETECTED 02/08/2015 1823   COCAINSCRNUR NONE DETECTED 02/08/2015 1823   LABBENZ NONE DETECTED 02/08/2015 1823   AMPHETMU NONE DETECTED 02/08/2015 1823   THCU NONE DETECTED 02/08/2015 1823   LABBARB NONE DETECTED 02/08/2015 1823      IMAGING I have personally reviewed the radiological images below and agree with the radiology interpretations.  Ct Head Wo Contrast 06/24/2016 1. ~32 cc right frontal acute parenchymal hematoma. Mild local subarachnoid extension. 4 mm midline shift.  2. Gliosis from remote left occipital lobar hemorrhage in 2016. Possible amyloid angiopathy, but no typical chronic hemorrhagic foci on 2016 brain MRI. Question hemorrhagic  conversion of infarct.   Mr Brain Wo Contrast  06/26/2016 Moderate to severely motion degraded examination. Acute RIGHT frontal lobe 2.8 x 5.9 x 3.7 cm hematoma, resulting in mild RIGHT to LEFT midline shift. Old LEFT occipital lobe hemorrhagic infarct.  EEG 06/25/2016 IMPRESSION: This is an abnormal EEG demonstrating a mild diffuse slowing of electrocerebral activity.  This can be seen in a wide variety of encephalopathic state including those of a toxic, metabolic, or degenerative nature.  There were no focal, hemispheric, or lateralizing features.  No epileptiform activity was recorded.  TTE pending   PHYSICAL EXAM  Temp:  [98.7 F (37.1 C)-99.2 F (37.3 C)] 99 F (37.2 C) (09/25 1549) Pulse Rate:  [62-80] 67 (09/25 1549) Resp:  [14-23] 21 (09/25 1549) BP: (97-184)/(61-118) 141/103 (09/25 1200) SpO2:  [96 %-99 %] 97 % (09/25 1549)  General - Well nourished, well developed, in no apparent distress.  Ophthalmologic - Fundi not visualized due to noncooperation.  Cardiovascular - Regular rate and rhythm.  Mental Status -  Level of arousal and orientation to self, but not orientated to place, time or people. Language exam showed paucity of speech, able to follow simple commands after several request, not able to name but able to repeat simple sentences.  Cranial Nerves II - XII - II -  Visual field exam showed left visual field neglect. III, IV, VI - right gaze preference. V - Facial sensation intact bilaterally. VII - left facial droop. VIII - Hearing & vestibular intact bilaterally. X - Palate elevates symmetrically. XI - Chin turning & shoulder shrug intact bilaterally. XII - Tongue protrusion to the left.  Motor Strength - The patient's strength was 5/5 RUE, mild withdraw at LUE with pain stimulation, RLE 4/5 and LLE 3/5.  Bulk was normal and fasciculations were absent.   Motor Tone - Muscle tone was assessed at the neck and appendages and was normal.  Reflexes - The  patient's reflexes were 1+ in all extremities and he had bilateral babinski reflexes.  Sensory - Light touch, temperature/pinprick were assessed and were symmetrical.    Coordination - no cooperative on test.  Tremor was absent.  Gait and Station - not tested due to safety concerns   ASSESSMENT/PLAN [name] is a 81 y.o. male with history of HTN, diabetes, CAD, dementia, cured atrial flutter not on [facility], alcohol abuse, previous lobar ICH presenting with confusion, "problems" getting around x 1 week. CT showed a R frontal ICH with midline shift. Noncompliant with medications, not taking his HTN medications.   Stroke:  right frontal ICH with 4mm midline shift in setting of hypertension, etiology could be due to hypertensive due to noncompliance or CAA given recurrent lobar ICHs  Resultant HA, baseline confusion  CT - right frontal ICH  MRI - Acute RIGHT frontal lobe 2.8 x 5.9 x 3.7 cm hematoma, stable size  EEG - mild general slowing, no seizure - d/c keppra  2D echo - pending  Heparin subq for VTE prophylaxis  aspirin 81 mg daily prior to admission, now on No antithrombotic given hemorrhage. Due to concern of CAA etiology, will not recommend antiplatelet in the future.  Ongoing aggressive stroke risk factor management  Therapy recommendations: SNF  Disposition:  Pending  History of lobar ICH  02/2015 left parietal occipital ICH/IVH/SAH/SDH  BP high and controlled with 4 BP meds at discharge  Not compliant with BP meds  Considered hypertensive ICH and put on ASA 81 later   Resultant significant language deficit and cognitive impairment  Atrial Fibrillation  Home anticoagulation:  none   On coumadin until 2011 then off. Dr. [place] considered is Aflutter cure  Hypertension  On coreg, catapres, hydralazine, HCTZ, lisinopril prior to admission - not taking per daughter was noncompliant  Off nicardipine for BP control  Currently BP stable  Resumed coreg and  lisinopril for BP control  SBP goal < 160 Long-term BP goal normotensive  Hyperlipidemia  LDL 158, goal < 70  Put on lipitor 20mg   Continue statin at discharge  Diabetes type II  On glucophage at home  HbA1c 6.0  Resume glucophage  Vascular dementia  Baseline dementia  On seroquel and remoron at home  Hold off psychoactive meds for now.  Other Stroke Risk Factors  Advanced age  Obesity, Body mass index is 30.18 kg/m., recommend weight loss, diet and exercise as appropriate   Hx stroke/TIA  02/2105 L occipital hemorhage with SAH, SDH and IVH. D/c to short term SNF. Resultant R hemianopia, significant language deficit and cognitive impairment  Coronary artery disease - MI  Unspecified sleep apnea  Noncompliant with medications and medical management  Atrial fibrillation without anticoagulation  This patient is critically ill due to recurrent lobar ICH, hypertensive emergency, dementia and at significant risk of neurological worsening, death form recurrent ICH, encephalopathy, seizure.  This patient's care requires constant monitoring of vital signs, hemodynamics, respiratory and cardiac monitoring, review of multiple databases, neurological assessment, discussion with family, other specialists and medical decision making of high complexity. I spent 35 minutes of neurocritical care time in the care of this patient.   Marvel PlanJindong Izeyah Deike, MD PhD Stroke Neurology 06/28/2016 4:22 PM         .        To contact Stroke Continuity provider, please refer to [email or web address]. After hours, contact General Neurology

## 2016-06-28 NOTE — Care Management Important Message (Signed)
Important Message  Patient Details  Name: Austin Sawyer MRN: [id number] Date of Birth: 07/19/1935   Medicare Important Message Given:  Other (see comment)    [name] 06/28/2016, 11:16 AM

## 2016-06-28 NOTE — Progress Notes (Signed)
Brief Nutrition Note  Consult received for enteral/tube feeding initiation and management.  Adult Enteral Nutrition Protocol initiated. Full assessment to follow.  Admitting Dx: Hemorrhagic stroke (HCC) [I61.9]  Body mass index is 30.18 kg/m. Pt meets criteria for obesity class I based on current BMI.  Labs:   Recent Labs Lab 06/24/16 1352 06/24/16 1400  NA 137 139  K 4.2 4.2  CL 102 101  CO2 25  --   BUN 17 20  CREATININE 1.12 1.20  CALCIUM 9.8  --   GLUCOSE 111* [address]*    Zailah Zagami RD, LDN, CNSC [phone] Pager [phone] After Hours Pager

## 2016-06-28 NOTE — Progress Notes (Signed)
Physical Therapy Treatment Patient Details Name: Austin CheLeon Austin Sawyer: [id number] DOB: 01/25/1935 Today's Date: 06/28/2016    History of Present Illness Austin Sawyer is an 81 y.o. male with a flutter, history of stroke, cervicalgia, in remission from ETOH abuse, diabetes, dementia, intracranial hemorrhage, arachnoid hemorrhage back in 2016. Patient presents to the hospital with confusion. Head CT was obtained and showed a acute hemorrhage in the right frontal lobe. Per chart tere is a midline shift that measures up to 4 mm    PT Comments    Patient seen for activity progression. Patient toelrated some EOB activities but remains easily distracted with max cues for direction to tasks. Patient tolerated OOB to chair with 2 person assist. Will continue to see and progress as tolerated.  Follow Up Recommendations  SNF     Equipment Recommendations   (defer to next venue)    Recommendations for Other Services       Precautions / Restrictions Precautions Precautions: Fall Restrictions Weight Bearing Restrictions: No    Mobility  Bed Mobility Overal bed mobility: Needs Assistance;+2 for physical assistance Bed Mobility: Supine to Sit     Supine to sit: Mod assist;+2 for physical assistance     General bed mobility comments: Moderate assist to come to EOB, patient able to initiate LE movement, max cues to direct to task  Transfers Overall transfer level: Needs assistance Equipment used: 2 person hand held assist Transfers: Sit to/from Stand;Stand Pivot Transfers Sit to Stand: Mod assist;+2 physical assistance Stand pivot transfers: Mod assist;+2 physical assistance       General transfer comment: Increased time to perform transfers, max encouragement to direct to task.   Ambulation/Gait                 Stairs            Wheelchair Mobility    Modified Rankin (Stroke Patients Only) Modified Rankin (Stroke Patients Only) Pre-Morbid Rankin Score: No  symptoms Modified Rankin: Severe disability     Balance     Sitting balance-Leahy Scale: Fair     Standing balance support: During functional activity Standing balance-Leahy Scale: Poor                      Cognition Arousal/Alertness: Awake/alert Behavior During Therapy: Impulsive Overall Cognitive Status: Impaired/Different from baseline Area of Impairment: Orientation;Attention;Following commands;Safety/judgement;Awareness;Problem solving Orientation Level: Disoriented to;Place;Time;Situation Current Attention Level: Focused Memory: Decreased short-term memory Following Commands: Follows one step commands with increased time Safety/Judgement: Decreased awareness of safety;Decreased awareness of deficits Awareness: Intellectual Problem Solving: Slow processing;Decreased initiation;Requires verbal cues;Requires tactile cues General Comments: Patient extremely distractible during session, max cues to attend to task.    Exercises Other Exercises Other Exercises: sitting exercises performed at EOB (Toe taps, Long arc quads. Attempted knee flexion however, patient too easily distracted to complete    General Comments        Pertinent Vitals/Pain Faces Pain Scale: No hurt    Home Living                      Prior Function            PT Goals (current goals can now be found in the care plan section) Acute Rehab PT Goals Patient Stated Goal: none stated PT Goal Formulation: Patient unable to participate in goal setting Time For Goal Achievement: 07/09/16 Potential to Achieve Goals: Fair Progress towards PT goals: Progressing toward goals    Frequency  Min 3X/week      PT Plan Current plan remains appropriate    Co-evaluation PT/OT/SLP Co-Evaluation/Treatment: Yes           End of Session   Activity Tolerance: Patient limited by fatigue Patient left: in chair;with call bell/phone within reach;with chair alarm set     Time:  1610-9604 PT Time Calculation (min) (ACUTE ONLY): 18 min  Charges:  $Therapeutic Activity: 8-22 mins                    G CodesFabio Asa 07/21/2016, 3:42 PM Charlotte Crumb, PT DPT  [phone]

## 2016-06-28 NOTE — Progress Notes (Signed)
Speech Language Pathology Treatment: Dysphagia  Patient Details Name: Austin Sawyer MRN: [id number] DOB: 11/17/1934 Today's Date: 06/28/2016 Time: [id number] SLP Time Calculation (min) (ACUTE ONLY): 32 min  Assessment / Plan / Recommendation Clinical Impression  Pt seen with daughter at bedside. Pt awake, but not fully alert, requires max verbal cues to follow one step directions. Only able to focus attention intermittently to feeding tasks even with hand over hand assist. Pt will open mouth to accept bolus, and briefly manipulation, but then holds without triggering a swallow unless max verbal cues are given. No coughing observed with max assist given, but suspect during a meal with staff pt is not consistently initiating a swallow and has coughing episodes when liquids spill to pharynx. Pt is at high risk of aspiration given waxing and waning mentation. Will need to demonstrate improved arousal and attention to PO prior to initiaiting diet regardless of potential function on an objective test. Recommend pt have short term alternate means of nutrition until arousal improves.    HPI HPI: Austin FusiLeon Sawyer an 81 y.o.malewith a flutter,history of stroke, cervicalgia, in remission from ETOH abuse, diabetes, dementia, intracranial hemorrhage, arachnoid hemorrhage back in 2016. Patient presents to the hospital with confusion. Head CT was obtained and showed a acute hemorrhage in the right frontal lobe. Per chart there is a midline shift that measures up to 4 mm. BSE 02/16/15 following stroke recommended NPO. Pt able to initiate Dys 1, thin 2 days later and upgraded to Dys 2 during that admission.      SLP Plan  Continue with current plan of care     Recommendations  Diet recommendations: NPO Medication Administration: Via alternative means                Plan: Continue with current plan of care       GO               [facility] GwinnettBonnie Kelise Kuch, MA CCC-SLP [phone]  Claudine Sawyer, Austin Grays  Austin Sawyer 06/28/2016, 9:16 AM

## 2016-06-29 ENCOUNTER — Inpatient Hospital Stay (HOSPITAL_COMMUNITY): Payer: Medicare Other

## 2016-06-29 DIAGNOSIS — R1314 Dysphagia, pharyngoesophageal phase: Secondary | ICD-10-CM

## 2016-06-29 DIAGNOSIS — F0391 Unspecified dementia with behavioral disturbance: Secondary | ICD-10-CM

## 2016-06-29 DIAGNOSIS — Z8679 Personal history of other diseases of the circulatory system: Secondary | ICD-10-CM

## 2016-06-29 LAB — BASIC METABOLIC PANEL
Anion gap: 10 (ref 5–15)
BUN: 23 mg/dL — ABNORMAL HIGH (ref 6–20)
CALCIUM: 8.8 mg/dL — AB (ref 8.9–10.3)
CHLORIDE: 112 mmol/L — AB (ref 101–111)
CO2: 20 mmol/L — AB (ref 22–32)
CREATININE: 0.92 mg/dL (ref 0.61–1.24)
GFR calc Af Amer: >60 mL/min (ref >60)
GFR calc non Af Amer: >60 mL/min (ref >60)
GLUCOSE: 110 mg/dL — AB (ref 65–99)
Potassium: 3.7 mmol/L (ref 3.5–5.1)
Sodium: 142 mmol/L (ref 135–145)

## 2016-06-29 LAB — CBC
HEMATOCRIT: 39.8 % (ref 39.0–52.0)
HEMOGLOBIN: 12.8 g/dL — AB (ref 13.0–17.0)
MCH: 29 pg (ref 26.0–34.0)
MCHC: 32.2 g/dL (ref 30.0–36.0)
MCV: 90 fL (ref 78.0–100.0)
Platelets: 179 10*3/uL (ref 150–400)
RBC: 4.42 MIL/uL (ref 4.22–5.81)
RDW: 14.1 % (ref 11.5–15.5)
WBC: 11.1 10*3/uL — ABNORMAL HIGH (ref 4.0–10.5)

## 2016-06-29 LAB — GLUCOSE, CAPILLARY
GLUCOSE-CAPILLARY: 107 mg/dL — AB (ref 65–99)
GLUCOSE-CAPILLARY: 99 mg/dL (ref 65–99)
Glucose-Capillary: 103 mg/dL — ABNORMAL HIGH (ref 65–99)
Glucose-Capillary: 100 mg/dL — ABNORMAL HIGH (ref 65–99)
Glucose-Capillary: 127 mg/dL — ABNORMAL HIGH (ref 65–99)
Glucose-Capillary: 133 mg/dL — ABNORMAL HIGH (ref 65–99)

## 2016-06-29 MED ORDER — HYDRALAZINE HCL 20 MG/ML IJ SOLN
10.0000 mg | Freq: Once | INTRAMUSCULAR | Status: AC
  Administered 2016-06-29: 10 mg via INTRAVENOUS

## 2016-06-29 MED ORDER — HYDRALAZINE HCL 50 MG PO TABS
50.0000 mg | ORAL_TABLET | Freq: Three times a day (TID) | ORAL | Status: DC
  Administered 2016-06-29 – 2016-07-06 (×20): 50 mg via ORAL
  Filled 2016-06-29 (×21): qty 1

## 2016-06-29 MED ORDER — GABAPENTIN 250 MG/5ML PO SOLN
300.0000 mg | Freq: Every day | ORAL | Status: DC
  Administered 2016-06-29 – 2016-07-06 (×7): 300 mg
  Filled 2016-06-29 (×9): qty 6

## 2016-06-29 MED ORDER — QUETIAPINE FUMARATE 25 MG PO TABS
25.0000 mg | ORAL_TABLET | Freq: Every day | ORAL | Status: DC
  Administered 2016-06-29 – 2016-07-05 (×7): 25 mg via ORAL
  Filled 2016-06-29 (×8): qty 1

## 2016-06-29 MED ORDER — JEVITY 1.2 CAL PO LIQD
1000.0000 mL | ORAL | Status: DC
  Administered 2016-06-29 – 2016-06-30 (×2): 1000 mL
  Filled 2016-06-29 (×8): qty 1000

## 2016-06-29 NOTE — Progress Notes (Signed)
STROKE TEAM PROGRESS NOTE   SUBJECTIVE (INTERVAL HISTORY) No family is at bedside. Pt is awake alert sitting in chair but still confused, and left-sided neglect and left hemianopia. On tube feeding.   OBJECTIVE Temp:  [98.1 F (36.7 C)-100 F (37.8 C)] 99.5 F (37.5 C) (09/26 1600) Pulse Rate:  [58-77] 68 (09/26 1745) Cardiac Rhythm: Sinus bradycardia (09/26 0800) Resp:  [11-24] 21 (09/26 1745) BP: (116-193)/(53-110) 147/79 (09/26 1745) SpO2:  [95 %-100 %] 99 % (09/26 1745) Weight:  [203 lb 14.8 oz (92.5 kg)] 203 lb 14.8 oz (92.5 kg) (09/26 0500)  CBC:   Recent Labs Lab 06/24/16 1352  06/28/16 0532 06/29/16 0539  WBC 13.1*  --  13.0* 11.1*  NEUTROABS 10.6*  --   --   --   HGB 13.9  < > 13.8 12.8*  HCT 42.4  < > 41.5 39.8  MCV 90.2  --  88.3 90.0  PLT 202  --  200 179  < > = values in this interval not displayed.  Basic Metabolic Panel:   Recent Labs Lab 06/24/16 1352 06/24/16 1400 06/29/16 0539  NA 137 139 142  K 4.2 4.2 3.7  CL 102 101 112*  CO2 25  --  20*  GLUCOSE 111* 105* 110*  BUN 17 20 23*  CREATININE 1.12 1.20 0.92  CALCIUM 9.8  --  8.8*    Lipid Panel:     Component Value Date/Time   CHOL 216 (H) 06/28/2016 0347   TRIG 114 06/28/2016 0347   HDL 35 (L) 06/28/2016 0347   CHOLHDL 6.2 06/28/2016 0347   VLDL 23 06/28/2016 0347   LDLCALC 158 (H) 06/28/2016 0347   HgbA1c:  Lab Results  Component Value Date   HGBA1C 6.0 (H) 02/09/2015   Urine Drug Screen:     Component Value Date/Time   LABOPIA NONE DETECTED 02/08/2015 1823   COCAINSCRNUR NONE DETECTED 02/08/2015 1823   LABBENZ NONE DETECTED 02/08/2015 1823   AMPHETMU NONE DETECTED 02/08/2015 1823   THCU NONE DETECTED 02/08/2015 1823   LABBARB NONE DETECTED 02/08/2015 1823      IMAGING I have personally reviewed the radiological images below and agree with the radiology interpretations.  Ct Head Wo Contrast 06/24/2016 1. ~32 cc right frontal acute parenchymal hematoma. Mild local  subarachnoid extension. 4 mm midline shift.  2. Gliosis from remote left occipital lobar hemorrhage in 2016. Possible amyloid angiopathy, but no typical chronic hemorrhagic foci on 2016 brain MRI. Question hemorrhagic conversion of infarct.   Mr Brain Wo Contrast  06/26/2016 Moderate to severely motion degraded examination. Acute RIGHT frontal lobe 2.8 x 5.9 x 3.7 cm hematoma, resulting in mild RIGHT to LEFT midline shift. Old LEFT occipital lobe hemorrhagic infarct.  EEG 06/25/2016 IMPRESSION: This is an abnormal EEG demonstrating a mild diffuse slowing of electrocerebral activity.  This can be seen in a wide variety of encephalopathic state including those of a toxic, metabolic, or degenerative nature.  There were no focal, hemispheric, or lateralizing features.  No epileptiform activity was recorded.  TTE pending   PHYSICAL EXAM  Temp:  [98.1 F (36.7 C)-100 F (37.8 C)] 99.5 F (37.5 C) (09/26 1600) Pulse Rate:  [58-77] 68 (09/26 1745) Resp:  [11-24] 21 (09/26 1745) BP: (116-193)/(53-110) 147/79 (09/26 1745) SpO2:  [95 %-100 %] 99 % (09/26 1745) Weight:  [203 lb 14.8 oz (92.5 kg)] 203 lb 14.8 oz (92.5 kg) (09/26 0500)  General - Well nourished, well developed, in no apparent distress.  Ophthalmologic -  Fundi not visualized due to noncooperation.  Cardiovascular - Regular rate and rhythm.  Mental Status -  Level of arousal and orientation to self, but not orientated to place, time or people. Language exam showed paucity of speech, able to follow simple commands after several request, not able to name but able to repeat simple sentences. Left neglect.  Cranial Nerves II - XII - II - Visual field exam showed left visual field neglect. III, IV, VI - right gaze preference. V - Facial sensation intact bilaterally. VII - left facial droop. VIII - Hearing & vestibular intact bilaterally. X - Palate elevates symmetrically. XI - Chin turning & shoulder shrug intact  bilaterally. XII - Tongue protrusion to the left.  Motor Strength - The patient's strength was 5/5 RUE, LUE 2/5 with pain stimulation, RLE 5/5 and LLE 4/5.  Bulk was normal and fasciculations were absent.   Motor Tone - Muscle tone was assessed at the neck and appendages and was normal.  Reflexes - The patient's reflexes were 1+ in all extremities and he had bilateral babinski reflexes.  Sensory - Light touch, temperature/pinprick were assessed and were symmetrical.    Coordination - no cooperative on test.  Tremor was absent.  Gait and Station - not tested due to safety concerns   ASSESSMENT/PLAN Mr. Harl Wiechmann is a 81 y.o. male with history of HTN, diabetes, CAD, dementia, cured atrial flutter not on [facility], alcohol abuse, previous lobar ICH presenting with confusion, "problems" getting around x 1 week. CT showed a R frontal ICH with midline shift. Noncompliant with medications, not taking his HTN medications.   Stroke:  right frontal ICH with 4mm midline shift in setting of hypertension, etiology could be due to hypertensive due to noncompliance or CAA given recurrent lobar ICHs  Resultant HA, baseline confusion  CT - right frontal ICH  MRI - Acute RIGHT frontal lobe 2.8 x 5.9 x 3.7 cm hematoma, stable size  EEG - mild general slowing, no seizure - d/c keppra  2D echo - pending  Heparin subq for VTE prophylaxis  aspirin 81 mg daily prior to admission, now on No antithrombotic given hemorrhage. Due to concern of CAA etiology, will not recommend antiplatelet in the future.  Ongoing aggressive stroke risk factor management  Therapy recommendations: SNF  Disposition:  Pending  History of lobar ICH  02/2015 left parietal occipital ICH/IVH/SAH/SDH  BP high and controlled with 4 BP meds at discharge  Not compliant with BP meds  Considered hypertensive ICH and put on ASA 81 later  Resultant significant language deficit and cognitive impairment  Atrial Fibrillation  Home  anticoagulation:  none   On coumadin until 2011 then off. Dr. [facility] considered is Aflutter cured  Hypertension  On coreg, catapres, hydralazine, HCTZ, lisinopril prior to admission - not taking per daughter was noncompliant  Off nicardipine for BP control  Currently BP stable  Resumed coreg, hydralazine and lisinopril for BP control  SBP goal < 160 Long-term BP goal normotensive  Hyperlipidemia  LDL 158, goal < 70  Put on lipitor 20mg   Continue statin at discharge  Diabetes type II  On glucophage at home  HbA1c 6.0  Resume glucophage  Vascular dementia  Baseline dementia  On seroquel and remoron at home  Resume seroquel at bedtime for sundowning.  Other Stroke Risk Factors  Advanced age  Obesity, Body mass index is 30.11 kg/m., recommend weight loss, diet and exercise as appropriate   Hx stroke/TIA  02/2105 L occipital hemorhage with SAH,  SDH and IVH. D/c to short term SNF. Resultant R hemianopia, significant language deficit and cognitive impairment  Coronary artery disease - MI  Unspecified sleep apnea  Noncompliant with medications and medical management  Atrial fibrillation without anticoagulation  This patient is critically ill due to recurrent lobar ICH, hypertensive emergency, dementia and at significant risk of neurological worsening, death form recurrent ICH, encephalopathy, seizure. This patient's care requires constant monitoring of vital signs, hemodynamics, respiratory and cardiac monitoring, review of multiple databases, neurological assessment, discussion with family, other specialists and medical decision making of high complexity. I spent 35 minutes of neurocritical care time in the care of this patient.   Marvel PlanJindong Kamylle Axelson, MD PhD Stroke Neurology 06/29/2016 6:03 PM         .        To contact Stroke Continuity provider, please refer to [email or web address]. After hours, contact General Neurology

## 2016-06-29 NOTE — Plan of Care (Signed)
Problem: Nutrition: Goal: Dietary intake will improve Outcome: Progressing Tube feedings

## 2016-06-29 NOTE — Progress Notes (Signed)
Provided update to [facility] progress.  Faxed H&P, last 2 days progress notes to Anola GurneyJoseph Cambpell, admissions coordinator at [facility], per his request.  CSW notified that pt will need SNF at dc.    Quintella BatonJulie W. Linzy Darling, RN, BSN  Trauma/Neuro ICU Case Manager [phone]

## 2016-06-29 NOTE — Progress Notes (Signed)
Initial Nutrition Assessment  DOCUMENTATION CODES:   Obesity unspecified  INTERVENTION:   Jevity 1.2 @ 65 ml/hr (1560 ml per day) 30 ml Prostat BID Provides: 2072 kcal, 116 grams protein, and 1263 ml H2O.    NUTRITION DIAGNOSIS:   Inadequate oral intake related to dysphagia as evidenced by NPO status.  GOAL:   Patient will meet greater than or equal to 90% of their needs  MONITOR:   TF tolerance, I & O's, Labs  REASON FOR ASSESSMENT:   Consult Enteral/tube feeding initiation and management  ASSESSMENT:   Austin Sawyer is an 81 y.o. male with a flutter, history of stroke, cervicalgia, in remission from ETOH abuse, diabetes, dementia, intracranial hemorrhage, arachnoid hemorrhage back in 2016. Patient presents to the hospital with confusion. Head CT was obtained and showed a acute hemorrhage in the right frontal lobe. Per chart there is a midline shift that measures up to 4 mm.  9/26 Cortrak placed (tip in stomach), TF protocol started (Vital High Protein @ 40 ml/hr with 30 ml Prostat BID) Medications reviewed and include: glucophage, senokot-s (held today for loose stools) Labs reviewed: CBG's: 103-115  Nutrition-Focused physical exam completed. Findings are no fat depletion, no muscle depletion, and no edema.   Diet Order:     Skin:  Reviewed, no issues  Last BM:  9/26  Height:   Ht Readings from Last 1 Encounters:  06/24/16 5\' 9"  (1.753 m)    Weight:   Wt Readings from Last 1 Encounters:  06/29/16 203 lb 14.8 oz (92.5 kg)    Ideal Body Weight:  72.7 kg  BMI:  Body mass index is 30.11 kg/m.  Estimated Nutritional Needs:   Kcal:  2000-2200  Protein:  110-120 grams  Fluid:  > 2 l/day  EDUCATION NEEDS:   No education needs identified at this time  Kendell BaneHeather Tipton Ballow RD, LDN, CNSC [phone] Pager [phone] After Hours Pager

## 2016-06-29 NOTE — Progress Notes (Signed)
Occupational Therapy Treatment Patient Details Name: Austin Sawyer MRN: [id number] DOB: 12/27/1934 Today's Date: 06/29/2016    History of present illness Austin Sawyer is an 81 y.o. male with a flutter, history of stroke, cervicalgia, in remission from ETOH abuse, diabetes, dementia, intracranial hemorrhage, arachnoid hemorrhage back in 2016. Patient presents to the hospital with confusion. Head CT was obtained and showed a acute hemorrhage in the right frontal lobe. Per chart tere is a midline shift that measures up to 4 mm   OT comments  Pt noted to have tone in L Ue and unable to sustain shoulder flexion against gravity. Pt responds to pain in all 4 extremities but only actively moving R UE. Pt required total +2 total (A) for aspects of session. Pt demonstrates less participation compared to PT notes and previous OT note.    Follow Up Recommendations  SNF;Supervision/Assistance - 24 hour    Equipment Recommendations  Other (comment) (defer SNF)    Recommendations for Other Services      Precautions / Restrictions Precautions Precautions: Fall       Mobility Bed Mobility Overal bed mobility: Needs Assistance;+2 for physical assistance;+ 2 for safety/equipment Bed Mobility: Supine to Sit;Rolling Rolling: Total assist;+2 for physical assistance;+2 for safety/equipment   Supine to sit: Total assist;+2 for physical assistance;+2 for safety/equipment Sit to supine: Total assist;+2 for physical assistance;+2 for safety/equipment   General bed mobility comments: Pt required (A) to initiate neck rotation for rolling and reaching hand over hand with R hand and unable to sustain reach  Transfers                 General transfer comment: used a maxisky to chair     Balance Overall balance assessment: Needs assistance Sitting-balance support: Bilateral upper extremity supported;Feet supported Sitting balance-Leahy Scale: Zero   Postural control: Posterior lean Standing balance  support: Bilateral upper extremity supported;During functional activity Standing balance-Leahy Scale: Zero                     ADL Overall ADL's : Needs assistance/impaired Eating/Feeding: NPO   Grooming: Wash/dry face;Maximal assistance Grooming Details (indicate cue type and reason): Pt not following command. Pt attempting to reach for face but when assisted OT meeting resistance Upper Body Bathing: Total assistance   Lower Body Bathing: Total assistance   Upper Body Dressing : Total assistance   Lower Body Dressing: Total assistance   Toilet Transfer: +2 for physical assistance;Total assistance             General ADL Comments: Pt not following commands for transfer. Pt requires (A) at EOB. pt return to supine due to incontinence and hoyer lift via maxisky to chair. RN aware and present throughout session      Vision                     Perception     Praxis      Cognition   Behavior During Therapy: Impulsive Overall Cognitive Status: Impaired/Different from baseline Area of Impairment: Orientation;Attention;Following commands;Safety/judgement;Awareness;Problem solving Orientation Level: Disoriented to;Place;Time;Situation;Person Current Attention Level: Focused Memory: Decreased recall of precautions;Decreased short-term memory  Following Commands: Follows one step commands with increased time;Follows one step commands inconsistently Safety/Judgement: Decreased awareness of deficits;Decreased awareness of safety Awareness: Intellectual Problem Solving: Slow processing;Decreased initiation;Difficulty sequencing;Requires verbal cues;Requires tactile cues General Comments: Pt incontinent of bowel and unaware. pt  not following commands. Pt delayed responses and fixated on attempting to reach for nasal panda tubing  so localizing to object    Extremity/Trunk Assessment               Exercises     Shoulder Instructions       General  Comments      Pertinent Vitals/ Pain       Pain Assessment: No/denies pain  Home Living                                          Prior Functioning/Environment              Frequency  Min 2X/week        Progress Toward Goals  OT Goals(current goals can now be found in the care plan section)  Progress towards OT goals: Not progressing toward goals - comment  Acute Rehab OT Goals Patient Stated Goal: none stated OT Goal Formulation: With patient Time For Goal Achievement: 07/09/16 Potential to Achieve Goals: Good ADL Goals Pt Will Perform Grooming: with min assist;standing Pt Will Perform Upper Body Bathing: with set-up;sitting Pt Will Perform Lower Body Bathing: with min assist;sit to/from stand Pt Will Transfer to Toilet: ambulating;bedside commode;with min assist Pt/caregiver will Perform Home Exercise Program: Increased ROM;Increased strength;Both right and left upper extremity;With Supervision Additional ADL Goal #1: Pt will follow one step commands 75% of the time with min verbal cues.  Plan Discharge plan remains appropriate    Co-evaluation                 End of Session Equipment Utilized During Treatment: Gait belt   Activity Tolerance Patient tolerated treatment well   Patient Left in chair;with call bell/phone within reach;with chair alarm set;with restraints reapplied   Nurse Communication Mobility status;Precautions        Time: 4540-9811 OT Time Calculation (min): 27 min  Charges: OT General Charges $OT Visit: 1 Procedure OT Treatments $Self Care/Home Management : 23-37 mins  Boone Master B 06/29/2016, 9:28 AM   Mateo Flow   OTR/L Pager: [phone] Office: [phone] .

## 2016-06-29 NOTE — Clinical Social Work Note (Signed)
CSW had conversation with the daughter Dois DavenportSandra by phone regarding placement. She requests [facility]. CSW explained that spouse Wilnette Kaleshelma would have to be contacted as she is the decision maker since Dois DavenportSandra is not a documented HPOA. Wilnette Kaleshelma does agree with choice for [facility], but states that Dois DavenportSandra is NOT to make any decisions regarding the patient's care. CSW will complete FL2 and fax out referral.    Roddie McBryant Mikah Rottinghaus MSW, [place]CS[place], [place], [phone]

## 2016-06-30 ENCOUNTER — Inpatient Hospital Stay (HOSPITAL_COMMUNITY): Payer: Medicare Other

## 2016-06-30 DIAGNOSIS — E1159 Type 2 diabetes mellitus with other circulatory complications: Secondary | ICD-10-CM

## 2016-06-30 DIAGNOSIS — I6789 Other cerebrovascular disease: Secondary | ICD-10-CM

## 2016-06-30 DIAGNOSIS — I10 Essential (primary) hypertension: Secondary | ICD-10-CM

## 2016-06-30 LAB — BASIC METABOLIC PANEL
ANION GAP: 10 (ref 5–15)
BUN: 21 mg/dL — ABNORMAL HIGH (ref 6–20)
CO2: 24 mmol/L (ref 22–32)
Calcium: 8.8 mg/dL — ABNORMAL LOW (ref 8.9–10.3)
Chloride: 110 mmol/L (ref 101–111)
Creatinine, Ser: 0.94 mg/dL (ref 0.61–1.24)
GLUCOSE: 137 mg/dL — AB (ref 65–99)
POTASSIUM: 3.5 mmol/L (ref 3.5–5.1)
SODIUM: 144 mmol/L (ref 135–145)

## 2016-06-30 LAB — GLUCOSE, CAPILLARY
GLUCOSE-CAPILLARY: 118 mg/dL — AB (ref 65–99)
GLUCOSE-CAPILLARY: 131 mg/dL — AB (ref 65–99)
GLUCOSE-CAPILLARY: 125 mg/dL — AB (ref 65–99)
GLUCOSE-CAPILLARY: 110 mg/dL — AB (ref 65–99)
Glucose-Capillary: 141 mg/dL — ABNORMAL HIGH (ref 65–99)

## 2016-06-30 LAB — ECHOCARDIOGRAM COMPLETE
HEIGHTINCHES: 69 in
Weight: 3372.16 oz

## 2016-06-30 LAB — CBC
HCT: 37.6 % — ABNORMAL LOW (ref 39.0–52.0)
Hemoglobin: 12.1 g/dL — ABNORMAL LOW (ref 13.0–17.0)
MCH: 29 pg (ref 26.0–34.0)
MCHC: 32.2 g/dL (ref 30.0–36.0)
MCV: 90.2 fL (ref 78.0–100.0)
PLATELETS: 206 10*3/uL (ref 150–400)
RBC: 4.17 MIL/uL — AB (ref 4.22–5.81)
RDW: 14.3 % (ref 11.5–15.5)
WBC: 9.3 10*3/uL (ref 4.0–10.5)

## 2016-06-30 MED ORDER — PERFLUTREN LIPID MICROSPHERE
1.0000 mL | INTRAVENOUS | Status: AC | PRN
  Filled 2016-06-30: qty 10

## 2016-06-30 NOTE — Progress Notes (Signed)
Noted pt's continuation of NPO status after repeat swallowing evaluation today.  Recommendation is for SNF at discharge.  May need to consider PEG placement should pt be unable to progress to diet.    Quintella BatonJulie W. Eddison Searls, RN, BSN  Trauma/Neuro ICU Case Manager [phone]

## 2016-06-30 NOTE — Clinical Social Work Note (Signed)
Clinical Social Worker continuing to follow patient and family for support and discharge planning needs.  CSW spoke with patient wife over the phone and provided bed offers.  Patient wife is requesting placement close to her home.  CSW to leave facility list at bedside for patient wife to look over tomorrow.    Patient wife does express concern that she does not know password attached to patient account and therefore is not able to receive any information over the phone.  CSW notified RN that patient daughter created the password and will not share with patient wife - patient wife has the authority/right to change password for information moving forward.  There is a large family strain between mother and children.  CSW remains available for support and to facilitate patient discharge needs once medically stable.  Macario GoldsJesse Jamel Dun[place], [place]CS[place] [phone]

## 2016-06-30 NOTE — Progress Notes (Signed)
STROKE TEAM PROGRESS NOTE   SUBJECTIVE (INTERVAL HISTORY) No family is at bedside. Pt is awake alert undergoing 2D echo. On tube feeding. Not passed swallow. May need PEG tube for SNF placement. Wife not available at bedside or over the phone.   OBJECTIVE Temp:  [97.8 F (36.6 C)-99.2 F (37.3 C)] 99.2 F (37.3 C) (09/27 1600) Pulse Rate:  [65-74] 69 (09/27 1500) Cardiac Rhythm: Normal sinus rhythm (09/27 0800) Resp:  [13-24] 21 (09/27 1700) BP: (92-169)/(51-98) 141/75 (09/27 1700) SpO2:  [93 %-99 %] 98 % (09/27 1500) Weight:  [210 lb 12.2 oz (95.6 kg)] 210 lb 12.2 oz (95.6 kg) (09/27 0600)  CBC:   Recent Labs Lab 06/24/16 1352  06/29/16 0539 06/30/16 0316  WBC 13.1*  < > 11.1* 9.3  NEUTROABS 10.6*  --   --   --   HGB 13.9  < > 12.8* 12.1*  HCT 42.4  < > 39.8 37.6*  MCV 90.2  < > 90.0 90.2  PLT 202  < > 179 206  < > = values in this interval not displayed.  Basic Metabolic Panel:   Recent Labs Lab 06/29/16 0539 06/30/16 0316  NA 142 144  K 3.7 3.5  CL 112* 110  CO2 20* 24  GLUCOSE 110* 137*  BUN 23* 21*  CREATININE 0.92 0.94  CALCIUM 8.8* 8.8*    Lipid Panel:     Component Value Date/Time   CHOL 216 (H) 06/28/2016 0347   TRIG 114 06/28/2016 0347   HDL 35 (L) 06/28/2016 0347   CHOLHDL 6.2 06/28/2016 0347   VLDL 23 06/28/2016 0347   LDLCALC 158 (H) 06/28/2016 0347   HgbA1c:  Lab Results  Component Value Date   HGBA1C 6.0 (H) 02/09/2015   Urine Drug Screen:     Component Value Date/Time   LABOPIA NONE DETECTED 02/08/2015 1823   COCAINSCRNUR NONE DETECTED 02/08/2015 1823   LABBENZ NONE DETECTED 02/08/2015 1823   AMPHETMU NONE DETECTED 02/08/2015 1823   THCU NONE DETECTED 02/08/2015 1823   LABBARB NONE DETECTED 02/08/2015 1823      IMAGING I have personally reviewed the radiological images below and agree with the radiology interpretations.  Ct Head Wo Contrast 06/24/2016 1. ~32 cc right frontal acute parenchymal hematoma. Mild local  subarachnoid extension. 4 mm midline shift.  2. Gliosis from remote left occipital lobar hemorrhage in 2016. Possible amyloid angiopathy, but no typical chronic hemorrhagic foci on 2016 brain MRI. Question hemorrhagic conversion of infarct.   06/30/2016 IMPRESSION: Large right frontal hematoma unchanged. 4 mm of midline shift unchanged. No new findings.    Mr Brain Wo Contrast  06/26/2016 Moderate to severely motion degraded examination. Acute RIGHT frontal lobe 2.8 x 5.9 x 3.7 cm hematoma, resulting in mild RIGHT to LEFT midline shift. Old LEFT occipital lobe hemorrhagic infarct.  EEG 06/25/2016 IMPRESSION: This is an abnormal EEG demonstrating a mild diffuse slowing of electrocerebral activity.  This can be seen in a wide variety of encephalopathic state including those of a toxic, metabolic, or degenerative nature.  There were no focal, hemispheric, or lateralizing features.  No epileptiform activity was recorded.  TTE  - Left ventricle: The cavity size was normal. There was moderate   concentric hypertrophy. Systolic function was normal. The   estimated ejection fraction was in the range of 60% to 65%.   Diffuse hypokinesis. Doppler parameters are consistent with   abnormal left ventricular relaxation (grade 1 diastolic   dysfunction). Doppler parameters are consistent with elevated  ventricular end-diastolic filling pressure. - Ventricular septum: Septal motion showed paradox. - Aortic valve: There was mild stenosis. Valve area (VTI): 2.48   cm^2. Valve area (Vmax): 2.06 cm^2. Valve area (Vmean): 2.2 cm^2. - Aortic root: The aortic root was normal in size. - Mitral valve: Structurally normal valve. - Right ventricle: The cavity size was normal. Wall thickness was   normal. Systolic function was normal. - Tricuspid valve: There was no regurgitation. - Pulmonary arteries: Systolic pressure could not be accurately   estimated. - Inferior vena cava: The vessel was normal in size. -  Pericardium, extracardiac: There was no pericardial effusion. Impressions: - Poor quality study with limited endocardial visualization,   however there appears to be normal biventricular function.  PHYSICAL EXAM  Temp:  [97.8 F (36.6 C)-99.2 F (37.3 C)] 99.2 F (37.3 C) (09/27 1600) Pulse Rate:  [65-74] 69 (09/27 1500) Resp:  [13-24] 21 (09/27 1700) BP: (92-169)/(51-98) 141/75 (09/27 1700) SpO2:  [93 %-99 %] 98 % (09/27 1500) Weight:  [210 lb 12.2 oz (95.6 kg)] 210 lb 12.2 oz (95.6 kg) (09/27 0600)  General - Well nourished, well developed, in no apparent distress.  Ophthalmologic - Fundi not visualized due to noncooperation.  Cardiovascular - Regular rate and rhythm.  Mental Status -  Level of arousal and orientation to self, but not orientated to place, time or people. Language exam showed paucity of speech, able to follow simple commands after several request, not able to name but able to repeat simple sentences. Left neglect.  Cranial Nerves II - XII - II - Visual field exam showed left visual field neglect. III, IV, VI - right gaze preference. V - Facial sensation intact bilaterally. VII - left facial droop. VIII - Hearing & vestibular intact bilaterally. X - Palate elevates symmetrically. XI - Chin turning & shoulder shrug intact bilaterally. XII - Tongue protrusion to the left.  Motor Strength - The patient's strength was 5/5 RUE, LUE 2/5 with pain stimulation, RLE 5/5 and LLE 4/5.  Bulk was normal and fasciculations were absent.   Motor Tone - Muscle tone was assessed at the neck and appendages and was normal.  Reflexes - The patient's reflexes were 1+ in all extremities and he had bilateral babinski reflexes.  Sensory - Light touch, temperature/pinprick were assessed and were symmetrical.    Coordination - no cooperative on test.  Tremor was absent.  Gait and Station - not tested due to safety concerns   ASSESSMENT/PLAN [name] is a 81 y.o. male  with history of HTN, diabetes, CAD, dementia, cured atrial flutter not on AC, alcohol abuse, previous lobar ICH presenting with confusion, "problems" getting around x 1 week. CT showed a R frontal ICH with midline shift. Noncompliant with medications, not taking his HTN medications.   Stroke:  right frontal I[facility] Sout[facility]Orthoin[facility] Surgery[facility] BeaufoOver[facility] MedicEd[facility] VeteraP[facility] MemoriMiLLCreek CommuniOtsego MemoriBeach[facility] Memorial HospitaMicheal Likensrksng of hypertension, etiology could be due to hypertensive due to noncompliance or CAA given recurrent lobar ICHs  Resultant HA, baseline confusion  CT - right frontal ICH  MRI - Acute RIGHT frontal lobe 2.8 x 5.9 x 3.7 cm hematoma, stable size  Repeat CT - stable hematoma and increased edema  EEG - mild general slowing, no seizure - d/c keppra  2D echo - EF 60-65%  Heparin subq for VTE prophylaxis  aspirin 81 mg daily prior to admission, now on No antithrombotic given hemorrhage. Due to concern of CAA etiology, will not recommend antiplatelet in the future.  Ongoing aggressive stroke risk factor management  Therapy recommendations: SNF  Disposition:  Pending  History of lobar ICH  02/2015 left parietal occipital ICH/IVH/SAH/SDH  BP high and controlled with 4 BP meds at discharge  Not compliant with BP meds  Considered hypertensive ICH and put on ASA 81 later  Resultant significant language deficit and cognitive impairment  Atrial Fibrillation  Home anticoagulation:  none   On coumadin until 2011 then off. Dr. [place] considered is Aflutter cured  Hypertension  On coreg, catapres, hydralazine, HCTZ, lisinopril prior to admission - not taking per daughter was noncompliant  Off nicardipine for BP control  Currently BP stable  Resumed coreg, hydralazine and lisinopril for BP control  SBP goal < 160 Long-term BP goal normotensive  Hyperlipidemia  LDL 158, goal < 70  Put on lipitor 20mg   Continue statin at discharge  Diabetes type II  On glucophage at home  HbA1c 6.0  Resume glucophage  Vascular  dementia  Baseline dementia  On seroquel and remoron at home  Resume seroquel at bedtime for sundowning.  Other Stroke Risk Factors  Advanced age  Obesity, Body mass index is 31.12 kg/m., recommend weight loss, diet and exercise as appropriate   Coronary artery disease - MI  Noncompliant with medications and medical management  This patient is critically ill due to recurrent lobar ICH, hypertensive emergency, dementia and at significant risk of neurological worsening, death form recurrent ICH, encephalopathy, seizure. This patient's care requires constant monitoring of vital signs, hemodynamics, respiratory and cardiac monitoring, review of multiple databases, neurological assessment, discussion with family, other specialists and medical decision making of high complexity. I spent 35 minutes of neurocritical care time in the care of this patient.   Marvel Plan, MD PhD Stroke Neurology 06/30/2016 6:30 PM         .        To contact Stroke Continuity provider, please refer to [email or web address]. After hours, contact General Neurology

## 2016-06-30 NOTE — NC FL2 (Signed)
[place] MEDICAID FL2 LEVEL OF CARE SCREENING TOOL     IDENTIFICATION  Patient Name: Austin Sawyer Birthdate: 04/30/1935 Sex: male Admission Date (Current Location): 06/24/2016  [facility] [place]   Facility and Address:  [facility], [address], [place], [place] [address]      Provider Number: [id number]  Attending Physician Name and Address:  Marvel PlanJindong Xu, MD  Relative Name and Phone Number:       Current Level of Care: Hospital Recommended Level of Care: Skilled Nursing Facility Prior Approval Number:    Date Approved/Denied:   PASRR Number:    Discharge Plan: SNF    Current Diagnoses: Patient Active Problem List   Diagnosis Date Noted  . Vascular dementia 11/24/2015  . Atrial flutter, unspecified 11/24/2015  . Cognitive impairment 08/19/2015  . Type 2 diabetes mellitus with other circulatory complications (HCC) 05/14/2015  . Chronic systolic congestive heart failure (HCC) 05/14/2015  . ICH (intracerebral hemorrhage) (HCC)   . HLD (hyperlipidemia)   . Agitation   . Accelerated hypertension   . Encephalopathy acute 02/17/2015  . SVT (supraventricular tachycardia) (HCC) 02/16/2015  . Cytotoxic brain edema (HCC)   . Encounter for feeding tube placement   . CHF (congestive heart failure) (HCC)   . Atelectasis   . Acute respiratory failure with hypoxemia (HCC) 02/10/2015  . Hemorrhage of brain, nontraumatic (HCC) 02/08/2015  . Stroke due to intracerebral hemorrhage (HCC) 02/08/2015  . Cervical disc disorder with radiculopathy of cervical region 09/14/2013  . Neck pain on left side 07/12/2013  . Paresthesia of left arm 07/12/2013  . Major depressive disorder, recurrent episode, severe (HCC) 04/30/2013  . Alcohol dependence in remission (HCC) 04/22/2013  . Hard of hearing 04/22/2013  . OBESITY, UNSPECIFIED 12/09/2009  . CARDIOMYOPATHY, SECONDARY 09/03/2009  . ATRIAL FLUTTER 09/03/2009  . Gout 03/12/2008  .  HYPERGLYCEMIA 02/14/2008  . ANGINA PECTORIS 10/03/2007  . BARRETTS ESOPHAGUS 10/03/2007  . ARTHRITIS 10/03/2007  . WEIGHT LOSS 04/06/2007  . ANXIETY DEPRESSION 03/14/2007  . ABUSE, ALCOHOL, IN REMISSION 03/08/2007  . CORONARY ARTERY DISEASE 03/08/2007  . LACUNAR INFARCTION 03/08/2007  . COLONOSCOPY, HX OF 03/08/2007  . HYPOGONADISM 01/25/2007  . Hyperlipidemia 01/25/2007  . Essential hypertension 01/25/2007  . CONGESTIVE HEART FAILURE 01/25/2007  . GERD 01/25/2007  . SLEEP APNEA 01/25/2007    Orientation RESPIRATION BLADDER Height & Weight     Self  O2 (2 L/min) Incontinent Weight: 95.6 kg (210 lb 12.2 oz) Height:  5\' 9"  (175.3 cm)  BEHAVIORAL SYMPTOMS/MOOD NEUROLOGICAL BOWEL NUTRITION STATUS      Incontinent Diet (Currently NPO)  AMBULATORY STATUS COMMUNICATION OF NEEDS Skin   Extensive Assist Verbally Skin abrasions (Right hip)                       Personal Care Assistance Level of Assistance  Total care       Total Care Assistance: Maximum assistance   Functional Limitations Info  Speech (slurred speech)     Speech Info: Impaired    SPECIAL CARE FACTORS FREQUENCY  PT (By licensed PT), OT (By licensed OT)     PT Frequency: 5 OT Frequency: 5            Contractures Contractures Info: Not present    Additional Factors Info  Code Status, Allergies Code Status Info: Full Code Allergies Info: NKA           Current Medications (06/30/2016):  This is the  current hospital active medication list Current Facility-Administered Medications  Medication Dose Route Frequency Provider Last Rate Last Dose  . acetaminophen (TYLENOL) tablet 650 mg  650 mg Oral Q4H PRN Ulice Dash, PA-C   650 mg at 06/29/16 1223   Or  . acetaminophen (TYLENOL) suppository 650 mg  650 mg Rectal Q4H PRN Ulice Dash, PA-C      . atorvastatin (LIPITOR) tablet 20 mg  20 mg Oral q1800 Marvel Plan, MD   20 mg at 06/29/16 1831  . carvedilol (COREG) tablet 25 mg  25 mg Oral BID WC  Zulfiqar Willodean Rosenthal, MD   25 mg at 06/30/16 0757  . feeding supplement (JEVITY 1.2 CAL) liquid 1,000 mL  1,000 mL Per Tube Continuous Marvel Plan, MD 65 mL/hr at 06/29/16 2300 1,000 mL at 06/29/16 2300  . feeding supplement (PRO-STAT SUGAR FREE 64) liquid 30 mL  30 mL Per Tube BID Marvel Plan, MD   30 mL at 06/29/16 2134  . gabapentin (NEURONTIN) 250 MG/5ML solution 300 mg  300 mg Per Tube Daily Marvel Plan, MD   300 mg at 06/29/16 1218  . heparin injection 5,000 Units  5,000 Units Subcutaneous Q8H Marvel Plan, MD   5,000 Units at 06/30/16 0610  . hydrALAZINE (APRESOLINE) injection 10 mg  10 mg Intravenous Q4H PRN Noel Christmas   10 mg at 06/30/16 0232  . hydrALAZINE (APRESOLINE) tablet 50 mg  50 mg Oral Q8H Marvel Plan, MD   50 mg at 06/30/16 1610  . lisinopril (PRINIVIL,ZESTRIL) tablet 20 mg  20 mg Oral Daily Marvel Plan, MD   20 mg at 06/29/16 1023  . metFORMIN (GLUCOPHAGE) tablet 500 mg  500 mg Oral Daily Marvel Plan, MD   500 mg at 06/29/16 1023  . morphine 2 MG/ML injection 2 mg  2 mg Intravenous Q4H PRN Anson Fret, MD      . pantoprazole sodium (PROTONIX) 40 mg/20 mL oral suspension 40 mg  40 mg Per Tube Daily Marvel Plan, MD   40 mg at 06/29/16 1023  . QUEtiapine (SEROQUEL) tablet 25 mg  25 mg Oral QHS Marvel Plan, MD   25 mg at 06/29/16 2135  . RESOURCE THICKENUP CLEAR   Oral PRN Anson Fret, MD      . senna-docusate (Senokot-S) tablet 1 tablet  1 tablet Oral BID Ulice Dash, PA-C   1 tablet at 06/29/16 2135     Discharge Medications: Please see discharge summary for a list of discharge medications.  Relevant Imaging Results:  Relevant Lab Results:   Additional Information SSN: [id number]  Venita Lick, LCSW

## 2016-06-30 NOTE — Progress Notes (Signed)
  Echocardiogram 2D Echocardiogram with Definity has been performed.  Nolon RodBrown, Tony 06/30/2016, 12:33 PM

## 2016-06-30 NOTE — Progress Notes (Signed)
Speech Language Pathology Treatment: Dysphagia;Cognitive-Linquistic  Patient Details Name: Candise CheLeon Mcginness MRN: [id number] DOB: 01/17/1935 Today's Date: 06/30/2016 Time: [id number] SLP Time Calculation (min) (ACUTE ONLY): 26 min  Assessment / Plan / Recommendation Clinical Impression   Pt repositioned to chair with PT present. Pt displayed coughing and wet vocal quality prior to PO administration likely due to poor management of secretions. Pt presents with left neglect/inattention despite maximal cueing. He was unable to maintain sustained attention during PO trials given maximal cueing. When given 1 trial of honey thick liquid pt had oral holding and a lack of swallow initiation. Despite maximal cueing to initiate a swallow pt did not and 100% of bolus was suctioned. SLP attempted additional trials and pt had poor oral acceptance. Recommend pt remain NPO as it is unsafe for oral intake at this time given poor mentation.   HPI HPI: [name] an 81 y.o.malewith a flutter,history of stroke, cervicalgia, in remission from ETOH abuse, diabetes, dementia, intracranial hemorrhage, arachnoid hemorrhage back in 2016. Patient presents to the hospital with confusion. Head CT was obtained and showed a acute hemorrhage in the right frontal lobe. Per chart there is a midline shift that measures up to 4 mm. BSE 02/16/15 following stroke recommended NPO. Pt able to initiate Dys 1, thin 2 days later and upgraded to Dys 2 during that admission.      SLP Plan  Continue with current plan of care     Recommendations  Diet recommendations: NPO Medication Administration: Via alternative means                Oral Care Recommendations: Oral care QID Follow up Recommendations: Skilled Nursing facility Plan: Continue with current plan of care       GO               Tollie EthHaleigh Ragan Sebastien Jackson, Student SLP  Caryl NeverHaleigh R Janiyla Long 06/30/2016, 12:26 PM

## 2016-06-30 NOTE — Progress Notes (Signed)
Pt arrived on unit via bed from [facility]. Welcomed, vital signs measured. Pt oriented to room, call light within reach and bed alarm on bed. Will continue to monitor. Lawson RadarHeather M Vail Vuncannon

## 2016-06-30 NOTE — Progress Notes (Signed)
Physical Therapy Treatment Patient Details Name: Austin CheLeon Cohea MRN: [id number] DOB: 05/24/1935 Today's Date: 06/30/2016    History of Present Illness Austin Sawyer is an 81 y.o. male with a flutter, history of stroke, cervicalgia, in remission from ETOH abuse, diabetes, dementia, intracranial hemorrhage, arachnoid hemorrhage back in 2016. Patient presents to the hospital with confusion. Head CT was obtained and showed a acute hemorrhage in the right frontal lobe. Per chart tere is a midline shift that measures up to 4 mm    PT Comments    Patient seen for mobility and activity progression. Patient required increased total assist for all aspects of mobility today. Noted to be more rigid on left side and shows complete left neglect despite max multimodal cues. This is a decline over previous function. Nsg notified.   Follow Up Recommendations  SNF     Equipment Recommendations   (defer to next venue)    Recommendations for Other Services       Precautions / Restrictions Precautions Precautions: Fall Restrictions Weight Bearing Restrictions: No    Mobility  Bed Mobility Overal bed mobility: Needs Assistance;+2 for physical assistance;+ 2 for safety/equipment Bed Mobility: Supine to Sit;Rolling Rolling: Total assist;+2 for physical assistance;+2 for safety/equipment   Supine to sit: Total assist;+2 for physical assistance;+2 for safety/equipment Sit to supine: Total assist;+2 for physical assistance;+2 for safety/equipment   General bed mobility comments: Pt required (A) to initiate neck rotation for rolling and reaching hand over hand with R hand and unable to sustain reach  Transfers Overall transfer level: Needs assistance   Transfers: Sit to/from Stand;Stand Pivot Transfers Sit to Stand: Total assist;+2 physical assistance Stand pivot transfers: Total assist;+2 physical assistance       General transfer comment: patient required total assist today  Ambulation/Gait                 Stairs            Wheelchair Mobility    Modified Rankin (Stroke Patients Only) Modified Rankin (Stroke Patients Only) Pre-Morbid Rankin Score: No symptoms Modified Rankin: Severe disability     Balance Overall balance assessment: Needs assistance Sitting-balance support: Feet supported Sitting balance-Leahy Scale: Zero Sitting balance - Comments: patient with poor ability to control balance required moderate to max assist for EOb sitting Postural control: Posterior lean;Right lateral lean Standing balance support: Bilateral upper extremity supported Standing balance-Leahy Scale: Zero                      Cognition Arousal/Alertness: Awake/alert Behavior During Therapy: Impulsive Overall Cognitive Status: Impaired/Different from baseline Area of Impairment: Orientation;Attention;Following commands;Safety/judgement;Awareness;Problem solving Orientation Level: Disoriented to;Place;Time;Situation;Person Current Attention Level: Focused Memory: Decreased recall of precautions;Decreased short-term memory Following Commands: Follows one step commands with increased time;Follows one step commands inconsistently Safety/Judgement: Decreased awareness of deficits;Decreased awareness of safety Awareness: Intellectual Problem Solving: Slow processing;Decreased initiation;Difficulty sequencing;Requires verbal cues;Requires tactile cues General Comments: complete left neglect noted today    Exercises      General Comments        Pertinent Vitals/Pain Pain Assessment: Faces Faces Pain Scale: No hurt    Home Living                      Prior Function            PT Goals (current goals can now be found in the care plan section) Acute Rehab PT Goals Patient Stated Goal: none stated PT Goal Formulation: Patient unable  to participate in goal setting Time For Goal Achievement: 07/09/16 Potential to Achieve Goals: Fair Progress towards PT  goals: Not progressing toward goals - comment (patient required increased assist today)    Frequency    Min 3X/week      PT Plan Current plan remains appropriate    Co-evaluation             End of Session   Activity Tolerance: Patient limited by fatigue Patient left: in chair;with call bell/phone within reach;with chair alarm set     Time: 1001-1020 PT Time Calculation (min) (ACUTE ONLY): 19 min  Charges:  $Therapeutic Activity: 8-22 mins                    G CodesFabio Asa 07-26-2016, 5:21 PM Charlotte Crumb, PT DPT  [phone]

## 2016-06-30 NOTE — Clinical Social Work Placement (Signed)
   CLINICAL SOCIAL WORK PLACEMENT  NOTE  Date:  06/30/2016  Patient Details  Name: Austin Sawyer MRN: [id number] Date of Birth: 11/14/1934  Clinical Social Work is seeking post-discharge placement for this patient at the Skilled  Nursing Facility level of care (*CSW will initial, date and re-position this form in  chart as items are completed):  Yes   Patient/family provided with [facility] Social Work Department's list of facilities offering this level of care within the geographic area requested by the patient (or if unable, by the patient's family).  Yes   Patient/family informed of their freedom to choose among providers that offer the needed level of care, that participate in Medicare, Medicaid or managed care program needed by the patient, have an available bed and are willing to accept the patient.      Patient/family informed of [facility]'s ownership interest in [facility] and [facility], as well as of the fact that they are under no obligation to receive care at these facilities.  PASRR submitted to EDS on 06/30/16     PASRR number received on       Existing PASRR number confirmed on       FL2 transmitted to all facilities in geographic area requested by pt/family on 06/30/16     FL2 transmitted to all facilities within larger geographic area on       Patient informed that his/her managed care company has contracts with or will negotiate with certain facilities, including the following:            Patient/family informed of bed offers received.  Patient chooses bed at       Physician recommends and patient chooses bed at      Patient to be transferred to   on  .  Patient to be transferred to facility by       Patient family notified on   of transfer.  Name of family member notified:        PHYSICIAN Please prepare priority discharge summary, including medications, Please prepare prescriptions, Please sign FL2     Additional Comment:     _______________________________________________ Venita Lickampbell, Bodie Abernethy B, LCSW 06/30/2016, 8:36 AM

## 2016-07-01 DIAGNOSIS — R1310 Dysphagia, unspecified: Secondary | ICD-10-CM

## 2016-07-01 DIAGNOSIS — F0151 Vascular dementia with behavioral disturbance: Secondary | ICD-10-CM

## 2016-07-01 LAB — GLUCOSE, CAPILLARY
GLUCOSE-CAPILLARY: 107 mg/dL — AB (ref 65–99)
GLUCOSE-CAPILLARY: 122 mg/dL — AB (ref 65–99)
GLUCOSE-CAPILLARY: 132 mg/dL — AB (ref 65–99)
GLUCOSE-CAPILLARY: 155 mg/dL — AB (ref 65–99)
Glucose-Capillary: 127 mg/dL — ABNORMAL HIGH (ref 65–99)
Glucose-Capillary: 128 mg/dL — ABNORMAL HIGH (ref 65–99)
Glucose-Capillary: 147 mg/dL — ABNORMAL HIGH (ref 65–99)

## 2016-07-01 LAB — BASIC METABOLIC PANEL
Anion gap: 5 (ref 5–15)
BUN: 22 mg/dL — AB (ref 6–20)
CALCIUM: 8.7 mg/dL — AB (ref 8.9–10.3)
CO2: 27 mmol/L (ref 22–32)
CREATININE: 0.98 mg/dL (ref 0.61–1.24)
Chloride: 111 mmol/L (ref 101–111)
GFR calc Af Amer: >60 mL/min (ref >60)
GLUCOSE: 143 mg/dL — AB (ref 65–99)
POTASSIUM: 3.5 mmol/L (ref 3.5–5.1)
SODIUM: 143 mmol/L (ref 135–145)

## 2016-07-01 LAB — CBC
HCT: 36.4 % — ABNORMAL LOW (ref 39.0–52.0)
Hemoglobin: 11.7 g/dL — ABNORMAL LOW (ref 13.0–17.0)
MCH: 29.3 pg (ref 26.0–34.0)
MCHC: 32.1 g/dL (ref 30.0–36.0)
MCV: 91 fL (ref 78.0–100.0)
PLATELETS: 205 10*3/uL (ref 150–400)
RBC: 4 MIL/uL — AB (ref 4.22–5.81)
RDW: 14.3 % (ref 11.5–15.5)
WBC: 12.2 10*3/uL — ABNORMAL HIGH (ref 4.0–10.5)

## 2016-07-01 MED ORDER — LISINOPRIL 20 MG PO TABS
20.0000 mg | ORAL_TABLET | Freq: Two times a day (BID) | ORAL | Status: DC
  Administered 2016-07-01 – 2016-07-06 (×9): 20 mg via ORAL
  Filled 2016-07-01 (×9): qty 1

## 2016-07-01 NOTE — Progress Notes (Signed)
Speech Language Pathology Treatment: Dysphagia  Patient Details Name: Candise CheLeon Burnett MRN: [id number] DOB: 01/16/1935 Today's Date: 07/01/2016 Time: [id number] SLP Time Calculation (min) (ACUTE ONLY): 15 min  Assessment / Plan / Recommendation Clinical Impression  Po trials provided at bedside to determine readiness for instrumental testing and/or a po diet. Patient lethargic however arousable with verbal and tactile cueing. Gaze deviated to the right with significant left sided neglect. Patient disoriented to place, poor awareness of situation verbally. With MAX tactile and verbal cueing, patient with eventual initiation of pharyngeal swallow with po trials (pureed solids, honey thick liquids) however following 4-5 bites/sips, patient returned to familiar pattern of poor awareness, poor sustained attention, and oral holding requiring oral suctioning. Noted today that in addition to cognitive deficits, wet vocal quality noted post swallow to suggest decreased airway protection. Although function slightly improved today, degree of cognitive impairments make it unlikely that patient will be able consistently and safely consume pos to support himself nutritionally. Patient will benefit from longer term non-oral means of nutrition. Prognosis for ability to resume pos in the future remains good with improved mentation.    HPI HPI: [name] an 81 y.o.malewith a flutter,history of stroke, cervicalgia, in remission from ETOH abuse, diabetes, dementia, intracranial hemorrhage, arachnoid hemorrhage back in 2016. Patient presents to the hospital with confusion. Head CT was obtained and showed a acute hemorrhage in the right frontal lobe. Per chart there is a midline shift that measures up to 4 mm. BSE 02/16/15 following stroke recommended NPO. Pt able to initiate Dys 1, thin 2 days later and upgraded to Dys 2 during that admission.      SLP Plan  Continue with current plan of care     Recommendations  Diet  recommendations: NPO Medication Administration: Via alternative means                Oral Care Recommendations: Oral care QID Follow up Recommendations: Skilled Nursing facility Plan: Continue with current plan of care       GO             [facility] Niyanna Asch MA, CCC-SLP [phone]    Ferdinand LangoMcCoy Brigida Scotti Meryl 07/01/2016, 4:08 PM

## 2016-07-01 NOTE — Progress Notes (Signed)
Writer called to get a telephone consent from spouse, but spouse she will like to speak to the doctors before she signs the consent.

## 2016-07-01 NOTE — Care Management Important Message (Signed)
Important Message  Patient Details  Name: Austin Sawyer MRN: [id number] Date of Birth: 05/13/1935   Medicare Important Message Given:  Yes    Austin Sawyer 07/01/2016, 3:14 PM

## 2016-07-01 NOTE — Consult Note (Signed)
Reason for Consult:PEG tube placement Referring Physician: J. Erlinda Hong  [name] is an 81 y.o. male.  HPI: Rashun is s/p CVA and has been unable to pass a swallow evaluation. We were asked to place a PEG tube for feeding. He is currently being fed through [place] tube.  Past Medical History:  Diagnosis Date  . Alcohol abuse, in remission   . Atrial flutter ([place])   . Back pain   . Cervicalgia   . Coronary artery disease   . Coronary atherosclerosis of unspecified type of vessel, native or graft   . Dementia   . Depression   . Diabetes mellitus without complication ([place])   . Disturbance of skin sensation   . Hypertension   . MI, old   . Reflux   . Stroke ([place])   . Unspecified hearing loss   . Unspecified sleep apnea     Past Surgical History:  Procedure Laterality Date  . HERNIA REPAIR    . ROTATOR CUFF REPAIR      Family History  Problem Relation Age of Onset  . Anxiety disorder Mother   . Cancer Mother     Social History:  reports that he has quit smoking. He has never used smokeless tobacco. He reports that he does not drink alcohol or use drugs.  Allergies: No Known Allergies  Medications: I have reviewed the patient's current medications.  Results for orders placed or performed during the hospital encounter of 06/24/16 (from the past 48 hour(s))  Glucose, capillary     Status: None   Collection Time: 06/29/16 12:20 PM  Result Value Ref Range   Glucose-Capillary 99 65 - 99 mg/dL   Comment 1 Notify RN    Comment 2 Document in Chart   Glucose, capillary     Status: Abnormal   Collection Time: 06/29/16  4:22 PM  Result Value Ref Range   Glucose-Capillary 100 (H) 65 - 99 mg/dL   Comment 1 Notify RN    Comment 2 Document in Chart   Glucose, capillary     Status: Abnormal   Collection Time: 06/29/16  7:49 PM  Result Value Ref Range   Glucose-Capillary 127 (H) 65 - 99 mg/dL  Glucose, capillary     Status: Abnormal   Collection Time: 06/29/16 11:16 PM  Result  Value Ref Range   Glucose-Capillary 133 (H) 65 - 99 mg/dL  Glucose, capillary     Status: Abnormal   Collection Time: 06/30/16  3:15 AM  Result Value Ref Range   Glucose-Capillary 118 (H) 65 - 99 mg/dL  CBC     Status: Abnormal   Collection Time: 06/30/16  3:16 AM  Result Value Ref Range   WBC 9.3 4.0 - 10.5 K/uL   RBC 4.17 (L) 4.22 - 5.81 MIL/uL   Hemoglobin 12.1 (L) 13.0 - 17.0 g/dL   HCT 37.6 (L) 39.0 - 52.0 %   MCV 90.2 78.0 - 100.0 fL   MCH 29.0 26.0 - 34.0 pg   MCHC 32.2 30.0 - 36.0 g/dL   RDW 14.3 11.5 - 15.5 %   Platelets 206 150 - 400 K/uL  Basic metabolic panel     Status: Abnormal   Collection Time: 06/30/16  3:16 AM  Result Value Ref Range   Sodium 144 135 - 145 mmol/L   Potassium 3.5 3.5 - 5.1 mmol/L   Chloride 110 101 - 111 mmol/L   CO2 24 22 - 32 mmol/L   Glucose, Bld 137 (H) 65 - 99  mg/dL   BUN 21 (H) 6 - 20 mg/dL   Creatinine, Ser 0.94 0.61 - 1.24 mg/dL   Calcium 8.8 (L) 8.9 - 10.3 mg/dL   GFR calc non Af Amer >60 >60 mL/min   GFR calc Af Amer >60 >60 mL/min    Comment: (NOTE) The eGFR has been calculated using the CKD EPI equation. This calculation has not been validated in all clinical situations. eGFR's persistently <60 mL/min signify possible Chronic Kidney Disease.    Anion gap 10 5 - 15  Glucose, capillary     Status: Abnormal   Collection Time: 06/30/16  7:46 AM  Result Value Ref Range   Glucose-Capillary 131 (H) 65 - 99 mg/dL   Comment 1 Notify RN    Comment 2 Document in Chart   Glucose, capillary     Status: Abnormal   Collection Time: 06/30/16 11:46 AM  Result Value Ref Range   Glucose-Capillary 125 (H) 65 - 99 mg/dL  Glucose, capillary     Status: Abnormal   Collection Time: 06/30/16  4:02 PM  Result Value Ref Range   Glucose-Capillary 110 (H) 65 - 99 mg/dL  Glucose, capillary     Status: Abnormal   Collection Time: 06/30/16 10:18 PM  Result Value Ref Range   Glucose-Capillary 141 (H) 65 - 99 mg/dL   Comment 1 Notify RN    Comment  2 Document in Chart   Glucose, capillary     Status: Abnormal   Collection Time: 07/01/16 12:21 AM  Result Value Ref Range   Glucose-Capillary 122 (H) 65 - 99 mg/dL   Comment 1 Notify RN    Comment 2 Document in Chart   CBC     Status: Abnormal   Collection Time: 07/01/16  2:24 AM  Result Value Ref Range   WBC 12.2 (H) 4.0 - 10.5 K/uL   RBC 4.00 (L) 4.22 - 5.81 MIL/uL   Hemoglobin 11.7 (L) 13.0 - 17.0 g/dL   HCT 36.4 (L) 39.0 - 52.0 %   MCV 91.0 78.0 - 100.0 fL   MCH 29.3 26.0 - 34.0 pg   MCHC 32.1 30.0 - 36.0 g/dL   RDW 14.3 11.5 - 15.5 %   Platelets 205 150 - 400 K/uL  Basic metabolic panel     Status: Abnormal   Collection Time: 07/01/16  2:24 AM  Result Value Ref Range   Sodium 143 135 - 145 mmol/L   Potassium 3.5 3.5 - 5.1 mmol/L   Chloride 111 101 - 111 mmol/L   CO2 27 22 - 32 mmol/L   Glucose, Bld 143 (H) 65 - 99 mg/dL   BUN 22 (H) 6 - 20 mg/dL   Creatinine, Ser 0.98 0.61 - 1.24 mg/dL   Calcium 8.7 (L) 8.9 - 10.3 mg/dL   GFR calc non Af Amer >60 >60 mL/min   GFR calc Af Amer >60 >60 mL/min    Comment: (NOTE) The eGFR has been calculated using the CKD EPI equation. This calculation has not been validated in all clinical situations. eGFR's persistently <60 mL/min signify possible Chronic Kidney Disease.    Anion gap 5 5 - 15  Glucose, capillary     Status: Abnormal   Collection Time: 07/01/16  3:45 AM  Result Value Ref Range   Glucose-Capillary 155 (H) 65 - 99 mg/dL   Comment 1 Notify RN    Comment 2 Document in Chart   Glucose, capillary     Status: Abnormal   Collection Time: 07/01/16  8:07 AM  Result Value Ref Range   Glucose-Capillary 107 (H) 65 - 99 mg/dL   Comment 1 Notify RN    Comment 2 Document in Chart     Ct Head Wo Contrast  Result Date: 06/30/2016 CLINICAL DATA:  Intra cerebral hemorrhage EXAM: CT HEAD WITHOUT CONTRAST TECHNIQUE: Contiguous axial images were obtained from the base of the skull through the vertex without intravenous contrast.  COMPARISON:  CT head 06/24/2016 FINDINGS: Brain: Right frontal hematoma unchanged in size measuring approximately 3.5 x 5.5 cm. Partial resorption of high density blood products since prior study. No evidence of new hemorrhage since the prior study. Mass-effect on the right frontal lobe with 4 mm midline shift is unchanged. There is low-density edema surrounding the hematoma. Chronic left occipital infarct unchanged No new area of infarction or hemorrhage since the  prior study. Vascular: No hyperdense vessel or unexpected calcification. Skull: Negative Sinuses/Orbits: Negative Other: None IMPRESSION: Large right frontal hematoma unchanged. 4 mm of midline shift unchanged. No new findings. Electronically Signed   By: Franchot Gallo M.D.   On: 06/30/2016 13:25   Dg Abd Portable 1v  Result Date: 06/29/2016 CLINICAL DATA:  Feeding tube placement EXAM: PORTABLE ABDOMEN - 1 VIEW COMPARISON:  None. FINDINGS: There is a feeding tube with the tip projecting over the duodenum. There is no bowel dilatation to suggest obstruction. There is no evidence of pneumoperitoneum, portal venous gas or pneumatosis. There are no pathologic calcifications along the expected course of the ureters. The osseous structures are unremarkable. IMPRESSION: Feeding tube with the tip projecting over the duodenum. Electronically Signed   By: Kathreen Devoid   On: 06/29/2016 18:00    Review of Systems  Constitutional: Negative for weight loss.  HENT: Negative for ear discharge, ear pain, hearing loss and tinnitus.   Eyes: Negative for blurred vision, double vision, photophobia and pain.  Respiratory: Negative for cough, sputum production and shortness of breath.   Cardiovascular: Negative for chest pain.  Gastrointestinal: Negative for abdominal pain, nausea and vomiting.  Genitourinary: Negative for dysuria, flank pain, frequency and urgency.  Musculoskeletal: Negative for back pain, falls, joint pain, myalgias and neck pain.   Neurological: Negative for dizziness, tingling, sensory change, focal weakness, loss of consciousness and headaches.  Endo/Heme/Allergies: Does not bruise/bleed easily.  Psychiatric/Behavioral: Negative for depression, memory loss and substance abuse. The patient is not nervous/anxious.    Blood pressure (!) 147/65, pulse 71, temperature 98.9 F (37.2 C), temperature source Oral, resp. rate 20, height '5\' 9"'  (1.753 m), weight 93.3 kg (205 lb 11 oz), SpO2 97 %. Physical Exam  Vitals reviewed. Constitutional: He appears well-developed and well-nourished. He is cooperative. No distress. Nasal cannula in place.  HENT:  Head: Normocephalic and atraumatic. Head is without raccoon's eyes, without Battle's sign, without abrasion, without contusion and without laceration.  Right Ear: Hearing, tympanic membrane and ear canal normal. No lacerations. No drainage or tenderness. No foreign bodies. Tympanic membrane is not perforated. No hemotympanum.  Left Ear: Hearing, tympanic membrane and ear canal normal. No lacerations. No drainage or tenderness. No foreign bodies. Tympanic membrane is not perforated. No hemotympanum.  Nose: No nose lacerations, sinus tenderness, nasal deformity or nasal septal hematoma. No epistaxis.  Mouth/Throat: Uvula is midline and mucous membranes are normal. No lacerations.  Eyes: Conjunctivae and lids are normal. Right eye exhibits no discharge. Left eye exhibits no discharge. No scleral icterus.  Neck: Trachea normal. No spinous process tenderness and no muscular tenderness present. Carotid bruit  is not present.  Cardiovascular: Normal rate, regular rhythm, normal heart sounds, intact distal pulses and normal pulses.  Exam reveals no gallop and no friction rub.   No murmur heard. Respiratory: Effort normal and breath sounds normal. No respiratory distress. He has no wheezes. He has no rales. He exhibits no tenderness, no bony tenderness, no laceration and no crepitus.  GI: Soft.  Normal appearance and bowel sounds are normal. He exhibits no distension. There is no tenderness. There is no rigidity, no rebound, no guarding and no CVA tenderness.  Genitourinary: Penis normal.  Musculoskeletal: Normal range of motion. He exhibits no edema or tenderness.  Lymphadenopathy:    He has no cervical adenopathy.  Neurological: He is alert. He has normal strength. No sensory deficit. GCS eye subscore is 4. GCS verbal subscore is 5. GCS motor subscore is 6.  Skin: Skin is warm, dry and intact. He is not diaphoretic.  Psychiatric: He has a normal mood and affect. His speech is normal and behavior is normal.    Assessment/Plan: Dysphagia -- I explained procedure, risk/benefit, and reasoning to pt. He declined to make a decision to proceed with me, wanted to think about it. I offered to call wife and he said that was not necessary. We will check back later. If he refuses he may need a competency evaluation.  Thank you for the consult.    Lisette Abu, PA-C Pager: [phone] General Trauma PA Pager: [phone] 07/01/2016, 9:11 AM

## 2016-07-01 NOTE — Progress Notes (Signed)
STROKE TEAM PROGRESS NOTE   SUBJECTIVE (INTERVAL HISTORY) His wife and his wife's sister are at the bedside. Discussed with wife about plan of care and PEG placement. Wife in agreement. Speech follow up considered dysphagia mostly due to cognitive impairment and agree that pt likely needs PEG in short term.    OBJECTIVE Temp:  [98.7 F (37.1 C)-99.6 F (37.6 C)] 99.5 F (37.5 C) (09/28 1023) Pulse Rate:  [66-79] 75 (09/28 1023) Cardiac Rhythm: Normal sinus rhythm (09/28 0700) Resp:  [18-24] 18 (09/28 1023) BP: (114-162)/(58-98) 138/61 (09/28 1023) SpO2:  [96 %-99 %] 96 % (09/28 1023) Weight:  [93.3 kg (205 lb 11 oz)] 93.3 kg (205 lb 11 oz) (09/28 0603)  CBC:   Recent Labs Lab 06/24/16 1352  06/30/16 0316 07/01/16 0224  WBC 13.1*  < > 9.3 12.2*  NEUTROABS 10.6*  --   --   --   HGB 13.9  < > 12.1* 11.7*  HCT 42.4  < > 37.6* 36.4*  MCV 90.2  < > 90.2 91.0  PLT 202  < > 206 205  < > = values in this interval not displayed.  Basic Metabolic Panel:   Recent Labs Lab 06/30/16 0316 07/01/16 0224  NA 144 143  K 3.5 3.5  CL 110 111  CO2 24 27  GLUCOSE 137* 143*  BUN 21* 22*  CREATININE 0.94 0.98  CALCIUM 8.8* 8.7*    Lipid Panel:     Component Value Date/Time   CHOL 216 (H) 06/28/2016 0347   TRIG 114 06/28/2016 0347   HDL 35 (L) 06/28/2016 0347   CHOLHDL 6.2 06/28/2016 0347   VLDL 23 06/28/2016 0347   LDLCALC 158 (H) 06/28/2016 0347   HgbA1c:  Lab Results  Component Value Date   HGBA1C 6.0 (H) 02/09/2015   Urine Drug Screen:     Component Value Date/Time   LABOPIA NONE DETECTED 02/08/2015 1823   COCAINSCRNUR NONE DETECTED 02/08/2015 1823   LABBENZ NONE DETECTED 02/08/2015 1823   AMPHETMU NONE DETECTED 02/08/2015 1823   THCU NONE DETECTED 02/08/2015 1823   LABBARB NONE DETECTED 02/08/2015 1823      IMAGING I have personally reviewed the radiological images below and agree with the radiology interpretations.  Ct Head Wo Contrast 06/24/2016 1. ~32 cc  right frontal acute parenchymal hematoma. Mild local subarachnoid extension. 4 mm midline shift.  2. Gliosis from remote left occipital lobar hemorrhage in 2016. Possible amyloid angiopathy, but no typical chronic hemorrhagic foci on 2016 brain MRI. Question hemorrhagic conversion of infarct.   06/30/2016 IMPRESSION: Large right frontal hematoma unchanged. 4 mm of midline shift unchanged. No new findings.   Mr Brain Wo Contrast  06/26/2016 Moderate to severely motion degraded examination. Acute RIGHT frontal lobe 2.8 x 5.9 x 3.7 cm hematoma, resulting in mild RIGHT to LEFT midline shift. Old LEFT occipital lobe hemorrhagic infarct.  EEG 06/25/2016 IMPRESSION: This is an abnormal EEG demonstrating a mild diffuse slowing of electrocerebral activity.  This can be seen in a wide variety of encephalopathic state including those of a toxic, metabolic, or degenerative nature.  There were no focal, hemispheric, or lateralizing features.  No epileptiform activity was recorded.  TTE  - Left ventricle: The cavity size was normal. There was moderate concentric hypertrophy. Systolic function was normal. The estimated ejection fraction was in the range of 60% to 65%. Diffuse hypokinesis. Doppler parameters are consistent with abnormal left ventricular relaxation (grade 1 diastolic dysfunction). Doppler parameters are consistent with elevated ventricular end-diastolic  filling pressure. - Ventricular septum: Septal motion showed paradox. - Aortic valve: There was mild stenosis. Valve area (VTI): 2.48 cm^2. Valve area (Vmax): 2.06 cm^2. Valve area (Vmean): 2.2 cm^2. - Aortic root: The aortic root was normal in size. - Mitral valve: Structurally normal valve. - Right ventricle: The cavity size was normal. Wall thickness was normal. Systolic function was normal. - Tricuspid valve: There was no regurgitation. - Pulmonary arteries: Systolic pressure could not be accurately estimated. - Inferior vena cava: The vessel  was normal in size. - Pericardium, extracardiac: There was no pericardial effusion. Impressions: - Poor quality study with limited endocardial visualization, however there appears to be normal biventricular function.  PHYSICAL EXAM  Temp:  [98.7 F (37.1 C)-99.6 F (37.6 C)] 99.5 F (37.5 C) (09/28 1023) Pulse Rate:  [66-79] 75 (09/28 1023) Resp:  [18-24] 18 (09/28 1023) BP: (114-162)/(58-98) 138/61 (09/28 1023) SpO2:  [96 %-99 %] 96 % (09/28 1023) Weight:  [93.3 kg (205 lb 11 oz)] 93.3 kg (205 lb 11 oz) (09/28 0603)  General - Well nourished, well developed, in no apparent distress.  Ophthalmologic - Fundi not visualized due to noncooperation.  Cardiovascular - Regular rate and rhythm.  Mental Status -  Level of arousal and orientation to self, but not orientated to place, time or people. Language exam showed paucity of speech, able to follow simple commands after several requests, not able to name but able to repeat simple sentences. Left neglect.  Cranial Nerves II - XII - II - Visual field exam showed left visual field neglect. III, IV, VI - right gaze preference. V - Facial sensation intact bilaterally. VII - left facial droop. VIII - Hearing & vestibular intact bilaterally. X - Palate elevates symmetrically. XI - Chin turning & shoulder shrug intact bilaterally. XII - Tongue protrusion to the left.  Motor Strength - The patient's strength was 5/5 RUE, LUE 2/5 with pain stimulation, RLE 5/5 and LLE 4/5.  Bulk was normal and fasciculations were absent.   Motor Tone - Muscle tone was assessed at the neck and appendages and was normal.  Reflexes - The patient's reflexes were 1+ in all extremities and he had bilateral babinski reflexes.  Sensory - Light touch, temperature/pinprick were assessed and were symmetrical.    Coordination - no cooperative on test.  Tremor was absent.  Gait and Station - not tested due to safety concerns   ASSESSMENT/PLAN [name] is  a 81 y.o. male with history of HTN, diabetes, CAD, dementia, cured atrial flutter not on [facility], alcohol abuse, previous lobar ICH presenting with confusion, "problems" getting around x 1 week. CT showed a R frontal ICH with midline shift. Noncompliant with medications, not taking his HTN medications.   Stroke:  right frontal ICH with 4mm midline shift in setting of hypertension, etiology could be due to hypertensive due to noncompliance or CAA given recurrent lobar ICHs  Resultant HA, baseline confusion  CT - right frontal ICH  MRI - Acute RIGHT frontal lobe 2.8 x 5.9 x 3.7 cm hematoma, stable size  Repeat CT - stable hematoma and increased edema  EEG - mild general slowing, no seizure - d/c keppra  2D echo - EF 60-65%  Heparin subq for VTE prophylaxis  aspirin 81 mg daily prior to admission, now on No antithrombotic given hemorrhage. Due to concern of CAA etiology, will not recommend antiplatelet in the future.  Ongoing aggressive stroke risk factor management  Therapy recommendations: SNF  Disposition:  Pending  History  of lobar ICH  02/2015 left parietal occipital ICH/IVH/SAH/SDH  BP high and controlled with 4 BP meds at discharge  Not compliant with BP meds  Considered hypertensive ICH and put on ASA 81 later  Resultant significant language deficit and cognitive impairment  Atrial Fibrillation  Home anticoagulation:  none   On coumadin until 2011 then off. Dr. [place] LionsHochrien considered his Aflutter cured  Hypertension  On coreg, catapres, hydralazine, HCTZ, lisinopril prior to admission - not taking per daughter was noncompliant  Off nicardipine for BP control  Currently BP stable  Resumed coreg, hydralazine and lisinopril for BP control, increased lisinopril to 20mg  bid  SBP goal < 160  Long-term BP goal normotensive  Hyperlipidemia  LDL 158, goal < 70  Put on lipitor 20mg   Continue statin at discharge  Diabetes type II  On glucophage at home  HbA1c  6.0  Resume glucophage  Vascular dementia  Baseline dementia  On seroquel and remoron at home  Resume seroquel at bedtime for sundowning.  Dysphagia  Secondary to stroke  Has panda and TF  Trauma consulted for PEG placement, wife agreeable  ST saw him again today and recommend ongoing NPO  Other Stroke Risk Factors  Advanced age  Obesity, Body mass index is 30.38 kg/m., recommend weight loss, diet and exercise as appropriate   Coronary artery disease - MI  Noncompliant with medications and medical management  Marvel PlanJindong Gurnoor Sloop, MD PhD Stroke Neurology 07/01/2016 10:36 PM    To contact Stroke Continuity provider, please refer to [email or web address]. After hours, contact General Neurology

## 2016-07-02 ENCOUNTER — Encounter (HOSPITAL_COMMUNITY)
Admission: EM | Disposition: A | Discharge status: Skilled Nursing Facility | Payer: Self-pay | Source: Home / Self Care | Attending: Neurology

## 2016-07-02 ENCOUNTER — Inpatient Hospital Stay (HOSPITAL_COMMUNITY): Payer: Medicare Other | Admitting: Certified Registered Nurse Anesthetist

## 2016-07-02 ENCOUNTER — Encounter (HOSPITAL_COMMUNITY): Payer: Self-pay

## 2016-07-02 DIAGNOSIS — M10061 Idiopathic gout, right knee: Secondary | ICD-10-CM

## 2016-07-02 DIAGNOSIS — I680 Cerebral amyloid angiopathy: Secondary | ICD-10-CM

## 2016-07-02 DIAGNOSIS — Z931 Gastrostomy status: Secondary | ICD-10-CM

## 2016-07-02 HISTORY — PX: PEG PLACEMENT: SHX5437

## 2016-07-02 LAB — GLUCOSE, CAPILLARY
GLUCOSE-CAPILLARY: 115 mg/dL — AB (ref 65–99)
GLUCOSE-CAPILLARY: 118 mg/dL — AB (ref 65–99)
GLUCOSE-CAPILLARY: 102 mg/dL — AB (ref 65–99)
GLUCOSE-CAPILLARY: 146 mg/dL — AB (ref 65–99)
Glucose-Capillary: 91 mg/dL (ref 65–99)

## 2016-07-02 LAB — SURGICAL PCR SCREEN
MRSA, PCR: NEGATIVE
STAPHYLOCOCCUS AUREUS: NEGATIVE

## 2016-07-02 SURGERY — INSERTION, PEG TUBE
Anesthesia: Monitor Anesthesia Care

## 2016-07-02 MED ORDER — LACTATED RINGERS IV SOLN
INTRAVENOUS | Status: DC
  Administered 2016-07-02 (×2): via INTRAVENOUS

## 2016-07-02 MED ORDER — CEFAZOLIN (ANCEF) 1 G IV SOLR
2.0000 g | INTRAVENOUS | Status: AC
  Administered 2016-07-02: 2 g

## 2016-07-02 MED ORDER — JEVITY 1.2 CAL PO LIQD: 1000.0000 mL | ORAL | 0 refills | Status: DC

## 2016-07-02 MED ORDER — SENNOSIDES-DOCUSATE SODIUM 8.6-50 MG PO TABS: 1.0000 | ORAL_TABLET | Freq: Two times a day (BID) | ORAL | Status: DC

## 2016-07-02 MED ORDER — JEVITY 1.2 CAL PO LIQD
1000.0000 mL | ORAL | Status: DC
  Administered 2016-07-02 – 2016-07-06 (×4): 1000 mL
  Filled 2016-07-02 (×9): qty 1000

## 2016-07-02 MED ORDER — PROPOFOL 500 MG/50ML IV EMUL
INTRAVENOUS | Status: DC | PRN
  Administered 2016-07-02: 50 ug/kg/min via INTRAVENOUS

## 2016-07-02 MED ORDER — PROPOFOL 10 MG/ML IV BOLUS
INTRAVENOUS | Status: DC | PRN
  Administered 2016-07-02: 20 mg via INTRAVENOUS

## 2016-07-02 MED ORDER — PRO-STAT SUGAR FREE PO LIQD: 30.0000 mL | Freq: Two times a day (BID) | ORAL | 0 refills | Status: DC

## 2016-07-02 MED ORDER — COLCHICINE 0.6 MG PO TABS: 0.6000 mg | ORAL_TABLET | Freq: Two times a day (BID) | ORAL | Status: DC

## 2016-07-02 MED ORDER — CEFAZOLIN SODIUM-DEXTROSE 2-4 GM/100ML-% IV SOLN
INTRAVENOUS | Status: AC
Start: 2016-07-02 — End: 2016-07-02
  Filled 2016-07-02: qty 100

## 2016-07-02 MED ORDER — COLCHICINE 0.6 MG PO TABS
0.6000 mg | ORAL_TABLET | Freq: Once | ORAL | Status: AC
  Administered 2016-07-02: 0.6 mg via ORAL
  Filled 2016-07-02: qty 1

## 2016-07-02 MED ORDER — GABAPENTIN 250 MG/5ML PO SOLN: 300.0000 mg | Freq: Every day | ORAL | 12 refills | Status: DC

## 2016-07-02 MED ORDER — ALLOPURINOL 100 MG PO TABS
300.0000 mg | ORAL_TABLET | Freq: Every day | ORAL | Status: DC
  Administered 2016-07-02 – 2016-07-06 (×5): 300 mg via ORAL
  Filled 2016-07-02 (×6): qty 3

## 2016-07-02 MED ORDER — ATORVASTATIN CALCIUM 20 MG PO TABS: 20.0000 mg | ORAL_TABLET | Freq: Every day | ORAL | Status: DC

## 2016-07-02 MED ORDER — COLCHICINE 0.6 MG PO TABS
1.2000 mg | ORAL_TABLET | Freq: Once | ORAL | Status: AC
  Administered 2016-07-02: 1.2 mg via ORAL
  Filled 2016-07-02: qty 2

## 2016-07-02 NOTE — Transfer of Care (Signed)
Immediate Anesthesia Transfer of Care Note  Patient: Austin Sawyer  Procedure(s) Performed: Procedure(s): PERCUTANEOUS ENDOSCOPIC GASTROSTOMY (PEG) PLACEMENT (N/A)  Patient Location: Endoscopy Unit  Anesthesia Type:MAC  Level of Consciousness: awake, alert  and patient cooperative  Airway & Oxygen Therapy: Patient Spontanous Breathing and Patient connected to nasal cannula oxygen  Post-op Assessment: Report given to RN, Post -op Vital signs reviewed and stable and Patient moving all extremities X 4  Post vital signs: Reviewed and stable  Last Vitals:  Vitals:   07/02/16 1040 07/02/16 1202  BP: (!) 189/88 (!) 190/84  Pulse: 71 69  Resp: 20 20  Temp:  37.1 C    Last Pain:  Vitals:   07/02/16 1202  TempSrc: Oral  PainSc:       Patients Stated Pain Goal: 0 (06/25/16 1000)  Complications: No apparent anesthesia complications

## 2016-07-02 NOTE — Discharge Summary (Signed)
Stroke Discharge Summary  Patient ID: Austin Sawyer   MRN: [id number]      DOB: 01/05/1935  Date of Admission: 06/24/2016 Date of Discharge: 07/06/2016  Attending Physician:  Marvel Plan, MD, Stroke MD Consulting Physician(s):   Dr. Jimmye Norman (trauma, for PEG placement) Patient's PCP:  No PCP Per Patient  DISCHARGE DIAGNOSIS:  1. Right frontal ICH, etiology hypertensive vs CAA  2. Vascular dementia 3. History of lobar ICH 4. History of transient atrial Fibrillation 5. Hypertensive Emergency 6. Hyperlipidemia 7. Diabetes type II 8. Dysphagia, Secondary to stroke, s/p PEG placement 9. R knee gout 10. Obesity 11. Noncompliant with medications and medical management   Past Medical History:  Diagnosis Date  . Alcohol abuse, in remission   . Atrial flutter (HCC)   . Back pain   . Cervicalgia   . Coronary artery disease   . Coronary atherosclerosis of unspecified type of vessel, native or graft   . Dementia   . Depression   . Diabetes mellitus without complication (HCC)   . Disturbance of skin sensation   . Hypertension   . MI, old   . Reflux   . Stroke (HCC)   . Unspecified hearing loss   . Unspecified sleep apnea    Past Surgical History:  Procedure Laterality Date  . HERNIA REPAIR    . PEG PLACEMENT N/A 07/02/2016   Procedure: PERCUTANEOUS ENDOSCOPIC GASTROSTOMY (PEG) PLACEMENT;  Surgeon: Violeta Gelinas, MD;  Location: [facility] ENDOSCOPY;  Service: Endoscopy;  Laterality: N/A;  . ROTATOR CUFF REPAIR        Medication List    STOP taking these medications   acetaminophen 325 MG tablet Commonly known as:  TYLENOL   amLODipine 5 MG tablet Commonly known as:  NORVASC   cloNIDine 0.1 MG tablet Commonly known as:  CATAPRES   gabapentin 300 MG capsule Commonly known as:  NEURONTIN Replaced by:  gabapentin 250 MG/5ML solution   hydrochlorothiazide 25 MG tablet Commonly known as:  HYDRODIURIL   HYDROcodone-acetaminophen 5-325 MG tablet Commonly known as:   NORCO/VICODIN   mirtazapine 15 MG tablet Commonly known as:  REMERON   multivitamin with minerals Tabs tablet   OVER THE COUNTER MEDICATION     TAKE these medications   albuterol (2.5 MG/3ML) 0.083% nebulizer solution Commonly known as:  PROVENTIL Take 3 mLs (2.5 mg total) by nebulization 2 (two) times daily as needed for wheezing or shortness of breath.   allopurinol 300 MG tablet Commonly known as:  ZYLOPRIM Take 300 mg by mouth daily. For gout   atorvastatin 20 MG tablet Commonly known as:  LIPITOR Take 1 tablet (20 mg total) by mouth daily at 6 PM.   carvedilol 6.25 MG tablet Commonly known as:  COREG Take 1 tablet (6.25 mg total) by mouth 2 (two) times daily with a meal. What changed:  medication strength  how much to take   DULoxetine HCl 40 MG Cpep Take 40 mg by mouth daily. What changed:  how much to take   feeding supplement (JEVITY 1.2 CAL) Liqd Place 1,000 mLs into feeding tube continuous.   feeding supplement (PRO-STAT SUGAR FREE 64) Liqd Place 30 mLs into feeding tube 2 (two) times daily.   gabapentin 250 MG/5ML solution Commonly known as:  NEURONTIN Place 6 mLs (300 mg total) into feeding tube daily. Replaces:  gabapentin 300 MG capsule   hydrALAZINE 50 MG tablet Commonly known as:  APRESOLINE Take 1 tablet (50 mg total) by mouth every  8 (eight) hours. What changed:  when to take this   lisinopril 20 MG tablet Commonly known as:  PRINIVIL,ZESTRIL Take 1 tablet (20 mg total) by mouth 2 (two) times daily. What changed:  when to take this   metFORMIN 500 MG tablet Commonly known as:  GLUCOPHAGE Take 500 mg by mouth daily.   nitroGLYCERIN 0.4 MG SL tablet Commonly known as:  NITROSTAT Place 0.4 mg under the tongue every 5 (five) minutes as needed. Chest pain   QUEtiapine 25 MG tablet Commonly known as:  SEROQUEL Take 1 tablet (25 mg total) by mouth at bedtime.   senna-docusate 8.6-50 MG tablet Commonly known as:  Senokot-S Take 1 tablet  by mouth 2 (two) times daily.   tamsulosin 0.4 MG Caps capsule Commonly known as:  FLOMAX Take 0.4 mg by mouth daily.       LABORATORY STUDIES CBC    Component Value Date/Time   WBC 11.6 (H) 07/05/2016 0633   RBC 4.03 (L) 07/05/2016 0633   HGB 11.6 (L) 07/05/2016 0633   HCT 36.6 (L) 07/05/2016 0633   PLT 203 07/05/2016 0633   MCV 90.8 07/05/2016 0633   MCH 28.8 07/05/2016 0633   MCHC 31.7 07/05/2016 0633   RDW 14.2 07/05/2016 0633   LYMPHSABS 1.4 06/24/2016 1352   MONOABS 0.8 06/24/2016 1352   EOSABS 0.2 06/24/2016 1352   BASOSABS 0.0 06/24/2016 1352   CMP    Component Value Date/Time   NA 143 07/05/2016 0633   K 3.9 07/05/2016 0633   CL 107 07/05/2016 0633   CO2 29 07/05/2016 0633   GLUCOSE 139 (H) 07/05/2016 0633   BUN 23 (H) 07/05/2016 0633   CREATININE 0.93 07/05/2016 0633   CALCIUM 8.9 07/05/2016 0633   PROT 8.1 06/24/2016 1352   ALBUMIN 4.2 06/24/2016 1352   AST 19 06/24/2016 1352   ALT 17 06/24/2016 1352   ALKPHOS 64 06/24/2016 1352   BILITOT 0.7 06/24/2016 1352   GFRNONAA >60 07/05/2016 0633   GFRAA >60 07/05/2016 0633   COAGS Lab Results  Component Value Date   INR 1.06 06/24/2016   INR 1.08 01/09/2016   INR 1.07 02/08/2015   Lipid Panel    Component Value Date/Time   CHOL 216 (H) 06/28/2016 0347   TRIG 114 06/28/2016 0347   HDL 35 (L) 06/28/2016 0347   CHOLHDL 6.2 06/28/2016 0347   VLDL 23 06/28/2016 0347   LDLCALC 158 (H) 06/28/2016 0347   HgbA1C  Lab Results  Component Value Date   HGBA1C 6.0 (H) 02/09/2015   Cardiac Panel (last 3 results) No results for input(s): CKTOTAL, CKMB, TROPONINI, RELINDX in the last 72 hours. Urinalysis    Component Value Date/Time   COLORURINE YELLOW 03/13/2016 1853   APPEARANCEUR CLEAR 03/13/2016 1853   LABSPEC 1.012 03/13/2016 1853   PHURINE 7.5 03/13/2016 1853   GLUCOSEU NEGATIVE 03/13/2016 1853   HGBUR NEGATIVE 03/13/2016 1853   BILIRUBINUR NEGATIVE 03/13/2016 1853   KETONESUR NEGATIVE  03/13/2016 1853   PROTEINUR NEGATIVE 03/13/2016 1853   UROBILINOGEN 0.2 06/03/2015 1240   NITRITE NEGATIVE 03/13/2016 1853   LEUKOCYTESUR NEGATIVE 03/13/2016 1853   Urine Drug Screen     Component Value Date/Time   LABOPIA NONE DETECTED 02/08/2015 1823   COCAINSCRNUR NONE DETECTED 02/08/2015 1823   LABBENZ NONE DETECTED 02/08/2015 1823   AMPHETMU NONE DETECTED 02/08/2015 1823   THCU NONE DETECTED 02/08/2015 1823   LABBARB NONE DETECTED 02/08/2015 1823    Alcohol Level    Component Value  Date/Time   ETH <5 02/08/2015 1826     SIGNIFICANT DIAGNOSTIC STUDIES I have personally reviewed the radiological images below and agree with the radiology interpretations.  Ct Head Wo Contrast 06/24/2016 1. ~32 cc right frontal acute parenchymal hematoma. Mild local subarachnoid extension. 4 mm midline shift.  2. Gliosis from remote left occipital lobar hemorrhage in 2016. Possible amyloid angiopathy, but no typical chronic hemorrhagic foci on 2016 brain MRI. Question hemorrhagic conversion of infarct.   06/30/2016 IMPRESSION: Large right frontal hematoma unchanged. 4 mm of midline shift unchanged. No new findings.   Mr Brain Wo Contrast  06/26/2016 Moderate to severely motion degraded examination. Acute RIGHT frontal lobe 2.8 x 5.9 x 3.7 cm hematoma, resulting in mild RIGHT to LEFT midline shift. Old LEFT occipital lobe hemorrhagic infarct.  EEG 06/25/2016 This is an abnormal EEG demonstrating a mild diffuse slowing of electrocerebral activity. This can be seen in a wide variety of encephalopathic state including those of a toxic, metabolic, or degenerative nature. There were no focal, hemispheric, or lateralizing features. No epileptiform activity was recorded.  TTE  - Left ventricle: The cavity size was normal. There was moderateconcentric hypertrophy. Systolic function was normal. Theestimated ejection fraction was in the range of 60% to 65%.Diffuse hypokinesis. Doppler parameters  are consistent withabnormal left ventricular relaxation (grade 1 diastolicdysfunction). Doppler parameters are consistent with elevatedventricular end-diastolic filling pressure. - Ventricular septum: Septal motion showed paradox. - Aortic valve: There was mild stenosis. Valve area (VTI): 2.48cm^2. Valve area (Vmax): 2.06 cm^2. Valve area (Vmean): 2.2 cm^2. - Aortic root: The aortic root was normal in size. - Mitral valve: Structurally normal valve. - Right ventricle: The cavity size was normal. Wall thickness wasnormal. Systolic function was normal. - Tricuspid valve: There was no regurgitation. - Pulmonary arteries: Systolic pressure could not be accuratelyestimated. - Inferior vena cava: The vessel was normal in size. - Pericardium, extracardiac: There was no pericardial effusion. Impressions: - Poor quality study with limited endocardial visualization,however there appears to be normal biventricular function.     HISTORY OF PRESENT ILLNESS Lissandro Dilorenzo an 81 y.o.malewith aflutter,history of stroke, diabetes, dementia, currently on no anticoagulation secondary to previous intracranial hemorrhage and subarachnoid hemorrhage back in 2016. Patient presents to the hospital with confusion. Head CT was obtained and showed a acute hemorrhage in the right frontal lobe. Overall dimensions are 60 x 30 x 36 mm. There is a midline shift that measures up to 4 mm. There is no intraventricular extension. Last known well is unable to be determined. Patient was not administered IV t-PA secondary to ICH. He was admitted to the neuro ICU for further evaluation and treatment.    HOSPITAL COURSE Mr. Matthewjames Petrasek is a 81 y.o. male with history of HTN, diabetes, CAD, dementia, cured atrial flutter not on [facility], alcohol abuse, previous lobar ICH presenting with confusion, "problems" getting around x 1 week. CT showed a R frontal ICH with midline shift. Noncompliant with medications, not taking his HTN  medications. His BP was controlled with cardene initially and then transition to oral BP meds. Did not pass swallow and had PEG placement for nutrition. Had right knee pain, consider gout flare and treated with colchicine and then resume allopurinol. PT/OT evaluated for SNF placement. Pt continues to have vascular dementia and cognitive impairment. Resumed seroquel for sundowning. Pt stable for SNF discharge today.   Stroke:  right frontal ICH with 4mm midline shift in setting of hypertension, etiology could be due to hypertensive due to noncompliance or  CAA given recurrent lobar ICHs  Resultant HA, baseline confusion  CT - right frontal ICH  MRI - Acute RIGHT frontal lobe 2.8 x 5.9 x 3.7 cm hematoma, stable size  Repeat CT - stable hematoma and increased edema  EEG - mild general slowing, no seizure - d/c keppra  2D echo - EF 60-65%  aspirin 81 mg daily prior to admission, now on No antithrombotic given hemorrhage. Due to concern of CAA etiology, will not recommend antiplatelet in the future.  Ongoing aggressive stroke risk factor management  Therapy recommendations: SNF  Disposition:  SNF   History of lobar ICH  02/2015 left parietal occipital ICH/IVH/SAH/SDH  BP high and controlled with 4 BP meds at discharge  Not compliant with BP meds  Considered hypertensive ICH and put on ASA 81 later at that time  Resultant significant language deficit and cognitive impairment  Atrial Fibrillation  Home anticoagulation:  none   On coumadin until 2011 then off. Dr. Newell LionsHochrien considered his Aflutter cured  No anticoagulation given recurrent ICH  Hypertensive Emergency  On coreg, catapres, hydralazine, HCTZ, lisinopril prior to admission - not taking per daughter was noncompliant  Treated with nicardipine for BP control initially  Currently BP stable  Resumed coreg, hydralazine and lisinopril for BP control, increased lisinopril to 20mg  bid  Long-term BP goal  normotensive  Hyperlipidemia  LDL 158, goal < 70  Put on lipitor 20mg   Continue statin at discharge  Diabetes type II  On glucophage at home  HbA1c 6.0  Resumed glucophage  Vascular dementia  Baseline dementia  On seroquel and remoron at home  Resume seroquel at bedtime for sundowning. No remeron  Dysphagia  Secondary to stroke  Has panda and TF  Trauma placed PEG 07/02/2016.   Resume TF, tolerate well  R knee pain  Hx gout  R knee with edema, pain to touch, started 07/02/2016  Start colchicine once able to use PEG - 1.2 mg x 1 followed by 0.6 mg 1 hour later.   Resume allopurinol, home dose, 07/03/2016  Other Stroke Risk Factors  Advanced age  Obesity, Body mass index is 30.18 kg/m., recommend weight loss, diet and exercise as appropriate   Coronary artery disease - MI  Noncompliant with medications and medical management   DISCHARGE EXAM Blood pressure (!) 172/80, pulse 79, temperature 98.3 F (36.8 C), temperature source Oral, resp. rate 18, height 5\' 9"  (1.753 m), weight 207 lb 10.8 oz (94.2 kg), SpO2 98 %. General - Well nourished, well developed, in no apparent distress.  Ophthalmologic - Fundi not visualized due to noncooperation.  Cardiovascular - Regular rate and rhythm.  Mental Status -  Level of arousal and orientation to self, but not orientated to place, time or people. Language exam showed paucity of speech, able to follow simple commands after several requests, not able to name but able to repeat simple sentences. Left neglect.  Cranial Nerves II - XII - II - Visual field exam showed left visual field neglect. III, IV, VI - right gaze preference. V - Facial sensation intact bilaterally. VII - left facial droop. VIII - Hearing & vestibular intact bilaterally. X - Palate elevates symmetrically. XI - Chin turning & shoulder shrug intact bilaterally. XII - Tongue protrusion to the left.  Motor Strength - The  patient's strength was 5/5 RUE, LUE 2/5 with pain stimulation, RLE 5/5 and LLE 4/5.  Bulk was normal and fasciculations were absent.   Motor Tone - Muscle tone was assessed at the  neck and appendages and was normal.  Reflexes - The patient's reflexes were 1+ in all extremities and he had bilateral babinski reflexes.  Sensory - Light touch, temperature/pinprick were assessed and were symmetrical.    Coordination - no cooperative on test.  Tremor was absent.  Gait and Station - not tested due to safety concerns  Discharge Diet     tube feedlings via PEG  DISCHARGE PLAN  Disposition:  Discharge to skilled nursing facility for ongoing PT, OT and ST.   Ongoing risk factor control by Primary Care Physician at time of discharge  Follow-up PCP in 2 weeks after discharge from rehab  Follow-up with Dr. Marvel Plan, Stroke Clinic in 6 weeks, office to schedule an appointment.  40 minutes were spent preparing discharge.  Marvel Plan, MD PhD Stroke Neurology 07/06/2016 12:01 PM

## 2016-07-02 NOTE — Progress Notes (Signed)
  Subjective: No complaints  Objective: Vital signs in last 24 hours: Temp:  [97.5 F (36.4 C)-99.5 F (37.5 C)] 98.7 F (37.1 C) (09/29 0509) Pulse Rate:  [64-78] 78 (09/29 0509) Resp:  [18] 18 (09/29 0509) BP: (138-177)/(57-93) 177/93 (09/29 0509) SpO2:  [96 %-100 %] 100 % (09/29 0509) Weight:  [92.7 kg (204 lb 5.9 oz)] 92.7 kg (204 lb 5.9 oz) (09/29 0509) Last BM Date: 06/28/16  Intake/Output from previous day: 09/28 0701 - 09/29 0700 In: 1255.6 [NG/GT:1255.6] Out: 100 [Urine:100] Intake/Output this shift: No intake/output data recorded.  General appearance: no distress Resp: clear to auscultation bilaterally GI: soft, NT, +BS  Lab Results:   Recent Labs  06/30/16 0316 07/01/16 0224  WBC 9.3 12.2*  HGB 12.1* 11.7*  HCT 37.6* 36.4*  PLT 206 205   BMET  Recent Labs  06/30/16 0316 07/01/16 0224  NA 144 143  K 3.5 3.5  CL 110 111  CO2 24 27  GLUCOSE 137* 143*  BUN 21* 22*  CREATININE 0.94 0.98  CALCIUM 8.8* 8.7*   PT/INR No results for input(s): LABPROT, INR in the last 72 hours. ABG No results for input(s): PHART, HCO3 in the last 72 hours.  Invalid input(s): PCO2, PO2  Studies/Results: Ct Head Wo Contrast  Result Date: 06/30/2016 CLINICAL DATA:  Intra cerebral hemorrhage EXAM: CT HEAD WITHOUT CONTRAST TECHNIQUE: Contiguous axial images were obtained from the base of the skull through the vertex without intravenous contrast. COMPARISON:  CT head 06/24/2016 FINDINGS: Brain: Right frontal hematoma unchanged in size measuring approximately 3.5 x 5.5 cm. Partial resorption of high density blood products since prior study. No evidence of new hemorrhage since the prior study. Mass-effect on the right frontal lobe with 4 mm midline shift is unchanged. There is low-density edema surrounding the hematoma. Chronic left occipital infarct unchanged No new area of infarction or hemorrhage since the  prior study. Vascular: No hyperdense vessel or unexpected  calcification. Skull: Negative Sinuses/Orbits: Negative Other: None IMPRESSION: Large right frontal hematoma unchanged. 4 mm of midline shift unchanged. No new findings. Electronically Signed   By: Marlan Palauharles  Clark M.D.   On: 06/30/2016 13:25    Anti-infectives: Anti-infectives    None      Assessment/Plan: Dysphagia due to CVA - for PEG placement this AM. I called his wife and discussed the procedure, risks, and benefits. She agrees and will sign the consent when she comes at 9:30.   LOS: 8 days    Keanthony Poole E 07/02/2016

## 2016-07-02 NOTE — Progress Notes (Signed)
STROKE TEAM PROGRESS NOTE   SUBJECTIVE (INTERVAL HISTORY) Patient had PEG placed this am. Complains of new R knee pain. Wife reports hx gout with flare during a previous stroke.     OBJECTIVE Temp:  [97.5 F (36.4 C)-99 F (37.2 C)] 98.9 F (37.2 C) (09/29 1250) Pulse Rate:  [64-78] 68 (09/29 1250) Cardiac Rhythm: Normal sinus rhythm (09/29 0802) Resp:  [18-21] 18 (09/29 1250) BP: (151-212)/(57-93) 167/79 (09/29 1250) SpO2:  [98 %-100 %] 100 % (09/29 1250) Weight:  [92.7 kg (204 lb 5.9 oz)] 92.7 kg (204 lb 5.9 oz) (09/29 0509)  CBC:   Recent Labs Lab 06/30/16 0316 07/01/16 0224  WBC 9.3 12.2*  HGB 12.1* 11.7*  HCT 37.6* 36.4*  MCV 90.2 91.0  PLT 206 205    Basic Metabolic Panel:   Recent Labs Lab 06/30/16 0316 07/01/16 0224  NA 144 143  K 3.5 3.5  CL 110 111  CO2 24 27  GLUCOSE 137* 143*  BUN 21* 22*  CREATININE 0.94 0.98  CALCIUM 8.8* 8.7*    Lipid Panel:     Component Value Date/Time   CHOL 216 (H) 06/28/2016 0347   TRIG 114 06/28/2016 0347   HDL 35 (L) 06/28/2016 0347   CHOLHDL 6.2 06/28/2016 0347   VLDL 23 06/28/2016 0347   LDLCALC 158 (H) 06/28/2016 0347   HgbA1c:  Lab Results  Component Value Date   HGBA1C 6.0 (H) 02/09/2015   Urine Drug Screen:     Component Value Date/Time   LABOPIA NONE DETECTED 02/08/2015 1823   COCAINSCRNUR NONE DETECTED 02/08/2015 1823   LABBENZ NONE DETECTED 02/08/2015 1823   AMPHETMU NONE DETECTED 02/08/2015 1823   THCU NONE DETECTED 02/08/2015 1823   LABBARB NONE DETECTED 02/08/2015 1823      IMAGING I have personally reviewed the radiological images below and agree with the radiology interpretations.  Ct Head Wo Contrast 06/24/2016 1. ~32 cc right frontal acute parenchymal hematoma. Mild local subarachnoid extension. 4 mm midline shift.  2. Gliosis from remote left occipital lobar hemorrhage in 2016. Possible amyloid angiopathy, but no typical chronic hemorrhagic foci on 2016 brain MRI. Question hemorrhagic  conversion of infarct.   06/30/2016 IMPRESSION: Large right frontal hematoma unchanged. 4 mm of midline shift unchanged. No new findings.   Mr Brain Wo Contrast  06/26/2016 Moderate to severely motion degraded examination. Acute RIGHT frontal lobe 2.8 x 5.9 x 3.7 cm hematoma, resulting in mild RIGHT to LEFT midline shift. Old LEFT occipital lobe hemorrhagic infarct.  EEG 06/25/2016 IMPRESSION: This is an abnormal EEG demonstrating a mild diffuse slowing of electrocerebral activity.  This can be seen in a wide variety of encephalopathic state including those of a toxic, metabolic, or degenerative nature.  There were no focal, hemispheric, or lateralizing features.  No epileptiform activity was recorded.  TTE  - Left ventricle: The cavity size was normal. There was moderate concentric hypertrophy. Systolic function was normal. The estimated ejection fraction was in the range of 60% to 65%. Diffuse hypokinesis. Doppler parameters are consistent with abnormal left ventricular relaxation (grade 1 diastolic dysfunction). Doppler parameters are consistent with elevated ventricular end-diastolic filling pressure. - Ventricular septum: Septal motion showed paradox. - Aortic valve: There was mild stenosis. Valve area (VTI): 2.48 cm^2. Valve area (Vmax): 2.06 cm^2. Valve area (Vmean): 2.2 cm^2. - Aortic root: The aortic root was normal in size. - Mitral valve: Structurally normal valve. - Right ventricle: The cavity size was normal. Wall thickness was normal. Systolic function was normal. -  Tricuspid valve: There was no regurgitation. - Pulmonary arteries: Systolic pressure could not be accurately estimated. - Inferior vena cava: The vessel was normal in size. - Pericardium, extracardiac: There was no pericardial effusion. Impressions: - Poor quality study with limited endocardial visualization, however there appears to be normal biventricular function.  PHYSICAL EXAM  Temp:  [97.5 F (36.4 C)-99 F  (37.2 C)] 98.9 F (37.2 C) (09/29 1250) Pulse Rate:  [64-78] 68 (09/29 1250) Resp:  [18-21] 18 (09/29 1250) BP: (151-212)/(57-93) 167/79 (09/29 1250) SpO2:  [98 %-100 %] 100 % (09/29 1250) Weight:  [92.7 kg (204 lb 5.9 oz)] 92.7 kg (204 lb 5.9 oz) (09/29 0509)  General - Well nourished, well developed, in no apparent distress.  Ophthalmologic - Fundi not visualized due to noncooperation.  Cardiovascular - Regular rate and rhythm.  Mental Status -  Level of arousal and orientation to self, but not orientated to place, time or people. Language exam showed paucity of speech, able to follow simple commands after several requests, not able to name but able to repeat simple sentences. Left neglect.  Cranial Nerves II - XII - II - Visual field exam showed left visual field neglect. III, IV, VI - right gaze preference. V - Facial sensation intact bilaterally. VII - left facial droop. VIII - Hearing & vestibular intact bilaterally. X - Palate elevates symmetrically. XI - Chin turning & shoulder shrug intact bilaterally. XII - Tongue protrusion to the left.  Motor Strength - The patient's strength was 5/5 RUE, RLE limited ROM with pain at knee and mildly swelling knee, LUE 2/5 with pain stimulation, LLE 4/5.  Bulk was normal and fasciculations were absent.   Motor Tone - Muscle tone was assessed at the neck and appendages and was normal.  Reflexes - The patient's reflexes were 1+ in all extremities and he had bilateral babinski reflexes.  Sensory - Light touch, temperature/pinprick were assessed and were symmetrical.    Coordination - no cooperative on test.  Tremor was absent.  Gait and Station - not tested due to safety concerns   ASSESSMENT/PLAN [name] is a 81 y.o. male with history of HTN, diabetes, CAD, dementia, cured atrial flutter not on [facility], alcohol abuse, previous lobar ICH presenting with confusion, "problems" getting around x 1 week. CT showed a R frontal ICH with  midline shift. Noncompliant with medications, not taking his HTN medications.   Stroke:  right frontal ICH with 4mm midline shift in setting of hypertension, etiology could be due to hypertensive due to noncompliance or CAA given recurrent lobar ICHs  Resultant HA, baseline confusion  CT - right frontal ICH  MRI - Acute RIGHT frontal lobe 2.8 x 5.9 x 3.7 cm hematoma, stable size  Repeat CT - stable hematoma and increased edema  EEG - mild general slowing, no seizure - d/c keppra  2D echo - EF 60-65%  Heparin subq for VTE prophylaxis  aspirin 81 mg daily prior to admission, now on No antithrombotic given hemorrhage. Due to concern of CAA etiology, will not recommend antiplatelet in the future.  Ongoing aggressive stroke risk factor management  Therapy recommendations: SNF  Disposition:  SNF   Medically ready for discharge to SNF once bed available  History of lobar ICH  02/2015 left parietal occipital ICH/IVH/SAH/SDH  BP high and controlled with 4 BP meds at discharge  Not compliant with BP meds  Considered hypertensive ICH and put on ASA 81 later  Resultant significant language deficit and cognitive impairment  Atrial Fibrillation  Home anticoagulation:  none   On coumadin until 2011 then off. Dr. [place] considered his Aflutter cured  Hypertension  On coreg, catapres, hydralazine, HCTZ, lisinopril prior to admission - not taking per daughter was noncompliant  Off nicardipine for BP control  Currently BP stable  Resumed coreg, hydralazine and lisinopril for BP control, increased lisinopril to 20mg  bid  SBP goal < 160  Long-term BP goal normotensive  Hyperlipidemia  LDL 158, goal < 70  Put on lipitor 20mg   Continue statin at discharge  Diabetes type II  On glucophage at home  HbA1c 6.0  Resume glucophage  Vascular dementia  Baseline dementia  On seroquel and remoron at home  Resume seroquel at bedtime for  sundowning.  Dysphagia  Secondary to stroke  Has panda and TF  Trauma placed PEG this am. Ok to use after 4 hours  Resume TF 4h post placement  R knee pain  Hx gout  R knee with edema, pain to touch  Start colchicine once able to use PEG - 1.2 mg x 1 followed by 0.6 mg 1 hour later.   Resume allopurinol, home dose  Other Stroke Risk Factors  Advanced age  Obesity, Body mass index is 30.18 kg/m., recommend weight loss, diet and exercise as appropriate   Coronary artery disease - MI  Noncompliant with medications and medical management   Marvel Plan, MD PhD Stroke Neurology 07/02/2016 7:23 PM    To contact Stroke Continuity provider, please refer to [email or web address]. After hours, contact General Neurology

## 2016-07-02 NOTE — Progress Notes (Signed)
Wife at bedside at this time and consent for procedure this AM.   Sim BoastHavy, RN

## 2016-07-02 NOTE — Anesthesia Procedure Notes (Signed)
Procedure Name: MAC Date/Time: 07/02/2016 11:33 AM Performed by: Rogelia BogaMUELLER, Avi Kerschner P Pre-anesthesia Checklist: Patient identified, Emergency Drugs available, Suction available, Patient being monitored and Timeout performed Patient Re-evaluated:Patient Re-evaluated prior to inductionOxygen Delivery Method: Nasal cannula

## 2016-07-02 NOTE — Anesthesia Preprocedure Evaluation (Signed)
Anesthesia Evaluation  Patient identified by MRN, date of birth, ID band Patient awake    Reviewed: Allergy & Precautions, NPO status , Patient's Chart, lab work & pertinent test results  Airway Mallampati: II  TM Distance: >3 FB     Dental   Pulmonary sleep apnea , former smoker,    breath sounds clear to auscultation       Cardiovascular hypertension, + angina + CAD, + Past MI, + Peripheral Vascular Disease and +CHF   Rhythm:Regular Rate:Normal     Neuro/Psych    GI/Hepatic Neg liver ROS, GERD  ,  Endo/Other  diabetes  Renal/GU negative Renal ROS     Musculoskeletal   Abdominal   Peds  Hematology   Anesthesia Other Findings   Reproductive/Obstetrics                             Anesthesia Physical Anesthesia Plan  ASA: III  Anesthesia Plan: MAC   Post-op Pain Management:    Induction: Intravenous  Airway Management Planned: Simple Face Mask  Additional Equipment:   Intra-op Plan:   Post-operative Plan:   Informed Consent:   Dental advisory given  Plan Discussed with: CRNA and Anesthesiologist  Anesthesia Plan Comments:         Anesthesia Quick Evaluation

## 2016-07-02 NOTE — Progress Notes (Signed)
PT Cancellation Note  Patient Details Name: Austin Sawyer MRN: [id number] DOB: 01/29/1935   Cancelled Treatment:    Reason Eval/Treat Not Completed: Patient at procedure or test/unavailable   [name], [name] 07/02/2016, 10:33 AM [name], PT DPT  [phone]

## 2016-07-02 NOTE — Care Management Note (Signed)
Case Management Note  Patient Details  Name: Austin Sawyer MRN: [id number] Date of Birth: 01/15/1935  Subjective/Objective:                    Action/Plan: Patient received PEG tube today. [facility] CM left message for April patients [facility] representative and informed her of the plan. CM following for any further d/c needs.  Expected Discharge Date:                  Expected Discharge Plan:  Skilled Nursing Facility  In-House Referral:     Discharge planning Services  CM Consult  Post Acute Care Choice:    Choice offered to:  Spouse, Adult Children  DME Arranged:    DME Agency:     HH Arranged:    HH Agency:     Status of Service:  In process, will continue to follow  If discussed at Long Length of Stay Meetings, dates discussed:    Additional Comments:  Austin BaloKelli F Homero Hyson, RN 07/02/2016, 2:42 PM

## 2016-07-02 NOTE — Anesthesia Postprocedure Evaluation (Signed)
Anesthesia Post Note  Patient: Austin Sawyer  Procedure(s) Performed: Procedure(s) (LRB): PERCUTANEOUS ENDOSCOPIC GASTROSTOMY (PEG) PLACEMENT (N/A)  Patient location during evaluation: Endoscopy Anesthesia Type: MAC Level of consciousness: awake Pain management: pain level controlled Vital Signs Assessment: post-procedure vital signs reviewed and stable Respiratory status: spontaneous breathing Cardiovascular status: stable Anesthetic complications: no    Last Vitals:  Vitals:   07/02/16 1210 07/02/16 1250  BP: (!) 181/77 (!) 167/79  Pulse: 67 68  Resp: (!) 21 18  Temp:  37.2 C    Last Pain:  Vitals:   07/02/16 1250  TempSrc: Oral  PainSc: 0-No pain                 EDWARDS,Cailey Trigueros

## 2016-07-02 NOTE — Progress Notes (Signed)
Daughter, [name], called for update on patient.  Questions answered.  Will continue to monitor.

## 2016-07-02 NOTE — Progress Notes (Signed)
PEG placement was successful. Dressings and abdominal binder applied.

## 2016-07-02 NOTE — Op Note (Signed)
[facility] Patient Name: Austin Sawyer Procedure Date : 07/02/2016 MRN: [id number] Attending MD: [name] Markon Jares , MD Date of Birth: 06/19/1935 CSN: [id number] Age: 81 Admit Type: Inpatient Procedure:                Upper GI endoscopy Indications:               Providers:                [name] Parissa Chiao, MD, Priscella MannAutumn Goldsmith, RN, Oletha Blendavida                            Shoffner, Technician, Kirt Boyshomas Mueller, CRNA Referring MD:              Medicines:                Midazolam 2 mg IV, Propofol per Anesthesia, Ancef                            2000 mg IV Complications:            No immediate complications. Estimated Blood Loss:     Estimated blood loss was minimal. Procedure:                Pre-Anesthesia Assessment:                           - Prior to the procedure, a History and Physical                            was performed, and patient medications and                            allergies were reviewed. The patient is unable to                            give consent secondary to the patient's altered                            mental status. The risks and benefits of the                            procedure and the sedation options and risks were                            discussed with the patient's spouse. All questions                            were answered and informed consent was obtained.                            Patient identification and proposed procedure were                            verified by the physician, the nurse and the  anesthetist. Mental Status Examination: sedated.                            Respiratory Examination: expiratory wheezes. CV                            Examination: regular rate and rhythm. ASA Grade                            Assessment: III - A patient with severe systemic                            disease. After reviewing the risks and benefits,                            the patient was deemed in  satisfactory condition to                            undergo the procedure. The anesthesia plan was to                            use moderate sedation / analgesia (conscious                            sedation). Immediately prior to administration of                            medications, the patient was re-assessed for                            adequacy to receive sedatives. The heart rate,                            respiratory rate, oxygen saturations, blood                            pressure, adequacy of pulmonary ventilation, and                            response to care were monitored throughout the                            procedure. The physical status of the patient was                            re-assessed after the procedure.                           After obtaining informed consent, the endoscope was                            passed under direct vision. Throughout the  procedure, the patient's blood pressure, pulse, and                            oxygen saturations were monitored continuously. The                            EG-[id number] ([id number]) scope was introduced through the                            mouth, and advanced to the duodenal bulb. The upper                            GI endoscopy was accomplished without difficulty.                            The patient tolerated the procedure fairly well. Scope In: Scope Out: Findings:      The entire examined stomach was normal. Placement of an externally       removable PEG with no T-fasteners was successfully completed. The       external bumper was at the 4.5 cm marking on the tube. Estimated blood       loss was minimal.      No gross lesions were noted in the duodenal bulb. Impression:               - Normal stomach.                           - No gross lesions in the duodenal bulb.                           - An externally removable PEG placement was                             successfully completed.                           - No specimens collected. Recommendation:           - Return patient to hospital ward for ongoing care. Procedure Code(s):        --- Professional ---                           [id number], Esophagogastroduodenoscopy, flexible,                            transoral; with directed placement of percutaneous                            gastrostomy tube Diagnosis Code(s):        --- Professional ---                           W11.914, Dysphagia following cerebral infarction CPT copyright 2016 American Medical Association. All rights reserved. The codes documented in this report are preliminary and upon coder review may  be revised to meet current compliance requirements. Violeta Gelinas, MD 07/02/2016 12:04:01  PM This report has been signed electronically. Number of Addenda: 0

## 2016-07-03 LAB — BASIC METABOLIC PANEL
Anion gap: 11 (ref 5–15)
BUN: 22 mg/dL — AB (ref 6–20)
CHLORIDE: 104 mmol/L (ref 101–111)
CO2: 25 mmol/L (ref 22–32)
CREATININE: 0.89 mg/dL (ref 0.61–1.24)
Calcium: 9 mg/dL (ref 8.9–10.3)
GFR calc Af Amer: >60 mL/min (ref >60)
GFR calc non Af Amer: >60 mL/min (ref >60)
Glucose, Bld: 109 mg/dL — ABNORMAL HIGH (ref 65–99)
POTASSIUM: 3.9 mmol/L (ref 3.5–5.1)
Sodium: 140 mmol/L (ref 135–145)

## 2016-07-03 LAB — GLUCOSE, CAPILLARY
GLUCOSE-CAPILLARY: 146 mg/dL — AB (ref 65–99)
Glucose-Capillary: 131 mg/dL — ABNORMAL HIGH (ref 65–99)
Glucose-Capillary: 130 mg/dL — ABNORMAL HIGH (ref 65–99)
Glucose-Capillary: 114 mg/dL — ABNORMAL HIGH (ref 65–99)
Glucose-Capillary: 117 mg/dL — ABNORMAL HIGH (ref 65–99)

## 2016-07-03 LAB — CBC
HEMATOCRIT: 39.1 % (ref 39.0–52.0)
HEMOGLOBIN: 12.5 g/dL — AB (ref 13.0–17.0)
MCH: 29.4 pg (ref 26.0–34.0)
MCHC: 32 g/dL (ref 30.0–36.0)
MCV: 92 fL (ref 78.0–100.0)
Platelets: 180 10*3/uL (ref 150–400)
RBC: 4.25 MIL/uL (ref 4.22–5.81)
RDW: 14.2 % (ref 11.5–15.5)
WBC: 12 10*3/uL — ABNORMAL HIGH (ref 4.0–10.5)
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
## 2016-07-03 NOTE — Progress Notes (Signed)
1 Day Post-Op  Subjective: No complaints   Objective: Vital signs in last 24 hours: Temp:  [98 F (36.7 C)-99.7 F (37.6 C)] 98 F (36.7 C) (09/30 0918) Pulse Rate:  [66-86] 73 (09/30 0918) Resp:  [16-21] 18 (09/30 0918) BP: (136-212)/(59-88) 150/77 (09/30 0918) SpO2:  [96 %-100 %] 100 % (09/30 0918) Last BM Date: 06/28/16  Intake/Output from previous day: 09/29 0701 - 09/30 0700 In: 873.3 [I.V.:500; NG/GT:133.3] Out: -  Intake/Output this shift: No intake/output data recorded.  Incision/Wound:PEG site clean  Soft NT   Lab Results:   Recent Labs  07/01/16 0224 07/03/16 0844  WBC 12.2* 12.0*  HGB 11.7* 12.5*  HCT 36.4* 39.1  PLT 205 180   BMET  Recent Labs  07/01/16 0224 07/03/16 0844  NA 143 140  K 3.5 3.9  CL 111 104  CO2 27 25  GLUCOSE 143* 109*  BUN 22* 22*  CREATININE 0.98 0.89  CALCIUM 8.7* 9.0   PT/INR No results for input(s): LABPROT, INR in the last 72 hours. ABG No results for input(s): PHART, HCO3 in the last 72 hours.  Invalid input(s): PCO2, PO2  Studies/Results: No results found.  Anti-infectives: Anti-infectives    Start     Dose/Rate Route Frequency Ordered Stop   07/02/16 1130  ceFAZolin (ANCEF) powder 2 g     2 g Other To Surgery 07/02/16 1127 07/02/16 1137      Assessment/Plan: s/p Procedure(s): PERCUTANEOUS ENDOSCOPIC GASTROSTOMY (PEG) PLACEMENT (N/A) Stable  May use in 24 hours   LOS: 9 days    Klynn Linnemann A. 07/03/2016

## 2016-07-03 NOTE — Progress Notes (Signed)
STROKE TEAM PROGRESS NOTE   SUBJECTIVE (INTERVAL HISTORY) Patient son at bedside today. Tube feeding started yesterday without issue. R knee pain getting better. Pending discharge to SNF.     OBJECTIVE Temp:  [98 F (36.7 C)-99.5 F (37.5 C)] 98.3 F (36.8 C) (09/30 1717) Pulse Rate:  [66-86] 72 (09/30 1717) Cardiac Rhythm: Normal sinus rhythm (09/30 0845) Resp:  [18-20] 20 (09/30 1717) BP: (136-157)/(59-86) 144/86 (09/30 1717) SpO2:  [96 %-100 %] 98 % (09/30 1717)  CBC:   Recent Labs Lab 07/01/16 0224 07/03/16 0844  WBC 12.2* 12.0*  HGB 11.7* 12.5*  HCT 36.4* 39.1  MCV 91.0 92.0  PLT 205 180    Basic Metabolic Panel:   Recent Labs Lab 07/01/16 0224 07/03/16 0844  NA 143 140  K 3.5 3.9  CL 111 104  CO2 27 25  GLUCOSE 143* 109*  BUN 22* 22*  CREATININE 0.98 0.89  CALCIUM 8.7* 9.0    Lipid Panel:     Component Value Date/Time   CHOL 216 (H) 06/28/2016 0347   TRIG 114 06/28/2016 0347   HDL 35 (L) 06/28/2016 0347   CHOLHDL 6.2 06/28/2016 0347   VLDL 23 06/28/2016 0347   LDLCALC 158 (H) 06/28/2016 0347   HgbA1c:  Lab Results  Component Value Date   HGBA1C 6.0 (H) 02/09/2015   Urine Drug Screen:     Component Value Date/Time   LABOPIA NONE DETECTED 02/08/2015 1823   COCAINSCRNUR NONE DETECTED 02/08/2015 1823   LABBENZ NONE DETECTED 02/08/2015 1823   AMPHETMU NONE DETECTED 02/08/2015 1823   THCU NONE DETECTED 02/08/2015 1823   LABBARB NONE DETECTED 02/08/2015 1823      IMAGING I have personally reviewed the radiological images below and agree with the radiology interpretations.  Ct Head Wo Contrast 06/24/2016 1. ~32 cc right frontal acute parenchymal hematoma. Mild local subarachnoid extension. 4 mm midline shift.  2. Gliosis from remote left occipital lobar hemorrhage in 2016. Possible amyloid angiopathy, but no typical chronic hemorrhagic foci on 2016 brain MRI. Question hemorrhagic conversion of infarct.   06/30/2016 IMPRESSION: Large right  frontal hematoma unchanged. 4 mm of midline shift unchanged. No new findings.   Mr Brain Wo Contrast  06/26/2016 Moderate to severely motion degraded examination. Acute RIGHT frontal lobe 2.8 x 5.9 x 3.7 cm hematoma, resulting in mild RIGHT to LEFT midline shift. Old LEFT occipital lobe hemorrhagic infarct.  EEG 06/25/2016 IMPRESSION: This is an abnormal EEG demonstrating a mild diffuse slowing of electrocerebral activity.  This can be seen in a wide variety of encephalopathic state including those of a toxic, metabolic, or degenerative nature.  There were no focal, hemispheric, or lateralizing features.  No epileptiform activity was recorded.  TTE  - Left ventricle: The cavity size was normal. There was moderate concentric hypertrophy. Systolic function was normal. The estimated ejection fraction was in the range of 60% to 65%. Diffuse hypokinesis. Doppler parameters are consistent with abnormal left ventricular relaxation (grade 1 diastolic dysfunction). Doppler parameters are consistent with elevated ventricular end-diastolic filling pressure. - Ventricular septum: Septal motion showed paradox. - Aortic valve: There was mild stenosis. Valve area (VTI): 2.48 cm^2. Valve area (Vmax): 2.06 cm^2. Valve area (Vmean): 2.2 cm^2. - Aortic root: The aortic root was normal in size. - Mitral valve: Structurally normal valve. - Right ventricle: The cavity size was normal. Wall thickness was normal. Systolic function was normal. - Tricuspid valve: There was no regurgitation. - Pulmonary arteries: Systolic pressure could not be accurately estimated. -  Inferior vena cava: The vessel was normal in size. - Pericardium, extracardiac: There was no pericardial effusion. Impressions: - Poor quality study with limited endocardial visualization, however there appears to be normal biventricular function.  PHYSICAL EXAM  Temp:  [98 F (36.7 C)-99.5 F (37.5 C)] 98.3 F (36.8 C) (09/30 1717) Pulse Rate:   [66-86] 72 (09/30 1717) Resp:  [18-20] 20 (09/30 1717) BP: (136-157)/(59-86) 144/86 (09/30 1717) SpO2:  [96 %-100 %] 98 % (09/30 1717)  General - Well nourished, well developed, in no apparent distress.  Ophthalmologic - Fundi not visualized due to noncooperation.  Cardiovascular - Regular rate and rhythm.  Mental Status -  Level of arousal and orientation to self, but not orientated to place, time or people. Language exam showed paucity of speech, able to follow simple commands after several requests, not able to name but able to repeat simple sentences. Left neglect.  Cranial Nerves II - XII - II - Visual field exam showed left visual field neglect. III, IV, VI - right gaze preference. V - Facial sensation intact bilaterally. VII - left facial droop. VIII - Hearing & vestibular intact bilaterally. X - Palate elevates symmetrically. XI - Chin turning & shoulder shrug intact bilaterally. XII - Tongue protrusion to the left.  Motor Strength - The patient's strength was 5/5 RUE, RLE pain at knee improved, LUE 2/5 with pain stimulation, LLE 4/5.  Bulk was normal and fasciculations were absent.   Motor Tone - Muscle tone was assessed at the neck and appendages and was normal.  Reflexes - The patient's reflexes were 1+ in all extremities and he had bilateral babinski reflexes.  Sensory - Light touch, temperature/pinprick were assessed and were symmetrical.    Coordination - no cooperative on test.  Tremor was absent.  Gait and Station - not tested due to safety concerns   ASSESSMENT/PLAN Mr. Arafat Cocuzza is a 81 y.o. male with history of HTN, diabetes, CAD, dementia, cured atrial flutter not on [facility], alcohol abuse, previous lobar ICH presenting with confusion, "problems" getting around x 1 week. CT showed a R frontal ICH with midline shift. Noncompliant with medications, not taking his HTN medications.   Stroke:  right frontal ICH with 4mm midline shift in setting of hypertension,  etiology could be due to hypertensive due to noncompliance or CAA given recurrent lobar ICHs  Resultant HA, baseline confusion  CT - right frontal ICH  MRI - Acute RIGHT frontal lobe 2.8 x 5.9 x 3.7 cm hematoma, stable size  Repeat CT - stable hematoma and increased edema  EEG - mild general slowing, no seizure - d/c keppra  2D echo - EF 60-65%  Heparin subq for VTE prophylaxis  aspirin 81 mg daily prior to admission, now on No antithrombotic given hemorrhage. Due to concern of CAA etiology, will not recommend antiplatelet in the future.  Ongoing aggressive stroke risk factor management  Therapy recommendations: SNF  Disposition:  SNF   Medically ready for discharge to SNF once bed available  History of lobar ICH  02/2015 left parietal occipital ICH/IVH/SAH/SDH  BP high and controlled with 4 BP meds at discharge  Not compliant with BP meds  Considered hypertensive ICH and put on ASA 81 later  Resultant significant language deficit and cognitive impairment  Atrial Fibrillation  Home anticoagulation:  none   On coumadin until 2011 then off. Dr. Malin Lions considered his Aflutter cured  Hypertension  On coreg, catapres, hydralazine, HCTZ, lisinopril prior to admission - not taking per daughter was  noncompliant  Off nicardipine for BP control  Currently BP stable  Resumed coreg, hydralazine and lisinopril for BP control, increased lisinopril to 20mg  bid  SBP goal < 160  Long-term BP goal normotensive  Hyperlipidemia  LDL 158, goal < 70  Put on lipitor 20mg   Continue statin at discharge  Diabetes type II  On glucophage at home  HbA1c 6.0  Resume glucophage  Vascular dementia  Baseline dementia  On seroquel and remoron at home  Resume seroquel at bedtime for sundowning.  Dysphagia  Secondary to stroke  Has PEG and TF restarted  R knee pain  Hx gout  R knee pain improved after colchicine   Resume allopurinol, home dose  Other  Stroke Risk Factors  Advanced age  Obesity, Body mass index is 30.18 kg/m., recommend weight loss, diet and exercise as appropriate   Coronary artery disease - MI  Noncompliant with medications and medical management  Hospital day # 9   Marvel PlanJindong Seniah Lawrence, MD PhD Stroke Neurology 07/03/2016 5:23 PM    To contact Stroke Continuity provider, please refer to [email or web address]. After hours, contact General Neurology

## 2016-07-03 NOTE — Clinical Social Work Note (Signed)
LATE ENTRY Pt and wife were seen on 07/01/2016  CSW met with pt's wife to provide bed offers. Pt's wife is interested in [facility]. CSW will contact facility to inquire about a potential bed offer. CSW encouraged pt's wife to have a second choice in the event [facility] is unable to make a bed offers. CSW will continue to follow.   Darden Dates, MSW, LCSW  Clinical Social Worker  [phone]

## 2016-07-03 NOTE — Clinical Social Work Note (Signed)
LATE ENTRY Pt and wife were seen on 07/02/2016  CSW met with pt and wife to address bed choice. CSW spoke with [facility] regarding bed offer and one was extended. Potential discharge on Saturday. [facility] is pending as it was started using an incorrect address. CSW sent discharge summary to [facility] to facility, in anticipation for Saturday discharge. Pt's wife shared that she will complete paperwork on day of discharge at that facility. CSW will continue to follow.   Darden Dates, MSW, LCSW  Clinical Social Worker [phone]

## 2016-07-03 NOTE — Progress Notes (Signed)
Patient ID: Austin Sawyer, male   DOB: 03/19/1935, 81 y.o.   MRN: [id number] OK to continue to use PEG for meds and TF. [name] [name], MD, MPH, FACS Trauma: [phone] General Surgery: [phone]

## 2016-07-04 LAB — CBC
HEMATOCRIT: 33.4 % — AB (ref 39.0–52.0)
HEMOGLOBIN: 12 g/dL — AB (ref 13.0–17.0)
MCH: 32.5 pg (ref 26.0–34.0)
MCHC: 35.9 g/dL (ref 30.0–36.0)
MCV: 90.5 fL (ref 78.0–100.0)
Platelets: 393 10*3/uL (ref 150–400)
RBC: 3.69 MIL/uL — AB (ref 4.22–5.81)
RDW: 14.6 % (ref 11.5–15.5)
WBC: 6.4 10*3/uL (ref 4.0–10.5)

## 2016-07-04 LAB — GLUCOSE, CAPILLARY
GLUCOSE-CAPILLARY: 101 mg/dL — AB (ref 65–99)
GLUCOSE-CAPILLARY: 116 mg/dL — AB (ref 65–99)
GLUCOSE-CAPILLARY: 117 mg/dL — AB (ref 65–99)
GLUCOSE-CAPILLARY: 117 mg/dL — AB (ref 65–99)
GLUCOSE-CAPILLARY: 135 mg/dL — AB (ref 65–99)
Glucose-Capillary: 116 mg/dL — ABNORMAL HIGH (ref 65–99)
Glucose-Capillary: 130 mg/dL — ABNORMAL HIGH (ref 65–99)

## 2016-07-04 LAB — BASIC METABOLIC PANEL
ANION GAP: 10 (ref 5–15)
BUN: 24 mg/dL — ABNORMAL HIGH (ref 6–20)
CHLORIDE: 105 mmol/L (ref 101–111)
CO2: 26 mmol/L (ref 22–32)
Calcium: 8.8 mg/dL — ABNORMAL LOW (ref 8.9–10.3)
Creatinine, Ser: 0.95 mg/dL (ref 0.61–1.24)
GFR calc non Af Amer: >60 mL/min (ref >60)
GLUCOSE: 127 mg/dL — AB (ref 65–99)
POTASSIUM: 4.2 mmol/L (ref 3.5–5.1)
Sodium: 141 mmol/L (ref 135–145)

## 2016-07-04 MED ORDER — CARVEDILOL 6.25 MG PO TABS
6.2500 mg | ORAL_TABLET | Freq: Two times a day (BID) | ORAL | Status: DC
Start: 2016-07-04 — End: 2016-07-07
  Administered 2016-07-04 – 2016-07-06 (×5): 6.25 mg via ORAL
  Filled 2016-07-04 (×5): qty 1

## 2016-07-04 NOTE — Progress Notes (Signed)
STROKE TEAM PROGRESS NOTE   SUBJECTIVE (INTERVAL HISTORY) Patient son at bedside today. Tube feeding without issue. R knee pain much improved. Pending discharge to SNF.     OBJECTIVE Temp:  [98.1 F (36.7 C)-99.8 F (37.7 C)] 98.3 F (36.8 C) (10/01 0908) Pulse Rate:  [70-91] 84 (10/01 0908) Cardiac Rhythm: Normal sinus rhythm (09/30 1900) Resp:  [18-20] 20 (10/01 0908) BP: (140-159)/(64-91) 146/91 (10/01 0908) SpO2:  [98 %-100 %] 98 % (10/01 0908) Weight:  [94.7 kg (208 lb 12.4 oz)] 94.7 kg (208 lb 12.4 oz) (10/01 0409)  CBC:   Recent Labs Lab 07/03/16 0844 07/04/16 0610  WBC 12.0* 6.4  HGB 12.5* 12.0*  HCT 39.1 33.4*  MCV 92.0 90.5  PLT 180 393    Basic Metabolic Panel:   Recent Labs Lab 07/03/16 0844 07/04/16 0610  NA 140 141  K 3.9 4.2  CL 104 105  CO2 25 26  GLUCOSE 109* 127*  BUN 22* 24*  CREATININE 0.89 0.95  CALCIUM 9.0 8.8*    Lipid Panel:     Component Value Date/Time   CHOL 216 (H) 06/28/2016 0347   TRIG 114 06/28/2016 0347   HDL 35 (L) 06/28/2016 0347   CHOLHDL 6.2 06/28/2016 0347   VLDL 23 06/28/2016 0347   LDLCALC 158 (H) 06/28/2016 0347   HgbA1c:  Lab Results  Component Value Date   HGBA1C 6.0 (H) 02/09/2015   Urine Drug Screen:     Component Value Date/Time   LABOPIA NONE DETECTED 02/08/2015 1823   COCAINSCRNUR NONE DETECTED 02/08/2015 1823   LABBENZ NONE DETECTED 02/08/2015 1823   AMPHETMU NONE DETECTED 02/08/2015 1823   THCU NONE DETECTED 02/08/2015 1823   LABBARB NONE DETECTED 02/08/2015 1823      IMAGING I have personally reviewed the radiological images below and agree with the radiology interpretations.  Ct Head Wo Contrast 06/24/2016 1. ~32 cc right frontal acute parenchymal hematoma. Mild local subarachnoid extension. 4 mm midline shift.  2. Gliosis from remote left occipital lobar hemorrhage in 2016. Possible amyloid angiopathy, but no typical chronic hemorrhagic foci on 2016 brain MRI. Question hemorrhagic  conversion of infarct.   06/30/2016 IMPRESSION: Large right frontal hematoma unchanged. 4 mm of midline shift unchanged. No new findings.     Mr Brain Wo Contrast  06/26/2016 Moderate to severely motion degraded examination. Acute RIGHT frontal lobe 2.8 x 5.9 x 3.7 cm hematoma, resulting in mild RIGHT to LEFT midline shift. Old LEFT occipital lobe hemorrhagic infarct.  EEG 06/25/2016 IMPRESSION: This is an abnormal EEG demonstrating a mild diffuse slowing of electrocerebral activity.  This can be seen in a wide variety of encephalopathic state including those of a toxic, metabolic, or degenerative nature.  There were no focal, hemispheric, or lateralizing features.  No epileptiform activity was recorded.  TTE  - Left ventricle: The cavity size was normal. There was moderate concentric hypertrophy. Systolic function was normal. The estimated ejection fraction was in the range of 60% to 65%. Diffuse hypokinesis. Doppler parameters are consistent with abnormal left ventricular relaxation (grade 1 diastolic dysfunction). Doppler parameters are consistent with elevated ventricular end-diastolic filling pressure. - Ventricular septum: Septal motion showed paradox. - Aortic valve: There was mild stenosis. Valve area (VTI): 2.48 cm^2. Valve area (Vmax): 2.06 cm^2. Valve area (Vmean): 2.2 cm^2. - Aortic root: The aortic root was normal in size. - Mitral valve: Structurally normal valve. - Right ventricle: The cavity size was normal. Wall thickness was normal. Systolic function was normal. - Tricuspid  valve: There was no regurgitation. - Pulmonary arteries: Systolic pressure could not be accurately estimated. - Inferior vena cava: The vessel was normal in size. - Pericardium, extracardiac: There was no pericardial effusion. Impressions: - Poor quality study with limited endocardial visualization, however there appears to be normal biventricular function.  PHYSICAL EXAM  Temp:  [98.1 F (36.7  C)-99.8 F (37.7 C)] 98.3 F (36.8 C) (10/01 0908) Pulse Rate:  [70-91] 84 (10/01 0908) Resp:  [18-20] 20 (10/01 0908) BP: (140-159)/(64-91) 146/91 (10/01 0908) SpO2:  [98 %-100 %] 98 % (10/01 0908) Weight:  [94.7 kg (208 lb 12.4 oz)] 94.7 kg (208 lb 12.4 oz) (10/01 0409)  General - Well nourished, well developed, in no apparent distress.  Ophthalmologic - Fundi not visualized due to noncooperation.  Cardiovascular - Regular rate and rhythm.  Mental Status -  Level of arousal and orientation to self, but not orientated to place, time or people. Language exam showed paucity of speech, able to follow simple commands after several requests, not able to name but able to repeat simple sentences. Left neglect.  Cranial Nerves II - XII - II - Visual field exam showed left visual field neglect. III, IV, VI - right gaze preference. V - Facial sensation intact bilaterally. VII - left facial droop. VIII - Hearing & vestibular intact bilaterally. X - Palate elevates symmetrically. XI - Chin turning & shoulder shrug intact bilaterally. XII - Tongue protrusion to the left.  Motor Strength - The patient's strength was 5/5 RUE, RLE pain at knee improved, LUE 2/5 with pain stimulation, LLE 4/5.  Bulk was normal and fasciculations were absent.   Motor Tone - Muscle tone was assessed at the neck and appendages and was normal.  Reflexes - The patient's reflexes were 1+ in all extremities and he had bilateral babinski reflexes.  Sensory - Light touch, temperature/pinprick were assessed and were symmetrical.    Coordination - no cooperative on test.  Tremor was absent.  Gait and Station - not tested due to safety concerns   ASSESSMENT/PLAN Mr. Ferdie Bakken is a 81 y.o. male with history of HTN, diabetes, CAD, dementia, cured atrial flutter not on [facility], alcohol abuse, previous lobar ICH presenting with confusion, "problems" getting around x 1 week. CT showed a R frontal ICH with midline shift.  Noncompliant with medications, not taking his HTN medications.   Stroke:  right frontal ICH with 4mm midline shift in setting of hypertension, etiology could be due to hypertensive due to noncompliance or CAA given recurrent lobar ICHs  Resultant HA, baseline confusion  CT - right frontal ICH  MRI - Acute RIGHT frontal lobe 2.8 x 5.9 x 3.7 cm hematoma, stable size  Repeat CT - stable hematoma and increased edema  EEG - mild general slowing, no seizure - d/c keppra  2D echo - EF 60-65%  Heparin subq for VTE prophylaxis  aspirin 81 mg daily prior to admission, now on No antithrombotic given hemorrhage. Due to concern of CAA etiology, will not recommend antiplatelet in the future.  Ongoing aggressive stroke risk factor management  Therapy recommendations: SNF  Disposition:  SNF   Medically ready for discharge to SNF once bed available  History of lobar ICH  02/2015 left parietal occipital ICH/IVH/SAH/SDH  BP high and controlled with 4 BP meds at discharge  Not compliant with BP meds  Considered hypertensive ICH and put on ASA 81 later  Resultant significant language deficit and cognitive impairment  Atrial Fibrillation  Home anticoagulation:  none  On coumadin until 2011 then off. Dr. [place] LionsHochrien considered his Aflutter cured  Hypertension  On coreg, catapres, hydralazine, HCTZ, lisinopril prior to admission - not taking per daughter was noncompliant  Off nicardipine for BP control  Currently BP stable  On coreg 6.25mg  bid, hydralazine 50mg  Q8and lisinopril 20mg  bid for BP control  SBP goal < 160  Long-term BP goal normotensive  Hyperlipidemia  LDL 158, goal < 70  Put on lipitor 20mg   Continue statin at discharge  Diabetes type II  On glucophage at home  HbA1c 6.0  Resume glucophage  Vascular dementia  Baseline dementia  On seroquel and remoron at home  Resume seroquel at bedtime for sundowning.  Dysphagia  Secondary to stroke  Has PEG  and on TF   R knee pain  Hx of gout  R knee pain improved after colchicine   Resume allopurinol, home dose  Other Stroke Risk Factors  Advanced age  Obesity, Body mass index is 30.83 kg/m., recommend weight loss, diet and exercise as appropriate   Coronary artery disease - MI  Noncompliant with medications and medical management   Hospital day # 10  Marvel PlanJindong Landen Breeland, MD PhD Stroke Neurology 07/04/2016 11:46 AM     To contact Stroke Continuity provider, please refer to [email or web address]. After hours, contact General Neurology

## 2016-07-05 LAB — BASIC METABOLIC PANEL
Anion gap: 7 (ref 5–15)
BUN: 23 mg/dL — AB (ref 6–20)
CALCIUM: 8.9 mg/dL (ref 8.9–10.3)
CO2: 29 mmol/L (ref 22–32)
CREATININE: 0.93 mg/dL (ref 0.61–1.24)
Chloride: 107 mmol/L (ref 101–111)
GFR calc Af Amer: >60 mL/min (ref >60)
GLUCOSE: 139 mg/dL — AB (ref 65–99)
Potassium: 3.9 mmol/L (ref 3.5–5.1)
SODIUM: 143 mmol/L (ref 135–145)

## 2016-07-05 LAB — CBC
HCT: 36.6 % — ABNORMAL LOW (ref 39.0–52.0)
Hemoglobin: 11.6 g/dL — ABNORMAL LOW (ref 13.0–17.0)
MCH: 28.8 pg (ref 26.0–34.0)
MCHC: 31.7 g/dL (ref 30.0–36.0)
MCV: 90.8 fL (ref 78.0–100.0)
PLATELETS: 203 10*3/uL (ref 150–400)
RBC: 4.03 MIL/uL — ABNORMAL LOW (ref 4.22–5.81)
RDW: 14.2 % (ref 11.5–15.5)
WBC: 11.6 10*3/uL — ABNORMAL HIGH (ref 4.0–10.5)

## 2016-07-05 LAB — GLUCOSE, CAPILLARY
GLUCOSE-CAPILLARY: 106 mg/dL — AB (ref 65–99)
GLUCOSE-CAPILLARY: 112 mg/dL — AB (ref 65–99)
GLUCOSE-CAPILLARY: 120 mg/dL — AB (ref 65–99)
GLUCOSE-CAPILLARY: 133 mg/dL — AB (ref 65–99)
GLUCOSE-CAPILLARY: 138 mg/dL — AB (ref 65–99)
GLUCOSE-CAPILLARY: 99 mg/dL (ref 65–99)
Glucose-Capillary: 143 mg/dL — ABNORMAL HIGH (ref 65–99)

## 2016-07-05 NOTE — Progress Notes (Signed)
Physical Therapy Treatment Patient Details Name: Austin Sawyer MRN: [id number] DOB: 03/14/1935 Today's Date: 07/05/2016    History of Present Illness Austin CheLeon Balbi is an 81 y.o. male with a flutter, history of stroke, cervicalgia, in remission from ETOH abuse, diabetes, dementia, intracranial hemorrhage, arachnoid hemorrhage back in 2016. Patient presents to the hospital with confusion. Head CT was obtained and showed a acute hemorrhage in the right frontal lobe. Per chart tere is a midline shift that measures up to 4 mm    PT Comments    Pt continues to demonstrate L side neglect and decreased mobility/sensation L side UE/LE. Max a +2 for bed mobility. Current plan remains appropriate.   Follow Up Recommendations  SNF     Equipment Recommendations   (defer to next venue)    Recommendations for Other Services       Precautions / Restrictions Precautions Precautions: Fall Restrictions Weight Bearing Restrictions: No    Mobility  Bed Mobility Overal bed mobility: Needs Assistance;+2 for physical assistance Bed Mobility: Supine to Sit     Supine to sit: +2 for physical assistance;Max assist;HOB elevated Sit to supine: Max assist;+2 for physical assistance   General bed mobility comments: max a +2 to come to EOB with hand over hand cues for use of rail; pt resisted assistance at times and required max multimodal cues to initiate and follow through with task  Transfers                 General transfer comment: pt declined further mobility  Ambulation/Gait                 Stairs            Wheelchair Mobility    Modified Rankin (Stroke Patients Only) Modified Rankin (Stroke Patients Only) Pre-Morbid Rankin Score: No symptoms Modified Rankin: Severe disability     Balance     Sitting balance-Leahy Scale: Zero                              Cognition Arousal/Alertness: Awake/alert Behavior During Therapy: Flat affect;Agitated Overall  Cognitive Status: Impaired/Different from baseline Area of Impairment: Orientation;Attention;Following commands;Safety/judgement;Awareness;Problem solving Orientation Level: Disoriented to;Place;Time;Situation Current Attention Level: Focused Memory: Decreased short-term memory Following Commands: Follows one step commands with increased time;Follows one step commands inconsistently Safety/Judgement: Decreased awareness of safety;Decreased awareness of deficits Awareness: Intellectual Problem Solving: Slow processing;Decreased initiation;Requires verbal cues;Requires tactile cues General Comments: pt with tangential speech and answered inappropriately to ~50% of questions    Exercises      General Comments        Pertinent Vitals/Pain Pain Assessment: Faces Faces Pain Scale: No hurt Pain Intervention(s): Monitored during session    Home Living                      Prior Function            PT Goals (current goals can now be found in the care plan section) Acute Rehab PT Goals Patient Stated Goal: none stated Progress towards PT goals: Not progressing toward goals - comment    Frequency    Min 3X/week      PT Plan Current plan remains appropriate    Co-evaluation             End of Session   Activity Tolerance: Patient tolerated treatment well;Treatment limited secondary to agitation Patient left: with call bell/phone within reach;in bed;with  bed alarm set;with SCD's reapplied     Time: 1447-1510 PT Time Calculation (min) (ACUTE ONLY): 23 min  Charges:  $Therapeutic Activity: 23-37 mins                    G Codes:      Derek Mound, PTA Pager: [phone]   07/05/2016, 4:07 PM

## 2016-07-05 NOTE — Care Management Important Message (Signed)
Important Message  Patient Details  Name: Austin Sawyer MRN: [id number] Date of Birth: 09/26/1935   Medicare Important Message Given:  Yes    Baldemar Dady 07/05/2016, 1:18 PM

## 2016-07-05 NOTE — Progress Notes (Signed)
Speech Language Pathology Treatment: Dysphagia;Cognitive-Linquistic  Patient Details Name: Austin Sawyer MRN: [id number] DOB: 06/13/1935 Today's Date: 07/05/2016 Time: [id number] SLP Time Calculation (min) (ACUTE ONLY): 26 min  Assessment / Plan / Recommendation Clinical Impression  Skilled SLP treatment focused on dysphagia and cognition *attention.  Pt awake in bed, required max cues to follow directions for oral care and acceptance of ice chip.  Delayed processing noted but with time and repetition, pt able to follow approximately 75% of one step commands.  Voice was clear at baseline with lingual whitish coating that was removed with extensive oral care.  Ice chip administered with pt demonstrating excessive oral holding - requiring dry spoon pressure to tongue to trigger swallow.  No indication of aspiration noted however swallow is inadequate to allow safe and adequate nutrition.  He only accepted a single ice chip, declining further offerings.     Pt will benefit from aggressive SLP treatment for cognitive linguistic skills and swallowing ability.  No family present but educated patient.    HPI HPI: [name] an 81 y.o.malewith a flutter,history of stroke, cervicalgia, in remission from ETOH abuse, diabetes, dementia, intracranial hemorrhage, arachnoid hemorrhage back in 2016. Patient presents to the hospital with confusion. Head CT was obtained and showed a acute hemorrhage in the right frontal lobe. Per chart there is a midline shift that measures up to 4 mm. BSE 02/16/15 following stroke recommended NPO. Pt able to initiate Dys 1, thin 2 days later and upgraded to Dys 2 during that admission.      SLP Plan  Continue with current plan of care     Recommendations  Diet recommendations: Other(comment) (ice chips only with SLP/RN) Liquids provided via: Teaspoon Medication Administration: Via alternative means Postural Changes and/or Swallow Maneuvers: Seated upright 90 degrees;Upright  30-60 min after meal                Oral Care Recommendations: Oral care QID Follow up Recommendations: Skilled Nursing facility Plan: Continue with current plan of care       GO                Mills KollerKimball, Kamica Florance Ann Jabes Primo, MS [facility] SLP [phone]

## 2016-07-05 NOTE — Progress Notes (Signed)
STROKE TEAM PROGRESS NOTE   SUBJECTIVE (INTERVAL HISTORY) No family at bedside today. Tube feeding continues. R knee pain resolved. Pending discharge to SNF.     OBJECTIVE Temp:  [97.3 F (36.3 C)-99.8 F (37.7 C)] 97.3 F (36.3 C) (10/02 1442) Pulse Rate:  [74-82] 74 (10/02 1442) Cardiac Rhythm: Normal sinus rhythm (10/02 0700) Resp:  [16-20] 16 (10/02 1442) BP: (109-157)/(68-89) 109/89 (10/02 1442) SpO2:  [96 %-99 %] 98 % (10/02 1442) Weight:  [200 lb 13.4 oz (91.1 kg)] 200 lb 13.4 oz (91.1 kg) (10/02 0500)  CBC:   Recent Labs Lab 07/04/16 0610 07/05/16 0633  WBC 6.4 11.6*  HGB 12.0* 11.6*  HCT 33.4* 36.6*  MCV 90.5 90.8  PLT 393 203    Basic Metabolic Panel:   Recent Labs Lab 07/04/16 0610 07/05/16 0633  NA 141 143  K 4.2 3.9  CL 105 107  CO2 26 29  GLUCOSE 127* 139*  BUN 24* 23*  CREATININE 0.95 0.93  CALCIUM 8.8* 8.9    Lipid Panel:     Component Value Date/Time   CHOL 216 (H) 06/28/2016 0347   TRIG 114 06/28/2016 0347   HDL 35 (L) 06/28/2016 0347   CHOLHDL 6.2 06/28/2016 0347   VLDL 23 06/28/2016 0347   LDLCALC 158 (H) 06/28/2016 0347   HgbA1c:  Lab Results  Component Value Date   HGBA1C 6.0 (H) 02/09/2015   Urine Drug Screen:     Component Value Date/Time   LABOPIA NONE DETECTED 02/08/2015 1823   COCAINSCRNUR NONE DETECTED 02/08/2015 1823   LABBENZ NONE DETECTED 02/08/2015 1823   AMPHETMU NONE DETECTED 02/08/2015 1823   THCU NONE DETECTED 02/08/2015 1823   LABBARB NONE DETECTED 02/08/2015 1823      IMAGING I have personally reviewed the radiological images below and agree with the radiology interpretations.  Ct Head Wo Contrast 06/24/2016 1. ~32 cc right frontal acute parenchymal hematoma. Mild local subarachnoid extension. 4 mm midline shift.  2. Gliosis from remote left occipital lobar hemorrhage in 2016. Possible amyloid angiopathy, but no typical chronic hemorrhagic foci on 2016 brain MRI. Question hemorrhagic conversion of  infarct.   06/30/2016 IMPRESSION: Large right frontal hematoma unchanged. 4 mm of midline shift unchanged. No new findings.     Mr Brain Wo Contrast  06/26/2016 Moderate to severely motion degraded examination. Acute RIGHT frontal lobe 2.8 x 5.9 x 3.7 cm hematoma, resulting in mild RIGHT to LEFT midline shift. Old LEFT occipital lobe hemorrhagic infarct.  EEG 06/25/2016 IMPRESSION: This is an abnormal EEG demonstrating a mild diffuse slowing of electrocerebral activity.  This can be seen in a wide variety of encephalopathic state including those of a toxic, metabolic, or degenerative nature.  There were no focal, hemispheric, or lateralizing features.  No epileptiform activity was recorded.  TTE  - Left ventricle: The cavity size was normal. There was moderate concentric hypertrophy. Systolic function was normal. The estimated ejection fraction was in the range of 60% to 65%. Diffuse hypokinesis. Doppler parameters are consistent with abnormal left ventricular relaxation (grade 1 diastolic dysfunction). Doppler parameters are consistent with elevated ventricular end-diastolic filling pressure. - Ventricular septum: Septal motion showed paradox. - Aortic valve: There was mild stenosis. Valve area (VTI): 2.48 cm^2. Valve area (Vmax): 2.06 cm^2. Valve area (Vmean): 2.2 cm^2. - Aortic root: The aortic root was normal in size. - Mitral valve: Structurally normal valve. - Right ventricle: The cavity size was normal. Wall thickness was normal. Systolic function was normal. - Tricuspid valve: There  was no regurgitation. - Pulmonary arteries: Systolic pressure could not be accurately estimated. - Inferior vena cava: The vessel was normal in size. - Pericardium, extracardiac: There was no pericardial effusion. Impressions: - Poor quality study with limited endocardial visualization, however there appears to be normal biventricular function.  PHYSICAL EXAM  Temp:  [97.3 F (36.3 C)-99.8 F (37.7  C)] 97.3 F (36.3 C) (10/02 1442) Pulse Rate:  [74-82] 74 (10/02 1442) Resp:  [16-20] 16 (10/02 1442) BP: (109-157)/(68-89) 109/89 (10/02 1442) SpO2:  [96 %-99 %] 98 % (10/02 1442) Weight:  [200 lb 13.4 oz (91.1 kg)] 200 lb 13.4 oz (91.1 kg) (10/02 0500)  General - Well nourished, well developed, in no apparent distress.  Ophthalmologic - Fundi not visualized due to noncooperation.  Cardiovascular - Regular rate and rhythm.  Mental Status -  Level of arousal and orientation to self, but not orientated to place, time or people. Language exam showed paucity of speech, able to follow simple commands after several requests, not able to name but able to repeat simple sentences. Left neglect.  Cranial Nerves II - XII - II - Visual field exam showed left visual field neglect. III, IV, VI - right gaze preference. V - Facial sensation intact bilaterally. VII - left facial droop. VIII - Hearing & vestibular intact bilaterally. X - Palate elevates symmetrically. XI - Chin turning & shoulder shrug intact bilaterally. XII - Tongue protrusion to the left.  Motor Strength - The patient's strength was 5/5 RUE, RLE pain at knee improved, LUE 2/5 with pain stimulation, LLE 4/5.  Bulk was normal and fasciculations were absent.   Motor Tone - Muscle tone was assessed at the neck and appendages and was normal.  Reflexes - The patient's reflexes were 1+ in all extremities and he had bilateral babinski reflexes.  Sensory - Light touch, temperature/pinprick were assessed and were symmetrical.    Coordination - no cooperative on test.  Tremor was absent.  Gait and Station - not tested due to safety concerns   ASSESSMENT/PLAN [name] is a 81 y.o. male with history of HTN, diabetes, CAD, dementia, cured atrial flutter not [facility], alcohol abuse, previous lobar ICH presenting with confusion, "problems" getting around x 1 week. CT showed a R frontal ICH with midline shift. Noncompliant with  medications, not taking his HTN medications.   Stroke:  right frontal ICH with 4mm midline shift in setting of hypertension, etiology could be due to hypertensive due to noncompliance or CAA given recurrent lobar ICHs  Resultant HA, baseline confusion  CT - right frontal ICH  MRI - Acute RIGHT frontal lobe 2.8 x 5.9 x 3.7 cm hematoma, stable size  Repeat CT - stable hematoma and increased edema  EEG - mild general slowing, no seizure - d/c keppra  2D echo - EF 60-65%  Heparin subq for VTE prophylaxis  aspirin 81 mg daily prior to admission, now on No antithrombotic given hemorrhage. Due to concern of CAA etiology, will not recommend antiplatelet in the future.  Ongoing aggressive stroke risk factor management  Therapy recommendations: SNF  Disposition:  SNF   Medically ready for discharge to SNF once bed available  History of lobar ICH  02/2015 left parietal occipital ICH/IVH/SAH/SDH  BP high and controlled with 4 BP meds at discharge  Not compliant with BP meds  Considered hypertensive ICH and put on ASA 81 later  Resultant significant language deficit and cognitive impairment  Atrial Fibrillation  Home anticoagulation:  none  On coumadin until 2011 then off. Dr. Charles Lions considered his Aflutter cured  Hypertension  On coreg, catapres, hydralazine, HCTZ, lisinopril prior to admission - not taking per daughter was noncompliant  Off nicardipine for BP control  Currently BP stable  On coreg 6.25mg  bid, hydralazine 50mg  Q8and lisinopril 20mg  bid for BP control  SBP goal < 160  Long-term BP goal normotensive  Hyperlipidemia  LDL 158, goal < 70  Put on lipitor 20mg   Continue statin at discharge  Diabetes type II  On glucophage at home  HbA1c 6.0  Resume glucophage  Vascular dementia  Baseline dementia  On seroquel and remoron at home  Resumed seroquel at bedtime for sundowning.  Dysphagia  Secondary to stroke  Has PEG and on TF   R  knee pain  Hx of gout  R knee pain improved after colchicine   Resume allopurinol, home dose  Other Stroke Risk Factors  Advanced age  Obesity, Body mass index is 29.66 kg/m., recommend weight loss, diet and exercise as appropriate   Coronary artery disease - MI  Noncompliant with medications and medical management   Hospital day # 11  Marvel Plan, MD PhD Stroke Neurology 07/05/2016 2:46 PM     To contact Stroke Continuity provider, please refer to [email or web address]. After hours, contact General Neurology

## 2016-07-05 NOTE — Progress Notes (Signed)
Nutrition Follow-up  DOCUMENTATION CODES:   Obesity unspecified  INTERVENTION:  Continue Jevity 1.2 @ 65 ml/hr with 30 ml Pro-stat BID Provides: 2072 kcal, 116 grams protein, and 1263 ml H2O.   NUTRITION DIAGNOSIS:   Inadequate oral intake related to dysphagia as evidenced by NPO status.  Ongoing  GOAL:   Patient will meet greater than or equal to 90% of their needs  Being met  MONITOR:   TF tolerance, I & O's, Labs  REASON FOR ASSESSMENT:   Consult Enteral/tube feeding initiation and management  ASSESSMENT:   Austin Sawyer is an 81 y.o. male with a flutter, history of stroke, cervicalgia, in remission from ETOH abuse, diabetes, dementia, intracranial hemorrhage, arachnoid hemorrhage back in 2016. Patient presents to the hospital with confusion. Head CT was obtained and showed a acute hemorrhage in the right frontal lobe. Per chart there is a midline shift that measures up to 4 mm.  Pt asleep at time of visit. No TF infusing at time of visit. Pt had PEG placed at the end of last week and remains NPO. Per RN, pt has been tolerating TF well and new container of Jevity 1.2 about to be set up. Pt's weight is down 4 lbs in the past 11 days.   Labs reviewed.   Diet Order:     Skin:  Reviewed, no issues  Last BM:  10/1  Height:   Ht Readings from Last 1 Encounters:  06/30/16 _0  (1.753 m)    Weight:   Wt Readings from Last 1 Encounters:  07/05/16 200 lb 13.4 oz (91.1 kg)    Ideal Body Weight:  72.7 kg  BMI:  Body mass index is 29.66 kg/m.  Estimated Nutritional Needs:   Kcal:  2000-2200  Protein:  110-120 grams  Fluid:  > 2 l/day  EDUCATION NEEDS:   No education needs identified at this time  [place], CSP, LDN Inpatient Clinical Dietitian Pager: [phone] After Hours Pager: [phone]

## 2016-07-06 DIAGNOSIS — K5900 Constipation, unspecified: Secondary | ICD-10-CM | POA: Diagnosis not present

## 2016-07-06 DIAGNOSIS — Z818 Family history of other mental and behavioral disorders: Secondary | ICD-10-CM | POA: Diagnosis not present

## 2016-07-06 DIAGNOSIS — I618 Other nontraumatic intracerebral hemorrhage: Secondary | ICD-10-CM | POA: Diagnosis not present

## 2016-07-06 DIAGNOSIS — M6281 Muscle weakness (generalized): Secondary | ICD-10-CM | POA: Diagnosis not present

## 2016-07-06 DIAGNOSIS — R5381 Other malaise: Secondary | ICD-10-CM | POA: Diagnosis not present

## 2016-07-06 DIAGNOSIS — I5022 Chronic systolic (congestive) heart failure: Secondary | ICD-10-CM | POA: Diagnosis not present

## 2016-07-06 DIAGNOSIS — I69391 Dysphagia following cerebral infarction: Secondary | ICD-10-CM | POA: Diagnosis not present

## 2016-07-06 DIAGNOSIS — H9190 Unspecified hearing loss, unspecified ear: Secondary | ICD-10-CM | POA: Diagnosis present

## 2016-07-06 DIAGNOSIS — Z5189 Encounter for other specified aftercare: Secondary | ICD-10-CM | POA: Diagnosis not present

## 2016-07-06 DIAGNOSIS — E1142 Type 2 diabetes mellitus with diabetic polyneuropathy: Secondary | ICD-10-CM | POA: Diagnosis not present

## 2016-07-06 DIAGNOSIS — R414 Neurologic neglect syndrome: Secondary | ICD-10-CM | POA: Diagnosis present

## 2016-07-06 DIAGNOSIS — R4182 Altered mental status, unspecified: Secondary | ICD-10-CM | POA: Diagnosis not present

## 2016-07-06 DIAGNOSIS — G8110 Spastic hemiplegia affecting unspecified side: Secondary | ICD-10-CM | POA: Diagnosis not present

## 2016-07-06 DIAGNOSIS — F0151 Vascular dementia with behavioral disturbance: Secondary | ICD-10-CM | POA: Diagnosis not present

## 2016-07-06 DIAGNOSIS — I6921 Attention and concentration deficit following other nontraumatic intracranial hemorrhage: Secondary | ICD-10-CM | POA: Diagnosis not present

## 2016-07-06 DIAGNOSIS — Z431 Encounter for attention to gastrostomy: Secondary | ICD-10-CM | POA: Diagnosis not present

## 2016-07-06 DIAGNOSIS — F0150 Vascular dementia without behavioral disturbance: Secondary | ICD-10-CM | POA: Diagnosis not present

## 2016-07-06 DIAGNOSIS — K9423 Gastrostomy malfunction: Secondary | ICD-10-CM | POA: Diagnosis not present

## 2016-07-06 DIAGNOSIS — I4891 Unspecified atrial fibrillation: Secondary | ICD-10-CM | POA: Diagnosis present

## 2016-07-06 DIAGNOSIS — I619 Nontraumatic intracerebral hemorrhage, unspecified: Secondary | ICD-10-CM | POA: Diagnosis present

## 2016-07-06 DIAGNOSIS — R488 Other symbolic dysfunctions: Secondary | ICD-10-CM | POA: Diagnosis not present

## 2016-07-06 DIAGNOSIS — R1310 Dysphagia, unspecified: Secondary | ICD-10-CM | POA: Diagnosis not present

## 2016-07-06 DIAGNOSIS — Z7982 Long term (current) use of aspirin: Secondary | ICD-10-CM | POA: Diagnosis not present

## 2016-07-06 DIAGNOSIS — E119 Type 2 diabetes mellitus without complications: Secondary | ICD-10-CM | POA: Diagnosis present

## 2016-07-06 DIAGNOSIS — R1319 Other dysphagia: Secondary | ICD-10-CM | POA: Diagnosis present

## 2016-07-06 DIAGNOSIS — R1312 Dysphagia, oropharyngeal phase: Secondary | ICD-10-CM | POA: Diagnosis not present

## 2016-07-06 DIAGNOSIS — Z8673 Personal history of transient ischemic attack (TIA), and cerebral infarction without residual deficits: Secondary | ICD-10-CM | POA: Diagnosis not present

## 2016-07-06 DIAGNOSIS — I610 Nontraumatic intracerebral hemorrhage in hemisphere, subcortical: Secondary | ICD-10-CM | POA: Diagnosis not present

## 2016-07-06 DIAGNOSIS — E441 Mild protein-calorie malnutrition: Secondary | ICD-10-CM | POA: Diagnosis not present

## 2016-07-06 DIAGNOSIS — I110 Hypertensive heart disease with heart failure: Secondary | ICD-10-CM | POA: Diagnosis present

## 2016-07-06 DIAGNOSIS — R278 Other lack of coordination: Secondary | ICD-10-CM | POA: Diagnosis not present

## 2016-07-06 DIAGNOSIS — D72829 Elevated white blood cell count, unspecified: Secondary | ICD-10-CM | POA: Diagnosis not present

## 2016-07-06 DIAGNOSIS — E785 Hyperlipidemia, unspecified: Secondary | ICD-10-CM | POA: Diagnosis not present

## 2016-07-06 DIAGNOSIS — E782 Mixed hyperlipidemia: Secondary | ICD-10-CM

## 2016-07-06 DIAGNOSIS — R41841 Cognitive communication deficit: Secondary | ICD-10-CM | POA: Diagnosis not present

## 2016-07-06 DIAGNOSIS — I6789 Other cerebrovascular disease: Secondary | ICD-10-CM | POA: Diagnosis not present

## 2016-07-06 DIAGNOSIS — M109 Gout, unspecified: Secondary | ICD-10-CM | POA: Diagnosis not present

## 2016-07-06 DIAGNOSIS — I10 Essential (primary) hypertension: Secondary | ICD-10-CM | POA: Diagnosis not present

## 2016-07-06 DIAGNOSIS — I69159 Hemiplegia and hemiparesis following nontraumatic intracerebral hemorrhage affecting unspecified side: Secondary | ICD-10-CM | POA: Diagnosis not present

## 2016-07-06 DIAGNOSIS — D72828 Other elevated white blood cell count: Secondary | ICD-10-CM | POA: Diagnosis not present

## 2016-07-06 DIAGNOSIS — I161 Hypertensive emergency: Secondary | ICD-10-CM | POA: Diagnosis not present

## 2016-07-06 DIAGNOSIS — I69954 Hemiplegia and hemiparesis following unspecified cerebrovascular disease affecting left non-dominant side: Secondary | ICD-10-CM | POA: Diagnosis not present

## 2016-07-06 DIAGNOSIS — G629 Polyneuropathy, unspecified: Secondary | ICD-10-CM | POA: Diagnosis not present

## 2016-07-06 DIAGNOSIS — R2689 Other abnormalities of gait and mobility: Secondary | ICD-10-CM | POA: Diagnosis not present

## 2016-07-06 DIAGNOSIS — I6911 Attention and concentration deficit following nontraumatic intracerebral hemorrhage: Secondary | ICD-10-CM | POA: Diagnosis not present

## 2016-07-06 DIAGNOSIS — N390 Urinary tract infection, site not specified: Secondary | ICD-10-CM | POA: Diagnosis not present

## 2016-07-06 DIAGNOSIS — I69191 Dysphagia following nontraumatic intracerebral hemorrhage: Secondary | ICD-10-CM | POA: Diagnosis not present

## 2016-07-06 DIAGNOSIS — G4730 Sleep apnea, unspecified: Secondary | ICD-10-CM | POA: Diagnosis present

## 2016-07-06 DIAGNOSIS — R633 Feeding difficulties: Secondary | ICD-10-CM | POA: Diagnosis not present

## 2016-07-06 DIAGNOSIS — Z7984 Long term (current) use of oral hypoglycemic drugs: Secondary | ICD-10-CM | POA: Diagnosis not present

## 2016-07-06 DIAGNOSIS — T859XXA Unspecified complication of internal prosthetic device, implant and graft, initial encounter: Secondary | ICD-10-CM | POA: Diagnosis not present

## 2016-07-06 DIAGNOSIS — I611 Nontraumatic intracerebral hemorrhage in hemisphere, cortical: Secondary | ICD-10-CM | POA: Diagnosis present

## 2016-07-06 DIAGNOSIS — Z23 Encounter for immunization: Secondary | ICD-10-CM | POA: Diagnosis not present

## 2016-07-06 DIAGNOSIS — Z4682 Encounter for fitting and adjustment of non-vascular catheter: Secondary | ICD-10-CM | POA: Diagnosis not present

## 2016-07-06 DIAGNOSIS — M79602 Pain in left arm: Secondary | ICD-10-CM | POA: Diagnosis not present

## 2016-07-06 DIAGNOSIS — I2510 Atherosclerotic heart disease of native coronary artery without angina pectoris: Secondary | ICD-10-CM | POA: Diagnosis present

## 2016-07-06 LAB — GLUCOSE, CAPILLARY
GLUCOSE-CAPILLARY: 104 mg/dL — AB (ref 65–99)
GLUCOSE-CAPILLARY: 120 mg/dL — AB (ref 65–99)
Glucose-Capillary: 119 mg/dL — ABNORMAL HIGH (ref 65–99)
Glucose-Capillary: 136 mg/dL — ABNORMAL HIGH (ref 65–99)

## 2016-07-06 MED ORDER — LISINOPRIL 20 MG PO TABS: 20.0000 mg | ORAL_TABLET | Freq: Two times a day (BID) | ORAL | 1 refills | Status: DC

## 2016-07-06 MED ORDER — CARVEDILOL 6.25 MG PO TABS: 6.2500 mg | ORAL_TABLET | Freq: Two times a day (BID) | ORAL | Status: DC

## 2016-07-06 NOTE — Clinical Social Work Note (Signed)
Pt is [facility] for [facility] today [facility]. PASARR has been obtained. Facility has received discharge information and is ready to admit pt. RN call report. PTAR will provide transportation. CSW also followed up with APS. Per APS intake worker, no report was filed over the weekend. CSW provided information regarding concerns. Due to pt going to a safe facility, there is no need for protection services. CSW updated facility of reported concerns, and facility will follow up with APS if needed. CSW updated MD. CSW left a message with wife regarding pt's discharge. Per facility, they will be calling pt's wife when [facility]. CSW is singing off as no further needs identified.   Dede QuerySarah Essynce Munsch, MSW, LCSW  Clinical Social Worker  [phone]

## 2016-07-06 NOTE — Progress Notes (Signed)
[facility] being [facility]. RN called and gave report to [place]. Pt's family has also been notified to transfer. Pt's IV will be removed prior to transfer.

## 2016-07-06 NOTE — Progress Notes (Signed)
PTAR here picking up patient. Stopped Jevity feedings and flushed PEG. IV removed from Rt forearm . Dentured and glasses sent in bag with PTAR.

## 2016-07-06 NOTE — Clinical Social Work Placement (Signed)
   CLINICAL SOCIAL WORK PLACEMENT  NOTE  Date:  07/06/2016  Patient Details  Name: Austin Sawyer MRN: [id number] Date of Birth: 06/19/1935  Clinical Social Work is seeking post-discharge placement for this patient at the Skilled  Nursing Facility level of care (*CSW will initial, date and re-position this form in  chart as items are completed):  Yes   Patient/family provided with [facility] Clinical Social Work Department's list of facilities offering this level of care within the geographic area requested by the patient (or if unable, by the patient's family).  Yes   Patient/family informed of their freedom to choose among providers that offer the needed level of care, that participate in Medicare, Medicaid or managed care program needed by the patient, have an available bed and are willing to accept the patient.      Patient/family informed of 's ownership interest in [facility] and [facility], as well as of the fact that they are under no obligation to receive care at these facilities.  PASRR submitted to EDS on 06/30/16     PASRR number received on 07/06/16     Existing PASRR number confirmed on       FL2 transmitted to all facilities in geographic area requested by pt/family on 06/30/16     FL2 transmitted to all facilities within larger geographic area on       Patient informed that his/her managed care company has contracts with or will negotiate with certain facilities, including the following:        Yes   Patient/family informed of bed offers received.  [facility] bed [facility]     Physician recommends and patient chooses bed at      [facility].  Patient to be transferred to facility by PTAR     Patient family notified on 07/06/16 of transfer.  Name of family member notified:  Pt's wife.     PHYSICIAN Please prepare priority discharge summary, including medications, Please prepare  prescriptions, Please sign FL2     Additional Comment:    _______________________________________________ Dede QuerySarah Gal Feldhaus, LCSW 07/06/2016, 2:03 PM

## 2016-07-06 NOTE — Clinical Social Work Note (Signed)
On 07/05/2016, [facility] is down therefore, unable to verify PASARR. Unable to discharge pt. CSW [facility] as well as MD. CSW will continue to follow.   Dede QuerySarah Dragon Thrush, MSW, LCSW  Clinical Social Worker  [phone]

## 2016-07-07 ENCOUNTER — Non-Acute Institutional Stay (SKILLED_NURSING_FACILITY): Payer: Medicare Other | Admitting: Internal Medicine

## 2016-07-07 ENCOUNTER — Telehealth: Payer: Self-pay | Admitting: Neurology

## 2016-07-07 ENCOUNTER — Encounter: Payer: Self-pay | Admitting: Internal Medicine

## 2016-07-07 ENCOUNTER — Encounter: Payer: Self-pay | Admitting: Neurology

## 2016-07-07 DIAGNOSIS — I618 Other nontraumatic intracerebral hemorrhage: Secondary | ICD-10-CM | POA: Diagnosis not present

## 2016-07-07 DIAGNOSIS — E441 Mild protein-calorie malnutrition: Secondary | ICD-10-CM

## 2016-07-07 DIAGNOSIS — F0151 Vascular dementia with behavioral disturbance: Secondary | ICD-10-CM | POA: Diagnosis not present

## 2016-07-07 DIAGNOSIS — F01518 Vascular dementia, unspecified severity, with other behavioral disturbance: Secondary | ICD-10-CM

## 2016-07-07 DIAGNOSIS — D72828 Other elevated white blood cell count: Secondary | ICD-10-CM

## 2016-07-07 DIAGNOSIS — D62 Acute posthemorrhagic anemia: Secondary | ICD-10-CM

## 2016-07-07 DIAGNOSIS — M109 Gout, unspecified: Secondary | ICD-10-CM

## 2016-07-07 DIAGNOSIS — I10 Essential (primary) hypertension: Secondary | ICD-10-CM | POA: Diagnosis not present

## 2016-07-07 DIAGNOSIS — F329 Major depressive disorder, single episode, unspecified: Secondary | ICD-10-CM

## 2016-07-07 DIAGNOSIS — I5022 Chronic systolic (congestive) heart failure: Secondary | ICD-10-CM

## 2016-07-07 DIAGNOSIS — K5900 Constipation, unspecified: Secondary | ICD-10-CM

## 2016-07-07 DIAGNOSIS — I69391 Dysphagia following cerebral infarction: Secondary | ICD-10-CM

## 2016-07-07 DIAGNOSIS — E785 Hyperlipidemia, unspecified: Secondary | ICD-10-CM | POA: Diagnosis not present

## 2016-07-07 DIAGNOSIS — E1142 Type 2 diabetes mellitus with diabetic polyneuropathy: Secondary | ICD-10-CM

## 2016-07-07 DIAGNOSIS — N400 Enlarged prostate without lower urinary tract symptoms: Secondary | ICD-10-CM

## 2016-07-07 DIAGNOSIS — R5381 Other malaise: Secondary | ICD-10-CM | POA: Diagnosis not present

## 2016-07-07 NOTE — Telephone Encounter (Addendum)
Wife called to request that Dr. Roda ShuttersXu send fax to her lawyer, Hulen ShoutsKevin Gorham "so he can know my husband's condition, daughter is trying to take her to court so that she can be guardian, says she (wife) is not taking care of him" states husband had another bad stroke end of September, son took patient over to visit daughter in [place] around September 9th (that he hasn't had contact with in over 18 years), he stayed for 4 weeks, then went to [place], wife states husband is not able to take care of business and daughter was having patient take out large sums of money out of his bank in [place], states husband came back from [place], [facility], then [facility]. Wife adds, she has 1st hearing on Monday October 9th then hearing for Guardianship on October 28th.

## 2016-07-07 NOTE — Telephone Encounter (Signed)
I called wife and she needs a letter to state pt current condition. She will come in am to pick it up. I have wrote one, and put on Katrina's desk and it is ready for pick up.   Marvel PlanJindong Jehan Bonano, MD PhD Stroke Neurology 07/07/2016 6:23 PM

## 2016-07-07 NOTE — Progress Notes (Signed)
LOCATION: [facility]  PCP: No PCP Per Patient   Code Status: Full Code  Goals of care: Advanced Directive information Advanced Directives 06/24/2016  Does patient have an advance directive? Yes  Type of Advance Directive Healthcare Power of Attorney  Does patient want to make changes to advanced directive? No - Patient declined  Copy of advanced directive(s) in chart? Yes  Would patient like information on creating an advanced directive? -  Pre-existing out of facility DNR order (yellow form or pink MOST form) -  Some encounter information is confidential and restricted. Go to Review Flowsheets activity to see all data.       Extended Emergency Contact Information Primary Emergency Contact: Ellerbrock,Thelma Address: [address]          [place], [place] [address] [place] of [place] Home Phone: [phone] Relation: Spouse Secondary Emergency Contact: Mellody Dance [place] of [place] Home Phone: [phone] Relation: Son   No Known Allergies  Chief Complaint  Patient presents with  . New Admit To SNF    New Admission Visit     HPI:  Patient is a 81 y.o. male seen today for short term rehabilitation post hospital admission from 06/24/16-07/06/16 with right frontal intracranial hemorrhage with midline shift. This was felt to be from uncontrolled HTN. He was seen by neurology service. He also had dysphagia and underwent PEG tube placement. He was treated for gout flare with colchicine. He has PMH of HTN, CAD, DM, dementia, previous lobar ICH among others. He is able to say few words but unable to obtain HPI or ROS from him.    Review of Systems: unable to obtain   Past Medical History:  Diagnosis Date  . Alcohol abuse, in remission   . Atrial flutter (HCC)   . Back pain   . Cervicalgia   . Coronary artery disease   . Coronary atherosclerosis of unspecified type of vessel, native or graft   . Dementia   . Depression   . Diabetes mellitus  without complication (HCC)   . Disturbance of skin sensation   . Hypertension   . MI, old   . Reflux   . Stroke (HCC)   . Unspecified hearing loss   . Unspecified sleep apnea    Past Surgical History:  Procedure Laterality Date  . HERNIA REPAIR    . PEG PLACEMENT N/A 07/02/2016   Procedure: PERCUTANEOUS ENDOSCOPIC GASTROSTOMY (PEG) PLACEMENT;  Surgeon: Violeta Gelinas, MD;  Location: [facility] ENDOSCOPY;  Service: Endoscopy;  Laterality: N/A;  . ROTATOR CUFF REPAIR     Social History:   reports that he has quit smoking. He has never used smokeless tobacco. He reports that he does not drink alcohol or use drugs.  Family History  Problem Relation Age of Onset  . Anxiety disorder Mother   . Cancer Mother     Medications:   Medication List       Accurate as of 07/07/16 11:44 AM. Always use your most recent med list.          albuterol (2.5 MG/3ML) 0.083% nebulizer solution Commonly known as:  PROVENTIL Take 3 mLs (2.5 mg total) by nebulization 2 (two) times daily as needed for wheezing or shortness of breath.   allopurinol 300 MG tablet Commonly known as:  ZYLOPRIM Take 300 mg by mouth daily. For gout   atorvastatin 20 MG tablet Commonly known as:  LIPITOR Take 1 tablet (20 mg total) by mouth daily at 6 PM.   carvedilol  6.25 MG tablet Commonly known as:  COREG Take 1 tablet (6.25 mg total) by mouth 2 (two) times daily with a meal.   DULoxetine HCl 40 MG Cpep Take 40 mg by mouth daily.   feeding supplement (JEVITY 1.2 CAL) Liqd Place 1,000 mLs into feeding tube continuous.   feeding supplement (PRO-STAT SUGAR FREE 64) Liqd Place 30 mLs into feeding tube 2 (two) times daily.   gabapentin 250 MG/5ML solution Commonly known as:  NEURONTIN Place 6 mLs (300 mg total) into feeding tube daily.   hydrALAZINE 50 MG tablet Commonly known as:  APRESOLINE Take 50 mg by mouth 3 (three) times daily.   lisinopril 20 MG tablet Commonly known as:  PRINIVIL,ZESTRIL Take 1 tablet  (20 mg total) by mouth 2 (two) times daily.   metFORMIN 500 MG tablet Commonly known as:  GLUCOPHAGE Take 500 mg by mouth daily.   nitroGLYCERIN 0.4 MG SL tablet Commonly known as:  NITROSTAT Place 0.4 mg under the tongue every 5 (five) minutes as needed. Chest pain   QUEtiapine 25 MG tablet Commonly known as:  SEROQUEL Take 1 tablet (25 mg total) by mouth at bedtime.   senna-docusate 8.6-50 MG tablet Commonly known as:  Senokot-S Take 1 tablet by mouth 2 (two) times daily.   tamsulosin 0.4 MG Caps capsule Commonly known as:  FLOMAX Take 0.4 mg by mouth daily.       Immunizations: Immunization History  Administered Date(s) Administered  . Influenza,inj,Quad PF,36+ Mos 06/25/2016  . Pneumococcal Polysaccharide-23 07/05/2003     Physical Exam:  Vitals:   07/07/16 1140  BP: 130/84  Pulse: 73  Resp: 20  Temp: 98.9 F (37.2 C)  TempSrc: Oral  SpO2: 98%  Weight: 210 lb (95.3 kg)  Height: 5\' 9"  (1.753 m)   Body mass index is 31.01 kg/m.  General- elderly obese male in no acute distress Head- normocephalic, atraumatic Throat- moist mucus membrane  Eyes- no pallor, no icterus, no discharge Neck- no cervical lymphadenopathy Cardiovascular- normal s1,s2, no murmur Respiratory- bilateral clear to auscultation, no wheeze, no rhonchi, no crackles, no use of accessory muscles Abdomen- bowel sounds present, soft, non tender, peg tube in place with site clean Musculoskeletal- able to move his RUE and RLE with strength 5/5, able to move his LLE with strength 4/5, LUE strength 2-3/5 Neurological- unable to assess with patient not following commands, left facial droop present Skin- warm and dry    Labs reviewed: Basic Metabolic Panel:  Recent Labs  24/40/1009/30/17 0844 07/04/16 0610 07/05/16 0633  NA 140 141 143  K 3.9 4.2 3.9  CL 104 105 107  CO2 25 26 29   GLUCOSE 109* 127* 139*  BUN 22* 24* 23*  CREATININE 0.89 0.95 0.93  CALCIUM 9.0 8.8* 8.9   Liver Function  Tests:  Recent Labs  01/09/16 1033 06/24/16 1352  AST 18 19  ALT 15* 17  ALKPHOS 54 64  BILITOT 0.8 0.7  PROT 7.1 8.1  ALBUMIN 3.8 4.2   No results for input(s): LIPASE, AMYLASE in the last 8760 hours. No results for input(s): AMMONIA in the last 8760 hours. CBC:  Recent Labs  01/09/16 1033  06/24/16 1352  07/03/16 0844 07/04/16 0610 07/05/16 0633  WBC 7.7  --  13.1*  < > 12.0* 6.4 11.6*  NEUTROABS 5.6  --  10.6*  --   --   --   --   HGB 13.1  < > 13.9  < > 12.5* 12.0* 11.6*  HCT 40.9  < >  42.4  < > 39.1 33.4* 36.6*  MCV 88.7  --  90.2  < > 92.0 90.5 90.8  PLT 161  --  202  < > 180 393 203  < > = values in this interval not displayed. Cardiac Enzymes: No results for input(s): CKTOTAL, CKMB, CKMBINDEX, TROPONINI in the last 8760 hours. BNP: Invalid input(s): POCBNP CBG:  Recent Labs  07/06/16 0758 07/06/16 1218 07/06/16 1625  GLUCAP 136* 104* 120*    Radiological Exams: Ct Head Wo Contrast  Result Date: 06/30/2016 CLINICAL DATA:  Intra cerebral hemorrhage EXAM: CT HEAD WITHOUT CONTRAST TECHNIQUE: Contiguous axial images were obtained from the base of the skull through the vertex without intravenous contrast. COMPARISON:  CT head 06/24/2016 FINDINGS: Brain: Right frontal hematoma unchanged in size measuring approximately 3.5 x 5.5 cm. Partial resorption of high density blood products since prior study. No evidence of new hemorrhage since the prior study. Mass-effect on the right frontal lobe with 4 mm midline shift is unchanged. There is low-density edema surrounding the hematoma. Chronic left occipital infarct unchanged No new area of infarction or hemorrhage since the  prior study. Vascular: No hyperdense vessel or unexpected calcification. Skull: Negative Sinuses/Orbits: Negative Other: None IMPRESSION: Large right frontal hematoma unchanged. 4 mm of midline shift unchanged. No new findings. Electronically Signed   By: Marlan Palau M.D.   On: 06/30/2016 13:25   Ct  Head Wo Contrast  Result Date: 06/24/2016 CLINICAL DATA:  Altered mental status.  Evaluate for hemorrhage. EXAM: CT HEAD WITHOUT CONTRAST TECHNIQUE: Contiguous axial images were obtained from the base of the skull through the vertex without intravenous contrast. COMPARISON:  01/09/2016 FINDINGS: Brain: Acute hemorrhage in the high right frontal lobe. Along the anterior and lateral margins of the high-density clot is low-density that appears encapsulated, possibly unclotted blood/serum. Overall dimensions are 60 x 30 x 36 mm. There is a rim of surrounding edema and mild local subarachnoid extension. Midline shift measures up to 4 mm. No intraventricular extension or entrapment. Gliosis in the left occipital lobe is related to lobar hemorrhage in 2016. Small-vessel ischemic changes in the cerebral white matter. No noted generalized hemorrhagic changes in the cerebral hemispheres on 2016 MPGR, although amyloid angiopathy is still possible. Hypertension or hemorrhagic infarct could also present with this appearance. Vascular: Atherosclerotic calcification. Skull: Normal. Negative for fracture or focal lesion. Sinuses/Orbits: Gaze to the right. Other: Critical Value/emergent results were called by telephone at the time of interpretation on 06/24/2016 at 2:36 pm to Dr. Ethelda Chick, who verbally acknowledged these results. IMPRESSION: 1. ~32 cc right frontal acute parenchymal hematoma. Mild local subarachnoid extension. 4 mm midline shift. 2. Gliosis from remote left occipital lobar hemorrhage in 2016. Possible amyloid angiopathy, but no typical chronic hemorrhagic foci on 2016 brain MRI. Question hemorrhagic conversion of infarct. Electronically Signed   By: Marnee Spring M.D.   On: 06/24/2016 14:42   Mr Brain Wo Contrast  Result Date: 06/26/2016 CLINICAL DATA:  Follow-up acute RIGHT frontal lobe hemorrhage. History of stroke, hypertension, diabetes, dementia. EXAM: MRI HEAD WITHOUT CONTRAST TECHNIQUE: Multiplanar,  multiecho pulse sequences of the brain and surrounding structures were obtained without intravenous contrast. Maximum sedation was administered. COMPARISON:  CT HEAD June 24, 2016 and MRI of the head Feb 11, 2015 FINDINGS: Sequences are moderately or severely motion degraded. BRAIN: 2.8 x 5.9 x 3.7 cm (transverse AP by CC) (volume = 32 cm^3) RIGHT frontal lobe hematoma with hematocrit level, susceptibility artifact and minimal T1 shortening. Surrounding FLAIR T2  bright vasogenic edema. Local sulcal effacement. Mild RIGHT to LEFT midline shift, difficult to quantify on this motion degraded examination. LEFT occipital lobe encephalomalacia with susceptibility artifact back site of prior hemorrhage. Ex vacuo dilatation LEFT occipital horn, no hydrocephalus. Patchy FLAIR T2 hyperintense signal exclusive of the aforementioned abnormality compatible with chronic small vessel ischemic disease. No large extra-axial fluid collections. VASCULAR: Normal major intracranial vascular flow voids present at skull base. SKULL AND UPPER CERVICAL SPINE: Limited assessment due to motion. SINUSES/ORBITS: The mastoid air-cells and included paranasal sinuses are well-aerated. The included ocular globes and orbital contents are non-suspicious. OTHER: None. IMPRESSION: Moderate to severely motion degraded examination. Acute RIGHT frontal lobe 2.8 x 5.9 x 3.7 cm hematoma, resulting in mild RIGHT to LEFT midline shift. Old LEFT occipital lobe hemorrhagic infarct. Electronically Signed   By: Awilda Metro M.D.   On: 06/26/2016 03:45   Dg Abd Portable 1v  Result Date: 06/29/2016 CLINICAL DATA:  Feeding tube placement EXAM: PORTABLE ABDOMEN - 1 VIEW COMPARISON:  None. FINDINGS: There is a feeding tube with the tip projecting over the duodenum. There is no bowel dilatation to suggest obstruction. There is no evidence of pneumoperitoneum, portal venous gas or pneumatosis. There are no pathologic calcifications along the expected  course of the ureters. The osseous structures are unremarkable. IMPRESSION: Feeding tube with the tip projecting over the duodenum. Electronically Signed   By: Elige Ko   On: 06/29/2016 18:00   Dg Abd Portable 1v  Result Date: 06/28/2016 CLINICAL DATA:  Nasogastric tube placement. EXAM: PORTABLE ABDOMEN - 1 VIEW COMPARISON:  Sixteen 2017 . FINDINGS: NG tubes tip noted within the stomach. No bowel distention. No free air. IMPRESSION: NG tube noted with tip in stomach. Electronically Signed   By: Maisie Fus  Register   On: 06/28/2016 14:57    Assessment/Plan  Physical deconditioning Will have him work with physical therapy and occupational therapy team to help with gait training and muscle strengthening exercises.fall precautions. Skin care. Encourage to be out of bed.   ICH  Monitor neurologically, has f/u with neurology. Has had ICH before. Avoid anticoagulation and antithrombotics. Fall precautions.   Gout Recent gout flare. Completed colchicine. Continue allopurinol on daily basis and monitor  HTN Monitor bp q shift x 2 weeks. Continue hydralazine 50 mg tid, coreg 6.25 mg bid, lisinopril 20 mg bid. Check bmp  Dysphagia S/p peg tube, continue tube feeding. Get SLP to evaluate  Dementia with behavioral issues With vascular dementia. Continue supportive care. SLP to evaluate. Continue seroquel 25 mg daily  Diabetes mellitus, type 2 with neuropathy Lab Results  Component Value Date   HGBA1C 6.0 (H) 02/09/2015   Continue metformin 500 mg daily, monitor cbg. Continue gabapentin. chewck a1c  Chronic systolic CHF Continue b blocker, hydralazine and ACEI. Monitor bmp and weight  Constipation Continue senokot s bid and monitor  Protein calorie malnutrition RD consult. Continue feeding supplement. Monitor weight  Hyperlipidemia Continue lipitor  Leukocytosis Monitor wbc count, afebrile at present  Blood loss anemia From intra cerebral bleed, monitor h&h  Chronic  Depression continue duloxetine 40 mg daily  BPH Continue flomax   Goals of care: short term rehabilitation   Labs/tests ordered: cbc, cmp, a1c 07/12/16  Family/ staff Communication: reviewed care plan with nursing supervisor    Oneal Grout, MD Internal Medicine [facility] [address] [place], [place] [address] Cell Phone (Monday-Friday 8 am - 5 pm): [phone] On Call: [phone] and follow prompts after  5 pm and on weekends Office Phone: [phone] Office Fax: [phone]

## 2016-07-08 NOTE — Telephone Encounter (Signed)
Letter left at front desk for wife to pick up. 

## 2016-07-16 ENCOUNTER — Encounter (HOSPITAL_COMMUNITY): Payer: Self-pay | Admitting: *Deleted

## 2016-07-16 ENCOUNTER — Emergency Department (HOSPITAL_COMMUNITY)
Admission: EM | Admit: 2016-07-16 | Discharge: 2016-07-16 | Disposition: A | Discharge status: Home / Self Care | Payer: Medicare Other | Source: Home / Self Care | Attending: Emergency Medicine | Admitting: Emergency Medicine

## 2016-07-16 ENCOUNTER — Other Ambulatory Visit: Payer: Self-pay | Admitting: Internal Medicine

## 2016-07-16 ENCOUNTER — Telehealth: Payer: Self-pay | Admitting: Internal Medicine

## 2016-07-16 ENCOUNTER — Emergency Department (HOSPITAL_COMMUNITY): Payer: Medicare Other

## 2016-07-16 ENCOUNTER — Ambulatory Visit (HOSPITAL_COMMUNITY)
Admission: RE | Admit: 2016-07-16 | Discharge: 2016-07-16 | Disposition: A | Discharge status: Home / Self Care | Payer: Medicare Other | Source: Home / Self Care | Attending: Interventional Radiology | Admitting: Interventional Radiology

## 2016-07-16 ENCOUNTER — Ambulatory Visit (HOSPITAL_COMMUNITY)
Admission: RE | Admit: 2016-07-16 | Discharge: 2016-07-16 | Disposition: A | Discharge status: Home / Self Care | Payer: Medicare Other | Source: Ambulatory Visit | Attending: Internal Medicine | Admitting: Internal Medicine

## 2016-07-16 DIAGNOSIS — Z4682 Encounter for fitting and adjustment of non-vascular catheter: Secondary | ICD-10-CM | POA: Diagnosis not present

## 2016-07-16 DIAGNOSIS — Z8673 Personal history of transient ischemic attack (TIA), and cerebral infarction without residual deficits: Secondary | ICD-10-CM | POA: Insufficient documentation

## 2016-07-16 DIAGNOSIS — Z7984 Long term (current) use of oral hypoglycemic drugs: Secondary | ICD-10-CM | POA: Insufficient documentation

## 2016-07-16 DIAGNOSIS — Z431 Encounter for attention to gastrostomy: Secondary | ICD-10-CM | POA: Insufficient documentation

## 2016-07-16 DIAGNOSIS — I5022 Chronic systolic (congestive) heart failure: Secondary | ICD-10-CM | POA: Insufficient documentation

## 2016-07-16 DIAGNOSIS — Z931 Gastrostomy status: Secondary | ICD-10-CM

## 2016-07-16 DIAGNOSIS — I2510 Atherosclerotic heart disease of native coronary artery without angina pectoris: Secondary | ICD-10-CM

## 2016-07-16 DIAGNOSIS — K9423 Gastrostomy malfunction: Secondary | ICD-10-CM | POA: Insufficient documentation

## 2016-07-16 DIAGNOSIS — I110 Hypertensive heart disease with heart failure: Secondary | ICD-10-CM | POA: Insufficient documentation

## 2016-07-16 DIAGNOSIS — E119 Type 2 diabetes mellitus without complications: Secondary | ICD-10-CM

## 2016-07-16 DIAGNOSIS — R1310 Dysphagia, unspecified: Secondary | ICD-10-CM

## 2016-07-16 DIAGNOSIS — Z87891 Personal history of nicotine dependence: Secondary | ICD-10-CM | POA: Insufficient documentation

## 2016-07-16 DIAGNOSIS — I252 Old myocardial infarction: Secondary | ICD-10-CM | POA: Insufficient documentation

## 2016-07-16 HISTORY — PX: IR GENERIC HISTORICAL: IMG1180011

## 2016-07-16 MED ORDER — IOPAMIDOL (ISOVUE-300) INJECTION 61%
INTRAVENOUS | Status: AC
  Administered 2016-07-16: 10 mL
  Filled 2016-07-16: qty 50

## 2016-07-16 MED ORDER — DIATRIZOATE MEGLUMINE & SODIUM 66-10 % PO SOLN
30.0000 mL | Freq: Once | ORAL | Status: AC
  Administered 2016-07-16: 30 mL via ORAL

## 2016-07-16 MED ORDER — LIDOCAINE VISCOUS 2 % MT SOLN
OROMUCOSAL | Status: AC
  Administered 2016-07-16: 5 mL
  Filled 2016-07-16: qty 15

## 2016-07-16 NOTE — ED Triage Notes (Signed)
[facility] with c/o diclodged G Tube

## 2016-07-16 NOTE — Telephone Encounter (Signed)
  Patient pulled his g tube out this morning. A foley tube was placed to help keep the g tube site viable and patient was sent to ED to have a G tube placed via IR. Patient has been sent back with the foley tube. He gets his continuous feeding and medication via g tube. I have spoken with IR department and patient is being [facility] via EMS [facility] to get g tube placed through Dr Fredia SorrowYamagata.

## 2016-07-16 NOTE — ED Provider Notes (Signed)
[facility]-EMERGENCY DEPT Provider Note   CSN: [id number] Arrival date & time: 07/16/16  1011     History   Chief Complaint Chief Complaint  Patient presents with  . Dislodged G Tube  level 5 caveat due to Nonverbal status.  HPI Austin Sawyer is a 81 y.o. male.  HPI Patient had G-tube placed for large stroke around 2 weeks ago. Report was sent in from nursing home for dislodged G-tube. Patient has a Foley catheter in place through the ostomy. It is a 69 [place] Foley and it appears that he had a 60 [place] G-tube and according to the notes. No real paperwork accompanied the patient describing why he was sent here. Foley flushes easily but will not draw back. Patient initially had his daughter here but I was not able to talk to her and she is no longer here.   Past Medical History:  Diagnosis Date  . Alcohol abuse, in remission   . Atrial flutter (HCC)   . Back pain   . Cervicalgia   . Coronary artery disease   . Coronary atherosclerosis of unspecified type of vessel, native or graft   . Dementia   . Depression   . Diabetes mellitus without complication (HCC)   . Disturbance of skin sensation   . Hypertension   . MI, old   . Reflux   . Stroke (HCC)   . Unspecified hearing loss   . Unspecified sleep apnea     Patient Active Problem List   Diagnosis Date Noted  . Vascular dementia 11/24/2015  . Atrial flutter, unspecified 11/24/2015  . Cognitive impairment 08/19/2015  . Type 2 diabetes mellitus with other circulatory complications 05/14/2015  . Chronic systolic congestive heart failure (HCC) 05/14/2015  . ICH (intracerebral hemorrhage) (HCC)   . HLD (hyperlipidemia)   . Agitation   . Accelerated hypertension   . Encephalopathy acute 02/17/2015  . SVT (supraventricular tachycardia) (HCC) 02/16/2015  . Cytotoxic brain edema (HCC)   . Encounter for feeding tube placement   . CHF (congestive heart failure) (HCC)   . Atelectasis   . Acute respiratory failure with hypoxemia  (HCC) 02/10/2015  . Hemorrhage of brain, nontraumatic (HCC) 02/08/2015  . Stroke due to intracerebral hemorrhage (HCC) 02/08/2015  . Cervical disc disorder with radiculopathy of cervical region 09/14/2013  . Neck pain on left side 07/12/2013  . Paresthesia of left arm 07/12/2013  . Major depressive disorder, recurrent episode, severe (HCC) 04/30/2013  . Alcohol dependence in remission (HCC) 04/22/2013  . Hard of hearing 04/22/2013  . OBESITY, UNSPECIFIED 12/09/2009  . CARDIOMYOPATHY, SECONDARY 09/03/2009  . ATRIAL FLUTTER 09/03/2009  . Gout 03/12/2008  . HYPERGLYCEMIA 02/14/2008  . ANGINA PECTORIS 10/03/2007  . BARRETTS ESOPHAGUS 10/03/2007  . ARTHRITIS 10/03/2007  . WEIGHT LOSS 04/06/2007  . ANXIETY DEPRESSION 03/14/2007  . ABUSE, ALCOHOL, IN REMISSION 03/08/2007  . CORONARY ARTERY DISEASE 03/08/2007  . LACUNAR INFARCTION 03/08/2007  . COLONOSCOPY, HX OF 03/08/2007  . HYPOGONADISM 01/25/2007  . Hyperlipidemia 01/25/2007  . Essential hypertension 01/25/2007  . CONGESTIVE HEART FAILURE 01/25/2007  . GERD 01/25/2007  . SLEEP APNEA 01/25/2007    Past Surgical History:  Procedure Laterality Date  . HERNIA REPAIR    . IR GENERIC HISTORICAL  07/16/2016   IR REPLC GASTRO/COLONIC TUBE PERCUT W/FLUORO 07/16/2016 Irish Lack, MD [facility]-INTERV RAD  . PEG PLACEMENT N/A 07/02/2016   Procedure: PERCUTANEOUS ENDOSCOPIC GASTROSTOMY (PEG) PLACEMENT;  Surgeon: Violeta Gelinas, MD;  Location: [facility] ENDOSCOPY;  Service: Endoscopy;  Laterality: N/A;  .  ROTATOR CUFF REPAIR         Home Medications    Prior to Admission medications   Medication Sig Start Date End Date Taking? Authorizing Provider  albuterol (PROVENTIL) (2.5 MG/3ML) 0.083% nebulizer solution Take 3 mLs (2.5 mg total) by nebulization 2 (two) times daily as needed for wheezing or shortness of breath. 02/21/15  Yes David L Rinehuls, PA-C  allopurinol (ZYLOPRIM) 300 MG tablet Take 300 mg by mouth daily. For gout   Yes Historical  Provider, MD  Amino Acids-Protein Hydrolys (FEEDING SUPPLEMENT, PRO-STAT SUGAR FREE 64,) LIQD Place 30 mLs into feeding tube 2 (two) times daily. 07/02/16  Yes Layne Benton, NP  atorvastatin (LIPITOR) 20 MG tablet Take 1 tablet (20 mg total) by mouth daily at 6 PM. 07/02/16  Yes Layne Benton, NP  carvedilol (COREG) 6.25 MG tablet Take 1 tablet (6.25 mg total) by mouth 2 (two) times daily with a meal. 07/06/16  Yes Marvel Plan, MD  DULoxetine 40 MG CPEP Take 40 mg by mouth daily. 02/21/15  Yes David L Rinehuls, PA-C  gabapentin (NEURONTIN) 250 MG/5ML solution Place 6 mLs (300 mg total) into feeding tube daily. 07/03/16  Yes Layne Benton, NP  hydrALAZINE (APRESOLINE) 50 MG tablet Take 50 mg by mouth 3 (three) times daily.   Yes Historical Provider, MD  lisinopril (PRINIVIL,ZESTRIL) 20 MG tablet Take 1 tablet (20 mg total) by mouth 2 (two) times daily. 07/06/16  Yes Marvel Plan, MD  metFORMIN (GLUCOPHAGE) 500 MG tablet Take 500 mg by mouth daily.    Yes Historical Provider, MD  nitroGLYCERIN (NITROSTAT) 0.4 MG SL tablet Place 0.4 mg under the tongue every 5 (five) minutes as needed. Chest pain   Yes Historical Provider, MD  Nutritional Supplements (FEEDING SUPPLEMENT, JEVITY 1.2 CAL,) LIQD Place 1,000 mLs into feeding tube continuous. 07/02/16  Yes Layne Benton, NP  QUEtiapine (SEROQUEL) 25 MG tablet Take 1 tablet (25 mg total) by mouth at bedtime. 02/21/15  Yes David L Rinehuls, PA-C  senna-docusate (SENOKOT-S) 8.6-50 MG tablet Take 1 tablet by mouth 2 (two) times daily. 07/02/16  Yes Layne Benton, NP  tamsulosin (FLOMAX) 0.4 MG CAPS capsule Take 0.4 mg by mouth daily.    Yes Historical Provider, MD    Family History Family History  Problem Relation Age of Onset  . Anxiety disorder Mother   . Cancer Mother     Social History Social History  Substance Use Topics  . Smoking status: Former Games developer  . Smokeless tobacco: Never Used  . Alcohol use No     Allergies   Review of patient's allergies  indicates no known allergies.   Review of Systems Review of Systems  Unable to perform ROS: Patient nonverbal     Physical Exam Updated Vital Signs BP 145/86   Pulse 73   Temp 98.4 F (36.9 C) (Oral)   Resp 20   Ht 5\' 9"  (1.753 m)   Wt 210 lb (95.3 kg)   SpO2 100%   BMI 31.01 kg/m   Physical Exam  Constitutional: He appears well-developed.  HENT:  Head: Atraumatic.  Cardiovascular: Normal rate.   Pulmonary/Chest: Effort normal.  Abdominal: There is no tenderness.  Foley catheter in left upper quadrant abdomen in place of G-tube.  Neurological:  Patient is verbal but with some wrong words. Not moving left side. Gaze deviated right.  Skin: Skin is warm. Capillary refill takes less than 2 seconds.     ED Treatments / Results  Labs (all labs ordered are listed, but only abnormal results are displayed) Labs Reviewed - No data to display  EKG  EKG Interpretation None       Radiology No results found.  Procedures Procedures (including critical care time)  Medications Ordered in ED Medications  diatrizoate meglumine-sodium (GASTROGRAFIN) 66-10 % solution 30 mL (30 mLs Oral Given 07/16/16 1130)     Initial Impression / Assessment and Plan / ED Course  I have reviewed the triage vital signs and the nursing notes.  Pertinent labs & imaging results that were available during my care of the patient were reviewed by me and considered in my medical decision making (see chart for details).  Clinical Course    discussedWith the patient's daughter. He had reportedly pulled his PEG tube out the hallway with the balloon up and was not replaced. However at this time the tube is in place and was not replaced by me. X-ray done with contrast to reassure gastric placement. Daughter states that this is the same to but his been in goal time, however it looks to be a different size than the note of when it was put in. Discharge back to nursing home.  Dr. Glade LloydPandey, the nursing  home doctor later called back. States that she sent the patient in to get a G-tube instead of the Foley catheter. This was not in any of the paperwork. She was instructed to discuss with interventional radiology as by the time she called back it is likely too late to get done in the ER. Also with it being only 792 weeks old and would not want to change it over to a larger gauge myself.  Final Clinical Impressions(s) / ED Diagnoses   Final diagnoses:  Gastrostomy tube in place [facility](HCC)    New Prescriptions Discharge Medication List as of 07/16/2016 11:48 AM       Benjiman CoreNathan Rumeal Cullipher, MD 07/19/16 [phone]

## 2016-07-16 NOTE — ED Triage Notes (Signed)
At 1040 Dr Rubin PayorPickering present, g-tube flushed without problems. Obtaining abd x-ray to verify placement.

## 2016-07-16 NOTE — ED Notes (Signed)
Called [facility] to [facility]

## 2016-07-16 NOTE — ED Triage Notes (Signed)
Attempted x 3 to contact nurse at facility, no one will answer the phone

## 2016-07-19 ENCOUNTER — Encounter (HOSPITAL_COMMUNITY): Payer: Self-pay | Admitting: Interventional Radiology

## 2016-07-19 LAB — CBC AND DIFFERENTIAL
HEMATOCRIT: 39 % — AB (ref 41–53)
HEMOGLOBIN: 12.5 g/dL — AB (ref 13.5–17.5)
Neutrophils Absolute: 12 /uL
PLATELETS: 253 10*3/uL (ref 150–399)
WBC: 15 10*3/mL

## 2016-07-19 LAB — BASIC METABOLIC PANEL
BUN: 34 mg/dL — AB (ref 4–21)
Creatinine: 0.9 mg/dL (ref 0.6–1.3)
GLUCOSE: 121 mg/dL
Potassium: 4.9 mmol/L (ref 3.4–5.3)
Sodium: 138 mmol/L (ref 137–147)

## 2016-07-19 LAB — HEPATIC FUNCTION PANEL
AST: 54 U/L — AB (ref 14–40)
Alkaline Phosphatase: 86 U/L (ref 25–125)
BILIRUBIN, TOTAL: 0.5 mg/dL

## 2016-07-19 LAB — HEMOGLOBIN A1C: HEMOGLOBIN A1C: 6.4

## 2016-07-23 DIAGNOSIS — R1310 Dysphagia, unspecified: Secondary | ICD-10-CM | POA: Diagnosis not present

## 2016-07-23 DIAGNOSIS — Z5189 Encounter for other specified aftercare: Secondary | ICD-10-CM | POA: Diagnosis not present

## 2016-07-23 DIAGNOSIS — G8110 Spastic hemiplegia affecting unspecified side: Secondary | ICD-10-CM | POA: Diagnosis not present

## 2016-07-23 DIAGNOSIS — M79602 Pain in left arm: Secondary | ICD-10-CM | POA: Diagnosis not present

## 2016-07-23 DIAGNOSIS — I69159 Hemiplegia and hemiparesis following nontraumatic intracerebral hemorrhage affecting unspecified side: Secondary | ICD-10-CM | POA: Diagnosis not present

## 2016-07-23 DIAGNOSIS — I6921 Attention and concentration deficit following other nontraumatic intracranial hemorrhage: Secondary | ICD-10-CM | POA: Diagnosis not present

## 2016-07-23 DIAGNOSIS — R41841 Cognitive communication deficit: Secondary | ICD-10-CM | POA: Diagnosis not present

## 2016-07-27 DIAGNOSIS — I69159 Hemiplegia and hemiparesis following nontraumatic intracerebral hemorrhage affecting unspecified side: Secondary | ICD-10-CM | POA: Diagnosis not present

## 2016-07-27 DIAGNOSIS — R41841 Cognitive communication deficit: Secondary | ICD-10-CM | POA: Diagnosis not present

## 2016-07-27 DIAGNOSIS — I6921 Attention and concentration deficit following other nontraumatic intracranial hemorrhage: Secondary | ICD-10-CM | POA: Diagnosis not present

## 2016-07-27 DIAGNOSIS — R1310 Dysphagia, unspecified: Secondary | ICD-10-CM | POA: Diagnosis not present

## 2016-07-27 DIAGNOSIS — G8110 Spastic hemiplegia affecting unspecified side: Secondary | ICD-10-CM | POA: Diagnosis not present

## 2016-07-27 DIAGNOSIS — M79602 Pain in left arm: Secondary | ICD-10-CM | POA: Diagnosis not present

## 2016-07-27 DIAGNOSIS — Z5189 Encounter for other specified aftercare: Secondary | ICD-10-CM | POA: Diagnosis not present

## 2016-07-27 LAB — CBC AND DIFFERENTIAL
HCT: 37 % — AB (ref 41–53)
Hemoglobin: 11.8 g/dL — AB (ref 13.5–17.5)
Neutrophils Absolute: 11 /uL
Platelets: 182 10*3/uL (ref 150–399)
WBC: 14.1 10^3/mL

## 2016-08-03 DIAGNOSIS — I69159 Hemiplegia and hemiparesis following nontraumatic intracerebral hemorrhage affecting unspecified side: Secondary | ICD-10-CM | POA: Diagnosis not present

## 2016-08-03 DIAGNOSIS — Z5189 Encounter for other specified aftercare: Secondary | ICD-10-CM | POA: Diagnosis not present

## 2016-08-03 DIAGNOSIS — I6921 Attention and concentration deficit following other nontraumatic intracranial hemorrhage: Secondary | ICD-10-CM | POA: Diagnosis not present

## 2016-08-03 DIAGNOSIS — G8110 Spastic hemiplegia affecting unspecified side: Secondary | ICD-10-CM | POA: Diagnosis not present

## 2016-08-03 DIAGNOSIS — M79602 Pain in left arm: Secondary | ICD-10-CM | POA: Diagnosis not present

## 2016-08-03 DIAGNOSIS — R1310 Dysphagia, unspecified: Secondary | ICD-10-CM | POA: Diagnosis not present

## 2016-08-03 DIAGNOSIS — R41841 Cognitive communication deficit: Secondary | ICD-10-CM | POA: Diagnosis not present

## 2016-08-04 ENCOUNTER — Encounter: Payer: Self-pay | Admitting: Adult Health

## 2016-08-04 ENCOUNTER — Non-Acute Institutional Stay (SKILLED_NURSING_FACILITY): Payer: Medicare Other | Admitting: Adult Health

## 2016-08-04 DIAGNOSIS — M109 Gout, unspecified: Secondary | ICD-10-CM | POA: Diagnosis not present

## 2016-08-04 DIAGNOSIS — I618 Other nontraumatic intracerebral hemorrhage: Secondary | ICD-10-CM | POA: Diagnosis not present

## 2016-08-04 DIAGNOSIS — G629 Polyneuropathy, unspecified: Secondary | ICD-10-CM

## 2016-08-04 DIAGNOSIS — N390 Urinary tract infection, site not specified: Secondary | ICD-10-CM

## 2016-08-04 DIAGNOSIS — F01518 Vascular dementia, unspecified severity, with other behavioral disturbance: Secondary | ICD-10-CM

## 2016-08-04 DIAGNOSIS — N400 Enlarged prostate without lower urinary tract symptoms: Secondary | ICD-10-CM

## 2016-08-04 DIAGNOSIS — E785 Hyperlipidemia, unspecified: Secondary | ICD-10-CM | POA: Diagnosis not present

## 2016-08-04 DIAGNOSIS — I10 Essential (primary) hypertension: Secondary | ICD-10-CM | POA: Diagnosis not present

## 2016-08-04 DIAGNOSIS — R5381 Other malaise: Secondary | ICD-10-CM | POA: Diagnosis not present

## 2016-08-04 DIAGNOSIS — I69391 Dysphagia following cerebral infarction: Secondary | ICD-10-CM | POA: Diagnosis not present

## 2016-08-04 DIAGNOSIS — I5022 Chronic systolic (congestive) heart failure: Secondary | ICD-10-CM

## 2016-08-04 DIAGNOSIS — K5900 Constipation, unspecified: Secondary | ICD-10-CM

## 2016-08-04 DIAGNOSIS — F0151 Vascular dementia with behavioral disturbance: Secondary | ICD-10-CM | POA: Diagnosis not present

## 2016-08-04 DIAGNOSIS — E1142 Type 2 diabetes mellitus with diabetic polyneuropathy: Secondary | ICD-10-CM | POA: Diagnosis not present

## 2016-08-04 DIAGNOSIS — F332 Major depressive disorder, recurrent severe without psychotic features: Secondary | ICD-10-CM

## 2016-08-04 NOTE — Progress Notes (Addendum)
Patient ID: Austin Sawyer, male   DOB: [birth year], 81 y.o.   MRN: [id number]    DATE:    08/04/16   MRN:  [id number]  BIRTHDAY: 11-29-1934  Facility:  Nursing Home Location:  [facility]  Nursing Home Room Number: 104-P  LEVEL OF CARE:  SNF (31)  Contact Information    Name Relation Home Work Mobile   Maxatawny Spouse [phone]     Eligah, Anello [phone]     Johnston Ebbs Daughter [phone]         Code Status History    Date Active Date Inactive Code Status Order ID Comments User Context   06/24/2016 11:08 PM 07/07/2016  8:40 AM Full Code [id number]  Ulice Dash, PA-C Inpatient   02/08/2015 10:11 PM 02/21/2015 11:59 PM Full Code [id number]  Lunette Stands, MD Inpatient   04/22/2013  4:29 PM 04/23/2013 11:53 AM Full Code [id number]  Gilda Crease, MD ED       Chief Complaint  Patient presents with  . Medical Management of Chronic Issues    HISTORY OF PRESENT ILLNESS:  This is a 81 year old male who is being seen for a routine visit. He is currently having a short-term rehabilitation @ [address], PT, [place] and OT. He is currently treated with Cipro for UTI. Urine culture showed greater than 100,000 CFU/mL Citrobacter koseri.  He has been admitted to [facility] on 07/06/16 from [facility] with admission date 06/24/16 through 07/06/16. He had right frontal intracranial hemorrhage with midline shift. It was thought to be from uncontrolled hypertension. Neurology was consulted. He had dysphagia and had PEG tube placement. He was treated for gout flare with Colchicine.    PAST MEDICAL HISTORY:  Past Medical History:  Diagnosis Date  . Alcohol abuse, in remission   . Atrial flutter (HCC)   . Back pain   . Cervicalgia   . Coronary artery disease   . Coronary atherosclerosis of unspecified type of vessel, native or graft   . Dementia   . Depression   . Diabetes mellitus without complication (HCC)   . Disturbance of skin  sensation   . Hypertension   . MI, old   . Reflux   . Stroke (HCC)   . Unspecified hearing loss   . Unspecified sleep apnea      CURRENT MEDICATIONS: Reviewed  Patient's Medications  New Prescriptions   No medications on file  Previous Medications   ACETAMINOPHEN (TYLENOL) 325 MG TABLET    Take 650 mg by mouth every 4 (four) hours as needed for mild pain.   ALBUTEROL (PROVENTIL) (2.5 MG/3ML) 0.083% NEBULIZER SOLUTION    Take 3 mLs (2.5 mg total) by nebulization 2 (two) times daily as needed for wheezing or shortness of breath.   ALLOPURINOL (ZYLOPRIM) 300 MG TABLET    Take 300 mg by mouth daily. For gout   ATORVASTATIN (LIPITOR) 20 MG TABLET    Take 1 tablet (20 mg total) by mouth daily at 6 PM.   CARVEDILOL (COREG) 12.5 MG TABLET    Take 12.5 mg by mouth 2 (two) times daily with a meal.   DULOXETINE 40 MG CPEP    Take 40 mg by mouth daily.   GABAPENTIN (NEURONTIN) 250 MG/5ML SOLUTION    Take 300 mg by mouth daily.   GERIATRIC MULTIVITAMINS-MINERALS (ELDERTONIC/GEVRABON) ELIX    Take 15 mLs by mouth 2 (two) times daily.   HYDRALAZINE (APRESOLINE) 50 MG TABLET    Place  50 mg into feeding tube 4 (four) times daily.    LISINOPRIL (PRINIVIL,ZESTRIL) 20 MG TABLET    Take 1 tablet (20 mg total) by mouth 2 (two) times daily.   METFORMIN (GLUCOPHAGE) 500 MG TABLET    Take 500 mg by mouth daily. 6AM   NITROGLYCERIN (NITROSTAT) 0.4 MG SL TABLET    Place 0.4 mg under the tongue every 5 (five) minutes as needed. Chest pain   NUTRITIONAL SUPPLEMENT LIQD    Take 120 mLs by mouth 2 (two) times daily. NSA MedPass between breakfast and lunch and lunch and dinner   QUETIAPINE (SEROQUEL) 25 MG TABLET    Take 1 tablet (25 mg total) by mouth at bedtime.   SENNA-DOCUSATE (SENOKOT-S) 8.6-50 MG TABLET    Take 1 tablet by mouth 2 (two) times daily.   TAMSULOSIN (FLOMAX) 0.4 MG CAPS CAPSULE    Take 0.4 mg by mouth daily.   Modified Medications   No medications on file  Discontinued Medications   AMINO  ACIDS-PROTEIN HYDROLYS (FEEDING SUPPLEMENT, PRO-STAT SUGAR FREE 64,) LIQD    Place 30 mLs into feeding tube 2 (two) times daily.   CARVEDILOL (COREG) 6.25 MG TABLET    Take 1 tablet (6.25 mg total) by mouth 2 (two) times daily with a meal.   GABAPENTIN (NEURONTIN) 250 MG/5ML SOLUTION    Place 6 mLs (300 mg total) into feeding tube daily.   NUTRITIONAL SUPPLEMENTS (FEEDING SUPPLEMENT, JEVITY 1.2 CAL,) LIQD    Place 1,000 mLs into feeding tube continuous.   NUTRITIONAL SUPPLEMENTS (FEEDING SUPPLEMENT, JEVITY 1.2 CAL,) LIQD    Place 85 mLs into feeding tube. 85 mL/hour continuous for 20 hours, off between 10AM and 2PM   NUTRITIONAL SUPPLEMENTS (FEEDING SUPPLEMENT, JEVITY 1.5 CAL,) LIQD    Place 237 mLs into feeding tube. Six times daily      No Known Allergies   REVIEW OF SYSTEMS:  GENERAL: no change in appetite, no fatigue, no weight changes, no fever, chills or weakness EYES: Denies change in vision, dry eyes, eye pain, itching or discharge EARS: Denies ringing in ears, or earache NOSE: Denies nasal congestion or epistaxis MOUTH and THROAT: Denies oral discomfort, gingival pain or bleeding, pain from teeth or hoarseness   RESPIRATORY: no cough, SOB, DOE, wheezing, hemoptysis CARDIAC: no chest pain, edema or palpitations GI: no abdominal pain, diarrhea, constipation, heart burn, nausea or vomiting GU: Denies dysuria, frequency, hematuria, incontinence, or discharge PSYCHIATRIC: Denies feeling of depression or anxiety. No report of hallucinations, insomnia, paranoia, or agitation     PHYSICAL EXAMINATION  GENERAL APPEARANCE: Well nourished. In no acute distress. Obese SKIN:  Skin is warm and dry.  HEAD: Normal in size and contour. No evidence of trauma EYES: Lids open and close normally. No blepharitis, entropion or ectropion. PERRL. Conjunctivae are clear and sclerae are white. Lenses are without opacity EARS: Pinnae are normal. Hard of hearing MOUTH and THROAT: Lips are without  lesions. Oral mucosa is moist and without lesions. Tongue is normal in shape, size, and color and without lesions NECK: supple, trachea midline, no neck masses, no thyroid tenderness, no thyromegaly LYMPHATICS: no LAN in the neck, no supraclavicular LAN RESPIRATORY: breathing is even & unlabored, BS CTAB CARDIAC: RRR, no murmur,no extra heart sounds, no edema GI: abdomen soft, normal BS, no masses, no tenderness, no hepatomegaly, no splenomegaly, has PEG tube on LUQ EXTREMITIES:  Able to move 4 extremities PSYCHIATRIC: Alert to person and disoriented to time and place. Affect and behavior are appropriate  LABS/RADIOLOGY: Labs reviewed: Basic Metabolic Panel:  Recent Labs  16/10/96 0844 07/04/16 0610 07/05/16 0633 07/19/16   NA 140 141 143 138   K 3.9 4.2 3.9 4.9   CL 104 105 107  --    CO2 25 26 29   --    GLUCOSE 109* 127* 139*  --    BUN 22* 24* 23* 34*   CREATININE 0.89 0.95 0.93 0.9   CALCIUM 9.0 8.8* 8.9  --     Liver Function Tests:  Recent Labs  01/09/16 1033 06/24/16 1352 07/19/16  AST 18 19 54*  ALT 15* 17  --   ALKPHOS 54 64 86  BILITOT 0.8 0.7  --   PROT 7.1 8.1  --   ALBUMIN 3.8 4.2  --    CBC:  Recent Labs  06/24/16 1352  07/03/16 0844 07/04/16 0610 07/05/16 0633 07/19/16 07/27/16   WBC 13.1*  < > 12.0* 6.4 11.6* 15.0 14.1   NEUTROABS 10.6*  --   --   --   --  12 11   HGB 13.9  < > 12.5* 12.0* 11.6* 12.5* 11.8*   HCT 42.4  < > 39.1 33.4* 36.6* 39* 37*   MCV 90.2  < > 92.0 90.5 90.8  --   --    PLT 202  < > 180 393 203 253 182   < > = values in this interval not displayed.  Lipid Panel:  Recent Labs  06/28/16 0347  HDL 35*   CBG:  Recent Labs  07/06/16 0758 07/06/16 1218 07/06/16 1625  GLUCAP 136* 104* 120*      Ir Gastrostomy Tube Removal  Result Date: 09/07/2016 INDICATION: Dysphagia, resolved.  Request removal of gastrostomy tube. EXAM: BEDSIDE REMOVAL OF GASTROSTOMY TUBE COMPARISON:  None. MEDICATIONS: None. CONTRAST:  None  FLUOROSCOPY TIME:  None COMPLICATIONS: None immediate. PROCEDURE: A time-out was performed prior to the initiation of the procedure. Balloon retention gastrostomy tube identified in the left upper quadrant. Approximately 8 cc of saline was removed from the balloon. Once the balloon was deflated, gastrostomy tube was removed without difficulty. A dressing was placed. The patient tolerated the procedure well without immediate postprocedural complication. IMPRESSION: Successful bedside removal of pull-through gastrostomy tube without complication. Read by: Brayton El PA-C Electronically Signed   By: Gilmer Mor D.O.   On: 09/07/2016 13:33    ASSESSMENT/PLAN:  Physical deconditioning - continue rehabilitation, PT and OT, for therapeutic strengthening exercises; fall precautions  ICH - follow-up with neurology; continue to avoid anticoagulation and anti-thrombolytics; fall precautions   Hypertension - well controlled; continue Coreg 6.25 mg 1 tab by a PEG tube every 12 hours, lisinopril 20 mg 1 tab by a PEG tube twice a day, hydralazine 50 mg via PEG tube 3 times a day  Diabetes mellitus, type II - continue Glucophage 500 mg 1 tab via PEG tube daily Lab Results  Component Value Date   HGBA1C 6.4 07/19/2016   Constipation - continue senna S 1 tab via PEG tube twice a day  Gout - continue allopurinol 300 mg by PEG tube daily  Depression - continue Cymbalta 40 mg by a PEG tube daily  Neuropathy - continue gabapentin 250 mg/5 ML gives 6 mL by a PEG tube daily  Chronic systolic CHF - continue Coreg 0.45 mg by a PEG tube every 12 hours, lisinopril 20 mg 1 tab via PEG tube twice a day and hydralazine 50 mg via PEG tube 3 times a day  BPH -  continue Flomax 0.4 mg 1 capsule by a PEG tube daily  Hyperlipidemia - continue Lipitor 20 mg 1 tab via PEG tube daily at bedtime Lab Results  Component Value Date   CHOL 216 (H) 06/28/2016   HDL 35 (L) 06/28/2016   LDLCALC 158 (H) 06/28/2016   TRIG 114  06/28/2016   CHOLHDL 6.2 06/28/2016   Dementia with behavioral disturbance - continue supportive care; fall precautions; Seroquel 25 mg via PEG tube daily at bedtime  Dysphagia - has PEG tube; currently having speech therapy treatments   UTI - continue Cipro    Goals of care:  Short-term rehabilitation    Kenard GowerMonina Medina-Vargas, NP [facility] [phone]

## 2016-08-05 DIAGNOSIS — R41841 Cognitive communication deficit: Secondary | ICD-10-CM | POA: Diagnosis not present

## 2016-08-05 DIAGNOSIS — I69159 Hemiplegia and hemiparesis following nontraumatic intracerebral hemorrhage affecting unspecified side: Secondary | ICD-10-CM | POA: Diagnosis not present

## 2016-08-05 DIAGNOSIS — G8110 Spastic hemiplegia affecting unspecified side: Secondary | ICD-10-CM | POA: Diagnosis not present

## 2016-08-05 DIAGNOSIS — R1310 Dysphagia, unspecified: Secondary | ICD-10-CM | POA: Diagnosis not present

## 2016-08-05 DIAGNOSIS — Z5189 Encounter for other specified aftercare: Secondary | ICD-10-CM | POA: Diagnosis not present

## 2016-08-05 DIAGNOSIS — M79602 Pain in left arm: Secondary | ICD-10-CM | POA: Diagnosis not present

## 2016-08-05 DIAGNOSIS — I6921 Attention and concentration deficit following other nontraumatic intracranial hemorrhage: Secondary | ICD-10-CM | POA: Diagnosis not present

## 2016-08-17 DIAGNOSIS — G8110 Spastic hemiplegia affecting unspecified side: Secondary | ICD-10-CM | POA: Diagnosis not present

## 2016-08-17 DIAGNOSIS — Z5189 Encounter for other specified aftercare: Secondary | ICD-10-CM | POA: Diagnosis not present

## 2016-08-17 DIAGNOSIS — M79602 Pain in left arm: Secondary | ICD-10-CM | POA: Diagnosis not present

## 2016-08-17 DIAGNOSIS — R1310 Dysphagia, unspecified: Secondary | ICD-10-CM | POA: Diagnosis not present

## 2016-08-17 DIAGNOSIS — I69159 Hemiplegia and hemiparesis following nontraumatic intracerebral hemorrhage affecting unspecified side: Secondary | ICD-10-CM | POA: Diagnosis not present

## 2016-08-17 DIAGNOSIS — I6921 Attention and concentration deficit following other nontraumatic intracranial hemorrhage: Secondary | ICD-10-CM | POA: Diagnosis not present

## 2016-08-17 DIAGNOSIS — R41841 Cognitive communication deficit: Secondary | ICD-10-CM | POA: Diagnosis not present

## 2016-09-02 ENCOUNTER — Other Ambulatory Visit: Payer: Self-pay | Admitting: Adult Health

## 2016-09-02 DIAGNOSIS — R1310 Dysphagia, unspecified: Secondary | ICD-10-CM

## 2016-09-03 ENCOUNTER — Non-Acute Institutional Stay (SKILLED_NURSING_FACILITY): Payer: Medicare Other | Admitting: Adult Health

## 2016-09-03 ENCOUNTER — Encounter: Payer: Self-pay | Admitting: Adult Health

## 2016-09-03 DIAGNOSIS — I10 Essential (primary) hypertension: Secondary | ICD-10-CM

## 2016-09-03 DIAGNOSIS — G629 Polyneuropathy, unspecified: Secondary | ICD-10-CM | POA: Diagnosis not present

## 2016-09-03 DIAGNOSIS — F01518 Vascular dementia, unspecified severity, with other behavioral disturbance: Secondary | ICD-10-CM

## 2016-09-03 DIAGNOSIS — E1142 Type 2 diabetes mellitus with diabetic polyneuropathy: Secondary | ICD-10-CM | POA: Diagnosis not present

## 2016-09-03 DIAGNOSIS — E785 Hyperlipidemia, unspecified: Secondary | ICD-10-CM | POA: Diagnosis not present

## 2016-09-03 DIAGNOSIS — N400 Enlarged prostate without lower urinary tract symptoms: Secondary | ICD-10-CM

## 2016-09-03 DIAGNOSIS — M109 Gout, unspecified: Secondary | ICD-10-CM

## 2016-09-03 DIAGNOSIS — K5900 Constipation, unspecified: Secondary | ICD-10-CM

## 2016-09-03 DIAGNOSIS — I5022 Chronic systolic (congestive) heart failure: Secondary | ICD-10-CM | POA: Diagnosis not present

## 2016-09-03 DIAGNOSIS — I618 Other nontraumatic intracerebral hemorrhage: Secondary | ICD-10-CM | POA: Diagnosis not present

## 2016-09-03 DIAGNOSIS — F0151 Vascular dementia with behavioral disturbance: Secondary | ICD-10-CM | POA: Diagnosis not present

## 2016-09-03 DIAGNOSIS — F332 Major depressive disorder, recurrent severe without psychotic features: Secondary | ICD-10-CM | POA: Diagnosis not present

## 2016-09-03 NOTE — Progress Notes (Addendum)
Patient ID: Austin Sawyer, male   DOB: 08/04/1935, 81 y.o.   MRN: [id number]    DATE:    09/03/16   MRN:  [id number]  BIRTHDAY: 04/26/1935  Facility:  Nursing Home Location:  [facility]  Nursing Home Room Number: 104-P  LEVEL OF CARE:  SNF (31)  Contact Information    Name Relation Home Work Mobile   WyanoBryant,Thelma Spouse [phone]     Sheron NightingaleBryant,Lawrence Son [phone]     Johnston EbbsWilson,Sandra Daughter [phone]         Code Status History    Date Active Date Inactive Code Status Order ID Comments User Context   06/24/2016 11:08 PM 07/07/2016  8:40 AM Full Code [id number]  Ulice DashDavid R Smith, PA-C Inpatient   02/08/2015 10:11 PM 02/21/2015 11:59 PM Full Code [id number]  Lunette Standssvaldo A Camilo, MD Inpatient   04/22/2013  4:29 PM 04/23/2013 11:53 AM Full Code [id number]  Gilda Creasehristopher J. Pollina, MD ED       Chief Complaint  Patient presents with  . Medical Management of Chronic Issues    HISTORY OF PRESENT ILLNESS:  This is a 81 year old male who is being seen for a routine visit. He has been discharged from PT, OT and ST recently. He is scheduled to have PEG tube to be removed by IR since he is able to swallow food now safely.   [facility] with admission date 06/24/16 through 07/06/16. He had right frontal intracranial hemorrhage with midline shift. It was thought to be from uncontrolled hypertension. Neurology was consulted. He had dysphagia and had PEG tube placement. He was treated for gout flare with Colchicine.   PAST MEDICAL HISTORY:  Past Medical History:  Diagnosis Date  . Alcohol abuse, in remission   . Atrial flutter (HCC)   . Back pain   . Cervicalgia   . Coronary artery disease   . Coronary atherosclerosis of unspecified type of vessel, native or graft   . Dementia   . Depression   . Diabetes mellitus without complication (HCC)   . Disturbance of skin sensation   . Hypertension   . MI, old   .  Reflux   . Stroke (HCC)   . Unspecified hearing loss   . Unspecified sleep apnea      CURRENT MEDICATIONS: Reviewed  Patient's Medications  New Prescriptions   No medications on file  Previous Medications   ACETAMINOPHEN (TYLENOL) 325 MG TABLET    Take 650 mg by mouth every 4 (four) hours as needed for mild pain.   ALBUTEROL (PROVENTIL) (2.5 MG/3ML) 0.083% NEBULIZER SOLUTION    Take 3 mLs (2.5 mg total) by nebulization 2 (two) times daily as needed for wheezing or shortness of breath.   ALLOPURINOL (ZYLOPRIM) 300 MG TABLET    Take 300 mg by mouth daily. For gout   ATORVASTATIN (LIPITOR) 20 MG TABLET    Take 1 tablet (20 mg total) by mouth daily at 6 PM.   CARVEDILOL (COREG) 12.5 MG TABLET    Take 12.5 mg by mouth 2 (two) times daily with a meal.   DULOXETINE 40 MG CPEP    Take 40 mg by mouth daily.   GABAPENTIN (NEURONTIN) 250 MG/5ML SOLUTION    Take 300 mg by mouth daily.   GERIATRIC MULTIVITAMINS-MINERALS (ELDERTONIC/GEVRABON) ELIX    Take 15 mLs by mouth 2 (two) times daily.   HYDRALAZINE (APRESOLINE) 50 MG TABLET    Place  50 mg into feeding tube 4 (four) times daily.    LISINOPRIL (PRINIVIL,ZESTRIL) 20 MG TABLET    Take 1 tablet (20 mg total) by mouth 2 (two) times daily.   METFORMIN (GLUCOPHAGE) 500 MG TABLET    Take 500 mg by mouth daily. 6AM   NITROGLYCERIN (NITROSTAT) 0.4 MG SL TABLET    Place 0.4 mg under the tongue every 5 (five) minutes as needed. Chest pain   NUTRITIONAL SUPPLEMENT LIQD    Take 120 mLs by mouth 2 (two) times daily. NSA MedPass between breakfast and lunch and lunch and dinner   QUETIAPINE (SEROQUEL) 25 MG TABLET    Take 1 tablet (25 mg total) by mouth at bedtime.   SENNA-DOCUSATE (SENOKOT-S) 8.6-50 MG TABLET    Take 1 tablet by mouth 2 (two) times daily.   TAMSULOSIN (FLOMAX) 0.4 MG CAPS CAPSULE    Take 0.4 mg by mouth daily.   Modified Medications   No medications on file  Discontinued Medications   AMINO ACIDS-PROTEIN HYDROLYS (FEEDING SUPPLEMENT, PRO-STAT  SUGAR FREE 64,) LIQD    Place 30 mLs into feeding tube 2 (two) times daily.   CARVEDILOL (COREG) 6.25 MG TABLET    Take 1 tablet (6.25 mg total) by mouth 2 (two) times daily with a meal.   GABAPENTIN (NEURONTIN) 250 MG/5ML SOLUTION    Place 6 mLs (300 mg total) into feeding tube daily.   NUTRITIONAL SUPPLEMENTS (FEEDING SUPPLEMENT, JEVITY 1.5 CAL,) LIQD    Place 237 mLs into feeding tube. Six times daily      No Known Allergies   REVIEW OF SYSTEMS:  GENERAL: no change in appetite, no fatigue, no weight changes, no fever, chills or weakness EYES: Denies change in vision, dry eyes, eye pain, itching or discharge EARS: Denies  ringing in ears, or earache NOSE: Denies nasal congestion or epistaxis MOUTH and THROAT: Denies oral discomfort, gingival pain or bleeding, pain from teeth or hoarseness   RESPIRATORY: no cough, SOB, DOE, wheezing, hemoptysis CARDIAC: no chest pain, edema or palpitations GI: no abdominal pain, diarrhea, constipation, heart burn, nausea or vomiting GU: Denies dysuria, frequency, hematuria, incontinence, or discharge PSYCHIATRIC: Denies feeling of depression or anxiety. No report of hallucinations, insomnia, paranoia, or agitation     PHYSICAL EXAMINATION  GENERAL APPEARANCE: Well nourished. In no acute distress. Obese SKIN:  Skin is warm and dry.  HEAD: Normal in size and contour. No evidence of trauma EYES: Lids open and close normally. No blepharitis, entropion or ectropion. PERRL. Conjunctivae are clear and sclerae are white. Lenses are without opacity EARS: Pinnae are normal. Hard of hearing. MOUTH and THROAT: Lips are without lesions. Oral mucosa is moist and without lesions. Tongue is normal in shape, size, and color and without lesions NECK: supple, trachea midline, no neck masses, no thyroid tenderness, no thyromegaly LYMPHATICS: no LAN in the neck, no supraclavicular LAN RESPIRATORY: breathing is even & unlabored, BS CTAB CARDIAC: RRR, no murmur,no  extra heart sounds, no edema GI: abdomen soft, normal BS, no masses, no tenderness, no hepatomegaly, no splenomegaly, has PEG tube on LUQ EXTREMITIES:  Able to move 4 extremities PSYCHIATRIC: Alert to person and disoriented to time and place. Affect and behavior are appropriate  LABS/RADIOLOGY: Labs reviewed: Basic Metabolic Panel:  Recent Labs  16/10/96 0844 07/04/16 0610 07/05/16 0633 07/19/16   NA 140 141 143 138   K 3.9 4.2 3.9 4.9   CL 104 105 107  --    CO2 25 26 29   --  GLUCOSE 109* 127* 139*  --    BUN 22* 24* 23* 34*   CREATININE 0.89 0.95 0.93 0.9   CALCIUM 9.0 8.8* 8.9  --     Liver Function Tests:  Recent Labs  01/09/16 1033 06/24/16 1352 07/19/16  AST 18 19 54*  ALT 15* 17  --   ALKPHOS 54 64 86  BILITOT 0.8 0.7  --   PROT 7.1 8.1  --   ALBUMIN 3.8 4.2  --    CBC:  Recent Labs  06/24/16 1352  07/03/16 0844 07/04/16 0610 07/05/16 0633 07/19/16 07/27/16   WBC 13.1*  < > 12.0* 6.4 11.6* 15.0 14.1   NEUTROABS 10.6*  --   --   --   --  12 11   HGB 13.9  < > 12.5* 12.0* 11.6* 12.5* 11.8*   HCT 42.4  < > 39.1 33.4* 36.6* 39* 37*   MCV 90.2  < > 92.0 90.5 90.8  --   --    PLT 202  < > 180 393 203 253 182   < > = values in this interval not displayed.  Lipid Panel:  Recent Labs  06/28/16 0347  HDL 35*   CBG:  Recent Labs  07/06/16 0758 07/06/16 1218 07/06/16 1625  GLUCAP 136* 104* 120*      Ir Gastrostomy Tube Removal  Result Date: 09/07/2016 INDICATION: Dysphagia, resolved.  Request removal of gastrostomy tube. EXAM: BEDSIDE REMOVAL OF GASTROSTOMY TUBE COMPARISON:  None. MEDICATIONS: None. CONTRAST:  None FLUOROSCOPY TIME:  None COMPLICATIONS: None immediate. PROCEDURE: A time-out was performed prior to the initiation of the procedure. Balloon retention gastrostomy tube identified in the left upper quadrant. Approximately 8 cc of saline was removed from the balloon. Once the balloon was deflated, gastrostomy tube was removed without  difficulty. A dressing was placed. The patient tolerated the procedure well without immediate postprocedural complication. IMPRESSION: Successful bedside removal of pull-through gastrostomy tube without complication. Read by: Brayton ElKevin Bruning PA-C Electronically Signed   By: Gilmer MorJaime  Wagner D.O.   On: 09/07/2016 13:33    ASSESSMENT/PLAN:  ICH - follow-up with neurology; continue to avoid anticoagulation and anti-thrombolytics; fall precautions; check CBC   Hypertension - well controlled; continue Coreg 6.25 mg 1 tab by a PEG tube every 12 hours, lisinopril 20 mg 1 tab by a PEG tube twice a day, hydralazine 50 mg via PEG tube 3 times a day  Diabetes mellitus, type II - continue Glucophage 500 mg 1 tab via PEG tube daily Lab Results  Component Value Date   HGBA1C 6.4 07/19/2016   Constipation - continue senna S 1 tab via PEG tube twice a day  Gout - continue allopurinol 300 mg by PEG tube daily  Depression - continue Cymbalta 40 mg by a PEG tube daily  Neuropathy - continue gabapentin 250 mg/5 ML gives 6 mL by a PEG tube daily  Chronic systolic CHF - continue Coreg 9.606.25 mg by a PEG tube every 12 hours, lisinopril 20 mg 1 tab via PEG tube twice a day and hydralazine 50 mg via PEG tube 3 times a day; check BMP  BPH - continue Flomax 0.4 mg 1 capsule by a PEG tube daily  Hyperlipidemia - continue Lipitor 20 mg 1 tab via PEG tube daily at bedtime Lab Results  Component Value Date   CHOL 216 (H) 06/28/2016   HDL 35 (L) 06/28/2016   LDLCALC 158 (H) 06/28/2016   TRIG 114 06/28/2016   CHOLHDL 6.2 06/28/2016  Dementia with behavioral disturbance - continue supportive care; fall precautions; Seroquel 25 mg via PEG tube daily at bedtime      Goals of care:  Short-term rehabilitation    Kenard Gower, NP [facility] [phone]

## 2016-09-06 LAB — CBC AND DIFFERENTIAL
HEMATOCRIT: 39 % — AB (ref 41–53)
HEMOGLOBIN: 12.5 g/dL — AB (ref 13.5–17.5)
PLATELETS: 235 10*3/uL (ref 150–399)
WBC: 7.2 10^3/mL

## 2016-09-06 LAB — BASIC METABOLIC PANEL
BUN: 8 mg/dL (ref 4–21)
Creatinine: 0.8 mg/dL (ref 0.6–1.3)
GLUCOSE: 112 mg/dL
Potassium: 3.7 mmol/L (ref 3.4–5.3)
SODIUM: 145 mmol/L (ref 137–147)

## 2016-09-07 ENCOUNTER — Ambulatory Visit (HOSPITAL_COMMUNITY)
Admission: RE | Admit: 2016-09-07 | Discharge: 2016-09-07 | Disposition: A | Discharge status: Home / Self Care | Payer: Medicare Other | Source: Ambulatory Visit | Attending: Adult Health | Admitting: Adult Health

## 2016-09-07 ENCOUNTER — Encounter (HOSPITAL_COMMUNITY): Payer: Self-pay | Admitting: Interventional Radiology

## 2016-09-07 DIAGNOSIS — R1310 Dysphagia, unspecified: Secondary | ICD-10-CM | POA: Insufficient documentation

## 2016-09-07 DIAGNOSIS — Z431 Encounter for attention to gastrostomy: Secondary | ICD-10-CM | POA: Insufficient documentation

## 2016-09-07 HISTORY — PX: IR GENERIC HISTORICAL: IMG1180011

## 2016-09-10 DIAGNOSIS — R278 Other lack of coordination: Secondary | ICD-10-CM | POA: Diagnosis not present

## 2016-09-23 ENCOUNTER — Non-Acute Institutional Stay (SKILLED_NURSING_FACILITY): Payer: Medicare Other | Admitting: Adult Health

## 2016-09-23 ENCOUNTER — Encounter: Payer: Self-pay | Admitting: Adult Health

## 2016-09-23 DIAGNOSIS — I10 Essential (primary) hypertension: Secondary | ICD-10-CM | POA: Diagnosis not present

## 2016-09-23 NOTE — Progress Notes (Signed)
Patient ID: Candise CheLeon Emanuele, male   DOB: 09/14/1935, 81 y.o.   MRN: [id number]    DATE:    09/23/16   MRN:  [id number]  BIRTHDAY: 12/08/1934  Facility:  Nursing Home Location:  [facility]  Nursing Home Room Number: 306-B  LEVEL OF CARE:  SNF (31)  Contact Information    Name Relation Home Work Mobile   Lithia SpringsBryant,Thelma Spouse [phone]     Sheron NightingaleBryant,Lawrence Son [phone]     Johnston EbbsWilson,Sandra Daughter [phone]         Code Status History    Date Active Date Inactive Code Status Order ID Comments User Context   06/24/2016 11:08 PM 07/07/2016  8:40 AM Full Code [id number]  Ulice DashDavid R Smith, PA-C Inpatient   02/08/2015 10:11 PM 02/21/2015 11:59 PM Full Code [id number]  Lunette Standssvaldo A Camilo, MD Inpatient   04/22/2013  4:29 PM 04/23/2013 11:53 AM Full Code [id number]  Gilda Creasehristopher J. Pollina, MD ED       Chief Complaint  Patient presents with  . Acute Visit    Hypertension    HISTORY OF PRESENT ILLNESS:  This is a 81 year old male who is being seen for an acute visit. He was reported to have BP 195/110 last night and was given extra dose of hydralazine 50 mg. BP this morning was 185/101. He is currently taking Hydralazine, lisinopril and Coreg daily for hypertension. No complaints of headache at this time.  [facility] with admission date 06/24/16 through 07/06/16. He had right frontal intracranial hemorrhage with midline shift. It was thought to be from uncontrolled hypertension. Neurology was consulted. He had dysphagia and had PEG tube placement. He was treated for gout flare with Colchicine.  His PEG tube was recently discontinued and has been eating PO.   PAST MEDICAL HISTORY:  Past Medical History:  Diagnosis Date  . Alcohol abuse, in remission   . Atrial flutter (HCC)   . Back pain   . Cervicalgia   . Coronary artery disease   . Coronary atherosclerosis of unspecified type of vessel, native or graft    . Dementia   . Depression   . Diabetes mellitus without complication (HCC)   . Disturbance of skin sensation   . Hypertension   . MI, old   . Reflux   . Stroke (HCC)   . Unspecified hearing loss   . Unspecified sleep apnea      CURRENT MEDICATIONS: Reviewed  Patient's Medications  New Prescriptions   No medications on file  Previous Medications   ACETAMINOPHEN (TYLENOL) 325 MG TABLET    Take 650 mg by mouth every 4 (four) hours as needed for mild pain.   ALBUTEROL (PROVENTIL) (2.5 MG/3ML) 0.083% NEBULIZER SOLUTION    Take 3 mLs (2.5 mg total) by nebulization 2 (two) times daily as needed for wheezing or shortness of breath.   ALLOPURINOL (ZYLOPRIM) 300 MG TABLET    Take 300 mg by mouth daily. For gout   ATORVASTATIN (LIPITOR) 20 MG TABLET    Take 1 tablet (20 mg total) by mouth daily at 6 PM.   CARVEDILOL (COREG) 12.5 MG TABLET    Take 12.5 mg by mouth 2 (two) times daily with a meal.   DULOXETINE 40 MG CPEP    Take 40 mg by mouth daily.   GABAPENTIN (NEURONTIN) 250 MG/5ML SOLUTION    Take 300 mg by mouth daily.   GERIATRIC MULTIVITAMINS-MINERALS (ELDERTONIC/GEVRABON) Frederich ChaELIX  Take 15 mLs by mouth 2 (two) times daily.   HYDRALAZINE (APRESOLINE) 50 MG TABLET    Place 50 mg into feeding tube 4 (four) times daily.    LISINOPRIL (PRINIVIL,ZESTRIL) 20 MG TABLET    Take 1 tablet (20 mg total) by mouth 2 (two) times daily.   METFORMIN (GLUCOPHAGE) 500 MG TABLET    Take 500 mg by mouth daily. 6AM   NITROGLYCERIN (NITROSTAT) 0.4 MG SL TABLET    Place 0.4 mg under the tongue every 5 (five) minutes as needed. Chest pain   NUTRITIONAL SUPPLEMENT LIQD    Take 120 mLs by mouth 2 (two) times daily. NSA MedPass between breakfast and lunch and lunch and dinner   QUETIAPINE (SEROQUEL) 25 MG TABLET    Take 1 tablet (25 mg total) by mouth at bedtime.   SENNA-DOCUSATE (SENOKOT-S) 8.6-50 MG TABLET    Take 1 tablet by mouth 2 (two) times daily.   TAMSULOSIN (FLOMAX) 0.4 MG CAPS CAPSULE    Take 0.4 mg by  mouth daily.   Modified Medications   No medications on file  Discontinued Medications   CARVEDILOL (COREG) 6.25 MG TABLET    Take 1 tablet (6.25 mg total) by mouth 2 (two) times daily with a meal.     No Known Allergies   REVIEW OF SYSTEMS:  GENERAL: no change in appetite, no fatigue, no weight changes, no fever, chills or weakness EYES: Denies change in vision, dry eyes, eye pain, itching or discharge EARS: Denies change in hearing, ringing in ears, or earache NOSE: Denies nasal congestion or epistaxis MOUTH and THROAT: Denies oral discomfort, gingival pain or bleeding, pain from teeth or hoarseness   RESPIRATORY: no cough, SOB, DOE, wheezing, hemoptysis CARDIAC: no chest pain, edema or palpitations GI: no abdominal pain, diarrhea, constipation, heart burn, nausea or vomiting GU: Denies dysuria, frequency, hematuria, incontinence, or discharge PSYCHIATRIC: Denies feeling of depression or anxiety. No report of hallucinations, insomnia, paranoia, or agitation     PHYSICAL EXAMINATION  GENERAL APPEARANCE: Well nourished. In no acute distress. Obese SKIN:  Skin is warm and dry.  HEAD: Normal in size and contour. No evidence of trauma EYES: Lids open and close normally. No blepharitis, entropion or ectropion. PERRL. Conjunctivae are clear and sclerae are white. Lenses are without opacity EARS: Pinnae are normal. Hard of hearing MOUTH and THROAT: Lips are without lesions. Oral mucosa is moist and without lesions. Tongue is normal in shape, size, and color and without lesions NECK: supple, trachea midline, no neck masses, no thyroid tenderness, no thyromegaly LYMPHATICS: no LAN in the neck, no supraclavicular LAN RESPIRATORY: breathing is even & unlabored, BS CTAB CARDIAC: RRR, no murmur,no extra heart sounds, no edema GI: abdomen soft, normal BS, no masses, no tenderness, no hepatomegaly, no splenomegaly EXTREMITIES:  Able to move 4 extremities PSYCHIATRIC: Alert to person and  disoriented to time and place. Affect and behavior are appropriate  LABS/RADIOLOGY: Labs reviewed: Basic Metabolic Panel:  Recent Labs  14/78/2909/30/17 0844 07/04/16 0610 07/05/16 0633 07/19/16 09/06/16  NA 140 141 143 138 145  K 3.9 4.2 3.9 4.9 3.7  CL 104 105 107  --   --   CO2 25 26 29   --   --   GLUCOSE 109* 127* 139*  --   --   BUN 22* 24* 23* 34* 8  CREATININE 0.89 0.95 0.93 0.9 0.8  CALCIUM 9.0 8.8* 8.9  --   --    Liver Function Tests:  Recent Labs  01/09/16 1033 06/24/16 1352 07/19/16  AST 18 19 54*  ALT 15* 17  --   ALKPHOS 54 64 86  BILITOT 0.8 0.7  --   PROT 7.1 8.1  --   ALBUMIN 3.8 4.2  --    CBC:  Recent Labs  06/24/16 1352  07/03/16 0844 07/04/16 0610 07/05/16 0633 07/19/16 07/27/16 09/06/16  WBC 13.1*  < > 12.0* 6.4 11.6* 15.0 14.1 7.2  NEUTROABS 10.6*  --   --   --   --  12 11  --   HGB 13.9  < > 12.5* 12.0* 11.6* 12.5* 11.8* 12.5*  HCT 42.4  < > 39.1 33.4* 36.6* 39* 37* 39*  MCV 90.2  < > 92.0 90.5 90.8  --   --   --   PLT 202  < > 180 393 203 253 182 235  < > = values in this interval not displayed.  Lipid Panel:  Recent Labs  06/28/16 0347  HDL 35*   CBG:  Recent Labs  07/06/16 0758 07/06/16 1218 07/06/16 1625  GLUCAP 136* 104* 120*      Ir Gastrostomy Tube Removal  Result Date: 09/07/2016 INDICATION: Dysphagia, resolved.  Request removal of gastrostomy tube. EXAM: BEDSIDE REMOVAL OF GASTROSTOMY TUBE COMPARISON:  None. MEDICATIONS: None. CONTRAST:  None FLUOROSCOPY TIME:  None COMPLICATIONS: None immediate. PROCEDURE: A time-out was performed prior to the initiation of the procedure. Balloon retention gastrostomy tube identified in the left upper quadrant. Approximately 8 cc of saline was removed from the balloon. Once the balloon was deflated, gastrostomy tube was removed without difficulty. A dressing was placed. The patient tolerated the procedure well without immediate postprocedural complication. IMPRESSION: Successful bedside  removal of pull-through gastrostomy tube without complication. Read by: Brayton El PA-C Electronically Signed   By: Gilmer Mor D.O.   On: 09/07/2016 13:33    ASSESSMENT/PLAN:  Hypertension - increase hydralazine 50 mg from 3 times a day to 4 times a day and increase Coreg from 6.25 mg to 12.5 mg by mouth twice a day; BP/HR twice a day 1 week ; give hydralazine 50 mg 1 tab by mouth 1 now       Kenard Gower, NP [facility] [phone]

## 2016-09-23 NOTE — Progress Notes (Signed)
This encounter was created in error - please disregard.

## 2016-10-06 DIAGNOSIS — M6281 Muscle weakness (generalized): Secondary | ICD-10-CM | POA: Diagnosis not present

## 2016-10-06 DIAGNOSIS — R1312 Dysphagia, oropharyngeal phase: Secondary | ICD-10-CM | POA: Diagnosis not present

## 2016-10-07 DIAGNOSIS — R1312 Dysphagia, oropharyngeal phase: Secondary | ICD-10-CM | POA: Diagnosis not present

## 2016-10-07 DIAGNOSIS — M6281 Muscle weakness (generalized): Secondary | ICD-10-CM | POA: Diagnosis not present

## 2016-10-08 ENCOUNTER — Encounter: Payer: Self-pay | Admitting: Adult Health

## 2016-10-08 ENCOUNTER — Non-Acute Institutional Stay (SKILLED_NURSING_FACILITY): Payer: Medicare Other | Admitting: Adult Health

## 2016-10-08 DIAGNOSIS — M6281 Muscle weakness (generalized): Secondary | ICD-10-CM | POA: Diagnosis not present

## 2016-10-08 DIAGNOSIS — F332 Major depressive disorder, recurrent severe without psychotic features: Secondary | ICD-10-CM | POA: Diagnosis not present

## 2016-10-08 DIAGNOSIS — M109 Gout, unspecified: Secondary | ICD-10-CM

## 2016-10-08 DIAGNOSIS — R1312 Dysphagia, oropharyngeal phase: Secondary | ICD-10-CM | POA: Diagnosis not present

## 2016-10-08 DIAGNOSIS — I10 Essential (primary) hypertension: Secondary | ICD-10-CM

## 2016-10-08 DIAGNOSIS — G629 Polyneuropathy, unspecified: Secondary | ICD-10-CM | POA: Diagnosis not present

## 2016-10-08 NOTE — Progress Notes (Signed)
DATE:  10/08/2016   MRN:  [id number]  BIRTHDAY: [birth year]  Facility:  Nursing Home Location:  [facility]  Nursing Home Room Number: 306-B  LEVEL OF CARE:  SNF (31)  Contact Information    Name Relation Home Work Mobile   [place] Spouse [phone]     Emitt, Maglione [phone]     Johnston Ebbs Daughter [phone]         Code Status History    Date Active Date Inactive Code Status Order ID Comments User Context   06/24/2016 11:08 PM 07/07/2016  8:40 AM Full Code [id number]  Ulice Dash, PA-C Inpatient   02/08/2015 10:11 PM 02/21/2015 11:59 PM Full Code [id number]  Lunette Stands, MD Inpatient   04/22/2013  4:29 PM 04/23/2013 11:53 AM Full Code [id number]  Gilda Crease, MD ED       Chief Complaint  Patient presents with  . Medical Management of Chronic Issues    HISTORY OF PRESENT ILLNESS:  This is a 81-YO male seen for a routine visit.  His PEG tube was removed on 09/06/16 since he is now able to swallow.  He is now taking all meds by mouth. BPs has been elevated so hydralazine and Coreg dosage were adjusted.  BPs are now stable.    PAST MEDICAL HISTORY:  Past Medical History:  Diagnosis Date  . Alcohol abuse, in remission   . Atrial flutter (HCC)   . Back pain   . Cervicalgia   . Coronary artery disease   . Coronary atherosclerosis of unspecified type of vessel, native or graft   . Dementia   . Depression   . Diabetes mellitus without complication (HCC)   . Disturbance of skin sensation   . Hypertension   . MI, old   . Reflux   . Stroke (HCC)   . Unspecified hearing loss   . Unspecified sleep apnea      CURRENT MEDICATIONS: Reviewed  Patient's Medications  New Prescriptions   No medications on file  Previous Medications   ACETAMINOPHEN (TYLENOL) 325 MG TABLET    Take 650 mg by mouth every 4 (four) hours as needed for mild pain.   ALBUTEROL (PROVENTIL) (2.5 MG/3ML) 0.083% NEBULIZER SOLUTION    Take 3 mLs (2.5  mg total) by nebulization 2 (two) times daily as needed for wheezing or shortness of breath.   ALLOPURINOL (ZYLOPRIM) 300 MG TABLET    Take 300 mg by mouth daily. For gout   ATORVASTATIN (LIPITOR) 20 MG TABLET    Take 1 tablet (20 mg total) by mouth daily at 6 PM.   BISACODYL (DULCOLAX) 10 MG SUPPOSITORY    Place 10 mg rectally daily as needed for moderate constipation. x2 days   CARVEDILOL (COREG) 12.5 MG TABLET    Take 12.5 mg by mouth 2 (two) times daily with a meal.    DULOXETINE 40 MG CPEP    Take 40 mg by mouth daily.   GABAPENTIN (NEURONTIN) 250 MG/5ML SOLUTION    Take 300 mg by mouth daily.   GERIATRIC MULTIVITAMINS-MINERALS (ELDERTONIC/GEVRABON) ELIX    Take 15 mLs by mouth 2 (two) times daily.   HYDRALAZINE (APRESOLINE) 50 MG TABLET    Take 50 mg by mouth 4 (four) times daily.    LISINOPRIL (PRINIVIL,ZESTRIL) 20 MG TABLET    Take 1 tablet (20 mg total) by mouth 2 (two) times daily.   LORATADINE (CLARITIN) 10 MG TABLET    Take 10 mg by mouth  daily as needed for allergies.   MAGNESIUM HYDROXIDE (MILK OF MAGNESIA) 400 MG/5ML SUSPENSION    Take 45 mLs by mouth daily as needed for mild constipation.   METFORMIN (GLUCOPHAGE) 500 MG TABLET    Take 500 mg by mouth daily. 6AM   NITROGLYCERIN (NITROSTAT) 0.4 MG SL TABLET    Place 0.4 mg under the tongue every 5 (five) minutes as needed. Chest pain   NUTRITIONAL SUPPLEMENT LIQD    Take 120 mLs by mouth 2 (two) times daily. NSA MedPass between breakfast and lunch and lunch and dinner   QUETIAPINE (SEROQUEL) 25 MG TABLET    Take 1 tablet (25 mg total) by mouth at bedtime.   SENNA-DOCUSATE (SENOKOT-S) 8.6-50 MG TABLET    Take 1 tablet by mouth 2 (two) times daily.   TAMSULOSIN (FLOMAX) 0.4 MG CAPS CAPSULE    Take 0.4 mg by mouth daily.   Modified Medications   No medications on file  Discontinued Medications   No medications on file     No Known Allergies   REVIEW OF SYSTEMS:  GENERAL: no change in appetite, no fatigue, no weight changes,  no fever, chills or weakness EYES: Denies change in vision, dry eyes, eye pain, itching or discharge EARS: Denies change in hearing, ringing in ears, or earache NOSE: Denies nasal congestion or epistaxis MOUTH and THROAT: Denies oral discomfort, gingival pain or bleeding, pain from teeth or hoarseness   RESPIRATORY: no cough, SOB, DOE, wheezing, hemoptysis CARDIAC: no chest pain, edema or palpitations GI: no abdominal pain, diarrhea, constipation, heart burn, nausea or vomiting GU: Denies dysuria, frequency, hematuria, incontinence, or discharge MUSCULOSKELETAL: Denies joit pain, muscle pain, back pain, restricted movement, or unusual weakness CIRCULATION: Denies claudication, edema of legs, varicosities, or cold extremities NEUROLOGICAL: Denies dizziness, syncope, numbness, or headache PSYCHIATRIC: Denies feeling of depression or anxiety. No report of hallucinations, insomnia, paranoia, or agitation   PHYSICAL EXAMINATION  GENERAL APPEARANCE: Well nourished. In no acute distress. Obese SKIN:  Skin is warm and dry.  HEAD: Normal in size and contour. No evidence of trauma EYES: Lids open and close normally. No blepharitis, entropion or ectropion. PERRL. Conjunctivae are clear and sclerae are white. Lenses are without opacity EARS: Pinnae are normal. Hard of hearing MOUTH and THROAT: Lips are without lesions. Oral mucosa is moist and without lesions. Tongue is normal in shape, size, and color and without lesions NECK: supple, trachea midline, no neck masses, no thyroid tenderness, no thyromegaly LYMPHATICS: no LAN in the neck, no supraclavicular LAN RESPIRATORY: breathing is even & unlabored, BS CTAB CARDIAC: RRR, no murmur,no extra heart sounds, no edema GI: abdomen soft, normal BS, no masses, no tenderness, no hepatomegaly, no splenomegaly EXTREMITIES:  Able to move X 4 extremities PSYCHIATRIC: Alert to person, disoriented to time and place. Affect and behavior are  appropriate    LABS/RADIOLOGY: Labs reviewed: Basic Metabolic Panel:  Recent Labs  98/11/91 0844 07/04/16 0610 07/05/16 0633 07/19/16 09/06/16  NA 140 141 143 138 145  K 3.9 4.2 3.9 4.9 3.7  CL 104 105 107  --   --   CO2 25 26 29   --   --   GLUCOSE 109* 127* 139*  --   --   BUN 22* 24* 23* 34* 8  CREATININE 0.89 0.95 0.93 0.9 0.8  CALCIUM 9.0 8.8* 8.9  --   --    Liver Function Tests:  Recent Labs  01/09/16 1033 06/24/16 1352 07/19/16  AST 18 19 54*  ALT  15* 17  --   ALKPHOS 54 64 86  BILITOT 0.8 0.7  --   PROT 7.1 8.1  --   ALBUMIN 3.8 4.2  --     CBC:  Recent Labs  06/24/16 1352  07/03/16 0844 07/04/16 0610 07/05/16 0633 07/19/16 07/27/16 09/06/16  WBC 13.1*  < > 12.0* 6.4 11.6* 15.0 14.1 7.2  NEUTROABS 10.6*  --   --   --   --  12 11  --   HGB 13.9  < > 12.5* 12.0* 11.6* 12.5* 11.8* 12.5*  HCT 42.4  < > 39.1 33.4* 36.6* 39* 37* 39*  MCV 90.2  < > 92.0 90.5 90.8  --   --   --   PLT 202  < > 180 393 203 253 182 235  < > = values in this interval not displayed. Lipid Panel:  Recent Labs  06/28/16 0347  HDL 35*   CBG:  Recent Labs  07/06/16 0758 07/06/16 1218 07/06/16 1625  GLUCAP 136* 104* 120*      ASSESSMENT/PLAN:  Neuropathy - Stable; continue gabapentin 250 mg/5 ML give 6 ML by mouth daily  Essential hypertension - well controlled; recently adjusted Coreg and hydralazine and continue Lisinopril  Gout  - as continue allopurinol 300 mg 1 tab by mouth daily    Major depression - continue continue Cymbalta 40 mg 1 capsule by mouth daily      Goals of care:  Long-term care     Jendayi Berling C. Medina-Vargas - NP [facility] [phone]

## 2016-10-11 DIAGNOSIS — M6281 Muscle weakness (generalized): Secondary | ICD-10-CM | POA: Diagnosis not present

## 2016-10-11 DIAGNOSIS — R1312 Dysphagia, oropharyngeal phase: Secondary | ICD-10-CM | POA: Diagnosis not present

## 2016-10-12 DIAGNOSIS — M6281 Muscle weakness (generalized): Secondary | ICD-10-CM | POA: Diagnosis not present

## 2016-10-12 DIAGNOSIS — R1312 Dysphagia, oropharyngeal phase: Secondary | ICD-10-CM | POA: Diagnosis not present

## 2016-10-13 DIAGNOSIS — R1312 Dysphagia, oropharyngeal phase: Secondary | ICD-10-CM | POA: Diagnosis not present

## 2016-10-13 DIAGNOSIS — M6281 Muscle weakness (generalized): Secondary | ICD-10-CM | POA: Diagnosis not present

## 2016-10-14 DIAGNOSIS — M6281 Muscle weakness (generalized): Secondary | ICD-10-CM | POA: Diagnosis not present

## 2016-10-14 DIAGNOSIS — R1312 Dysphagia, oropharyngeal phase: Secondary | ICD-10-CM | POA: Diagnosis not present

## 2016-10-15 DIAGNOSIS — R1312 Dysphagia, oropharyngeal phase: Secondary | ICD-10-CM | POA: Diagnosis not present

## 2016-10-15 DIAGNOSIS — M6281 Muscle weakness (generalized): Secondary | ICD-10-CM | POA: Diagnosis not present

## 2016-10-18 DIAGNOSIS — M6281 Muscle weakness (generalized): Secondary | ICD-10-CM | POA: Diagnosis not present

## 2016-10-18 DIAGNOSIS — R1312 Dysphagia, oropharyngeal phase: Secondary | ICD-10-CM | POA: Diagnosis not present

## 2016-10-19 DIAGNOSIS — M6281 Muscle weakness (generalized): Secondary | ICD-10-CM | POA: Diagnosis not present

## 2016-10-19 DIAGNOSIS — R1312 Dysphagia, oropharyngeal phase: Secondary | ICD-10-CM | POA: Diagnosis not present

## 2016-10-20 DIAGNOSIS — M6281 Muscle weakness (generalized): Secondary | ICD-10-CM | POA: Diagnosis not present

## 2016-10-20 DIAGNOSIS — R1312 Dysphagia, oropharyngeal phase: Secondary | ICD-10-CM | POA: Diagnosis not present

## 2016-10-21 DIAGNOSIS — M6281 Muscle weakness (generalized): Secondary | ICD-10-CM | POA: Diagnosis not present

## 2016-10-21 DIAGNOSIS — R1312 Dysphagia, oropharyngeal phase: Secondary | ICD-10-CM | POA: Diagnosis not present

## 2016-10-22 DIAGNOSIS — R1312 Dysphagia, oropharyngeal phase: Secondary | ICD-10-CM | POA: Diagnosis not present

## 2016-10-22 DIAGNOSIS — M6281 Muscle weakness (generalized): Secondary | ICD-10-CM | POA: Diagnosis not present

## 2016-10-25 DIAGNOSIS — M6281 Muscle weakness (generalized): Secondary | ICD-10-CM | POA: Diagnosis not present

## 2016-10-25 DIAGNOSIS — R1312 Dysphagia, oropharyngeal phase: Secondary | ICD-10-CM | POA: Diagnosis not present

## 2016-10-26 DIAGNOSIS — M6281 Muscle weakness (generalized): Secondary | ICD-10-CM | POA: Diagnosis not present

## 2016-10-26 DIAGNOSIS — R1312 Dysphagia, oropharyngeal phase: Secondary | ICD-10-CM | POA: Diagnosis not present

## 2016-10-27 DIAGNOSIS — M6281 Muscle weakness (generalized): Secondary | ICD-10-CM | POA: Diagnosis not present

## 2016-10-27 DIAGNOSIS — R1312 Dysphagia, oropharyngeal phase: Secondary | ICD-10-CM | POA: Diagnosis not present

## 2016-10-28 DIAGNOSIS — M6281 Muscle weakness (generalized): Secondary | ICD-10-CM | POA: Diagnosis not present

## 2016-10-28 DIAGNOSIS — R1312 Dysphagia, oropharyngeal phase: Secondary | ICD-10-CM | POA: Diagnosis not present

## 2016-10-29 DIAGNOSIS — R1312 Dysphagia, oropharyngeal phase: Secondary | ICD-10-CM | POA: Diagnosis not present

## 2016-10-29 DIAGNOSIS — M6281 Muscle weakness (generalized): Secondary | ICD-10-CM | POA: Diagnosis not present

## 2016-11-02 DIAGNOSIS — M6281 Muscle weakness (generalized): Secondary | ICD-10-CM | POA: Diagnosis not present

## 2016-11-02 DIAGNOSIS — R1312 Dysphagia, oropharyngeal phase: Secondary | ICD-10-CM | POA: Diagnosis not present

## 2016-11-03 DIAGNOSIS — M6281 Muscle weakness (generalized): Secondary | ICD-10-CM | POA: Diagnosis not present

## 2016-11-03 DIAGNOSIS — R1312 Dysphagia, oropharyngeal phase: Secondary | ICD-10-CM | POA: Diagnosis not present

## 2016-11-04 DIAGNOSIS — R1312 Dysphagia, oropharyngeal phase: Secondary | ICD-10-CM | POA: Diagnosis not present

## 2016-11-06 DIAGNOSIS — R1312 Dysphagia, oropharyngeal phase: Secondary | ICD-10-CM | POA: Diagnosis not present

## 2016-11-08 DIAGNOSIS — R1312 Dysphagia, oropharyngeal phase: Secondary | ICD-10-CM | POA: Diagnosis not present

## 2016-11-09 ENCOUNTER — Encounter: Payer: Self-pay | Admitting: Adult Health

## 2016-11-09 ENCOUNTER — Non-Acute Institutional Stay (SKILLED_NURSING_FACILITY): Payer: Medicare Other | Admitting: Adult Health

## 2016-11-09 DIAGNOSIS — R1312 Dysphagia, oropharyngeal phase: Secondary | ICD-10-CM | POA: Diagnosis not present

## 2016-11-09 DIAGNOSIS — I5022 Chronic systolic (congestive) heart failure: Secondary | ICD-10-CM | POA: Diagnosis not present

## 2016-11-09 DIAGNOSIS — N400 Enlarged prostate without lower urinary tract symptoms: Secondary | ICD-10-CM

## 2016-11-09 DIAGNOSIS — K5900 Constipation, unspecified: Secondary | ICD-10-CM

## 2016-11-09 DIAGNOSIS — E785 Hyperlipidemia, unspecified: Secondary | ICD-10-CM | POA: Diagnosis not present

## 2016-11-09 NOTE — Progress Notes (Signed)
Patient ID: Candise CheLeon Coddington, male   DOB: 12/13/1934, 82 y.o.   MRN: [id number]    DATE:  11/09/2016   MRN:  [id number]  BIRTHDAY: 02/06/1935  Facility:  Nursing Home Location:  [facility]  Nursing Home Room Number: 306-B  LEVEL OF CARE:  SNF (31)  Contact Information    Name Relation Home Work Mobile   MiramarBryant,Thelma Spouse [phone]     Sheron NightingaleBryant,Lawrence Son [phone]     Johnston EbbsWilson,Sandra Daughter [phone]         Code Status History    Date Active Date Inactive Code Status Order ID Comments User Context   06/24/2016 11:08 PM 07/07/2016  8:40 AM Full Code [id number]  Ulice DashDavid R Smith, PA-C Inpatient   02/08/2015 10:11 PM 02/21/2015 11:59 PM Full Code [id number]  Lunette Standssvaldo A Camilo, MD Inpatient   04/22/2013  4:29 PM 04/23/2013 11:53 AM Full Code [id number]  Gilda Creasehristopher J. Pollina, MD ED       Chief Complaint  Patient presents with  . Medical Management of Chronic Issues    HISTORY OF PRESENT ILLNESS:  This is a 81-YO male seen for a routine visit. Eldertonic was recently started for appetite stimulant. Latest weight  180.0 lbs. He is currently having ST treatments for dysphagia. He was recently started on supplementations.    PAST MEDICAL HISTORY:  Past Medical History:  Diagnosis Date  . Alcohol abuse, in remission   . Atrial flutter (HCC)   . Back pain   . Cervicalgia   . Coronary artery disease   . Coronary atherosclerosis of unspecified type of vessel, native or graft   . Dementia   . Depression   . Diabetes mellitus without complication (HCC)   . Disturbance of skin sensation   . Hypertension   . MI, old   . Reflux   . Stroke (HCC)   . Unspecified hearing loss   . Unspecified sleep apnea      CURRENT MEDICATIONS: Reviewed  Patient's Medications  New Prescriptions   No medications on file  Previous Medications   ACETAMINOPHEN (TYLENOL) 325 MG TABLET    Take 650 mg by mouth every 4 (four) hours as needed for mild pain.   ALBUTEROL (PROVENTIL) (2.5  MG/3ML) 0.083% NEBULIZER SOLUTION    Take 3 mLs (2.5 mg total) by nebulization 2 (two) times daily as needed for wheezing or shortness of breath.   ALLOPURINOL (ZYLOPRIM) 300 MG TABLET    Take 300 mg by mouth daily. For gout   ATORVASTATIN (LIPITOR) 20 MG TABLET    Take 1 tablet (20 mg total) by mouth daily at 6 PM.   BISACODYL (DULCOLAX) 10 MG SUPPOSITORY    Place 10 mg rectally daily as needed for moderate constipation. x2 days   CARVEDILOL (COREG) 12.5 MG TABLET    Take 12.5 mg by mouth 2 (two) times daily with a meal.    DULOXETINE 40 MG CPEP    Take 40 mg by mouth daily.   GABAPENTIN (NEURONTIN) 250 MG/5ML SOLUTION    Take 300 mg by mouth daily.   GERIATRIC MULTIVITAMINS-MINERALS (ELDERTONIC/GEVRABON) ELIX    Take 15 mLs by mouth 2 (two) times daily.   HYDRALAZINE (APRESOLINE) 50 MG TABLET    Take 50 mg by mouth 4 (four) times daily.    LISINOPRIL (PRINIVIL,ZESTRIL) 20 MG TABLET    Take 1 tablet (20 mg total) by mouth 2 (two) times daily.   LORATADINE (CLARITIN) 10 MG TABLET    Take 10 mg  by mouth daily as needed for allergies.   METFORMIN (GLUCOPHAGE) 500 MG TABLET    Take 500 mg by mouth daily. 6AM   NITROGLYCERIN (NITROSTAT) 0.4 MG SL TABLET    Place 0.4 mg under the tongue every 5 (five) minutes as needed. Chest pain   NUTRITIONAL SUPPLEMENT LIQD    Take 120 mLs by mouth 2 (two) times daily. NSA MedPass between breakfast and lunch and lunch and dinner   NUTRITIONAL SUPPLEMENTS (NUTRITIONAL SUPPLEMENT PO)    Take 1 each by mouth daily. Magic Cup at [employer]   QUETIAPINE (SEROQUEL) 25 MG TABLET    Take 1 tablet (25 mg total) by mouth at bedtime.   SENNA-DOCUSATE (SENOKOT-S) 8.6-50 MG TABLET    Take 1 tablet by mouth 2 (two) times daily.   TAMSULOSIN (FLOMAX) 0.4 MG CAPS CAPSULE    Take 0.4 mg by mouth daily.   Modified Medications   No medications on file  Discontinued Medications   No medications on file     No Known Allergies   REVIEW OF SYSTEMS:  GENERAL: no change in appetite, no  fatigue, no weight changes, no fever, chills or weakness EYES: Denies change in vision, dry eyes, eye pain, itching or discharge EARS: Denies change in hearing, ringing in ears, or earache NOSE: Denies nasal congestion or epistaxis MOUTH and THROAT: Denies oral discomfort, gingival pain or bleeding, pain from teeth or hoarseness   RESPIRATORY: no cough, SOB, DOE, wheezing, hemoptysis CARDIAC: no chest pain, edema or palpitations GI: no abdominal pain, diarrhea, constipation, heart burn, nausea or vomiting GU: Denies dysuria, frequency, hematuria, incontinence, or discharge PSYCHIATRIC: Denies feeling of depression or anxiety. No report of hallucinations, insomnia, paranoia, or agitation   PHYSICAL EXAMINATION  GENERAL APPEARANCE: Well nourished. In no acute distress. Obese SKIN:  Skin is warm and dry.  HEAD: Normal in size and contour. No evidence of trauma EYES: Lids open and close normally. No blepharitis, entropion or ectropion. PERRL. Conjunctivae are clear and sclerae are white. Lenses are without opacity EARS: Pinnae are normal. Hard of hearing MOUTH and THROAT: Lips are without lesions. Oral mucosa is moist and without lesions. Tongue is normal in shape, size, and color and without lesions NECK: supple, trachea midline, no neck masses, no thyroid tenderness, no thyromegaly LYMPHATICS: no LAN in the neck, no supraclavicular LAN RESPIRATORY: breathing is even & unlabored, BS CTAB CARDIAC: RRR, no murmur,no extra heart sounds, no edema GI: abdomen soft, normal BS, no masses, no tenderness, no hepatomegaly, no splenomegaly EXTREMITIES:  Able to move X 4 extremities PSYCHIATRIC: Alert to person, disoriented to time and place. Affect and behavior are appropriate    LABS/RADIOLOGY: Labs reviewed: Basic Metabolic Panel:  Recent Labs  16/10/96 0844 07/04/16 0610 07/05/16 0633 07/19/16 09/06/16  NA 140 141 143 138 145  K 3.9 4.2 3.9 4.9 3.7  CL 104 105 107  --   --   CO2 25 26  29   --   --   GLUCOSE 109* 127* 139*  --   --   BUN 22* 24* 23* 34* 8  CREATININE 0.89 0.95 0.93 0.9 0.8  CALCIUM 9.0 8.8* 8.9  --   --    Liver Function Tests:  Recent Labs  01/09/16 1033 06/24/16 1352 07/19/16  AST 18 19 54*  ALT 15* 17  --   ALKPHOS 54 64 86  BILITOT 0.8 0.7  --   PROT 7.1 8.1  --   ALBUMIN 3.8 4.2  --  CBC:  Recent Labs  06/24/16 1352  07/03/16 0844 07/04/16 0610 07/05/16 0633 07/19/16 07/27/16 09/06/16  WBC 13.1*  < > 12.0* 6.4 11.6* 15.0 14.1 7.2  NEUTROABS 10.6*  --   --   --   --  12 11  --   HGB 13.9  < > 12.5* 12.0* 11.6* 12.5* 11.8* 12.5*  HCT 42.4  < > 39.1 33.4* 36.6* 39* 37* 39*  MCV 90.2  < > 92.0 90.5 90.8  --   --   --   PLT 202  < > 180 393 203 253 182 235  < > = values in this interval not displayed. Lipid Panel:  Recent Labs  06/28/16 0347  HDL 35*   CBG:  Recent Labs  07/06/16 0758 07/06/16 1218 07/06/16 1625  GLUCAP 136* 104* 120*      ASSESSMENT/PLAN:  BPH - continue Flomax 0.4 mg 1 capsule by mouth daily  Chronic systolic CHF - no SOB; continue Coreg 12.5 mg 1 tab by mouth daily, hydralazine 50 mg 1 tab by mouth 4 times a day and lisinopril 20 mg 1 tab by mouth twice a day  Hyperlipidemia - Lipitor 20 mg 1 tab by mouth daily at bedtime  Constipation - continue senna S 1 tab by mouth twice a day   Dysphagia - currently having ST treatments; aspiration precautions     Goals of care:  Long-term care     Shyla Gayheart C. Medina-Vargas - NP [facility] [phone]

## 2016-11-11 DIAGNOSIS — R1312 Dysphagia, oropharyngeal phase: Secondary | ICD-10-CM | POA: Diagnosis not present

## 2016-11-12 DIAGNOSIS — R1312 Dysphagia, oropharyngeal phase: Secondary | ICD-10-CM | POA: Diagnosis not present

## 2016-11-15 DIAGNOSIS — R1312 Dysphagia, oropharyngeal phase: Secondary | ICD-10-CM | POA: Diagnosis not present

## 2016-11-16 DIAGNOSIS — R1312 Dysphagia, oropharyngeal phase: Secondary | ICD-10-CM | POA: Diagnosis not present

## 2016-11-17 DIAGNOSIS — R1312 Dysphagia, oropharyngeal phase: Secondary | ICD-10-CM | POA: Diagnosis not present

## 2016-11-18 DIAGNOSIS — R1312 Dysphagia, oropharyngeal phase: Secondary | ICD-10-CM | POA: Diagnosis not present

## 2016-11-19 DIAGNOSIS — R1312 Dysphagia, oropharyngeal phase: Secondary | ICD-10-CM | POA: Diagnosis not present

## 2016-11-24 ENCOUNTER — Encounter: Payer: Self-pay | Admitting: Neurology

## 2016-11-24 ENCOUNTER — Ambulatory Visit (INDEPENDENT_AMBULATORY_CARE_PROVIDER_SITE_OTHER): Payer: Medicare Other | Admitting: Neurology

## 2016-11-24 ENCOUNTER — Ambulatory Visit: Payer: Medicare Other | Admitting: Neurology

## 2016-11-24 VITALS — BP 178/80 | HR 70

## 2016-11-24 DIAGNOSIS — I10 Essential (primary) hypertension: Secondary | ICD-10-CM

## 2016-11-24 DIAGNOSIS — I680 Cerebral amyloid angiopathy: Secondary | ICD-10-CM | POA: Diagnosis not present

## 2016-11-24 DIAGNOSIS — E785 Hyperlipidemia, unspecified: Secondary | ICD-10-CM

## 2016-11-24 DIAGNOSIS — I611 Nontraumatic intracerebral hemorrhage in hemisphere, cortical: Secondary | ICD-10-CM | POA: Diagnosis not present

## 2016-11-24 DIAGNOSIS — R4189 Other symptoms and signs involving cognitive functions and awareness: Secondary | ICD-10-CM

## 2016-11-24 DIAGNOSIS — F01518 Vascular dementia, unspecified severity, with other behavioral disturbance: Secondary | ICD-10-CM

## 2016-11-24 DIAGNOSIS — F0151 Vascular dementia with behavioral disturbance: Secondary | ICD-10-CM | POA: Diagnosis not present

## 2016-11-24 NOTE — Patient Instructions (Addendum)
-   continue lipitor for stroke prevention - avoid antiplatelet due to recurrent brain bleeding - BP still high and not controlled. Continue coreg and lisinopril as they are, but recommend to increase hydralazine to 75mg  4 times a day. - [facility] and record and bring over to doctor in [facility] for medication adjustment - no driving due to dementia and vision changes - Follow up with your primary care physician for stroke risk factor modification. Recommend maintain blood pressure goal <130/80, diabetes with hemoglobin A1c goal below 6.5% and lipids with LDL cholesterol goal below 70 mg/dL.  - continue seroquel for behavior disturbance.  - follow up in 6 months with me.

## 2016-11-24 NOTE — Progress Notes (Signed)
STROKE NEUROLOGY FOLLOW UP NOTE  NAME: Austin Sawyer DOB: Mar 02, 1935  REASON FOR VISIT: stroke follow up HISTORY FROM: chart and pt and wife  Today we had the pleasure of seeing Austin Sawyer in follow-up at our Neurology Clinic. Pt was accompanied by wife.   History Summary Austin Sawyer is a 82 y.o. male with history of HTN, DM, CAD, atrial flutter on Coumadin before until 2011, alcohol abuse admitted on 02/08/15 for headache, nausea vomiting, confusion, blurred vision. CT had showed left occipital ICH with small SAH, SDH and IVH. Put on 3% saline and admitted to ICU. He was then stabilized and off 3% saline. Stroke work up including MRA, CUS, 2D ehco, and EEG were negative. Repeat CT head stable. LDL 107. He was discharged to SNF after medically ready.   Follow up 05/14/15 - the patient has been doing well. He stayed in SNF until one month ago. He is currently at home with home PT/OT/speech 3 times per week. He still has right hemoanopia and will follow up with ophthalmology tomorrow. He also follows up with PCP next month. BP today 137/77. He is on 4 BP meds now.     Follow up 08/18/15 - pt has been doing well. Glucose at home is OK, around 80s. BP today in clinic 125/75. Wife stated that pt seems to have significant cognitive impairment after stroke. Can not read, write, or doing math as per wife after stroke. Pt also repeat his questions during clinic visit. His right hemianopia seems somewhat better, he will see ophthal tomorrow for follow up. BP 125/75 today.  Follow up 11/2015 - pt has been doing the same. Had ophthalmology follow up and had new prescription glasses. No significant visual field changes, still has right hemianopia. Wife still reports cognitive impairment.  05/24/16 follow up - Pt has been doing well. Seems right hemianopia improved, able to see shadow of hands, but still blurry. Lost glasses that ophthalmology prescribed to him. As per wife, his memory is stable but still has a  lot of mood swings. BP 120/75.  06/24/16 admission - pt admitted to [facility] for confusion and behavior changes for one week. CT head showed right frontal ICH with midline shift in setting of hypertension due to noncompliance or CAA given recurrent lobar ICHs. EEG no seizure, EF 60-65%. BP controlled with IV and then po meds. Due to concern of CAA, no antiplatelet was recommended on discharge. He was sent to SNF with lipitor and seroquel. Had PEG placement for dysphagia. Gout was treated with colchicine and allopurinol.   Interval History During the interval time, pt has been doing well. He is more awake alert with fluent language today in clinic. However, due to baseline dementia, her speech frequently off topic with perseveration. Came in wheelchair but able to walk in NH. Currently, wife is dealing with daughter/son for pt guardianship. She has court appearance next Monday. His PEG tube was removed in 09/2016. BP still high 178/80 today and no paperwork from SNF for BP monitoring except BP 160/85 on 08/28/16. He is on 3 BP meds currently in SNF.   REVIEW OF SYSTEMS: Full 14 system review of systems performed and notable only for those listed below and in HPI above, all others are negative:  Constitutional:   Cardiovascular:  Ear/Nose/Throat:   Skin:  Eyes:   Respiratory:   Gastroitestinal:   Genitourinary:  Hematology/Lymphatic:   Endocrine:  Musculoskeletal: joint pain, walking difficulty Allergy/Immunology:   Neurological: memory loss Psychiatric:  Sleep:    The following represents the patient's updated allergies and side effects list: No Known Allergies  The neurologically relevant items on the patient's problem list were reviewed on today's visit.  Neurologic Examination  A problem focused neurological exam (12 or more points of the single system neurologic examination, vital signs counts as 1 point, cranial nerves count for 8 points) was performed.  Blood pressure (!) 178/80,  pulse 70.  General - Well nourished, well developed, in no apparent distress, pleasant.  Ophthalmologic - Fundi not visualized due to noncooperation.  Cardiovascular - Regular rate and rhythm.  Mental Status -  Awake alert, orientated to place and people, but not time or situation.  Language exam showed fluent speech, but frequent off topic with perseveration. Able to follow some simple commands, but still has difficulty with complete comprehension. Naming 2 out of 5 with perseveration and not able to repeat not meaningful sentences. Concentration and attention impaired, not able to perform backward spelling or calculation. Fund of knowledge severely impaired. Delayed recall 0/3.   Cranial Nerves II - XII - II - not cooperative on exam, but able to blink bilaterally to visual threat III, IV, VI - eye movement intact. V - Facial sensation intact bilaterally. VII - Facial movement intact bilaterally. VIII - Hearing & vestibular intact bilaterally. X - Palate elevates symmetrically. XI - Chin turning & shoulder shrug intact bilaterally. XII - Tongue protrusion intact.  Motor Strength - The patient's strength was normal in all extremities and pronator drift was absent.  Bulk was normal and fasciculations were absent.   Motor Tone - Muscle tone was assessed at the neck and appendages and was normal.  Reflexes - The patient's reflexes were 1+ in all extremities and he had no pathological reflexes.  Sensory - Light touch, temperature/pinprick were assessed and were normal.    Coordination - The patient had normal movements in the hands and feet with no ataxia or dysmetria.  Tremor was absent.  Gait and Station - not tested, in wheelchair.  Data reviewed: I personally reviewed the images and agree with the radiology interpretations.  Ct Head Wo Contrast 02/16/2015 - Stable subarachnoid hemorrhage seen overlying right parietal cortex. Stable large left occipital intraparenchymal  hemorrhage is noted. with surrounding white matter edema resulting in approximately 6 mL. of left to right midline shift. Stable left-sided subdural hematoma is noted. Stable bilateral lateral intraventricular hemorrhage is noted without ventricular dilatation.  02/10/2015 - 1. No significant interval change in size of left occipital lobe intraparenchymal hematoma with slightly increased associated vasogenic edema as compared to previous. Similar trace left-to-right shift. 2. Stable to slightly decreased conspicuity of bilateral small volume subarachnoid hemorrhage. 3. Stable intraventricular hemorrhage. No hydrocephalus. 4. Persistent left subdural hematoma measuring up to 3.5 mm in maximal diameter. Blood along the left tentorium is decreased in conspicuity. 5. No new intracranial abnormality.  02/09/2015 IMPRESSION: 1. Slight enlargement of large left occipital lobe intra-axial hemorrhage. Estimated hemorrhage volume now 58 mm. Stable left posterior hemisphere edema and mass effect. 2. Mild progression of subarachnoid extension, now bilateral. Extension and of a left subdural space with small broad-based left subdural hematoma not significantly changed. Stable intraventricular hemorrhage volume. No ventriculomegaly. 3. No new intracranial abnormality identified.   02/08/2015 IMPRESSION: 1. 6.7 cm left occipital parenchymal hemorrhage with mild surrounding edema. No midline shift. 2. Small amount of adjacent subarachnoid hemorrhage and small amount of intraventricular extension of blood. 3. Small subdural hematomas over the left cerebral convexity and  left tentorium.   Dg Chest Port 1 View 02/13/15 1. Stable support apparatus. 2. Improved basilar atelectasis. 02/10/15 Tube and catheter positions as described without pneumothorax. No edema or consolidation. 02/09/2015 IMPRESSION: No acute cardiopulmonary disease.   Carotid Doppler No evidence of significant stenosis in the right or left  ICA. The left side was technically challenged due to line placement.   2D Echocardiogram Left ventricle: The cavity size was normal. Wall thickness was increased in a pattern of moderate LVH. Systolic function was normal. The estimated ejection fraction was in the range of 55% to 60%. Doppler parameters are consistent with elevated ventricular end-diastolic filling pressure. - Aortic valve: Poorly seen Moderately calcified mild stenosis by gradients but may be underestimated due to poor CW angle. Valve area (VTI): 2.11 cm^2. Valve area (Vmax): 2.01 cm^2. Valve area (Vmean): 1.88 cm^2. - Mitral valve: There was mild regurgitation. - Left atrium: The atrium was moderately dilated. - Impressions: Cannot r/o SOE due to extremely poor image quality. Impressions: - Cannot r/o SOE due to extremely poor image quality.  MRI and MRA  7.3 x 3.3 cm left occipital hematoma unchanged from the prior CT. There is intraventricular hemorrhage and subarachnoid hemorrhage also unchanged. There is mass-effect on the left occipital horn and 3 mm midline shift to the right. No evidence of underlying tumor or vascular malformation. This may be related to cerebral amyloid angiography. Hypertension also possible. Negative MRA circle Willis.  EEG This EEG is abnormal with moderately severe generalized continuous nonspecific slowing of cerebral activity. No evidence of epileptic activity was recorded. The absence of epileptiform activity during an EEG recording does not, in and of itself, rule out seizure disorder, however.  EKG NSR  Component     Latest Ref Rng 02/09/2015  Cholesterol     0 - 200 mg/dL 409  Triglycerides     <150 mg/dL 47  HDL Cholesterol     >40 mg/dL 62  Total CHOL/HDL Ratio      2.9  VLDL     0 - 40 mg/dL 9  LDL (calc)     0 - 99 mg/dL 811 (H)  Hemoglobin B1Y     4.8 - 5.6 % 6.0 (H)  Mean Plasma Glucose      126  TSH     0.350 - 4.500 uIU/mL 1.154    07/19/16 A1C 6.4, AST 54 and ALT 113  Assessment: As you may recall, he is a 83 y.o. African American male with PMH of HTN, DM, CAD, atrial flutter on Coumadin before until 2010, alcohol abuse admitted on 02/08/15 for left occipital ICH with SAH, SDH and IVH. Recovered well with no need of surgical intervention. Stroke work up largely negative except LDL 107. Etiology still most likely HTN but CAA not able to completely ruled out. Pt later was sent to SNF and then home. Still has significant language deficit and hemianopia. BP stable on 4 BP meds. Repeat CT showed resolution of bleed and will put on ASA 81mg . Has hx of afib on coumadin but in 2011 Dr. Antoine Poche considered his Aflutter was transient and cured, and he was then off coumadin. Pt still has significant language and cognitive impairment, and MMSE 6/30. More consistent with vascular dementia. In 06/24/16, he was admitted for right frontal ICH in setting of hypertension due to noncompliance or CAA given recurrent lobar ICHs. EEG no seizure, EF 60-65%. BP controlled with IV and then po meds. Due to concern of CAA, no antiplatelet was recommended  on discharge. He was sent to SNF with lipitor and seroquel. Currently, pt in SNF, PEG removed, continue to have severe cognitive impairment.   Plan:  - continue lipitor for stroke prevention - close monitor LFTs, if still high, consider to d/c lipitor - avoid antiplatelet due to concerns of CAA - BP still high and not controlled. Continue coreg and lisinopril as they are, but recommend to increase hydralazine to 75mg  4 times a day. - [facility] and record and bring over to doctor in [facility] for medication adjustment - no driving due to dementia and vision changes - Follow up with your primary care physician for stroke risk factor modification. Recommend maintain blood pressure goal <130/80, diabetes with hemoglobin A1c goal below 6.5% and lipids with LDL cholesterol goal  below 70 mg/dL.  - continue seroquel for behavior disturbance.  - follow up in 6 months with me.  I spent more than 25 minutes of face to face time with the patient. Greater than 50% of time was spent in counseling and coordination of care. We have discussed about no antiplatelet therapy, BP monitoring, and dementia management.    No orders of the defined types were placed in this encounter.   No orders of the defined types were placed in this encounter.   Patient Instructions  - continue lipitor for stroke prevention - avoid antiplatelet due to recurrent brain bleeding - BP still high and not controlled. Continue coreg and lisinopril as they are, but recommend to increase hydralazine to 75mg  4 times a day. - [facility] record and bring over to doctor in [place] [facility] for medication adjustment - no driving due to dementia and vision changes - Follow up with your primary care physician for stroke risk factor modification. Recommend maintain blood pressure goal <130/80, diabetes with hemoglobin A1c goal below 6.5% and lipids with LDL cholesterol goal below 70 mg/dL.  - continue seroquel for behavior disturbance.  - follow up in 6 months with me.   Marvel PlanJindong Marsh Heckler, MD PhD [facility] [address], [address] [place], [place] [address] [phone]

## 2016-12-08 ENCOUNTER — Encounter: Payer: Self-pay | Admitting: Adult Health

## 2016-12-08 ENCOUNTER — Non-Acute Institutional Stay (SKILLED_NURSING_FACILITY): Payer: Medicare Other | Admitting: Adult Health

## 2016-12-08 DIAGNOSIS — J309 Allergic rhinitis, unspecified: Secondary | ICD-10-CM

## 2016-12-08 DIAGNOSIS — M109 Gout, unspecified: Secondary | ICD-10-CM | POA: Diagnosis not present

## 2016-12-08 DIAGNOSIS — E1142 Type 2 diabetes mellitus with diabetic polyneuropathy: Secondary | ICD-10-CM | POA: Diagnosis not present

## 2016-12-08 DIAGNOSIS — I10 Essential (primary) hypertension: Secondary | ICD-10-CM | POA: Diagnosis not present

## 2016-12-08 DIAGNOSIS — I611 Nontraumatic intracerebral hemorrhage in hemisphere, cortical: Secondary | ICD-10-CM

## 2016-12-08 NOTE — Progress Notes (Signed)
Patient ID: Austin Sawyer, male   DOB: 1934-12-18, 82 y.o.   MRN: [id number]    DATE:  12/08/2016   MRN:  [id number]  BIRTHDAY: 1935-06-22  Facility:  Nursing Home Location:  [facility]  Nursing Home Room Number: 306-B  endorseLEVEL OF CARE:  SNF (31)  Contact Information    Name Relation Home Work Mobile   [place] Spouse [phone]     [place], [place] [phone]     [place] Ebbs Daughter [phone]         Code Status History    Date Active Date Inactive Code Status Order ID Comments User Context   06/24/2016 11:08 PM 07/07/2016  8:40 AM Full Code [id number]  Ulice Dash, PA-C Inpatient   02/08/2015 10:11 PM 02/21/2015 11:59 PM Full Code [id number]  Lunette Stands, MD Inpatient   04/22/2013  4:29 PM 04/23/2013 11:53 AM Full Code [id number]  Gilda Crease, MD ED       Chief Complaint  Patient presents with  . Medical Management of Chronic Issues    HISTORY OF PRESENT ILLNESS:  This is a 82-YO male seen for a routine visit. Hydralazine dosage was recently increased from 50 mg QID to 75 mg QID. Latest BP 135/66 . Latest hgbA1c 6.4. He was seen today in the dining room and was noted to be pleasantly confused.   PAST MEDICAL HISTORY:  Past Medical History:  Diagnosis Date  . Alcohol abuse, in remission   . Atrial flutter (HCC)   . Back pain   . Cervicalgia   . Coronary artery disease   . Coronary atherosclerosis of unspecified type of vessel, native or graft   . Dementia   . Depression   . Diabetes mellitus without complication (HCC)   . Disturbance of skin sensation   . Hypertension   . MI, old   . Reflux   . Stroke (HCC)   . Unspecified hearing loss   . Unspecified sleep apnea      CURRENT MEDICATIONS: Reviewed  Patient's Medications  New Prescriptions   No medications on file  Previous Medications   ACETAMINOPHEN (TYLENOL) 325 MG TABLET    Take 650 mg by mouth every 4 (four) hours as needed for mild pain.   ALBUTEROL  (PROVENTIL) (2.5 MG/3ML) 0.083% NEBULIZER SOLUTION    Take 3 mLs (2.5 mg total) by nebulization 2 (two) times daily as needed for wheezing or shortness of breath.   ALLOPURINOL (ZYLOPRIM) 300 MG TABLET    Take 300 mg by mouth daily. For gout   ATORVASTATIN (LIPITOR) 20 MG TABLET    Take 1 tablet (20 mg total) by mouth daily at 6 PM.   BISACODYL (DULCOLAX) 10 MG SUPPOSITORY    Place 10 mg rectally daily as needed for moderate constipation. x2 days   CARVEDILOL (COREG) 12.5 MG TABLET    Take 12.5 mg by mouth 2 (two) times daily with a meal.    DULOXETINE 40 MG CPEP    Take 40 mg by mouth daily.   GABAPENTIN (NEURONTIN) 250 MG/5ML SOLUTION    Take 300 mg by mouth daily.   GERIATRIC MULTIVITAMINS-MINERALS (ELDERTONIC/GEVRABON) ELIX    Take 15 mLs by mouth 2 (two) times daily.   HYDRALAZINE (APRESOLINE) 50 MG TABLET    Take 50 mg by mouth 4 (four) times daily.    LISINOPRIL (PRINIVIL,ZESTRIL) 20 MG TABLET    Take 1 tablet (20 mg total) by mouth 2 (two) times daily.   LORATADINE (CLARITIN) 10  MG TABLET    Take 10 mg by mouth daily as needed for allergies.   METFORMIN (GLUCOPHAGE) 500 MG TABLET    Take 500 mg by mouth daily.    NITROGLYCERIN (NITROSTAT) 0.4 MG SL TABLET    Place 0.4 mg under the tongue every 5 (five) minutes as needed. Chest pain   NUTRITIONAL SUPPLEMENT LIQD    Take 120 mLs by mouth 2 (two) times daily. NSA MedPass between breakfast and lunch and lunch and dinner   NUTRITIONAL SUPPLEMENTS (NUTRITIONAL SUPPLEMENT PO)    Take 1 each by mouth daily. Magic Cup at [employer]   QUETIAPINE (SEROQUEL) 25 MG TABLET    Take 1 tablet (25 mg total) by mouth at bedtime.   SENNA-DOCUSATE (SENOKOT-S) 8.6-50 MG TABLET    Take 1 tablet by mouth 2 (two) times daily.   TAMSULOSIN (FLOMAX) 0.4 MG CAPS CAPSULE    Take 0.4 mg by mouth daily.   Modified Medications   No medications on file  Discontinued Medications   No medications on file     No Known Allergies   REVIEW OF SYSTEMS:  GENERAL: no change in  appetite, no fatigue, no weight changes, no fever, chills or weakness EYES: Denies change in vision, dry eyes, eye pain, itching or discharge EARS: Denies change in hearing, ringing in ears, or earache NOSE: Denies nasal congestion or epistaxis MOUTH and THROAT: Denies oral discomfort, gingival pain or bleeding, pain from teeth or hoarseness   RESPIRATORY: no cough, SOB, DOE, wheezing, hemoptysis CARDIAC: no chest pain, edema or palpitations GI: no abdominal pain, diarrhea, constipation, heart burn, nausea or vomiting GU: Denies dysuria, frequency, hematuria, incontinence, or discharge PSYCHIATRIC: Denies feeling of depression or anxiety. No report of hallucinations, insomnia, paranoia, or agitation   PHYSICAL EXAMINATION  GENERAL APPEARANCE: Well nourished. In no acute distress. Obese SKIN:  Skin is warm and dry.  HEAD: Normal in size and contour. No evidence of trauma EYES: Lids open and close normally. No blepharitis, entropion or ectropion. PERRL. Conjunctivae are clear and sclerae are white. Lenses are without opacity EARS: Pinnae are normal. Hard of hearing MOUTH and THROAT: Lips are without lesions. Oral mucosa is moist and without lesions. Tongue is normal in shape, size, and color and without lesions NECK: supple, trachea midline, no neck masses, no thyroid tenderness, no thyromegaly LYMPHATICS: no LAN in the neck, no supraclavicular LAN RESPIRATORY: breathing is even & unlabored, BS CTAB CARDIAC: RRR, no murmur,no extra heart sounds, no edema GI: abdomen soft, normal BS, no masses, no tenderness, no hepatomegaly, no splenomegaly EXTREMITIES:  Able to move X 4 extremities PSYCHIATRIC: Alert to person, disoriented to time and place. Affect and behavior are appropriate    LABS/RADIOLOGY: Labs reviewed: Basic Metabolic Panel:  Recent Labs  40/98/11 0844 07/04/16 0610 07/05/16 0633 07/19/16 09/06/16  NA 140 141 143 138 145  K 3.9 4.2 3.9 4.9 3.7  CL 104 105 107  --   --    CO2 25 26 29   --   --   GLUCOSE 109* 127* 139*  --   --   BUN 22* 24* 23* 34* 8  CREATININE 0.89 0.95 0.93 0.9 0.8  CALCIUM 9.0 8.8* 8.9  --   --    Liver Function Tests:  Recent Labs  01/09/16 1033 06/24/16 1352 07/19/16  AST 18 19 54*  ALT 15* 17  --   ALKPHOS 54 64 86  BILITOT 0.8 0.7  --   PROT 7.1 8.1  --  ALBUMIN 3.8 4.2  --     CBC:  Recent Labs  06/24/16 1352  07/03/16 0844 07/04/16 0610 07/05/16 0633 07/19/16 07/27/16 09/06/16  WBC 13.1*  < > 12.0* 6.4 11.6* 15.0 14.1 7.2  NEUTROABS 10.6*  --   --   --   --  12 11  --   HGB 13.9  < > 12.5* 12.0* 11.6* 12.5* 11.8* 12.5*  HCT 42.4  < > 39.1 33.4* 36.6* 39* 37* 39*  MCV 90.2  < > 92.0 90.5 90.8  --   --   --   PLT 202  < > 180 393 203 253 182 235  < > = values in this interval not displayed. Lipid Panel:  Recent Labs  06/28/16 0347  HDL 35*   CBG:  Recent Labs  07/06/16 0758 07/06/16 1218 07/06/16 1625  GLUCAP 136* 104* 120*      ASSESSMENT/PLAN:  Recurrent ICH - was recently seen by neurology on 2/21; continue Lipitor and avoidance of antiplatelet  Gout - continue allopurinol 300 mg 1 tab by mouth daily  Hypertension - continue lisinopril 20 mg 1 tab by mouth twice a day and hydralazine 50 mg 1 1/2 tab = 75 mg QID    Diabetes mellitus, type II - continue Glucophage 500 mg 1 tab by mouth daily  Lab Results  Component Value Date   HGBA1C 6.4 07/19/2016   Allergic rhinitis - continue Claritin 10 mg 1 tab by mouth daily when necessary     Goals of care:  Long-term care     Lida Berkery C. Medina-Vargas - NP [facility] [phone]

## 2016-12-13 DIAGNOSIS — M6281 Muscle weakness (generalized): Secondary | ICD-10-CM | POA: Diagnosis not present

## 2016-12-14 DIAGNOSIS — M6281 Muscle weakness (generalized): Secondary | ICD-10-CM | POA: Diagnosis not present

## 2016-12-15 DIAGNOSIS — M6281 Muscle weakness (generalized): Secondary | ICD-10-CM | POA: Diagnosis not present

## 2016-12-16 DIAGNOSIS — M6281 Muscle weakness (generalized): Secondary | ICD-10-CM | POA: Diagnosis not present

## 2016-12-17 DIAGNOSIS — M6281 Muscle weakness (generalized): Secondary | ICD-10-CM | POA: Diagnosis not present

## 2016-12-20 DIAGNOSIS — M6281 Muscle weakness (generalized): Secondary | ICD-10-CM | POA: Diagnosis not present

## 2016-12-21 ENCOUNTER — Encounter: Payer: Self-pay | Admitting: Adult Health

## 2016-12-21 ENCOUNTER — Non-Acute Institutional Stay (SKILLED_NURSING_FACILITY): Payer: Medicare Other | Admitting: Adult Health

## 2016-12-21 DIAGNOSIS — E1142 Type 2 diabetes mellitus with diabetic polyneuropathy: Secondary | ICD-10-CM

## 2016-12-21 DIAGNOSIS — J309 Allergic rhinitis, unspecified: Secondary | ICD-10-CM | POA: Diagnosis not present

## 2016-12-21 DIAGNOSIS — I611 Nontraumatic intracerebral hemorrhage in hemisphere, cortical: Secondary | ICD-10-CM | POA: Diagnosis not present

## 2016-12-21 DIAGNOSIS — M6281 Muscle weakness (generalized): Secondary | ICD-10-CM | POA: Diagnosis not present

## 2016-12-21 DIAGNOSIS — E785 Hyperlipidemia, unspecified: Secondary | ICD-10-CM

## 2016-12-21 DIAGNOSIS — I5022 Chronic systolic (congestive) heart failure: Secondary | ICD-10-CM

## 2016-12-21 DIAGNOSIS — K5900 Constipation, unspecified: Secondary | ICD-10-CM | POA: Diagnosis not present

## 2016-12-21 DIAGNOSIS — I10 Essential (primary) hypertension: Secondary | ICD-10-CM | POA: Diagnosis not present

## 2016-12-21 DIAGNOSIS — N400 Enlarged prostate without lower urinary tract symptoms: Secondary | ICD-10-CM | POA: Diagnosis not present

## 2016-12-21 DIAGNOSIS — F332 Major depressive disorder, recurrent severe without psychotic features: Secondary | ICD-10-CM | POA: Diagnosis not present

## 2016-12-21 DIAGNOSIS — G629 Polyneuropathy, unspecified: Secondary | ICD-10-CM | POA: Diagnosis not present

## 2016-12-21 DIAGNOSIS — F0151 Vascular dementia with behavioral disturbance: Secondary | ICD-10-CM | POA: Diagnosis not present

## 2016-12-21 DIAGNOSIS — M109 Gout, unspecified: Secondary | ICD-10-CM | POA: Diagnosis not present

## 2016-12-21 DIAGNOSIS — F01518 Vascular dementia, unspecified severity, with other behavioral disturbance: Secondary | ICD-10-CM

## 2016-12-21 NOTE — Progress Notes (Signed)
Patient ID: Austin Sawyer, male   DOB: Oct 27, 1934, 82 y.o.   MRN: [id number]    DATE:  12/21/2016   MRN:  [id number]  BIRTHDAY: [birth year]  Facility:  Nursing Home Location:  [facility]  Nursing Home Room Number: 306-B  endorseLEVEL OF CARE:  SNF (31)  Contact Information    Name Relation Home Work Mobile   [place] Spouse [phone]     Dyan, Creelman [phone]     Johnston Ebbs Daughter [phone]         Code Status History    Date Active Date Inactive Code Status Order ID Comments User Context   06/24/2016 11:08 PM 07/07/2016  8:40 AM Full Code [id number]  Ulice Dash, PA-C Inpatient   02/08/2015 10:11 PM 02/21/2015 11:59 PM Full Code [id number]  Lunette Stands, MD Inpatient   04/22/2013  4:29 PM 04/23/2013 11:53 AM Full Code [id number]  Gilda Crease, MD ED       Chief Complaint  Patient presents with  . Discharge Note    HISTORY OF PRESENT ILLNESS:  This is a 82-YO male who is for discharge home with Home health PT, OT, CNA and Nursing.  He has been admitted to [facility] on 07/06/17 from [facility] With admission date 06/24/16 through 07/06/16. He had right frontal intracranial hemorrhage with midline shift. It was thought to be from uncontrolled hypertension. He had dysphagia and PEG tube placement. However, the G-tube has now been discontinued and is able to eat orally. He has extended his stay @ [place] after short-term rehabilitation.  Patient was admitted to this facility for short-term rehabilitation after the patient's recent hospitalization.  Patient has completed SNF rehabilitation and therapy has cleared the patient for discharge.    PAST MEDICAL HISTORY:  Past Medical History:  Diagnosis Date  . Alcohol abuse, in remission   . Atrial flutter (HCC)   . Back pain   . Cervicalgia   . Coronary artery disease   . Coronary atherosclerosis of unspecified type of vessel, native or graft   . Dementia   .  Depression   . Diabetes mellitus without complication (HCC)   . Disturbance of skin sensation   . Hypertension   . MI, old   . Reflux   . Stroke (HCC)   . Unspecified hearing loss   . Unspecified sleep apnea      CURRENT MEDICATIONS: Reviewed  Patient's Medications  New Prescriptions   No medications on file  Previous Medications   ACETAMINOPHEN (TYLENOL) 325 MG TABLET    Take 650 mg by mouth every 4 (four) hours as needed for mild pain.   ALBUTEROL (PROVENTIL) (2.5 MG/3ML) 0.083% NEBULIZER SOLUTION    Take 3 mLs (2.5 mg total) by nebulization 2 (two) times daily as needed for wheezing or shortness of breath.   ALLOPURINOL (ZYLOPRIM) 300 MG TABLET    Take 300 mg by mouth daily. For gout   ATORVASTATIN (LIPITOR) 20 MG TABLET    Take 1 tablet (20 mg total) by mouth daily at 6 PM.   BISACODYL (DULCOLAX) 10 MG SUPPOSITORY    Place 10 mg rectally daily as needed for moderate constipation. x2 days   CARVEDILOL (COREG) 12.5 MG TABLET    Take 12.5 mg by mouth 2 (two) times daily with a meal.    DULOXETINE 40 MG CPEP    Take 40 mg by mouth daily.   GABAPENTIN (NEURONTIN) 300 MG CAPSULE    Take 300 mg by  mouth daily.   GERIATRIC MULTIVITAMINS-MINERALS (ELDERTONIC/GEVRABON) ELIX    Take 15 mLs by mouth 2 (two) times daily.   HYDRALAZINE (APRESOLINE) 50 MG TABLET    Take 50 mg by mouth 4 (four) times daily.    LISINOPRIL (PRINIVIL,ZESTRIL) 20 MG TABLET    Take 1 tablet (20 mg total) by mouth 2 (two) times daily.   LORATADINE (CLARITIN) 10 MG TABLET    Take 10 mg by mouth daily as needed for allergies.   METFORMIN (GLUCOPHAGE) 500 MG TABLET    Take 500 mg by mouth daily.    NITROGLYCERIN (NITROSTAT) 0.4 MG SL TABLET    Place 0.4 mg under the tongue every 5 (five) minutes as needed. Chest pain   NUTRITIONAL SUPPLEMENT LIQD    Take 120 mLs by mouth 2 (two) times daily. NSA MedPass between breakfast and lunch and lunch and dinner   NUTRITIONAL SUPPLEMENTS (NUTRITIONAL SUPPLEMENT PO)    Take 1 each by  mouth daily. Magic Cup at [employer]9AM   QUETIAPINE (SEROQUEL) 25 MG TABLET    Take 1 tablet (25 mg total) by mouth at bedtime.   SENNA-DOCUSATE (SENOKOT-S) 8.6-50 MG TABLET    Take 1 tablet by mouth 2 (two) times daily.   TAMSULOSIN (FLOMAX) 0.4 MG CAPS CAPSULE    Take 0.4 mg by mouth daily.   Modified Medications   No medications on file  Discontinued Medications   GABAPENTIN (NEURONTIN) 250 MG/5ML SOLUTION    Take 300 mg by mouth daily.     No Known Allergies   REVIEW OF SYSTEMS:  GENERAL: no change in appetite, no fatigue, no weight changes, no fever, chills or weakness EYES: Denies change in vision, dry eyes, eye pain, itching or discharge EARS: Denies change in hearing, ringing in ears, or earache NOSE: Denies nasal congestion or epistaxis MOUTH and THROAT: Denies oral discomfort, gingival pain or bleeding, pain from teeth or hoarseness   RESPIRATORY: no cough, SOB, DOE, wheezing, hemoptysis CARDIAC: no chest pain, edema or palpitations GI: no abdominal pain, diarrhea, constipation, heart burn, nausea or vomiting GU: Denies dysuria, frequency, hematuria, incontinence, or discharge PSYCHIATRIC: Denies feeling of depression or anxiety. No report of hallucinations, insomnia, paranoia, or agitation   PHYSICAL EXAMINATION  GENERAL APPEARANCE: Well nourished. In no acute distress. Obese SKIN:  Skin is warm and dry.  HEAD: Normal in size and contour. No evidence of trauma EYES: Lids open and close normally. No blepharitis, entropion or ectropion. PERRL. Conjunctivae are clear and sclerae are white. Lenses are without opacity EARS: Pinnae are normal. Hard of hearing MOUTH and THROAT: Lips are without lesions. Oral mucosa is moist and without lesions. Tongue is normal in shape, size, and color and without lesions NECK: supple, trachea midline, no neck masses, no thyroid tenderness, no thyromegaly LYMPHATICS: no LAN in the neck, no supraclavicular LAN RESPIRATORY: breathing is even &  unlabored, BS CTAB CARDIAC: RRR, no murmur,no extra heart sounds, no edema GI: abdomen soft, normal BS, no masses, no tenderness, no hepatomegaly, no splenomegaly EXTREMITIES:  Able to move X 4 extremities PSYCHIATRIC: Alert to person, disoriented to time and place. Affect and behavior are appropriate    LABS/RADIOLOGY: Labs reviewed: Basic Metabolic Panel:  Recent Labs  40/98/1109/30/17 0844 07/04/16 0610 07/05/16 0633 07/19/16 09/06/16  NA 140 141 143 138 145  K 3.9 4.2 3.9 4.9 3.7  CL 104 105 107  --   --   CO2 25 26 29   --   --   GLUCOSE 109* 127*  139*  --   --   BUN 22* 24* 23* 34* 8  CREATININE 0.89 0.95 0.93 0.9 0.8  CALCIUM 9.0 8.8* 8.9  --   --    Liver Function Tests:  Recent Labs  01/09/16 1033 06/24/16 1352 07/19/16  AST 18 19 54*  ALT 15* 17  --   ALKPHOS 54 64 86  BILITOT 0.8 0.7  --   PROT 7.1 8.1  --   ALBUMIN 3.8 4.2  --     CBC:  Recent Labs  06/24/16 1352  07/03/16 0844 07/04/16 0610 07/05/16 0633 07/19/16 07/27/16 09/06/16  WBC 13.1*  < > 12.0* 6.4 11.6* 15.0 14.1 7.2  NEUTROABS 10.6*  --   --   --   --  12 11  --   HGB 13.9  < > 12.5* 12.0* 11.6* 12.5* 11.8* 12.5*  HCT 42.4  < > 39.1 33.4* 36.6* 39* 37* 39*  MCV 90.2  < > 92.0 90.5 90.8  --   --   --   PLT 202  < > 180 393 203 253 182 235  < > = values in this interval not displayed. Lipid Panel:  Recent Labs  06/28/16 0347  HDL 35*   CBG:  Recent Labs  07/06/16 0758 07/06/16 1218 07/06/16 1625  GLUCAP 136* 104* 120*      ASSESSMENT/PLAN:  Nontraumatic cortical hemorrhage of the right cerebral hemisphere -  continue Lipitor and avoidance of antiplatelet; follow-up with neurology  Hyperlipidemia - continue Lipitor 20 mg 1 tablet by mouth daily at bedtime   Depression - continue duloxetine 40 mg 1 capsule by mouth daily  BPH - continue Flomax 0.4 mg 1 capsule by mouth daily  Gout - continue allopurinol 300 mg 1 tab by mouth daily  Hypertension - continue lisinopril 20 mg  1 tab by mouth twice a day, Coreg 12.5 mg 1 tab by mouth twice a day and hydralazine 50 mg 1 1/2 tab = 75 mg QID   Constipation - continue senna S 1 tab by mouth twice a day   Diabetes mellitus, type II - continue Glucophage 500 mg 1 tab by mouth daily  Lab Results  Component Value Date   HGBA1C 6.4 07/19/2016   Allergic rhinitis - continue Claritin 10 mg 1 tab by mouth daily when necessary  Neuropathy - continue Neurontin 300 mg 1 capsule by mouth daily  Vascular dementia with behavior disturbance - continue Seroquel 25 mg 1 tab by mouth daily at bedtime  Chronic systolic CHF - continue Coreg 36.6 mg 1 tab by mouth twice a day, hydralazine 50 mg give 1 1/2 tab = 75 mg by mouth 4 times a day     I have filled out patient's discharge paperwork and written prescriptions.  Patient will receive home health PT, OT, Nursing and CNA.  DME provided:  Bedside commode and wheelchair  Total discharge time: Greater than 30 minutes Greater than 50% was spent in counseling and coordination of care with the patient.  Discharge time involved coordination of the discharge process with social worker, nursing staff and therapy department. Medical justification for home health services/DME verified.    Monina C. Medina-Vargas - NP [employer] [phone]

## 2016-12-22 DIAGNOSIS — M6281 Muscle weakness (generalized): Secondary | ICD-10-CM | POA: Diagnosis not present

## 2016-12-23 DIAGNOSIS — M6281 Muscle weakness (generalized): Secondary | ICD-10-CM | POA: Diagnosis not present

## 2016-12-24 DIAGNOSIS — M6281 Muscle weakness (generalized): Secondary | ICD-10-CM | POA: Diagnosis not present

## 2016-12-27 ENCOUNTER — Telehealth: Payer: Self-pay | Admitting: Neurology

## 2016-12-27 NOTE — Telephone Encounter (Signed)
Birder [facility]Keesa [facility]. She wants to discuss home health services and would not go into detail.

## 2016-12-27 NOTE — Telephone Encounter (Signed)
Rn call Austin Sawyer that pt was last seen 11/24/2016 by Dr. Roda ShuttersXu. [facility]. Austin Sawyer wanted to know pts PCP. RN stated [place] [facility] [place]. Keesa verbalized understanding.

## 2016-12-29 DIAGNOSIS — E1142 Type 2 diabetes mellitus with diabetic polyneuropathy: Secondary | ICD-10-CM | POA: Diagnosis not present

## 2016-12-29 DIAGNOSIS — I2510 Atherosclerotic heart disease of native coronary artery without angina pectoris: Secondary | ICD-10-CM | POA: Diagnosis not present

## 2016-12-29 DIAGNOSIS — F0390 Unspecified dementia without behavioral disturbance: Secondary | ICD-10-CM | POA: Diagnosis not present

## 2016-12-29 DIAGNOSIS — I5022 Chronic systolic (congestive) heart failure: Secondary | ICD-10-CM | POA: Diagnosis not present

## 2016-12-29 DIAGNOSIS — I69354 Hemiplegia and hemiparesis following cerebral infarction affecting left non-dominant side: Secondary | ICD-10-CM | POA: Diagnosis not present

## 2016-12-29 DIAGNOSIS — I110 Hypertensive heart disease with heart failure: Secondary | ICD-10-CM | POA: Diagnosis not present

## 2016-12-30 DIAGNOSIS — I5022 Chronic systolic (congestive) heart failure: Secondary | ICD-10-CM | POA: Diagnosis not present

## 2016-12-30 DIAGNOSIS — F0390 Unspecified dementia without behavioral disturbance: Secondary | ICD-10-CM | POA: Diagnosis not present

## 2016-12-30 DIAGNOSIS — E1142 Type 2 diabetes mellitus with diabetic polyneuropathy: Secondary | ICD-10-CM | POA: Diagnosis not present

## 2016-12-30 DIAGNOSIS — I110 Hypertensive heart disease with heart failure: Secondary | ICD-10-CM | POA: Diagnosis not present

## 2016-12-30 DIAGNOSIS — I69354 Hemiplegia and hemiparesis following cerebral infarction affecting left non-dominant side: Secondary | ICD-10-CM | POA: Diagnosis not present

## 2016-12-30 DIAGNOSIS — I2510 Atherosclerotic heart disease of native coronary artery without angina pectoris: Secondary | ICD-10-CM | POA: Diagnosis not present

## 2017-02-19 ENCOUNTER — Encounter (HOSPITAL_COMMUNITY): Payer: Self-pay | Admitting: *Deleted

## 2017-02-19 ENCOUNTER — Emergency Department (HOSPITAL_COMMUNITY): Payer: Medicare Other

## 2017-02-19 ENCOUNTER — Emergency Department (HOSPITAL_COMMUNITY)
Admission: EM | Admit: 2017-02-19 | Discharge: 2017-02-19 | Disposition: A | Discharge status: Home / Self Care | Payer: Medicare Other | Attending: Emergency Medicine | Admitting: Emergency Medicine

## 2017-02-19 DIAGNOSIS — I2510 Atherosclerotic heart disease of native coronary artery without angina pectoris: Secondary | ICD-10-CM | POA: Insufficient documentation

## 2017-02-19 DIAGNOSIS — R569 Unspecified convulsions: Secondary | ICD-10-CM | POA: Diagnosis not present

## 2017-02-19 DIAGNOSIS — I252 Old myocardial infarction: Secondary | ICD-10-CM | POA: Insufficient documentation

## 2017-02-19 DIAGNOSIS — E119 Type 2 diabetes mellitus without complications: Secondary | ICD-10-CM | POA: Diagnosis not present

## 2017-02-19 DIAGNOSIS — I110 Hypertensive heart disease with heart failure: Secondary | ICD-10-CM | POA: Diagnosis not present

## 2017-02-19 DIAGNOSIS — Z79899 Other long term (current) drug therapy: Secondary | ICD-10-CM | POA: Diagnosis not present

## 2017-02-19 DIAGNOSIS — I5022 Chronic systolic (congestive) heart failure: Secondary | ICD-10-CM | POA: Insufficient documentation

## 2017-02-19 LAB — URINALYSIS, COMPLETE (UACMP) WITH MICROSCOPIC
BILIRUBIN URINE: NEGATIVE
Glucose, UA: NEGATIVE mg/dL
HGB URINE DIPSTICK: NEGATIVE
KETONES UR: NEGATIVE mg/dL
LEUKOCYTES UA: NEGATIVE
NITRITE: NEGATIVE
Protein, ur: 100 mg/dL — AB
Specific Gravity, Urine: 1.018 (ref 1.005–1.030)
pH: 5 (ref 5.0–8.0)

## 2017-02-19 LAB — CBC
HCT: 40 % (ref 39.0–52.0)
HEMOGLOBIN: 12.9 g/dL — AB (ref 13.0–17.0)
MCH: 29.3 pg (ref 26.0–34.0)
MCHC: 32.3 g/dL (ref 30.0–36.0)
MCV: 90.7 fL (ref 78.0–100.0)
PLATELETS: 177 10*3/uL (ref 150–400)
RBC: 4.41 MIL/uL (ref 4.22–5.81)
RDW: 14.2 % (ref 11.5–15.5)
WBC: 9.1 10*3/uL (ref 4.0–10.5)

## 2017-02-19 LAB — COMPREHENSIVE METABOLIC PANEL
ALBUMIN: 3.9 g/dL (ref 3.5–5.0)
ALK PHOS: 47 U/L (ref 38–126)
ALT: 11 U/L — ABNORMAL LOW (ref 17–63)
ANION GAP: 11 (ref 5–15)
AST: 19 U/L (ref 15–41)
BILIRUBIN TOTAL: 0.5 mg/dL (ref 0.3–1.2)
BUN: 15 mg/dL (ref 6–20)
CALCIUM: 8.9 mg/dL (ref 8.9–10.3)
CO2: 20 mmol/L — ABNORMAL LOW (ref 22–32)
Chloride: 107 mmol/L (ref 101–111)
Creatinine, Ser: 1.07 mg/dL (ref 0.61–1.24)
GFR calc non Af Amer: >60 mL/min (ref >60)
Glucose, Bld: 83 mg/dL (ref 65–99)
POTASSIUM: 4 mmol/L (ref 3.5–5.1)
Sodium: 138 mmol/L (ref 135–145)
TOTAL PROTEIN: 6.7 g/dL (ref 6.5–8.1)

## 2017-02-19 LAB — DIFFERENTIAL
Basophils Absolute: 0.1 10*3/uL (ref 0.0–0.1)
Basophils Relative: 1 %
EOS ABS: 0.4 10*3/uL (ref 0.0–0.7)
Eosinophils Relative: 5 %
LYMPHS ABS: 1.4 10*3/uL (ref 0.7–4.0)
LYMPHS PCT: 15 %
MONO ABS: 0.7 10*3/uL (ref 0.1–1.0)
Monocytes Relative: 8 %
NEUTROS PCT: 71 %
Neutro Abs: 6.6 10*3/uL (ref 1.7–7.7)

## 2017-02-19 LAB — I-STAT CHEM 8, ED
BUN: 16 mg/dL (ref 6–20)
CHLORIDE: 105 mmol/L (ref 101–111)
CREATININE: 1 mg/dL (ref 0.61–1.24)
Calcium, Ion: 1.11 mmol/L — ABNORMAL LOW (ref 1.15–1.40)
GLUCOSE: 80 mg/dL (ref 65–99)
HCT: 40 % (ref 39.0–52.0)
Hemoglobin: 13.6 g/dL (ref 13.0–17.0)
POTASSIUM: 4 mmol/L (ref 3.5–5.1)
Sodium: 143 mmol/L (ref 135–145)
TCO2: 23 mmol/L (ref 0–100)

## 2017-02-19 LAB — RAPID URINE DRUG SCREEN, HOSP PERFORMED
Amphetamines: NOT DETECTED
Barbiturates: NOT DETECTED
Benzodiazepines: NOT DETECTED
COCAINE: NOT DETECTED
OPIATES: NOT DETECTED
TETRAHYDROCANNABINOL: NOT DETECTED

## 2017-02-19 LAB — PROTIME-INR
INR: 1.09
Prothrombin Time: 14.1 seconds (ref 11.4–15.2)

## 2017-02-19 LAB — ETHANOL

## 2017-02-19 LAB — APTT: aPTT: 27 seconds (ref 24–36)

## 2017-02-19 LAB — I-STAT TROPONIN, ED: TROPONIN I, POC: 0 ng/mL (ref 0.00–0.08)

## 2017-02-19 NOTE — Discharge Instructions (Signed)
No driving or operating heavy machinery until cleared by neurologist

## 2017-02-19 NOTE — ED Triage Notes (Signed)
Patient comes in per [facility] with seizures. No hx of seizures. Fm home. Patient had full body seizure for 2 mins. Hx CVAs, head bleeds. No fall. Neuro intact. Patient was postictal. Aox4. Ems vs 180/90 bp, 106 HR, cbg 92. ekg SR. No IV.

## 2017-02-19 NOTE — ED Provider Notes (Signed)
[facility]-EMERGENCY DEPT Provider Note   CSN: [id number] Arrival date & time: 02/19/17  1354     History   Chief Complaint Chief Complaint  Patient presents with  . Seizures    HPI Austin Sawyer is a 82 y.o. male.  Patient presents for evaluation of seizure.  He is status post intracranial hemorrhage, 2, and has dementia.  He is unable to give any history.  According to EMS who brought him from his home, he had a two-minute seizure, but there was no described postictal state.   HPI  Past Medical History:  Diagnosis Date  . Alcohol abuse, in remission   . Atrial flutter (HCC)   . Back pain   . Cervicalgia   . Coronary artery disease   . Coronary atherosclerosis of unspecified type of vessel, native or graft   . Dementia   . Depression   . Diabetes mellitus without complication (HCC)   . Disturbance of skin sensation   . Hypertension   . MI, old   . Reflux   . Stroke (HCC)   . Unspecified hearing loss   . Unspecified sleep apnea     Patient Active Problem List   Diagnosis Date Noted  . Cerebral amyloid angiopathy (CODE) 11/24/2016  . Nontraumatic cortical hemorrhage of right cerebral hemisphere (HCC) 11/24/2016  . Vascular dementia 11/24/2015  . Atrial flutter, unspecified 11/24/2015  . Cognitive impairment 08/19/2015  . Type 2 diabetes mellitus with other circulatory complications (HCC) 05/14/2015  . Chronic systolic congestive heart failure (HCC) 05/14/2015  . ICH (intracerebral hemorrhage) (HCC)   . HLD (hyperlipidemia)   . Agitation   . Accelerated hypertension   . Encephalopathy acute 02/17/2015  . SVT (supraventricular tachycardia) (HCC) 02/16/2015  . Cytotoxic brain edema (HCC)   . Encounter for feeding tube placement   . CHF (congestive heart failure) (HCC)   . Atelectasis   . Acute respiratory failure with hypoxemia (HCC) 02/10/2015  . Hemorrhage of brain, nontraumatic (HCC) 02/08/2015  . Stroke due to intracerebral hemorrhage (HCC) 02/08/2015  .  Cervical disc disorder with radiculopathy of cervical region 09/14/2013  . Neck pain on left side 07/12/2013  . Paresthesia of left arm 07/12/2013  . Major depressive disorder, recurrent episode, severe (HCC) 04/30/2013  . Alcohol dependence in remission (HCC) 04/22/2013  . Hard of hearing 04/22/2013  . OBESITY, UNSPECIFIED 12/09/2009  . CARDIOMYOPATHY, SECONDARY 09/03/2009  . ATRIAL FLUTTER 09/03/2009  . Gout 03/12/2008  . HYPERGLYCEMIA 02/14/2008  . ANGINA PECTORIS 10/03/2007  . BARRETTS ESOPHAGUS 10/03/2007  . ARTHRITIS 10/03/2007  . WEIGHT LOSS 04/06/2007  . ANXIETY DEPRESSION 03/14/2007  . ABUSE, ALCOHOL, IN REMISSION 03/08/2007  . CORONARY ARTERY DISEASE 03/08/2007  . LACUNAR INFARCTION 03/08/2007  . COLONOSCOPY, HX OF 03/08/2007  . HYPOGONADISM 01/25/2007  . Hyperlipidemia 01/25/2007  . Essential hypertension 01/25/2007  . CONGESTIVE HEART FAILURE 01/25/2007  . GERD 01/25/2007  . SLEEP APNEA 01/25/2007    Past Surgical History:  Procedure Laterality Date  . HERNIA REPAIR    . IR GENERIC HISTORICAL  07/16/2016   IR REPLC GASTRO/COLONIC TUBE PERCUT W/FLUORO 07/16/2016 Irish Lack, MD [facility]-INTERV RAD  . IR GENERIC HISTORICAL  09/07/2016   IR GASTROSTOMY TUBE REMOVAL 09/07/2016 [facility]-INTERV RAD  . PEG PLACEMENT N/A 07/02/2016   Procedure: PERCUTANEOUS ENDOSCOPIC GASTROSTOMY (PEG) PLACEMENT;  Surgeon: Violeta Gelinas, MD;  Location: [facility] ENDOSCOPY;  Service: Endoscopy;  Laterality: N/A;  . ROTATOR CUFF REPAIR         Home Medications    Prior  to Admission medications   Medication Sig Start Date End Date Taking? Authorizing Provider  acetaminophen (TYLENOL) 325 MG tablet Take 650 mg by mouth every 4 (four) hours as needed for mild pain.   Yes [provider]  albuterol (PROVENTIL) (2.5 MG/3ML) 0.083% nebulizer solution Take 3 mLs (2.5 mg total) by nebulization 2 (two) times daily as needed for wheezing or shortness of breath. 02/21/15  Yes Rinehuls, Kinnie Scales, PA-C    allopurinol (ZYLOPRIM) 300 MG tablet Take 300 mg by mouth daily. For gout   Yes [provider]  atorvastatin (LIPITOR) 20 MG tablet Take 1 tablet (20 mg total) by mouth daily at 6 PM. 07/02/16  Yes Biby, Jani Files, NP  bisacodyl (DULCOLAX) 10 MG suppository Place 10 mg rectally daily as needed for moderate constipation. x2 days   Yes [provider]  carvedilol (COREG) 12.5 MG tablet Take 12.5 mg by mouth 2 (two) times daily with a meal. Patient takes 12.5 mg at lunch and 12.5 mg at dinner.   Yes [provider]  gabapentin (NEURONTIN) 300 MG capsule Take 300 mg by mouth daily.   Yes [provider]  geriatric multivitamins-minerals (ELDERTONIC/GEVRABON) ELIX Take 15 mLs by mouth 2 (two) times daily.   Yes [provider]  hydrALAZINE (APRESOLINE) 50 MG tablet Take 50 mg by mouth 4 (four) times daily.    Yes [provider]  lisinopril (PRINIVIL,ZESTRIL) 20 MG tablet Take 1 tablet (20 mg total) by mouth 2 (two) times daily. 07/06/16  Yes Marvel Plan, MD  loratadine (CLARITIN) 10 MG tablet Take 10 mg by mouth daily as needed for allergies.   Yes [provider]  metFORMIN (GLUCOPHAGE) 500 MG tablet Take 500 mg by mouth daily.    Yes [provider]  nitroGLYCERIN (NITROSTAT) 0.4 MG SL tablet Place 0.4 mg under the tongue every 5 (five) minutes as needed. Chest pain   Yes [provider]  NUTRITIONAL SUPPLEMENT LIQD Take 120 mLs by mouth 2 (two) times daily. NSA MedPass between breakfast and lunch and lunch and dinner   Yes [provider]  Nutritional Supplements (NUTRITIONAL SUPPLEMENT PO) Take 1 each by mouth daily. Magic Cup at [employer], Historical, MD  QUEtiapine (SEROQUEL) 25 MG tablet Take 1 tablet (25 mg total) by mouth at bedtime. 02/21/15  Yes Rinehuls, Kinnie Scales, PA-C  senna-docusate (SENOKOT-S) 8.6-50 MG tablet Take 1 tablet by mouth 2 (two) times daily. 07/02/16  Yes Layne Benton, NP   tamsulosin (FLOMAX) 0.4 MG CAPS capsule Take 0.4 mg by mouth daily.    Yes [provider]    Family History Family History  Problem Relation Age of Onset  . Anxiety disorder Mother   . Cancer Mother     Social History Social History  Substance Use Topics  . Smoking status: Former Games developer  . Smokeless tobacco: Never Used  . Alcohol use No     Allergies   Patient has no known allergies.   Review of Systems Review of Systems  Unable to perform ROS: Dementia     Physical Exam Updated Vital Signs BP (!) 165/74   Pulse 66   Resp 17   Ht 6\' 1"  (1.854 m)   Wt 200 lb (90.7 kg)   SpO2 97%   BMI 26.39 kg/m   Physical Exam  Constitutional: He appears well-developed.  Elderly, frail  HENT:  Head: Normocephalic and atraumatic.  Right Ear: External ear normal.  Left Ear: External  ear normal.  Eyes: Conjunctivae and EOM are normal. Pupils are equal, round, and reactive to light.  Neck: Normal range of motion and phonation normal. Neck supple.  Cardiovascular: Normal rate, regular rhythm and normal heart sounds.   Pulmonary/Chest: Effort normal and breath sounds normal. He exhibits no bony tenderness.  Abdominal: Soft. There is no tenderness.  Musculoskeletal: Normal range of motion.  Normal strength arms and legs bilaterally.  Neurological: He is alert. No cranial nerve deficit or sensory deficit. He exhibits normal muscle tone. Coordination normal.  No dysarthria, or aphasia.  No apparent nystagmus.  He is a poor historian.  Skin: Skin is warm, dry and intact.  Psychiatric: He has a normal mood and affect. His behavior is normal.  Nursing note and vitals reviewed.    ED Treatments / Results  Labs (all labs ordered are listed, but only abnormal results are displayed) Labs Reviewed  CBC - Abnormal; Notable for the following:       Result Value   Hemoglobin 12.9 (*)    All other components within normal limits  COMPREHENSIVE METABOLIC PANEL - Abnormal;  Notable for the following:    CO2 20 (*)    ALT 11 (*)    All other components within normal limits  URINALYSIS, COMPLETE (UACMP) WITH MICROSCOPIC - Abnormal; Notable for the following:    Protein, ur 100 (*)    Bacteria, UA RARE (*)    Squamous Epithelial / LPF 0-5 (*)    All other components within normal limits  I-STAT CHEM 8, ED - Abnormal; Notable for the following:    Calcium, Ion 1.11 (*)    All other components within normal limits  PROTIME-INR  APTT  DIFFERENTIAL  RAPID URINE DRUG SCREEN, HOSP PERFORMED  ETHANOL  I-STAT TROPOININ, ED    EKG  EKG Interpretation  Date/Time:  Saturday Feb 19 2017 14:43:41 EDT Ventricular Rate:  70 PR Interval:    QRS Duration: 93 QT Interval:  366 QTC Calculation: 395 R Axis:   58 Text Interpretation:  Sinus rhythm Borderline prolonged PR interval Consider left ventricular hypertrophy Anterior Q waves, possibly due to LVH Nonspecific T abnormalities, lateral leads since last tracing no significant change Confirmed by Mancel BaleWentz, Keshonna Valvo [phone] on 02/19/2017 4:04:47 PM       Radiology No results found.  Procedures Procedures (including critical care time)  Medications Ordered in ED Medications - No data to display   Initial Impression / Assessment and Plan / ED Course  I have reviewed the triage vital signs and the nursing notes.  Pertinent labs & imaging results that were available during my care of the patient were reviewed by me and considered in my medical decision making (see chart for details).      Patient Vitals for the past 24 hrs:  BP Pulse Resp SpO2 Height Weight  02/19/17 1530 (!) 165/74 66 17 97 % - -  02/19/17 1515 (!) 161/81 66 15 98 % - -  02/19/17 1500 140/89 68 15 96 % - -  02/19/17 1445 (!) 158/76 70 14 97 % - -  02/19/17 1430 (!) 151/77 71 15 98 % - -  02/19/17 1415 (!) 144/82 72 13 95 % - -  02/19/17 1358 - - - - 6\' 1"  (1.854 m) 200 lb (90.7 kg)    4:17 PM Reevaluation with update and discussion.  After initial assessment and treatment, an updated evaluation reveals patient remains calm comfortable conversant.  His wife is here now and states  that he "looks great.".  Patient's wife describes 2 minute episodes of kicking with his feet, with decreased responsiveness followed by waking up when EMS got there.  No prior history of similar episodes.  No other issues recently according to the wife.  There are no other known modifying factors.Mancel Bale L    Final Clinical Impressions(s) / ED Diagnoses   Final diagnoses:  Seizure (HCC)    Clinical history is consistent with seizure.  Patient with history of intracranial bleeding, but no history of seizure.  No evidence for infection or metabolic instability.   Head CT pending-16: 15  Care to oncoming team to evaluate post return of brain imaging.  Nursing Notes Reviewed/ Care Coordinated Applicable Imaging Reviewed Interpretation of Laboratory Data incorporated into ED treatment   New Prescriptions New Prescriptions   No medications on file     Mancel Bale, MD 02/20/17 [phone]

## 2017-02-21 ENCOUNTER — Telehealth: Payer: Self-pay | Admitting: Neurology

## 2017-02-21 NOTE — Telephone Encounter (Signed)
Patients called and requested to speak with the nurse regarding her husbands recent admissions to the hospital. She stated that he had a seizure and had to go to the ED and they have informed her to make Dr. Roda ShuttersXu aware. Please call and advise.

## 2017-02-21 NOTE — Telephone Encounter (Signed)
Spoke to wife, pt has not had any other sz or episodes. He is not on medication.   Dr. Roda ShuttersXu in the hospital this week.  NX for other MD (Ahern or Penumalli).

## 2017-02-22 NOTE — Telephone Encounter (Signed)
Message sent to Dr. Roda ShuttersXu. Dr. Roda ShuttersXu pt was in Ed for seizures. Pt was previously on keppra, but not know please advise.See notes thanks.

## 2017-02-23 IMAGING — CR DG ABDOMEN 1V
1 series · 1 of 1 positions shown · non-contrast
Comparison: 06/29/2016

CLINICAL DATA: Evaluate gastrostomy tube placement.

EXAM:
ABDOMEN - 1 VIEW

[x abdomen supine]
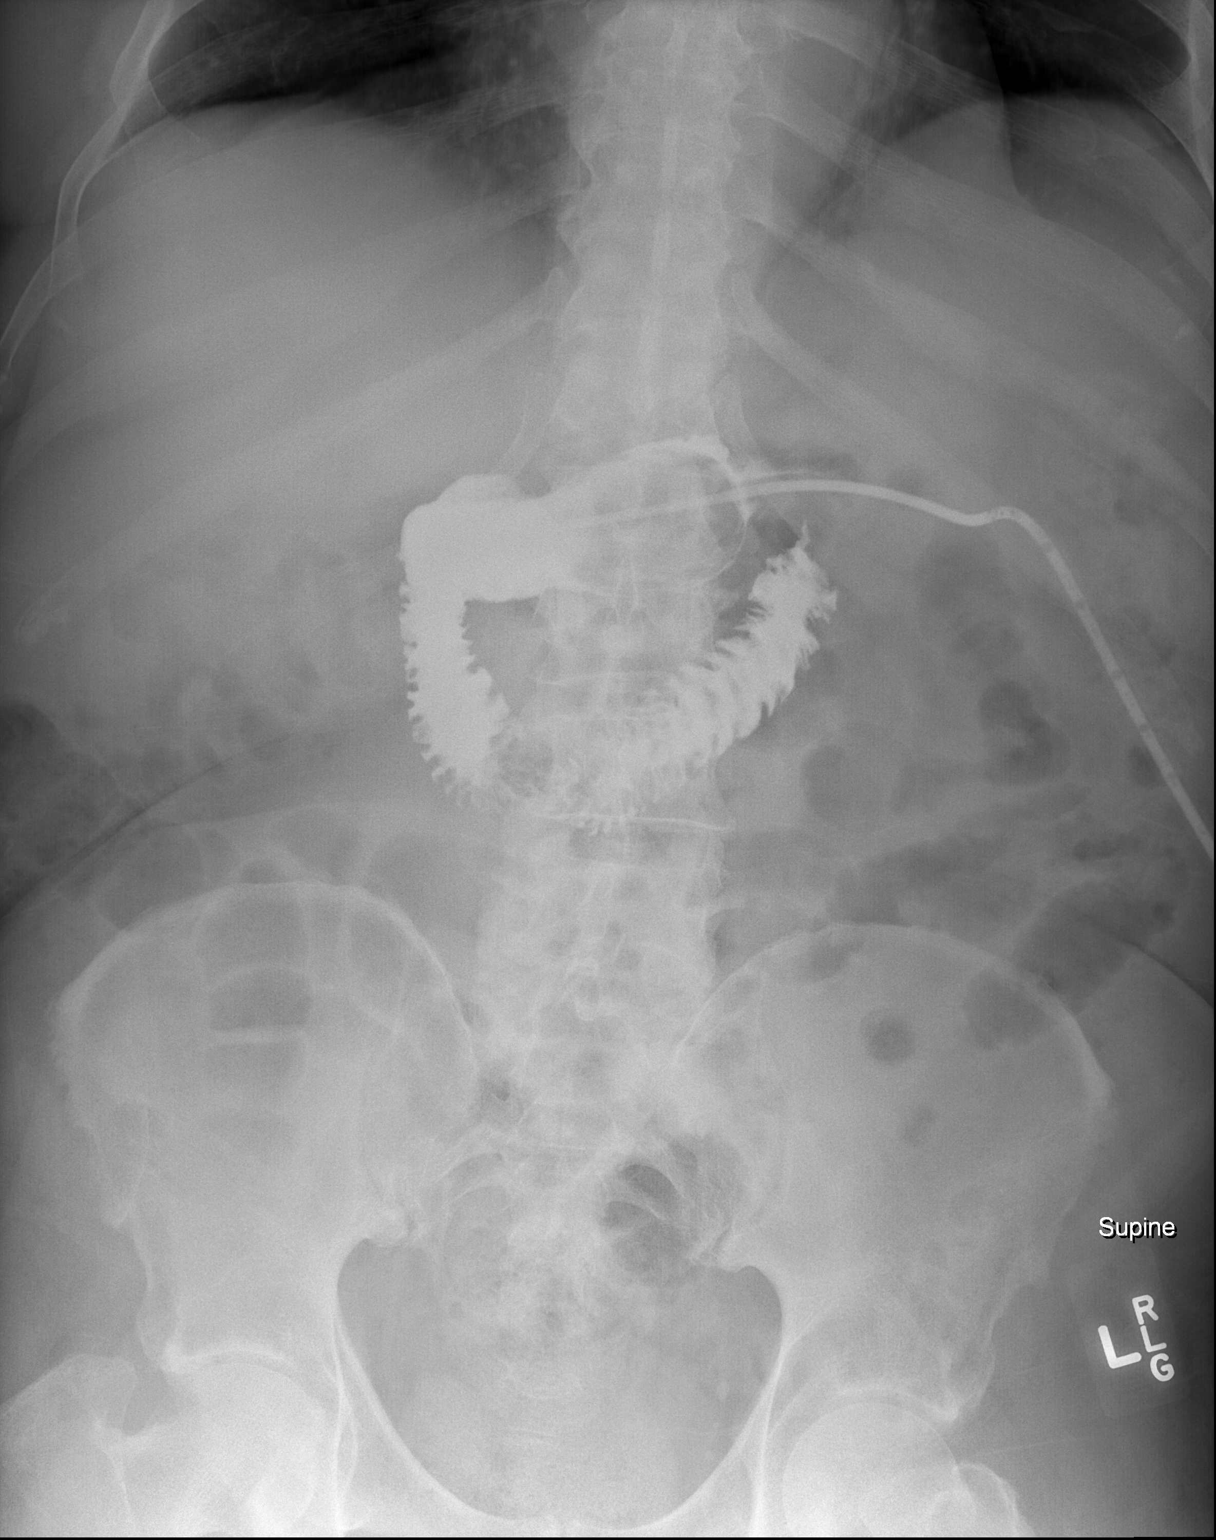

[1 of 1 positions shown; findings below may reference images not displayed]

FINDINGS: Contrast was injected through a small bore catheter with balloon
retention. Catheter is positioned in the stomach. Contrast fills the
distal stomach and the duodenum.
IMPRESSION: Gastrostomy tube is positioned in the stomach.

## 2017-02-24 NOTE — Telephone Encounter (Signed)
I will see him 03/01/17 for ED follow up. Thanks.   Marvel PlanJindong Naraya Stoneberg, MD PhD Stroke Neurology 02/24/2017 11:46 AM

## 2017-03-01 ENCOUNTER — Ambulatory Visit (INDEPENDENT_AMBULATORY_CARE_PROVIDER_SITE_OTHER): Payer: Medicare Other | Admitting: Neurology

## 2017-03-01 ENCOUNTER — Encounter: Payer: Self-pay | Admitting: Neurology

## 2017-03-01 VITALS — BP 125/78 | HR 60 | Wt 187.0 lb

## 2017-03-01 DIAGNOSIS — I611 Nontraumatic intracerebral hemorrhage in hemisphere, cortical: Secondary | ICD-10-CM | POA: Diagnosis not present

## 2017-03-01 DIAGNOSIS — R569 Unspecified convulsions: Secondary | ICD-10-CM | POA: Diagnosis not present

## 2017-03-01 DIAGNOSIS — I680 Cerebral amyloid angiopathy: Secondary | ICD-10-CM | POA: Diagnosis not present

## 2017-03-01 DIAGNOSIS — R4189 Other symptoms and signs involving cognitive functions and awareness: Secondary | ICD-10-CM | POA: Diagnosis not present

## 2017-03-01 DIAGNOSIS — E785 Hyperlipidemia, unspecified: Secondary | ICD-10-CM

## 2017-03-01 DIAGNOSIS — I10 Essential (primary) hypertension: Secondary | ICD-10-CM | POA: Diagnosis not present

## 2017-03-01 NOTE — Progress Notes (Signed)
STROKE NEUROLOGY FOLLOW UP NOTE  NAME: Austin Sawyer DOB: Jul 14, 1935  REASON FOR VISIT: stroke follow up HISTORY FROM: chart and pt and wife  Today we had the pleasure of seeing Austin Sawyer in follow-up at our Neurology Clinic. Pt was accompanied by wife.   History Summary Austin Sawyer is a 82 y.o. male with history of HTN, DM, CAD, atrial flutter on Coumadin before until 2011, alcohol abuse admitted on 02/08/15 for headache, nausea vomiting, confusion, blurred vision. CT had showed left occipital ICH with small SAH, SDH and IVH. Put on 3% saline and admitted to ICU. He was then stabilized and off 3% saline. Stroke work up including MRA, CUS, 2D ehco, and EEG were negative. Repeat CT head stable. LDL 107. He was discharged to SNF after medically ready.   Follow up 05/14/15 - the patient has been doing well. He stayed in SNF until one month ago. He is currently at home with home PT/OT/speech 3 times per week. He still has right hemoanopia and will follow up with ophthalmology tomorrow. He also follows up with PCP next month. BP today 137/77. He is on 4 BP meds now.     Follow up 08/18/15 - pt has been doing well. Glucose at home is OK, around 80s. BP today in clinic 125/75. Wife stated that pt seems to have significant cognitive impairment after stroke. Can not read, write, or doing math as per wife after stroke. Pt also repeat his questions during clinic visit. His right hemianopia seems somewhat better, he will see ophthal tomorrow for follow up. BP 125/75 today.  Follow up 11/2015 - pt has been doing the same. Had ophthalmology follow up and had new prescription glasses. No significant visual field changes, still has right hemianopia. Wife still reports cognitive impairment.  05/24/16 follow up - Pt has been doing well. Seems right hemianopia improved, able to see shadow of hands, but still blurry. Lost glasses that ophthalmology prescribed to him. As per wife, his memory is stable but still has a  lot of mood swings. BP 120/75.  06/24/16 admission - pt admitted to [facility] for confusion and behavior changes for one week. CT head showed right frontal ICH with midline shift in setting of hypertension due to noncompliance or CAA given recurrent lobar ICHs. EEG no seizure, EF 60-65%. BP controlled with IV and then po meds. Due to concern of CAA, no antiplatelet was recommended on discharge. He was sent to SNF with lipitor and seroquel. Had PEG placement for dysphagia. Gout was treated with colchicine and allopurinol.   11/24/16 follow up - pt has been doing well. He is more awake alert with fluent language today in clinic. However, due to baseline dementia, her speech frequently off topic with perseveration. Came in wheelchair but able to walk in NH. Currently, wife is dealing with daughter/son for pt guardianship. She has court appearance next Monday. His PEG tube was removed in 09/2016. BP still high 178/80 today and no paperwork from SNF for BP monitoring except BP 160/85 on 08/28/16. He is on 3 BP meds currently in SNF.   02/21/17 ED visit - pt was sitting in recliner, and had sudden onset eyes rolling back, LOC, shaking in all extremities, mouth foaming, lasted 3-4 min. 911 called, on arrival, pt started to wake up but still not able to communicate. However, in ED, pt was back to baseline. CT head negative for acute changes. Wife denies recent infection. UDA and UA negative. He was discharged from  ED and requested follow up urgently in clinic.  Interval History During the interval time, pt has been doing at his neuro baseline. No recurrent seizure so far. He and his wife denied any previous seizure history or any recent medication changes. He receives his medication from [place] and [facility]. Wife would like medication to be ordered from [place].   REVIEW OF SYSTEMS: Full 14 system review of systems performed and notable only for those listed below and in HPI above, all others  are negative:  Constitutional:  Activity change, appetite change, fatigue Cardiovascular: leg swelling Ear/Nose/Throat:  Hearing loss Skin:  Eyes:   Respiratory:   Gastroitestinal:   Genitourinary:  Hematology/Lymphatic:   Endocrine:  Musculoskeletal:  Allergy/Immunology:   Neurological: memory loss, HA, seizure, weakness Psychiatric: agitation, behavior problem, confusion, nervous/anxious Sleep:  Daytime sleepiness  The following represents the patient's updated allergies and side effects list: No Known Allergies  The neurologically relevant items on the patient's problem list were reviewed on today's visit.  Neurologic Examination  A problem focused neurological exam (12 or more points of the single system neurologic examination, vital signs counts as 1 point, cranial nerves count for 8 points) was performed.  Blood pressure 125/78, pulse 60, weight 187 lb (84.8 kg).  General - Well nourished, well developed, in no apparent distress, pleasant.  Ophthalmologic - Fundi not visualized due to noncooperation.  Cardiovascular - Regular rate and rhythm.  Mental Status -  Awake alert, orientated to place and people, but not time or situation.  Language exam showed fluent speech, but frequent off topic with perseveration. Able to follow some simple commands, but still has difficulty with complete comprehension. Naming 2 out of 5 with perseveration and not able to repeat not meaningful sentences. Concentration and attention impaired, not able to perform backward spelling or calculation. Fund of knowledge severely impaired. Delayed recall 0/3.   Cranial Nerves II - XII - II - not cooperative on exam, but able to blink bilaterally to visual threat III, IV, VI - eye movement intact. V - Facial sensation intact bilaterally. VII - Facial movement intact bilaterally. VIII - Hearing & vestibular intact bilaterally. X - Palate elevates symmetrically. XI - Chin turning & shoulder shrug  intact bilaterally. XII - Tongue protrusion intact.  Motor Strength - The patient's strength was normal in all extremities and pronator drift was absent.  Bulk was normal and fasciculations were absent.   Motor Tone - Muscle tone was assessed at the neck and appendages and was normal.  Reflexes - The patient's reflexes were 1+ in all extremities and he had no pathological reflexes.  Sensory - Light touch, temperature/pinprick were assessed and were normal.    Coordination - The patient had normal movements in the hands and feet with no ataxia or dysmetria.  Tremor was absent.  Gait and Station - not tested, in wheelchair.  Data reviewed: I personally reviewed the images and agree with the radiology interpretations.  Ct Head Wo Contrast 02/16/2015 - Stable subarachnoid hemorrhage seen overlying right parietal cortex. Stable large left occipital intraparenchymal hemorrhage is noted. with surrounding white matter edema resulting in approximately 6 mL. of left to right midline shift. Stable left-sided subdural hematoma is noted. Stable bilateral lateral intraventricular hemorrhage is noted without ventricular dilatation.  02/10/2015 - 1. No significant interval change in size of left occipital lobe intraparenchymal hematoma with slightly increased associated vasogenic edema as compared to previous. Similar trace left-to-right shift. 2. Stable  to slightly decreased conspicuity of bilateral small volume subarachnoid hemorrhage. 3. Stable intraventricular hemorrhage. No hydrocephalus. 4. Persistent left subdural hematoma measuring up to 3.5 mm in maximal diameter. Blood along the left tentorium is decreased in conspicuity. 5. No new intracranial abnormality.  02/09/2015 IMPRESSION: 1. Slight enlargement of large left occipital lobe intra-axial hemorrhage. Estimated hemorrhage volume now 58 mm. Stable left posterior hemisphere edema and mass effect. 2. Mild progression of subarachnoid  extension, now bilateral. Extension and of a left subdural space with small broad-based left subdural hematoma not significantly changed. Stable intraventricular hemorrhage volume. No ventriculomegaly. 3. No new intracranial abnormality identified.   02/08/2015 IMPRESSION: 1. 6.7 cm left occipital parenchymal hemorrhage with mild surrounding edema. No midline shift. 2. Small amount of adjacent subarachnoid hemorrhage and small amount of intraventricular extension of blood. 3. Small subdural hematomas over the left cerebral convexity and left tentorium.   Dg Chest Port 1 View 02/13/15 1. Stable support apparatus. 2. Improved basilar atelectasis. 02/10/15 Tube and catheter positions as described without pneumothorax. No edema or consolidation. 02/09/2015 IMPRESSION: No acute cardiopulmonary disease.   Carotid Doppler No evidence of significant stenosis in the right or left ICA. The left side was technically challenged due to line placement.   2D Echocardiogram Left ventricle: The cavity size was normal. Wall thickness was increased in a pattern of moderate LVH. Systolic function was normal. The estimated ejection fraction was in the range of 55% to 60%. Doppler parameters are consistent with elevated ventricular end-diastolic filling pressure. - Aortic valve: Poorly seen Moderately calcified mild stenosis by gradients but may be underestimated due to poor CW angle. Valve area (VTI): 2.11 cm^2. Valve area (Vmax): 2.01 cm^2. Valve area (Vmean): 1.88 cm^2. - Mitral valve: There was mild regurgitation. - Left atrium: The atrium was moderately dilated. - Impressions: Cannot r/o SOE due to extremely poor image quality. Impressions: - Cannot r/o SOE due to extremely poor image quality.  MRI and MRA  7.3 x 3.3 cm left occipital hematoma unchanged from the prior CT. There is intraventricular hemorrhage and subarachnoid hemorrhage also unchanged. There is mass-effect on the left  occipital horn and 3 mm midline shift to the right. No evidence of underlying tumor or vascular malformation. This may be related to cerebral amyloid angiography. Hypertension also possible. Negative MRA circle Willis.  EEG 06/25/16 This EEG is abnormal with moderately severe generalized continuous nonspecific slowing of cerebral activity. No evidence of epileptic activity was recorded. The absence of epileptiform activity during an EEG recording does not, in and of itself, rule out seizure disorder, however.  EKG NSR  Ct Head Wo Contrast 02/19/2017 IMPRESSION: No acute intracranial process. RIGHT frontal lobe encephalomalacia at site of prior hemorrhage. Old LEFT PCA territory infarct. Moderate to severe chronic small vessel ischemic disease.    Component     Latest Ref Rng 02/09/2015  Cholesterol     0 - 200 mg/dL 161178  Triglycerides     <150 mg/dL 47  HDL Cholesterol     >40 mg/dL 62  Total CHOL/HDL Ratio      2.9  VLDL     0 - 40 mg/dL 9  LDL (calc)     0 - 99 mg/dL 096107 (H)  Hemoglobin E4VA1C     4.8 - 5.6 % 6.0 (H)  Mean Plasma Glucose      126  TSH     0.350 - 4.500 uIU/mL 1.154   07/19/16 A1C 6.4, AST 54 and ALT 113  Assessment: As  you may recall, he is a 82 y.o. African American male with PMH of HTN, DM, CAD, atrial flutter on Coumadin before until 2010, alcohol abuse admitted on 02/08/15 for left occipital ICH with SAH, SDH and IVH. Recovered well with no need of surgical intervention. Stroke work up largely negative except LDL 107. Etiology still most likely HTN but CAA not able to completely ruled out. Pt later was sent to SNF and then home. Still has significant language deficit and hemianopia. BP stable on 4 BP meds. Repeat CT showed resolution of bleed and will put on ASA 81mg . Has hx of afib on coumadin but in 2011 Dr. Antoine Poche considered his Aflutter was transient and cured, and he was then off coumadin. Pt still has significant language and cognitive impairment, and MMSE  6/30. More consistent with vascular dementia. In 06/24/16, he was admitted for right frontal ICH in setting of hypertension due to noncompliance or CAA given recurrent lobar ICHs. EEG no seizure, EF 60-65%. BP controlled with IV and then po meds. Due to concern of CAA, no antiplatelet was recommended on discharge. He was sent to SNF with lipitor and seroquel. PEG later removed in SNF. BP much better controlled. Now back to home, however, had new onset seizure GTC on 02/21/17. Will repeat EEG and plan to start lamictal for seizure control.  Plan:  - continue lipitor for stroke prevention - avoid antiplatelet due to concerns of CAA - check BP and glucose at home and record  - no driving due to dementia, seizure and vision changes - will check EEG for seizure evaluation - will start lamictal for seizure control with slow titration. Wife would like to receive the medication from [place] on [facility] with [facility].  - Follow up with your primary care physician for stroke risk factor modification. Recommend maintain blood pressure goal <130/80, diabetes with hemoglobin A1c goal below 6.5% and lipids with LDL cholesterol goal below 70 mg/dL.  - continue seroquel for behavior disturbance.  - follow up on 05/24/17 as scheduled  I spent more than 25 minutes of face to face time with the patient. Greater than 50% of time was spent in counseling and coordination of care. We have discussed about seizure precautions, EEG repeat, seizure medication to start in [place], and no antiplatelet therapy.    Orders Placed This Encounter  Procedures  . EEG adult    Standing Status:   Future    Standing Expiration Date:   03/01/2018    No orders of the defined types were placed in this encounter.   Patient Instructions  - continue lipitor for stroke prevention - avoid antiplatelet due to concerns of CAA - check BP and glucose at home and record  - no driving due to dementia and vision changes - will check EEG for seizure  evaluation - will start lamictal for seizure control. Start with 25mg  at night for 2 weeks, then 25mg  twice a day for 2 weeks, and then 50mg  twice a day for 2 weeks, then 75mg  twice a day for 2 weeks and then 100mg  twice a day afterward. Please get the medication from [place] on 03/08/17 with [facility].  - Follow up with your primary care physician for stroke risk factor modification. Recommend maintain blood pressure goal <130/80, diabetes with hemoglobin A1c goal below 6.5% and lipids with LDL cholesterol goal below 70 mg/dL.  - continue seroquel for behavior disturbance.  - follow up on 05/24/17 as scheduled   Marvel Plan, MD  PhD [facility] [address] [place], [place] [address] [phone]

## 2017-03-01 NOTE — Patient Instructions (Addendum)
-   continue lipitor for stroke prevention - avoid antiplatelet due to concerns of CAA - check BP and glucose at home and record  - no driving due to dementia and vision changes - will check EEG for seizure evaluation - will start lamictal for seizure control. Start with 25mg  at night for 2 weeks, then 25mg  twice a day for 2 weeks, and then 50mg  twice a day for 2 weeks, then 75mg  twice a day for 2 weeks and then 100mg  twice a day afterward. Please get the medication from [facility] on [facility] with [facility].  - Follow up with your primary care physician for stroke risk factor modification. Recommend maintain blood pressure goal <130/80, diabetes with hemoglobin A1c goal below 6.5% and lipids with LDL cholesterol goal below 70 mg/dL.  - continue seroquel for behavior disturbance.  - follow up on 05/24/17 as scheduled

## 2017-03-31 ENCOUNTER — Other Ambulatory Visit: Payer: Medicare Other

## 2017-04-07 ENCOUNTER — Ambulatory Visit (INDEPENDENT_AMBULATORY_CARE_PROVIDER_SITE_OTHER): Payer: Medicare Other | Admitting: Neurology

## 2017-04-07 DIAGNOSIS — R569 Unspecified convulsions: Secondary | ICD-10-CM | POA: Diagnosis not present

## 2017-04-11 ENCOUNTER — Telehealth: Payer: Self-pay

## 2017-04-11 NOTE — Telephone Encounter (Addendum)
Rn notified patients wife that the EEG showed no seizure. Continue treatment plan. Pts wife stated her husband  never [facility] June 2018. Rn stated per Dr. Roda ShuttersXu last note they discuss lamictal medication which requires titration. Rn also notified Dr. Roda ShuttersXu if he wanted to start the lamictal. Per Dr. Roda ShuttersXu pt has appt in August 2018, and it can be discuss at that time. Rn requested r/s her husbands [facility] appt so he can receive the lamictal when it gets prescribed a next visit. PTs wife will [facility] the [facility] to r[facility]s. PTs wife verbalized understanding of calling the [facility] to r/s [facility] appt, and that the lamictal will be address and prescribed at August 2018 visit.

## 2017-04-11 NOTE — Telephone Encounter (Signed)
Revised note. 

## 2017-04-24 ENCOUNTER — Encounter (HOSPITAL_BASED_OUTPATIENT_CLINIC_OR_DEPARTMENT_OTHER): Payer: Self-pay | Admitting: *Deleted

## 2017-04-24 ENCOUNTER — Emergency Department (HOSPITAL_BASED_OUTPATIENT_CLINIC_OR_DEPARTMENT_OTHER)
Admission: EM | Admit: 2017-04-24 | Discharge: 2017-04-24 | Disposition: A | Discharge status: Home / Self Care | Payer: Medicare Other | Attending: Emergency Medicine | Admitting: Emergency Medicine

## 2017-04-24 ENCOUNTER — Emergency Department (HOSPITAL_BASED_OUTPATIENT_CLINIC_OR_DEPARTMENT_OTHER): Payer: Medicare Other

## 2017-04-24 DIAGNOSIS — I2510 Atherosclerotic heart disease of native coronary artery without angina pectoris: Secondary | ICD-10-CM | POA: Diagnosis not present

## 2017-04-24 DIAGNOSIS — S0990XA Unspecified injury of head, initial encounter: Secondary | ICD-10-CM | POA: Diagnosis not present

## 2017-04-24 DIAGNOSIS — R569 Unspecified convulsions: Secondary | ICD-10-CM | POA: Diagnosis not present

## 2017-04-24 DIAGNOSIS — I110 Hypertensive heart disease with heart failure: Secondary | ICD-10-CM | POA: Insufficient documentation

## 2017-04-24 DIAGNOSIS — Z79899 Other long term (current) drug therapy: Secondary | ICD-10-CM | POA: Insufficient documentation

## 2017-04-24 DIAGNOSIS — R9431 Abnormal electrocardiogram [ECG] [EKG]: Secondary | ICD-10-CM | POA: Diagnosis not present

## 2017-04-24 DIAGNOSIS — S299XXA Unspecified injury of thorax, initial encounter: Secondary | ICD-10-CM | POA: Diagnosis not present

## 2017-04-24 DIAGNOSIS — Z8673 Personal history of transient ischemic attack (TIA), and cerebral infarction without residual deficits: Secondary | ICD-10-CM | POA: Diagnosis not present

## 2017-04-24 DIAGNOSIS — I5022 Chronic systolic (congestive) heart failure: Secondary | ICD-10-CM | POA: Insufficient documentation

## 2017-04-24 DIAGNOSIS — F0390 Unspecified dementia without behavioral disturbance: Secondary | ICD-10-CM | POA: Insufficient documentation

## 2017-04-24 DIAGNOSIS — Z87891 Personal history of nicotine dependence: Secondary | ICD-10-CM | POA: Insufficient documentation

## 2017-04-24 DIAGNOSIS — Z7984 Long term (current) use of oral hypoglycemic drugs: Secondary | ICD-10-CM | POA: Insufficient documentation

## 2017-04-24 DIAGNOSIS — S199XXA Unspecified injury of neck, initial encounter: Secondary | ICD-10-CM | POA: Diagnosis not present

## 2017-04-24 DIAGNOSIS — E1159 Type 2 diabetes mellitus with other circulatory complications: Secondary | ICD-10-CM | POA: Diagnosis not present

## 2017-04-24 HISTORY — DX: Unspecified convulsions: R56.9

## 2017-04-24 LAB — CBC
HEMATOCRIT: 36.9 % — AB (ref 39.0–52.0)
Hemoglobin: 12.3 g/dL — ABNORMAL LOW (ref 13.0–17.0)
MCH: 29.9 pg (ref 26.0–34.0)
MCHC: 33.3 g/dL (ref 30.0–36.0)
MCV: 89.6 fL (ref 78.0–100.0)
Platelets: 144 10*3/uL — ABNORMAL LOW (ref 150–400)
RBC: 4.12 MIL/uL — AB (ref 4.22–5.81)
RDW: 13.9 % (ref 11.5–15.5)
WBC: 7.3 10*3/uL (ref 4.0–10.5)

## 2017-04-24 LAB — COMPREHENSIVE METABOLIC PANEL
ALT: 13 U/L — ABNORMAL LOW (ref 17–63)
AST: 26 U/L (ref 15–41)
Albumin: 3.8 g/dL (ref 3.5–5.0)
Alkaline Phosphatase: 49 U/L (ref 38–126)
Anion gap: 13 (ref 5–15)
BUN: 19 mg/dL (ref 6–20)
CHLORIDE: 105 mmol/L (ref 101–111)
CO2: 23 mmol/L (ref 22–32)
Calcium: 8.9 mg/dL (ref 8.9–10.3)
Creatinine, Ser: 1.21 mg/dL (ref 0.61–1.24)
GFR, EST NON AFRICAN AMERICAN: 55 mL/min — AB (ref >60)
Glucose, Bld: 127 mg/dL — ABNORMAL HIGH (ref 65–99)
POTASSIUM: 3.9 mmol/L (ref 3.5–5.1)
SODIUM: 141 mmol/L (ref 135–145)
Total Bilirubin: 0.7 mg/dL (ref 0.3–1.2)
Total Protein: 6.8 g/dL (ref 6.5–8.1)

## 2017-04-24 LAB — URINALYSIS, ROUTINE W REFLEX MICROSCOPIC
Bilirubin Urine: NEGATIVE
GLUCOSE, UA: NEGATIVE mg/dL
HGB URINE DIPSTICK: NEGATIVE
Ketones, ur: NEGATIVE mg/dL
LEUKOCYTES UA: NEGATIVE
Nitrite: NEGATIVE
PH: 5.5 (ref 5.0–8.0)
Protein, ur: 100 mg/dL — AB
Specific Gravity, Urine: 1.019 (ref 1.005–1.030)

## 2017-04-24 LAB — URINALYSIS, MICROSCOPIC (REFLEX)

## 2017-04-24 MED ORDER — GABAPENTIN 600 MG PO TABS
300.0000 mg | ORAL_TABLET | Freq: Once | ORAL | Status: AC
  Administered 2017-04-24: 300 mg via ORAL
  Filled 2017-04-24: qty 1

## 2017-04-24 MED ORDER — GABAPENTIN 300 MG PO CAPS: 300.0000 mg | ORAL_CAPSULE | Freq: Three times a day (TID) | ORAL | 0 refills | Status: DC

## 2017-04-24 NOTE — ED Provider Notes (Signed)
[facility] Provider Note   CSN: [id number] Arrival date & time: 04/24/17  [id number]     History   Chief Complaint Chief Complaint  Patient presents with  . Seizures    HPI Austin Sawyer is a 82 y.o. male.  82 year old male with extensive past medical history including cerebral amyloid angiopathy, ICH, dementia, seizures, CAD who p/w seizure. Patient was brought in by EMS after wife called when she heard him fall in the bathroom, found him on the floor having a generalized tonic-clonic seizure. She reports the seizure lasted a few minutes. When EMS arrived, patient was postictal, CBG 120. Patient denies any complaints of pain and does not recall events.  LEVEL 5 CAVEAT DUE TO DEMENTIA   The history is provided by the EMS personnel and the spouse.  Seizures      Past Medical History:  Diagnosis Date  . Alcohol abuse, in remission   . Atrial flutter (HCC)   . Back pain   . Cervicalgia   . Coronary artery disease   . Coronary atherosclerosis of unspecified type of vessel, native or graft   . Dementia   . Depression   . Diabetes mellitus without complication (HCC)   . Disturbance of skin sensation   . Hypertension   . MI, old   . Reflux   . Seizures (HCC)   . Stroke (HCC)   . Unspecified hearing loss   . Unspecified sleep apnea     Patient Active Problem List   Diagnosis Date Noted  . Seizures (HCC) 03/01/2017  . Cerebral amyloid angiopathy (CODE) 11/24/2016  . Nontraumatic cortical hemorrhage of right cerebral hemisphere (HCC) 11/24/2016  . Vascular dementia 11/24/2015  . Atrial flutter, unspecified 11/24/2015  . Cognitive impairment 08/19/2015  . Type 2 diabetes mellitus with other circulatory complications (HCC) 05/14/2015  . Chronic systolic congestive heart failure (HCC) 05/14/2015  . ICH (intracerebral hemorrhage) (HCC)   . HLD (hyperlipidemia)   . Agitation   . Accelerated hypertension   . Encephalopathy acute 02/17/2015  . SVT  (supraventricular tachycardia) (HCC) 02/16/2015  . Cytotoxic brain edema (HCC)   . Encounter for feeding tube placement   . CHF (congestive heart failure) (HCC)   . Atelectasis   . Acute respiratory failure with hypoxemia (HCC) 02/10/2015  . Hemorrhage of brain, nontraumatic (HCC) 02/08/2015  . Stroke due to intracerebral hemorrhage (HCC) 02/08/2015  . Cervical disc disorder with radiculopathy of cervical region 09/14/2013  . Neck pain on left side 07/12/2013  . Paresthesia of left arm 07/12/2013  . Major depressive disorder, recurrent episode, severe (HCC) 04/30/2013  . Alcohol dependence in remission (HCC) 04/22/2013  . Hard of hearing 04/22/2013  . OBESITY, UNSPECIFIED 12/09/2009  . CARDIOMYOPATHY, SECONDARY 09/03/2009  . ATRIAL FLUTTER 09/03/2009  . Gout 03/12/2008  . HYPERGLYCEMIA 02/14/2008  . ANGINA PECTORIS 10/03/2007  . BARRETTS ESOPHAGUS 10/03/2007  . ARTHRITIS 10/03/2007  . WEIGHT LOSS 04/06/2007  . ANXIETY DEPRESSION 03/14/2007  . ABUSE, ALCOHOL, IN REMISSION 03/08/2007  . CORONARY ARTERY DISEASE 03/08/2007  . LACUNAR INFARCTION 03/08/2007  . COLONOSCOPY, HX OF 03/08/2007  . HYPOGONADISM 01/25/2007  . Hyperlipidemia 01/25/2007  . Essential hypertension 01/25/2007  . CONGESTIVE HEART FAILURE 01/25/2007  . GERD 01/25/2007  . SLEEP APNEA 01/25/2007    Past Surgical History:  Procedure Laterality Date  . HERNIA REPAIR    . IR GENERIC HISTORICAL  07/16/2016   IR REPLC GASTRO/COLONIC TUBE PERCUT W/FLUORO 07/16/2016 Irish LackGlenn Yamagata, MD [facility]INTER[facility] RAD  . IR GENERIC HISTORICAL  09/07/2016   IR GASTROSTOMY TUBE REMOVAL 09/07/2016 [facility]INTER[facility]  . PEG PLACEMENT N/A 07/02/2016   Procedure: PERCUTANEOUS ENDOSCOPIC GASTROSTOMY (PEG) PLACEMENT;  Surgeon: Violeta Gelinas, MD;  Location: [facility] ENDOSCOPY;  Service: Endoscopy;  Laterality: N/A;  . ROTATOR CUFF REPAIR         Home Medications    Prior to Admission medications   Medication Sig Start Date End Date Taking?  Authorizing Provider  acetaminophen (TYLENOL) 325 MG tablet Take 650 mg by mouth every 4 (four) hours as needed for mild pain.    [provider]  albuterol (PROVENTIL) (2.5 MG/3ML) 0.083% nebulizer solution Take 3 mLs (2.5 mg total) by nebulization 2 (two) times daily as needed for wheezing or shortness of breath. 02/21/15   Rinehuls, Kinnie Scales, PA-C  allopurinol (ZYLOPRIM) 300 MG tablet Take 300 mg by mouth daily. For gout    [provider]  atorvastatin (LIPITOR) 20 MG tablet Take 1 tablet (20 mg total) by mouth daily at 6 PM. 07/02/16   Layne Benton, NP  bisacodyl (DULCOLAX) 10 MG suppository Place 10 mg rectally daily as needed for moderate constipation. x2 days    [provider]  carvedilol (COREG) 12.5 MG tablet Take 12.5 mg by mouth 2 (two) times daily with a meal. Patient takes 12.5 mg at lunch and 12.5 mg at dinner.    [provider]  gabapentin (NEURONTIN) 300 MG capsule Take 1 capsule (300 mg total) by mouth 3 (three) times daily. 04/24/17 05/08/17  Dhruti Ghuman, Ambrose Finland, MD  geriatric multivitamins-minerals (ELDERTONIC/GEVRABON) ELIX Take 15 mLs by mouth 2 (two) times daily.    [provider]  hydrALAZINE (APRESOLINE) 50 MG tablet Take 50 mg by mouth 4 (four) times daily.     [provider]  lisinopril (PRINIVIL,ZESTRIL) 20 MG tablet Take 1 tablet (20 mg total) by mouth 2 (two) times daily. 07/06/16   Marvel Plan, MD  loratadine (CLARITIN) 10 MG tablet Take 10 mg by mouth daily as needed for allergies.    [provider]  metFORMIN (GLUCOPHAGE) 500 MG tablet Take 500 mg by mouth daily.     [provider]  nitroGLYCERIN (NITROSTAT) 0.4 MG SL tablet Place 0.4 mg under the tongue every 5 (five) minutes as needed. Chest pain    [provider]  NUTRITIONAL SUPPLEMENT LIQD Take 120 mLs by mouth 2 (two) times daily. NSA MedPass between breakfast and lunch and lunch and dinner    [provider]    Nutritional Supplements (NUTRITIONAL SUPPLEMENT PO) Take 1 each by mouth daily. Magic Cup at [employer], Historical, MD  QUEtiapine (SEROQUEL) 25 MG tablet Take 1 tablet (25 mg total) by mouth at bedtime. 02/21/15   Rinehuls, David L, PA-C  senna-docusate (SENOKOT-S) 8.6-50 MG tablet Take 1 tablet by mouth 2 (two) times daily. 07/02/16   Layne Benton, NP  tamsulosin (FLOMAX) 0.4 MG CAPS capsule Take 0.4 mg by mouth daily.     [provider]    Family History Family History  Problem Relation Age of Onset  . Anxiety disorder Mother   . Cancer Mother     Social History Social History  Substance Use Topics  . Smoking status: Former Games developer  . Smokeless tobacco: Never Used  . Alcohol use No     Allergies   Patient has no known allergies.   Review of Systems Review of Systems  Unable to perform ROS: Dementia  Neurological: Positive for seizures.  Physical Exam Updated Vital Signs BP (!) 172/93   Pulse 64   Resp 20   Ht 5\' 11"  (1.803 m)   Wt 84.8 kg (187 lb)   SpO2 96%   BMI 26.08 kg/m   Physical Exam  Constitutional: He appears well-developed and well-nourished. No distress.  HENT:  Head: Normocephalic and atraumatic.  Moist mucous membranes  Eyes: Pupils are equal, round, and reactive to light. Conjunctivae and EOM are normal.  Strabismus left eye  Neck:  In C-collar  Cardiovascular: Normal rate, regular rhythm and normal heart sounds.   No murmur heard. Pulmonary/Chest: Effort normal and breath sounds normal. He exhibits no tenderness.  Abdominal: Soft. Bowel sounds are normal. He exhibits no distension. There is no tenderness.  Musculoskeletal: He exhibits no edema, tenderness or deformity.  No midline spinal tenderness  Neurological: He is alert.  Oriented to person only, confused but following commands, moving all 4 extremities, Fluent speech  Skin: Skin is warm and dry.  Nursing note and vitals reviewed.    ED Treatments / Results   Labs (all labs ordered are listed, but only abnormal results are displayed) Labs Reviewed  COMPREHENSIVE METABOLIC PANEL - Abnormal; Notable for the following:       Result Value   Glucose, Bld 127 (*)    ALT 13 (*)    GFR calc non Af Amer 55 (*)    All other components within normal limits  CBC - Abnormal; Notable for the following:    RBC 4.12 (*)    Hemoglobin 12.3 (*)    HCT 36.9 (*)    Platelets 144 (*)    All other components within normal limits  URINALYSIS, ROUTINE W REFLEX MICROSCOPIC - Abnormal; Notable for the following:    Protein, ur 100 (*)    All other components within normal limits  URINALYSIS, MICROSCOPIC (REFLEX) - Abnormal; Notable for the following:    Bacteria, UA RARE (*)    Squamous Epithelial / LPF 0-5 (*)    All other components within normal limits    EKG  EKG Interpretation  Date/Time:  Sunday April 24 2017 08:38:14 EDT Ventricular Rate:  71 PR Interval:    QRS Duration: 89 QT Interval:  373 QTC Calculation: 406 R Axis:   52 Text Interpretation:  Sinus rhythm Consider left ventricular hypertrophy Anterior Q waves, possibly due to LVH Nonspecific T abnormalities, lateral leads No significant change since last tracing Confirmed by Frederick Peers [phone]) on 04/24/2017 9:07:24 AM       Radiology Dg Chest 2 View  Result Date: 04/24/2017 CLINICAL DATA:  Found on bathroom floor by wife after he had a seizure and fall ; history hypertension, coronary artery disease post MI, diabetes mellitus, stroke, dementia, alcohol abuse, atrial flutter, former smoker EXAM: CHEST  2 VIEW COMPARISON:  01/09/2016 FINDINGS: Normal heart size, mediastinal contours, and pulmonary vascularity. Atherosclerotic calcification aorta. Lungs clear. No pleural effusion or pneumothorax. No acute osseous findings. IMPRESSION: No acute abnormalities. Electronically Signed   By: Ulyses Southward M.D.   On: 04/24/2017 09:22   Ct Head Wo Contrast  Result Date: 04/24/2017 CLINICAL DATA:   Found on bathroom floor this morning RIGHT patient's wife after she occurred and fall, seizure, history dementia, hypertension, coronary artery disease post MI, diabetes mellitus, seizure disorder, dementia, atrial flutter, stroke EXAM: CT HEAD WITHOUT CONTRAST CT CERVICAL SPINE WITHOUT CONTRAST TECHNIQUE: Multidetector CT imaging of the head and cervical spine was performed following the standard protocol without intravenous contrast. Multiplanar  CT image reconstructions of the cervical spine were also generated. COMPARISON:  CT head 02/19/2017 FINDINGS: CT HEAD FINDINGS Brain: Generalized atrophy. Stable ventricular morphology with chronic ex vacuo dilatation of the atrium and occipital horn of the LEFT lateral ventricle. No midline shift or mass effect. Small vessel chronic ischemic changes of deep cerebral white matter. Old RIGHT frontal and LEFT occipital infarcts. Question old pontine infarcts. No intracranial hemorrhage, mass lesion, or evidence acute infarction. No extra-axial fluid collections. Vascular: Atherosclerotic calcification of internal carotid arteries at skullbase. Skull: Intact Sinuses/Orbits: Nasal septal deviation to the RIGHT. Paranasal sinuses mastoid air cells clear Other: N/A CT CERVICAL SPINE FINDINGS Alignment: Normal Skull base and vertebrae: Visualized skullbase intact. Vertebral body heights maintained without fracture or bone destruction. Multilevel degenerative disc and facet disease changes. Soft tissues and spinal canal: Prevertebral soft tissues normal thickness. Disc levels: Multilevel disc space narrowing with endplate spur formation. No obvious spinal stenosis. Upper chest: Lung apices clear Other: Atherosclerotic calcifications of the carotid bifurcations bilaterally and at the cranial aspect of the aortic arch. IMPRESSION: Atrophy with small vessel chronic ischemic changes of deep cerebral white matter. Old infarcts RIGHT frontal and LEFT parietal. No acute intracranial  abnormalities. Multilevel degenerative disc and facet disease changes cervical spine. No acute cervical spine abnormalities. Aortic Atherosclerosis (ICD10-I70.0). Electronically Signed   By: Ulyses Southward M.D.   On: 04/24/2017 09:17   Ct Cervical Spine Wo Contrast  Result Date: 04/24/2017 CLINICAL DATA:  Found on bathroom floor this morning RIGHT patient's wife after she occurred and fall, seizure, history dementia, hypertension, coronary artery disease post MI, diabetes mellitus, seizure disorder, dementia, atrial flutter, stroke EXAM: CT HEAD WITHOUT CONTRAST CT CERVICAL SPINE WITHOUT CONTRAST TECHNIQUE: Multidetector CT imaging of the head and cervical spine was performed following the standard protocol without intravenous contrast. Multiplanar CT image reconstructions of the cervical spine were also generated. COMPARISON:  CT head 02/19/2017 FINDINGS: CT HEAD FINDINGS Brain: Generalized atrophy. Stable ventricular morphology with chronic ex vacuo dilatation of the atrium and occipital horn of the LEFT lateral ventricle. No midline shift or mass effect. Small vessel chronic ischemic changes of deep cerebral white matter. Old RIGHT frontal and LEFT occipital infarcts. Question old pontine infarcts. No intracranial hemorrhage, mass lesion, or evidence acute infarction. No extra-axial fluid collections. Vascular: Atherosclerotic calcification of internal carotid arteries at skullbase. Skull: Intact Sinuses/Orbits: Nasal septal deviation to the RIGHT. Paranasal sinuses mastoid air cells clear Other: N/A CT CERVICAL SPINE FINDINGS Alignment: Normal Skull base and vertebrae: Visualized skullbase intact. Vertebral body heights maintained without fracture or bone destruction. Multilevel degenerative disc and facet disease changes. Soft tissues and spinal canal: Prevertebral soft tissues normal thickness. Disc levels: Multilevel disc space narrowing with endplate spur formation. No obvious spinal stenosis. Upper chest:  Lung apices clear Other: Atherosclerotic calcifications of the carotid bifurcations bilaterally and at the cranial aspect of the aortic arch. IMPRESSION: Atrophy with small vessel chronic ischemic changes of deep cerebral white matter. Old infarcts RIGHT frontal and LEFT parietal. No acute intracranial abnormalities. Multilevel degenerative disc and facet disease changes cervical spine. No acute cervical spine abnormalities. Aortic Atherosclerosis (ICD10-I70.0). Electronically Signed   By: Ulyses Southward M.D.   On: 04/24/2017 09:17    Procedures Procedures (including critical care time)  Medications Ordered in ED Medications  gabapentin (NEURONTIN) tablet 300 mg (300 mg Oral Given 04/24/17 1212)     Initial Impression / Assessment and Plan / ED Course  I have reviewed the triage vital signs and  the nursing notes.  Pertinent labs & imaging results that were available during my care of the patient were reviewed by me and considered in my medical decision making (see chart for details).     PT w/ h/o ICH thought to be related to HTN, CAA p/w seizure at home. I reviewed his chart which shows visit w/ neurologist in May, during which time they were going to start lamictal w/ titration but deferred by wife who wanted to wait for prescription from [place] [facility]. On arrival, he was awake, confused but comfortable and in NAD. VS stable. No external signs of trauma, no complaints of pain. Obtained above labs and imaging.    Labwork overall reassuring with no evidence of infection or dehydration. Chest x-ray negative acute. CT of head and C-spine negative for acute injury. I attempted to contact [facility] and their call back line instructed me to contact our neuro hospitalist. Discussed with Dr. Amada Jupiter, appreciate assistance. He recommended instead of lamictal, which will take a while to be therapeutic, the patient can be started on higher dose of gabapentin for seizure treatment. Gave Rx for 300mg   TID and discussed with family this plan. Emphasized the importance of follow-up with his outpatient neurologist as well as his PCP. Wife and caregiver voiced understanding of treatment plan, return precautions, and follow-up and patient was discharged in satisfactory condition.  Final Clinical Impressions(s) / ED Diagnoses   Final diagnoses:  Seizure [facility])    New Prescriptions Discharge Medication List as of 04/24/2017 12:45 PM       Sharyn Brilliant, Ambrose Finland, MD 04/24/17 [phone]

## 2017-04-24 NOTE — Discharge Instructions (Signed)
CONTACT DR. Roda ShuttersXU AT [facility] FOR FOLLOW UP APPOINTMENT.  START TAKING GABAPENTIN (NEURONTIN) 300MG  THREE TIMES DAILY FOR SEIZURES.   RETURN TO ER IF HE CONTINUES TO HAVE SEIZURES AT HOME.

## 2017-04-24 NOTE — ED Notes (Signed)
Pt on cardiac monitor and auto VS. Seizure pads remain in place.

## 2017-04-24 NOTE — ED Notes (Signed)
ED Provider at bedside. 

## 2017-04-24 NOTE — ED Triage Notes (Signed)
[facility] reports that the patient was at home in bed.  Got up to go to the bathroom, wife hear him fall, and was found on the bathroom floor.  Post ethical on arrival, IV access #20g left hand, saline lock.  CBG 120.

## 2017-04-24 NOTE — ED Notes (Signed)
Pt taken to [facility] via [facility].

## 2017-04-24 NOTE — ED Notes (Addendum)
Pt on cardiac monitor and auto VS. Pt in hospital gown, seizure pads in place.

## 2017-05-24 ENCOUNTER — Encounter: Payer: Self-pay | Admitting: Neurology

## 2017-05-24 ENCOUNTER — Other Ambulatory Visit: Payer: Self-pay

## 2017-05-24 ENCOUNTER — Ambulatory Visit (INDEPENDENT_AMBULATORY_CARE_PROVIDER_SITE_OTHER): Payer: Medicare Other | Admitting: Neurology

## 2017-05-24 VITALS — BP 147/77 | HR 60 | Wt 186.8 lb

## 2017-05-24 DIAGNOSIS — E785 Hyperlipidemia, unspecified: Secondary | ICD-10-CM | POA: Diagnosis not present

## 2017-05-24 DIAGNOSIS — F0151 Vascular dementia with behavioral disturbance: Secondary | ICD-10-CM

## 2017-05-24 DIAGNOSIS — R569 Unspecified convulsions: Secondary | ICD-10-CM

## 2017-05-24 DIAGNOSIS — F01518 Vascular dementia, unspecified severity, with other behavioral disturbance: Secondary | ICD-10-CM

## 2017-05-24 DIAGNOSIS — R4189 Other symptoms and signs involving cognitive functions and awareness: Secondary | ICD-10-CM | POA: Diagnosis not present

## 2017-05-24 DIAGNOSIS — I680 Cerebral amyloid angiopathy: Secondary | ICD-10-CM

## 2017-05-24 DIAGNOSIS — I10 Essential (primary) hypertension: Secondary | ICD-10-CM | POA: Diagnosis not present

## 2017-05-24 DIAGNOSIS — I611 Nontraumatic intracerebral hemorrhage in hemisphere, cortical: Secondary | ICD-10-CM

## 2017-05-24 MED ORDER — LAMOTRIGINE 50 MG PO TBDP: ORAL_TABLET | ORAL | 0 refills | Status: DC

## 2017-05-24 NOTE — Progress Notes (Signed)
STROKE NEUROLOGY FOLLOW UP NOTE  NAME: Austin Sawyer DOB: Jul 14, 1935  REASON FOR VISIT: stroke follow up HISTORY FROM: chart and pt and wife  Today we had the pleasure of seeing Austin Sawyer in follow-up at our Neurology Clinic. Pt was accompanied by wife.   History Summary Austin Sawyer is a 82 y.o. male with history of HTN, DM, CAD, atrial flutter on Coumadin before until 2011, alcohol abuse admitted on 02/08/15 for headache, nausea vomiting, confusion, blurred vision. CT had showed left occipital ICH with small SAH, SDH and IVH. Put on 3% saline and admitted to ICU. He was then stabilized and off 3% saline. Stroke work up including MRA, CUS, 2D ehco, and EEG were negative. Repeat CT head stable. LDL 107. He was discharged to SNF after medically ready.   Follow up 05/14/15 - the patient has been doing well. He stayed in SNF until one month ago. He is currently at home with home PT/OT/speech 3 times per week. He still has right hemoanopia and will follow up with ophthalmology tomorrow. He also follows up with PCP next month. BP today 137/77. He is on 4 BP meds now.     Follow up 08/18/15 - pt has been doing well. Glucose at home is OK, around 80s. BP today in clinic 125/75. Wife stated that pt seems to have significant cognitive impairment after stroke. Can not read, write, or doing math as per wife after stroke. Pt also repeat his questions during clinic visit. His right hemianopia seems somewhat better, he will see ophthal tomorrow for follow up. BP 125/75 today.  Follow up 11/2015 - pt has been doing the same. Had ophthalmology follow up and had new prescription glasses. No significant visual field changes, still has right hemianopia. Wife still reports cognitive impairment.  05/24/16 follow up - Pt has been doing well. Seems right hemianopia improved, able to see shadow of hands, but still blurry. Lost glasses that ophthalmology prescribed to him. As per wife, his memory is stable but still has a  lot of mood swings. BP 120/75.  06/24/16 admission - pt admitted to [facility] for confusion and behavior changes for one week. CT head showed right frontal ICH with midline shift in setting of hypertension due to noncompliance or CAA given recurrent lobar ICHs. EEG no seizure, EF 60-65%. BP controlled with IV and then po meds. Due to concern of CAA, no antiplatelet was recommended on discharge. He was sent to SNF with lipitor and seroquel. Had PEG placement for dysphagia. Gout was treated with colchicine and allopurinol.   11/24/16 follow up - pt has been doing well. He is more awake alert with fluent language today in clinic. However, due to baseline dementia, her speech frequently off topic with perseveration. Came in wheelchair but able to walk in NH. Currently, wife is dealing with daughter/son for pt guardianship. She has court appearance next Monday. His PEG tube was removed in 09/2016. BP still high 178/80 today and no paperwork from SNF for BP monitoring except BP 160/85 on 08/28/16. He is on 3 BP meds currently in SNF.   02/21/17 ED visit - pt was sitting in recliner, and had sudden onset eyes rolling back, LOC, shaking in all extremities, mouth foaming, lasted 3-4 min. 911 called, on arrival, pt started to wake up but still not able to communicate. However, in ED, pt was back to baseline. CT head negative for acute changes. Wife denies recent infection. UDA and UA negative. He was discharged from  ED and requested follow up urgently in clinic.  03/01/17 follow up - pt has been doing at his neuro baseline. No recurrent seizure so far. He and his wife denied any previous seizure history or any recent medication changes. He receives his medication from [place] and he [facility]. Wife would like medication to be ordered from [place].   Interval History During the interval time, pt has been doing the same. EEG diffuse slow mild, no seizure. However, he did not get lamictal from [place] since  last visit. In 04/2017, he had another GTC at home and sent to [facility] ER. Dr. Amada Jupiter started him on gabapentin 300mg  tid. He finished the medication and did not get refill from [place]. [facility] would like [place] to have further plan regarding AED use.   REVIEW OF SYSTEMS: Full 14 system review of systems performed and notable only for those listed below and in HPI above, all others are negative:  Constitutional:  Activity change, appetite change, fatigue Cardiovascular: Ear/Nose/Throat:  Skin:  Eyes:   Respiratory:   Gastroitestinal:   Genitourinary:  Hematology/Lymphatic:   Endocrine:  Musculoskeletal:  Allergy/Immunology:   Neurological:  Psychiatric:  Sleep:  Daytime sleepiness  The following represents the patient's updated allergies and side effects list: No Known Allergies  The neurologically relevant items on the patient's problem list were reviewed on today's visit.  Neurologic Examination  A problem focused neurological exam (12 or more points of the single system neurologic examination, vital signs counts as 1 point, cranial nerves count for 8 points) was performed.  Blood pressure (!) 147/77, pulse 60, weight 186 lb 12.8 oz (84.7 kg).  General - Well nourished, well developed, in no apparent distress, pleasant.  Ophthalmologic - Fundi not visualized due to noncooperation.  Cardiovascular - Regular rate and rhythm.  Mental Status -  Awake alert, orientated to place and people, but not time or situation.  Language exam showed fluent speech, but frequent off topic with perseveration. Able to follow some simple commands, but still has difficulty with complete comprehension. Naming 2 out of 5 with perseveration and not able to repeat not meaningful sentences. Concentration and attention impaired, not able to perform backward spelling or calculation. Fund of knowledge severely impaired. Delayed recall 0/3.   Cranial Nerves II - XII - II - not cooperative on exam, but able to blink  bilaterally to visual threat III, IV, VI - eye movement intact. V - Facial sensation intact bilaterally. VII - Facial movement intact bilaterally. VIII - Hearing & vestibular intact bilaterally. X - Palate elevates symmetrically. XI - Chin turning & shoulder shrug intact bilaterally. XII - Tongue protrusion intact.  Motor Strength - The patient's strength was normal in all extremities and pronator drift was absent.  Bulk was normal and fasciculations were absent.   Motor Tone - Muscle tone was assessed at the neck and appendages and was normal.  Reflexes - The patient's reflexes were 1+ in all extremities and he had no pathological reflexes.  Sensory - Light touch, temperature/pinprick were assessed and were normal.    Coordination - The patient had normal movements in the hands and feet with no ataxia or dysmetria.  Tremor was absent.  Gait and Station - not tested, in wheelchair.  Data reviewed: I personally reviewed the images and agree with the radiology interpretations.  Ct Head Wo Contrast 02/16/2015 - Stable subarachnoid hemorrhage seen overlying right parietal cortex. Stable large left occipital intraparenchymal hemorrhage is noted. with  surrounding white matter edema resulting in approximately 6 mL. of left to right midline shift. Stable left-sided subdural hematoma is noted. Stable bilateral lateral intraventricular hemorrhage is noted without ventricular dilatation.  02/10/2015 - 1. No significant interval change in size of left occipital lobe intraparenchymal hematoma with slightly increased associated vasogenic edema as compared to previous. Similar trace left-to-right shift. 2. Stable to slightly decreased conspicuity of bilateral small volume subarachnoid hemorrhage. 3. Stable intraventricular hemorrhage. No hydrocephalus. 4. Persistent left subdural hematoma measuring up to 3.5 mm in maximal diameter. Blood along the left tentorium is decreased in conspicuity. 5. No  new intracranial abnormality.  02/09/2015 IMPRESSION: 1. Slight enlargement of large left occipital lobe intra-axial hemorrhage. Estimated hemorrhage volume now 58 mm. Stable left posterior hemisphere edema and mass effect. 2. Mild progression of subarachnoid extension, now bilateral. Extension and of a left subdural space with small broad-based left subdural hematoma not significantly changed. Stable intraventricular hemorrhage volume. No ventriculomegaly. 3. No new intracranial abnormality identified.   02/08/2015 IMPRESSION: 1. 6.7 cm left occipital parenchymal hemorrhage with mild surrounding edema. No midline shift. 2. Small amount of adjacent subarachnoid hemorrhage and small amount of intraventricular extension of blood. 3. Small subdural hematomas over the left cerebral convexity and left tentorium.   Dg Chest Port 1 View 02/13/15 1. Stable support apparatus. 2. Improved basilar atelectasis. 02/10/15 Tube and catheter positions as described without pneumothorax. No edema or consolidation. 02/09/2015 IMPRESSION: No acute cardiopulmonary disease.   Carotid Doppler No evidence of significant stenosis in the right or left ICA. The left side was technically challenged due to line placement.   2D Echocardiogram Left ventricle: The cavity size was normal. Wall thickness was increased in a pattern of moderate LVH. Systolic function was normal. The estimated ejection fraction was in the range of 55% to 60%. Doppler parameters are consistent with elevated ventricular end-diastolic filling pressure. - Aortic valve: Poorly seen Moderately calcified mild stenosis by gradients but may be underestimated due to poor CW angle. Valve area (VTI): 2.11 cm^2. Valve area (Vmax): 2.01 cm^2. Valve area (Vmean): 1.88 cm^2. - Mitral valve: There was mild regurgitation. - Left atrium: The atrium was moderately dilated. - Impressions: Cannot r/o SOE due to extremely poor image  quality. Impressions: - Cannot r/o SOE due to extremely poor image quality.  MRI and MRA  7.3 x 3.3 cm left occipital hematoma unchanged from the prior CT. There is intraventricular hemorrhage and subarachnoid hemorrhage also unchanged. There is mass-effect on the left occipital horn and 3 mm midline shift to the right. No evidence of underlying tumor or vascular malformation. This may be related to cerebral amyloid angiography. Hypertension also possible. Negative MRA circle Willis.  EEG 06/25/16 This EEG is abnormal with moderately severe generalized continuous nonspecific slowing of cerebral activity. No evidence of epileptic activity was recorded. The absence of epileptiform activity during an EEG recording does not, in and of itself, rule out seizure disorder, however.  EKG NSR  Ct Head Wo Contrast 02/19/2017 IMPRESSION: No acute intracranial process. RIGHT frontal lobe encephalomalacia at site of prior hemorrhage. Old LEFT PCA territory infarct. Moderate to severe chronic small vessel ischemic disease.    04/24/17 Atrophy with small vessel chronic ischemic changes of deep cerebral white matter Old infarcts RIGHT frontal and LEFT parietal. No acute intracranial abnormalities.  Component     Latest Ref Rng 02/09/2015  Cholesterol     0 - 200 mg/dL 373  Triglycerides     <150 mg/dL 47  HDL Cholesterol     >  40 mg/dL 62  Total CHOL/HDL Ratio      2.9  VLDL     0 - 40 mg/dL 9  LDL (calc)     0 - 99 mg/dL 540 (H)  Hemoglobin J8J     4.8 - 5.6 % 6.0 (H)  Mean Plasma Glucose      126  TSH     0.350 - 4.500 uIU/mL 1.154   07/19/16 A1C 6.4, AST 54 and ALT 113  Assessment: As you may recall, he is a 82 y.o. African American male with PMH of HTN, DM, CAD, atrial flutter on Coumadin before until 2010, alcohol abuse admitted on 02/08/15 for left occipital ICH with SAH, SDH and IVH. Recovered well with no need of surgical intervention. Stroke work up largely negative except LDL 107.  Etiology still most likely HTN but CAA not able to completely ruled out. Pt later was sent to SNF and then home. Still has significant language deficit and hemianopia. BP stable on 4 BP meds. Repeat CT showed resolution of bleed and will put on ASA 81mg . Has hx of afib on coumadin but in 2011 Dr. Antoine Poche considered his Aflutter was transient and cured, and he was then off coumadin. Pt also has significant cognitive impairment, and MMSE 6/30. More consistent with vascular dementia. On 06/24/16, he was admitted for right frontal ICH in setting of hypertension due to noncompliance or CAA given recurrent lobar ICHs. EEG no seizure, EF 60-65%. BP controlled with IV and then po meds. Due to concern of CAA, no antiplatelet was recommended on discharge. He was sent to SNF with lipitor and seroquel. PEG later removed in SNF. BP much better controlled. Now back to home, however, had new onset seizure GTC on 02/21/17. Repeat EEG showed mild diffuse slowing and no seizure. Plan to start lamictal for seizure control but wife would like [facility]. However, for some reason, pt was not started on lamictal. He had another GTC in 04/2017 and was started on gabapentin 300mg  tid but ran out of meds. Now this time, we really needs to start lamictal. I will give him one month supply from [facility] and [place] needs to be contacted to continue the medication and titration after one month.   Plan:  - continue lipitor for stroke prevention - avoid antiplatelet due to concerns of CAA - check BP and glucose at home and record  - no driving due to dementia, seizure and vision changes - Start with 25mg  twice a day for 2 weeks, and then 50mg  twice a day for 2 weeks, then 75mg  twice a day for 2 weeks and then 100mg  twice a day afterward.  - will prescribe one month meds to your pharmacy and also will fax the office note to Dr. Jeri Lager to continue prescribe lamictal and follow up with titration schedule.  - Follow up with your [facility]  physician for stroke risk factor modification and seizure control. Recommend maintain blood pressure goal <130/80, diabetes with hemoglobin A1c goal below 6.5% and lipids with LDL cholesterol goal below 70 mg/dL.  - continue seroquel for behavior disturbance.  - follow up in 3 months.   I spent more than 25 minutes of face to face time with the patient. Greater than 50% of time was spent in counseling and coordination of care. We have discussed about seizure precautions, seizure medication to start in [place], and no antiplatelet therapy.    No orders of the defined types were placed in this encounter.  Meds ordered this encounter  Medications  . DISCONTD: LamoTRIgine 50 MG TBDP    Sig: Start with 25mg  bid for 2 weeks, and then 50mg  bid for 2 weeks, then 75mg  bid for 2 weeks and then 100mg  bid afterward.    Dispense:  42 tablet    Refill:  0    Pt will get one month supply from pharmacy but would like his [facility] doctor Dr. Jeri Lager to continue prescribe after one month. Thanks.    Patient Instructions  - continue lipitor for stroke prevention - avoid antiplatelet due to concerns of CAA - check BP and glucose at home and record  - no driving due to dementia, seizure and vision changes - Start with 25mg  twice a day for 2 weeks, and then 50mg  twice a day for 2 weeks, then 75mg  twice a day for 2 weeks and then 100mg  twice a day afterward.  - will prescribe one month meds to your pharmacy and also will fax the office note to Dr. Jeri Lager and let her continue prescribe lamictal and follow up with titration schedule.  - Follow up with your [facility] physician for stroke risk factor modification and seizure control. Recommend maintain blood pressure goal <130/80, diabetes with hemoglobin A1c goal below 6.5% and lipids with LDL cholesterol goal below 70 mg/dL.  - continue seroquel for behavior disturbance.  - follow up in 3 months.    Marvel Plan, MD PhD [facility] [address] [place], [place] [address] [phone]

## 2017-05-24 NOTE — Patient Instructions (Addendum)
-   continue lipitor for stroke prevention - avoid antiplatelet due to concerns of CAA - check BP and glucose at home and record  - no driving due to dementia, seizure and vision changes - Start with 25mg  twice a day for 2 weeks, and then 50mg  twice a day for 2 weeks, then 75mg  twice a day for 2 weeks and then 100mg  twice a day afterward.  - will prescribe one month meds to your pharmacy and also will fax the office note to Dr. Jeri Lager and let her continue prescribe lamictal and follow up with titration schedule.  - Follow up with your [facility] physician for stroke risk factor modification and seizure control. Recommend maintain blood pressure goal <130/80, diabetes with hemoglobin A1c goal below 6.5% and lipids with LDL cholesterol goal below 70 mg/dL.  - continue seroquel for behavior disturbance.  - follow up in 3 months.

## 2017-08-30 ENCOUNTER — Ambulatory Visit (INDEPENDENT_AMBULATORY_CARE_PROVIDER_SITE_OTHER): Payer: Medicare Other | Admitting: Neurology

## 2017-08-30 ENCOUNTER — Encounter (INDEPENDENT_AMBULATORY_CARE_PROVIDER_SITE_OTHER): Payer: Self-pay

## 2017-08-30 ENCOUNTER — Encounter: Payer: Self-pay | Admitting: Neurology

## 2017-08-30 VITALS — BP 129/75 | HR 55 | Wt 191.0 lb

## 2017-08-30 DIAGNOSIS — I10 Essential (primary) hypertension: Secondary | ICD-10-CM

## 2017-08-30 DIAGNOSIS — F01518 Vascular dementia, unspecified severity, with other behavioral disturbance: Secondary | ICD-10-CM

## 2017-08-30 DIAGNOSIS — R569 Unspecified convulsions: Secondary | ICD-10-CM | POA: Diagnosis not present

## 2017-08-30 DIAGNOSIS — I611 Nontraumatic intracerebral hemorrhage in hemisphere, cortical: Secondary | ICD-10-CM | POA: Diagnosis not present

## 2017-08-30 DIAGNOSIS — F0151 Vascular dementia with behavioral disturbance: Secondary | ICD-10-CM | POA: Diagnosis not present

## 2017-08-30 DIAGNOSIS — I680 Cerebral amyloid angiopathy: Secondary | ICD-10-CM | POA: Diagnosis not present

## 2017-08-30 NOTE — Patient Instructions (Addendum)
-   continue lipitor for stroke prevention - avoid antiplatelet due to concerns of CAA - check BP and glucose at home and record  - no driving due to dementia, seizure and vision changes - continue lamictal 100mg  twice a day for seizure control  - continue gabapentin for neuropathy, which is also good for seizure control.   - Follow up with your [facility] physician for stroke risk factor modification and seizure control. Recommend maintain blood pressure goal <140/80, diabetes with hemoglobin A1c goal below 6.5% and lipids with LDL cholesterol goal below 70 mg/dL.  - continue seroquel for sun downing.  - follow up in 6 months.

## 2017-08-30 NOTE — Progress Notes (Signed)
STROKE NEUROLOGY FOLLOW UP NOTE  NAME: Jerson Furukawa DOB: 1935-09-11  REASON FOR VISIT: stroke follow up HISTORY FROM: chart and pt and wife  Today we had the pleasure of seeing Jemaine Prokop in follow-up at our Neurology Clinic. Pt was accompanied by wife and caregiver.   History Summary Mr. Theadore Blunck is a 82 y.o. male with history of HTN, DM, CAD, atrial flutter on Coumadin before until 2011, alcohol abuse admitted on 02/08/15 for headache, nausea vomiting, confusion, blurred vision. CT had showed left occipital ICH with small SAH, SDH and IVH. Put on 3% saline and admitted to ICU. He was then stabilized and off 3% saline. Stroke work up including MRA, CUS, 2D ehco, and EEG were negative. Repeat CT head stable. LDL 107. He was discharged to SNF after medically ready.   Follow up 05/14/15 - the patient has been doing well. He stayed in SNF until one month ago. He is currently at home with home PT/OT/speech 3 times per week. He still has right hemoanopia and will follow up with ophthalmology tomorrow. He also follows up with PCP next month. BP today 137/77. He is on 4 BP meds now.     Follow up 08/18/15 - pt has been doing well. Glucose at home is OK, around 80s. BP today in clinic 125/75. Wife stated that pt seems to have significant cognitive impairment after stroke. Can not read, write, or doing math as per wife after stroke. Pt also repeat his questions during clinic visit. His right hemianopia seems somewhat better, he will see ophthal tomorrow for follow up. BP 125/75 today.  Follow up 11/2015 - pt has been doing the same. Had ophthalmology follow up and had new prescription glasses. No significant visual field changes, still has right hemianopia. Wife still reports cognitive impairment.  05/24/16 follow up - Pt has been doing well. Seems right hemianopia improved, able to see shadow of hands, but still blurry. Lost glasses that ophthalmology prescribed to him. As per wife, his memory is stable  but still has a lot of mood swings. BP 120/75.  06/24/16 admission - pt admitted to [facility] for confusion and behavior changes for one week. CT head showed right frontal ICH with midline shift in setting of hypertension due to noncompliance or CAA given recurrent lobar ICHs. EEG no seizure, EF 60-65%. BP controlled with IV and then po meds. Due to concern of CAA, no antiplatelet was recommended on discharge. He was sent to SNF with lipitor and seroquel. Had PEG placement for dysphagia. Gout was treated with colchicine and allopurinol.   11/24/16 follow up - pt has been doing well. He is more awake alert with fluent language today in clinic. However, due to baseline dementia, her speech frequently off topic with perseveration. Came in wheelchair but able to walk in NH. Currently, wife is dealing with daughter/son for pt guardianship. She has court appearance next Monday. His PEG tube was removed in 09/2016. BP still high 178/80 today and no paperwork from SNF for BP monitoring except BP 160/85 on 08/28/16. He is on 3 BP meds currently in SNF.   02/21/17 ED visit - pt was sitting in recliner, and had sudden onset eyes rolling back, LOC, shaking in all extremities, mouth foaming, lasted 3-4 min. 911 called, on arrival, pt started to wake up but still not able to communicate. However, in ED, pt was back to baseline. CT head negative for acute changes. Wife denies recent infection. UDA and UA negative. He was  discharged from ED and requested follow up urgently in clinic.  03/01/17 follow up - pt has been doing at his neuro baseline. No recurrent seizure so far. He and his wife denied any previous seizure history or any recent medication changes. He receives his medication from [facility] and he has appointment with his [facility] PCP in 03/08/17. Wife would like medication to be ordered from [facility].   05/24/17 follow up - pt has been doing the same. EEG diffuse slow mild, no seizure. However, he did not get lamictal from [facility] since last visit.  In 04/2017, he had another GTC at home and [facility] ER. Dr. Amada JupiterKirkpatrick started him on gabapentin 300mg  tid. He finished the medication and did not get refill from [facility]. [facility] would like us to have further plan regarding AED use.   Interval History During the interval time, pt has been doing well except 06/2017 had one episode of mild seizure. EMS called but no need to go to ER. He was still on titrating phase of lamical at that time. Now his lamictal is at 100mg  bid. He was also started gabapentin 300mg  tid for neuropathy by his PCP. No more seizure episode since. He is still sleepy during the day as per wife, no change for a long time. He goes to walk with caregiver daily at outside house. He complains of HA, once a week on average, not long lasting, responding to tylenol. BP during the HA was normal. Today BP 129/75.   REVIEW OF SYSTEMS: Full 14 system review of systems performed and notable only for those listed below and in HPI above, all others are negative:  Constitutional:  Activity change, appetite change, fatigue Cardiovascular: Ear/Nose/Throat: hearing loss, runny nose Skin:  Eyes:  Eye discharges Respiratory:   Gastroitestinal:   Genitourinary: frequency of urination Hematology/Lymphatic:   Endocrine:  Musculoskeletal:  Allergy/Immunology:   Neurological: memory loss, HA, seizure Psychiatric: agitation, behavior problem, confusion, depression Sleep:  Daytime sleepiness  The following represents the patient's updated allergies and side effects list: No Known Allergies  The neurologically relevant items on the patient's problem list were reviewed on today's visit.  Neurologic Examination  A problem focused neurological exam (12 or more points of the single system neurologic examination, vital signs counts as 1 point, cranial nerves count for 8 points) was performed.  Blood pressure 129/75, pulse (!) 55, weight 191 lb (86.6 kg).  General - Well nourished, well developed, in no  apparent distress, pleasant.  Ophthalmologic - Fundi not visualized due to noncooperation.  Cardiovascular - Regular rate and rhythm.  Mental Status -  Awake alert, orientated to place and people, but not time or situation.  Language exam showed fluent speech, but frequent off topic with perseveration. Able to follow some simple commands, but still has difficulty with complete comprehension. Naming 2 out of 5 with perseveration and not able to repeat not meaningful sentences. Concentration and attention impaired, not able to perform backward spelling or calculation. Fund of knowledge severely impaired. Delayed recall 0/3.   Cranial Nerves II - XII - II - not cooperative on exam, but able to blink bilaterally to visual threat III, IV, VI - eye movement intact. V - Facial sensation intact bilaterally. VII - Facial movement intact bilaterally. VIII - Hearing & vestibular intact bilaterally. X - Palate elevates symmetrically. XI - Chin turning & shoulder shrug intact bilaterally. XII - Tongue protrusion intact.  Motor Strength - The patient's strength was normal in all extremities and pronator drift  was absent.  Bulk was normal and fasciculations were absent.   Motor Tone - Muscle tone was assessed at the neck and appendages and was normal.  Reflexes - The patient's reflexes were 1+ in all extremities and he had no pathological reflexes.  Sensory - Light touch, temperature/pinprick were assessed and were normal.    Coordination - The patient had normal movements in the hands and feet with no ataxia or dysmetria.  Tremor was absent.  Gait and Station - walk with cane, slow, cautions gait but steady.  Data reviewed: I personally reviewed the images and agree with the radiology interpretations.  Ct Head Wo Contrast 02/16/2015 - Stable subarachnoid hemorrhage seen overlying right parietal cortex. Stable large left occipital intraparenchymal hemorrhage is noted. with surrounding white  matter edema resulting in approximately 6 mL. of left to right midline shift. Stable left-sided subdural hematoma is noted. Stable bilateral lateral intraventricular hemorrhage is noted without ventricular dilatation.  02/10/2015 - 1. No significant interval change in size of left occipital lobe intraparenchymal hematoma with slightly increased associated vasogenic edema as compared to previous. Similar trace left-to-right shift. 2. Stable to slightly decreased conspicuity of bilateral small volume subarachnoid hemorrhage. 3. Stable intraventricular hemorrhage. No hydrocephalus. 4. Persistent left subdural hematoma measuring up to 3.5 mm in maximal diameter. Blood along the left tentorium is decreased in conspicuity. 5. No new intracranial abnormality.  02/09/2015 IMPRESSION: 1. Slight enlargement of large left occipital lobe intra-axial hemorrhage. Estimated hemorrhage volume now 58 mm. Stable left posterior hemisphere edema and mass effect. 2. Mild progression of subarachnoid extension, now bilateral. Extension and of a left subdural space with small broad-based left subdural hematoma not significantly changed. Stable intraventricular hemorrhage volume. No ventriculomegaly. 3. No new intracranial abnormality identified.   02/08/2015 IMPRESSION: 1. 6.7 cm left occipital parenchymal hemorrhage with mild surrounding edema. No midline shift. 2. Small amount of adjacent subarachnoid hemorrhage and small amount of intraventricular extension of blood. 3. Small subdural hematomas over the left cerebral convexity and left tentorium.   Dg Chest Port 1 View 02/13/15 1. Stable support apparatus. 2. Improved basilar atelectasis. 02/10/15 Tube and catheter positions as described without pneumothorax. No edema or consolidation. 02/09/2015 IMPRESSION: No acute cardiopulmonary disease.   Carotid Doppler No evidence of significant stenosis in the right or left ICA. The left side was technically challenged  due to line placement.   2D Echocardiogram Left ventricle: The cavity size was normal. Wall thickness was increased in a pattern of moderate LVH. Systolic function was normal. The estimated ejection fraction was in the range of 55% to 60%. Doppler parameters are consistent with elevated ventricular end-diastolic filling pressure. - Aortic valve: Poorly seen Moderately calcified mild stenosis by gradients but may be underestimated due to poor CW angle. Valve area (VTI): 2.11 cm^2. Valve area (Vmax): 2.01 cm^2. Valve area (Vmean): 1.88 cm^2. - Mitral valve: There was mild regurgitation. - Left atrium: The atrium was moderately dilated. - Impressions: Cannot r/o SOE due to extremely poor image quality. Impressions: - Cannot r/o SOE due to extremely poor image quality.  MRI and MRA  7.3 x 3.3 cm left occipital hematoma unchanged from the prior CT. There is intraventricular hemorrhage and subarachnoid hemorrhage also unchanged. There is mass-effect on the left occipital horn and 3 mm midline shift to the right. No evidence of underlying tumor or vascular malformation. This may be related to cerebral amyloid angiography. Hypertension also possible. Negative MRA circle Willis.  EEG 06/25/16 This EEG is abnormal with moderately severe  generalized continuous nonspecific slowing of cerebral activity. No evidence of epileptic activity was recorded. The absence of epileptiform activity during an EEG recording does not, in and of itself, rule out seizure disorder, however.  EKG NSR  Ct Head Wo Contrast 02/19/2017 IMPRESSION: No acute intracranial process. RIGHT frontal lobe encephalomalacia at site of prior hemorrhage. Old LEFT PCA territory infarct. Moderate to severe chronic small vessel ischemic disease.    04/24/17 Atrophy with small vessel chronic ischemic changes of deep cerebral white matter Old infarcts RIGHT frontal and LEFT parietal. No acute intracranial  abnormalities.  Component     Latest Ref Rng 02/09/2015  Cholesterol     0 - 200 mg/dL 161  Triglycerides     <150 mg/dL 47  HDL Cholesterol     >40 mg/dL 62  Total CHOL/HDL Ratio      2.9  VLDL     0 - 40 mg/dL 9  LDL (calc)     0 - 99 mg/dL 096 (H)  Hemoglobin E4V     4.8 - 5.6 % 6.0 (H)  Mean Plasma Glucose      126  TSH     0.350 - 4.500 uIU/mL 1.154   07/19/16 A1C 6.4, AST 54 and ALT 113  Assessment: As you may recall, he is a 82 y.o. African American male with PMH of HTN, DM, CAD, atrial flutter on Coumadin before until 2010, alcohol abuse admitted on 02/08/15 for left occipital ICH with SAH, SDH and IVH. Recovered well with no need of surgical intervention. Stroke work up largely negative except LDL 107. Etiology still most likely HTN but CAA not able to completely ruled out. Pt later was sent to SNF and then home. Still has significant language deficit and hemianopia. BP stable on 4 BP meds. Repeat CT showed resolution of bleed and will put on ASA 81mg . Has hx of afib on coumadin but in 2011 Dr. Antoine Poche considered his Aflutter was transient and cured, and he was then off coumadin. Pt also has significant cognitive impairment, and MMSE 6/30. More consistent with vascular dementia. On 06/24/16, he was admitted for right frontal ICH in setting of hypertension due to noncompliance or CAA given recurrent lobar ICHs. EEG no seizure, EF 60-65%. BP controlled with IV and then po meds. Due to concern of CAA, no antiplatelet was recommended on discharge. He was sent to SNF with lipitor and seroquel. PEG later removed in SNF. BP much better controlled. Now back to home, however, had new onset seizure GTC on 02/21/17. Repeat EEG showed mild diffuse slowing and no seizure. Plan to start lamictal for seizure control but wife would like [facility] to prescribe the meds. However, for some reason, pt was not started on lamictal. He had another GTC in 04/2017 and was started on gabapentin 300mg  tid but ran out of  meds. Start lamictal with titrating dose in 05/2017. Had a mild seizure in 06/2017 during the titrating phase. Now at lamictal 100mg  bid and gabapentin 300mg  tid. No seizure since. Infrequent HA responding to tylenol. No significant behavior issue, on seroquel.   Plan:  - continue lipitor for stroke prevention - avoid antiplatelet due to concerns of CAA - check BP and glucose at home and record  - no driving due to dementia, seizure and vision changes - continue lamictal 100mg  twice a day for seizure control  - continue gabapentin for neuropathy, which is also good for seizure control.   - Follow up with your [facility] physician for stroke  risk factor modification and seizure control. Recommend maintain blood pressure goal <140/80, diabetes with hemoglobin A1c goal below 6.5% and lipids with LDL cholesterol goal below 70 mg/dL.  - continue seroquel for sun downing.  - follow up in 6 months.   I spent more than 25 minutes of face to face time with the patient. Greater than 50% of time was spent in counseling and coordination of care. We have discussed about seizure medication, no driving, and no antiplatelet therapy.    No orders of the defined types were placed in this encounter.   No orders of the defined types were placed in this encounter.   Patient Instructions  - continue lipitor for stroke prevention - avoid antiplatelet due to concerns of CAA - check BP and glucose at home and record  - no driving due to dementia, seizure and vision changes - continue lamictal 100mg  twice a day for seizure control  - continue gabapentin for neuropathy, which is also good for seizure control.   - Follow up with your [facility] physician for stroke risk factor modification and seizure control. Recommend maintain blood pressure goal <140/80, diabetes with hemoglobin A1c goal below 6.5% and lipids with LDL cholesterol goal below 70 mg/dL.  - continue seroquel for sun downing.  - follow up in 6 months.     Marvel Plan, MD PhD [facility] [address] [place], [place] [address] [phone]

## 2017-09-04 DIAGNOSIS — R569 Unspecified convulsions: Secondary | ICD-10-CM | POA: Diagnosis not present

## 2017-12-02 IMAGING — CT CT HEAD W/O CM
3 of 8 series · 13 of 47 positions shown, 15 images · non-contrast
Comparison: CT head 02/19/2017

CLINICAL DATA: Found on bathroom floor this morning RIGHT patient's
wife after she occurred and fall, seizure, history dementia,
hypertension, coronary artery disease post MI, diabetes mellitus,
seizure disorder, dementia, atrial flutter, stroke

EXAM:
CT HEAD WITHOUT CONTRAST
CT CERVICAL SPINE WITHOUT CONTRAST
TECHNIQUE: Multidetector CT imaging of the head and cervical spine was
performed following the standard protocol without intravenous
contrast. Multiplanar CT image reconstructions of the cervical spine
were also generated.

[Series 3: head 2.0 h70h · axial · 0.47mm/px · z∈[-169,-141]mm · 2 of 82 slices shown]
[im 14/82  brain]
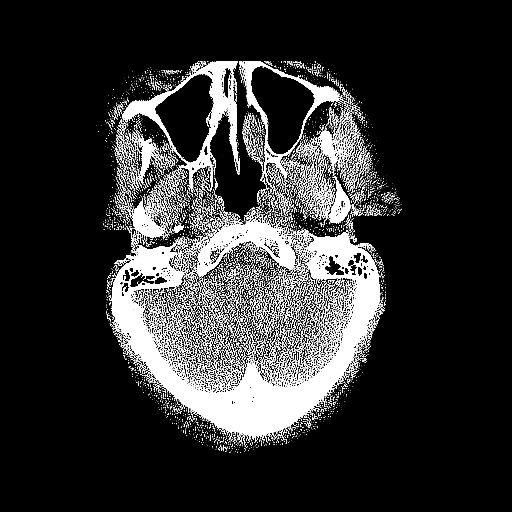
[im 28/82  brain]
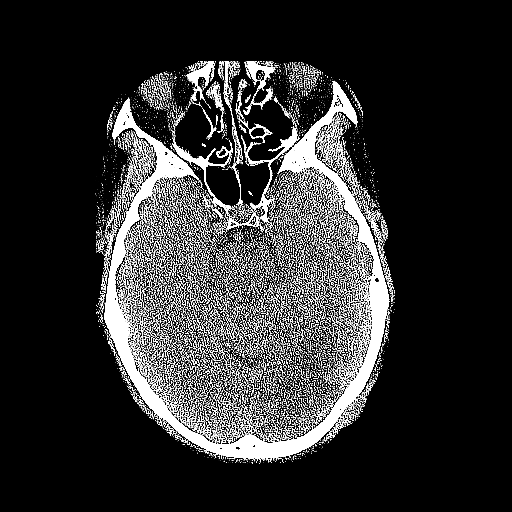

[Series 4: head 3.0 mpr cor · coronal · 0.32mm/px · 3 of 72 slices shown]
[im 16/72  brain]
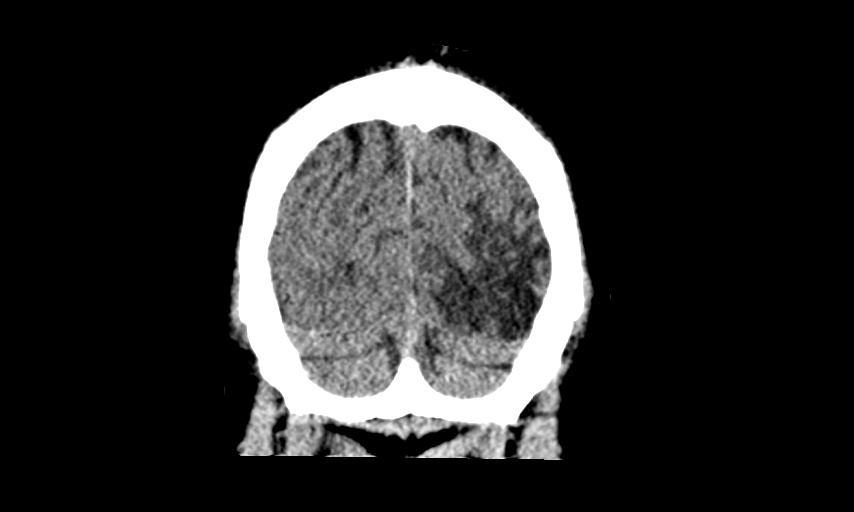
[im 21/72  brain]
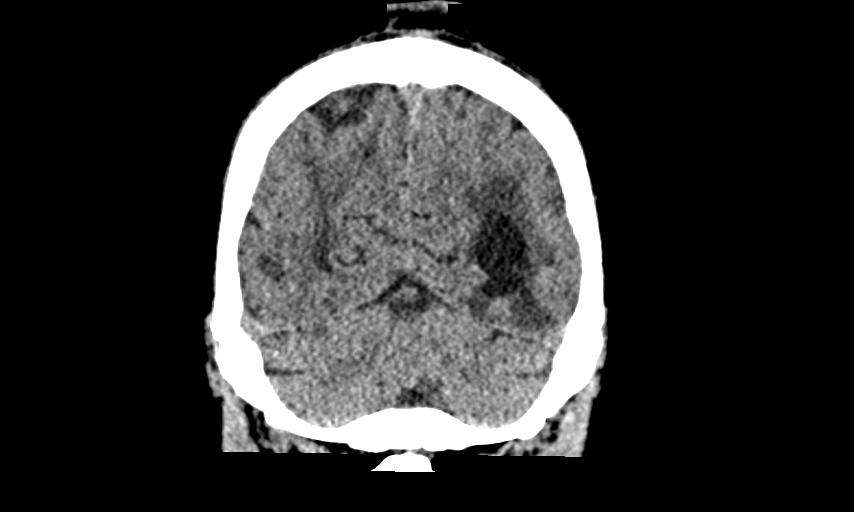
[im 26/72  brain]
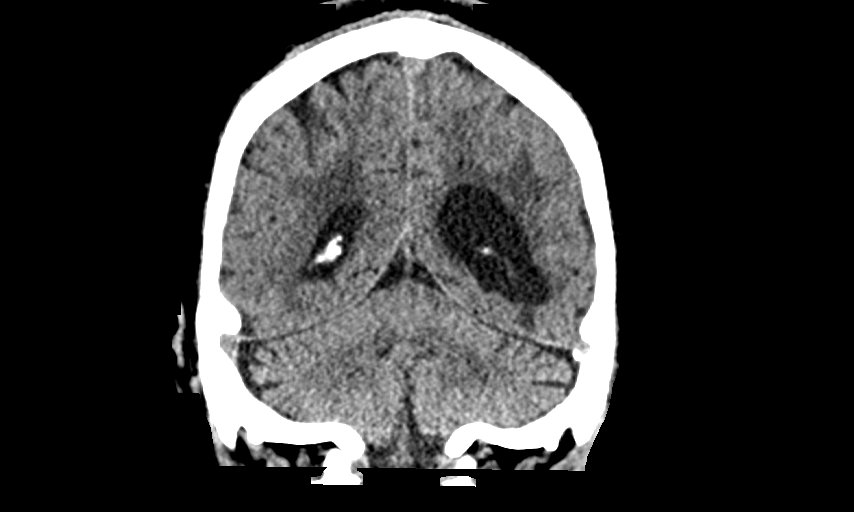

[Series 11: orthogonals · axial · 0.27mm/px · z∈[-350,-176]mm · 8 of 117 slices shown, 10 images]
[im 13/117  brain]
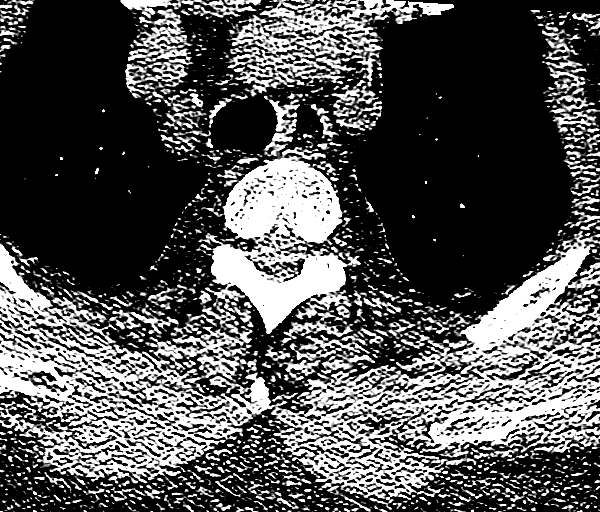
[im 13/117  bone]
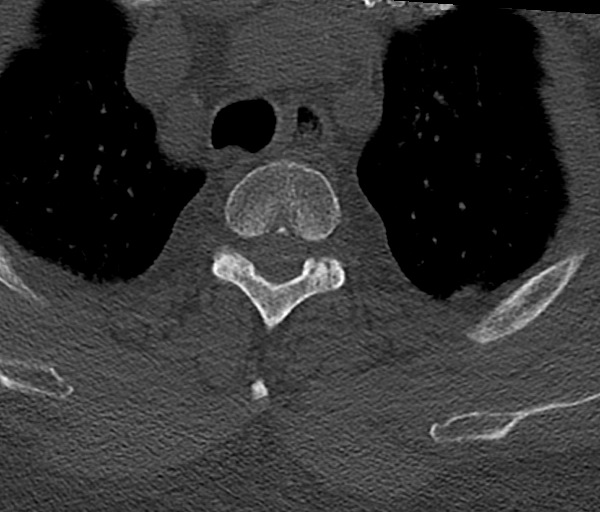
[im 26/117  brain]
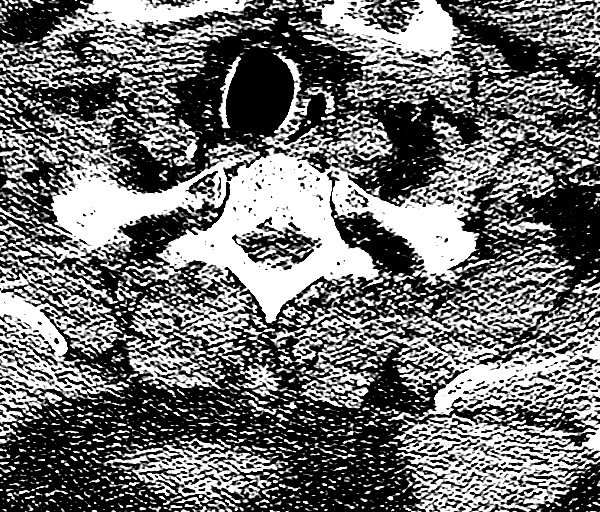
[im 39/117  brain]
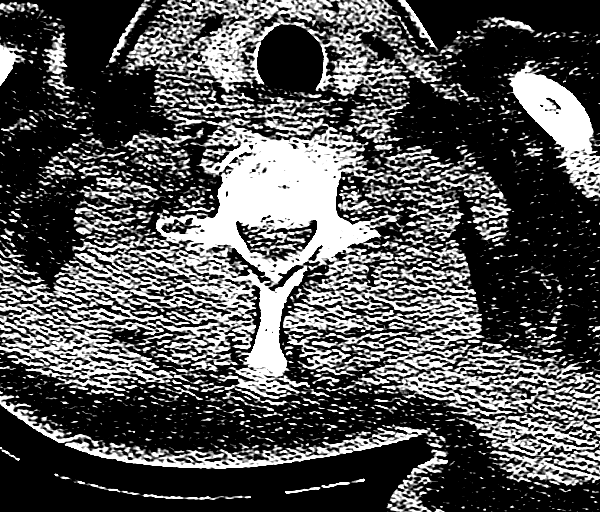
[im 52/117  brain]
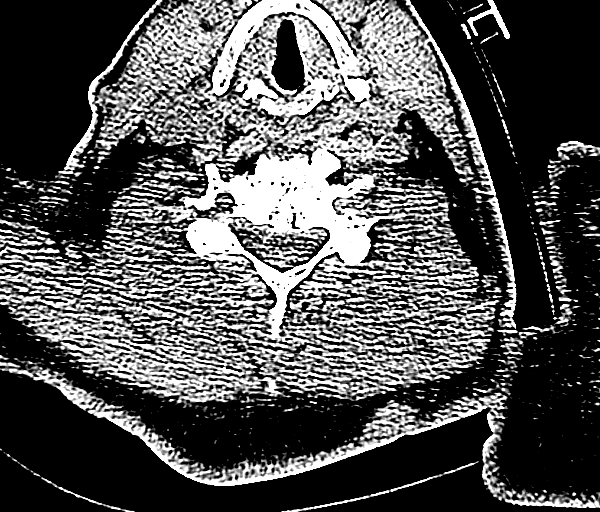
[im 65/117  brain]
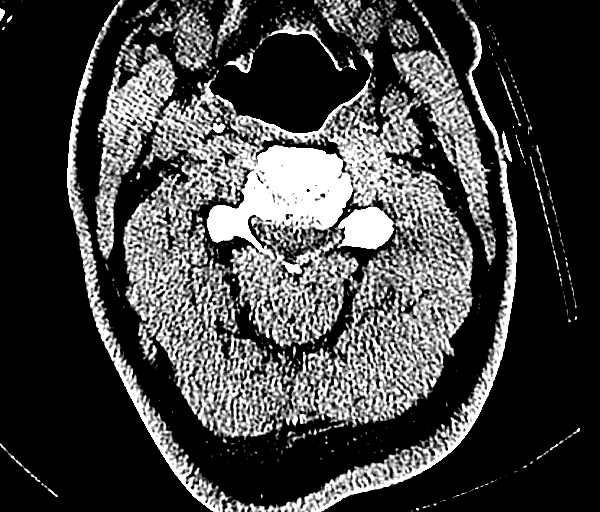
[im 65/117  bone]
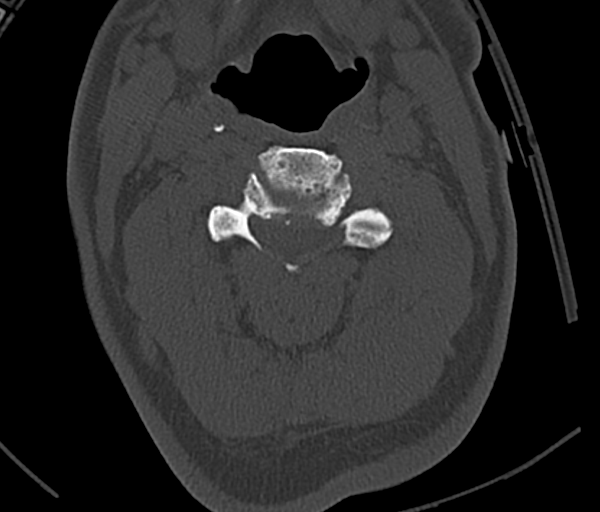
[im 78/117  brain]
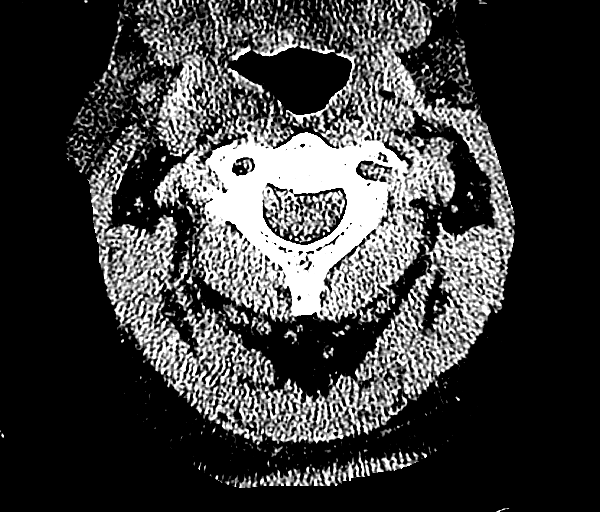
[im 91/117  brain]
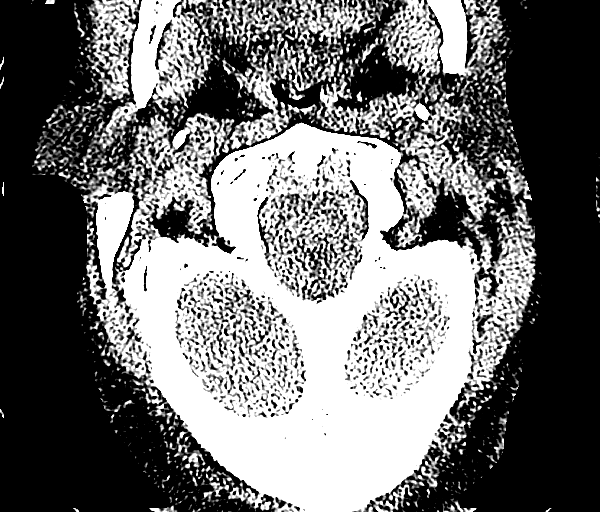
[im 104/117  brain]
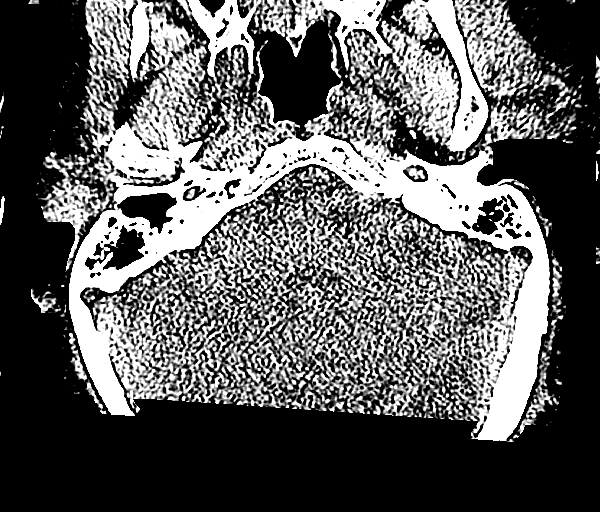

[13 of 47 positions shown; findings below may reference images not displayed]

FINDINGS: CT HEAD FINDINGS

Brain: Generalized atrophy. Stable ventricular morphology with
chronic ex vacuo dilatation of the atrium and occipital horn of the
LEFT lateral ventricle. No midline shift or mass effect. Small
vessel chronic ischemic changes of deep cerebral white matter. Old
RIGHT frontal and LEFT occipital infarcts. Question old pontine
infarcts. No intracranial hemorrhage, mass lesion, or evidence acute
infarction. No extra-axial fluid collections.

Vascular: Atherosclerotic calcification of internal carotid arteries
at skullbase.

Skull: Intact

Sinuses/Orbits: Nasal septal deviation to the RIGHT. Paranasal
sinuses mastoid air cells clear

Other: N/A

CT CERVICAL SPINE FINDINGS

Alignment: Normal

Skull base and vertebrae: Visualized skullbase intact. Vertebral
body heights maintained without fracture or bone destruction.
Multilevel degenerative disc and facet disease changes.

Soft tissues and spinal canal: Prevertebral soft tissues normal
thickness.

Disc levels: Multilevel disc space narrowing with endplate spur
formation. No obvious spinal stenosis.

Upper chest: Lung apices clear

Other: Atherosclerotic calcifications of the carotid bifurcations
bilaterally and at the cranial aspect of the aortic arch.
IMPRESSION: Atrophy with small vessel chronic ischemic changes of deep cerebral
white matter.

Old infarcts RIGHT frontal and LEFT parietal.

No acute intracranial abnormalities.

Multilevel degenerative disc and facet disease changes cervical
spine.

No acute cervical spine abnormalities.

Aortic Atherosclerosis ([id number]).

## 2018-02-28 ENCOUNTER — Telehealth: Payer: Self-pay | Admitting: Neurology

## 2018-02-28 ENCOUNTER — Ambulatory Visit (INDEPENDENT_AMBULATORY_CARE_PROVIDER_SITE_OTHER): Payer: Medicare Other | Admitting: Neurology

## 2018-02-28 ENCOUNTER — Encounter: Payer: Self-pay | Admitting: Neurology

## 2018-02-28 VITALS — BP 154/76 | HR 63 | Wt 198.6 lb

## 2018-02-28 DIAGNOSIS — F01518 Vascular dementia, unspecified severity, with other behavioral disturbance: Secondary | ICD-10-CM

## 2018-02-28 DIAGNOSIS — I611 Nontraumatic intracerebral hemorrhage in hemisphere, cortical: Secondary | ICD-10-CM

## 2018-02-28 DIAGNOSIS — F0151 Vascular dementia with behavioral disturbance: Secondary | ICD-10-CM | POA: Diagnosis not present

## 2018-02-28 DIAGNOSIS — R569 Unspecified convulsions: Secondary | ICD-10-CM

## 2018-02-28 DIAGNOSIS — I680 Cerebral amyloid angiopathy: Secondary | ICD-10-CM

## 2018-02-28 DIAGNOSIS — I10 Essential (primary) hypertension: Secondary | ICD-10-CM

## 2018-02-28 NOTE — Telephone Encounter (Signed)
Seems that pt is on lamictal  bid. Since he had two seizure like activities for the last 3 months, we would like to increase his lamictal dose.   Please left pt caregiver and wife know that please contact his [facility] provider and arrange for increase lamictal dose. Please take  bid for 2 weeks and then  bid after for seizure control. Pt getting medications from [place]. Thanks.   Marvel Plan, MD PhD Stroke Neurology 02/28/2018 8:53 PM

## 2018-02-28 NOTE — Telephone Encounter (Signed)
Pts caregiver called stating that the pt is taking LamoTRIgine 50 MG TBDP 200 MG daily. One  in the morning and another at night.

## 2018-02-28 NOTE — Patient Instructions (Addendum)
-   continue lipitor for stroke prevention - avoid antiplatelet due to concerns of CAA - check BP at home and record. If on the low side, please call [facility] PCP to adjust medications.  - check glucose at home and record  - no driving due to dementia, seizure and vision changes - continue lamictal  twice a day for seizure control. Please check the lamictal dose and let us know. If continue to have seizure episodes, we need to increase the lamictal dose.  - continue gabapentin for now, if too drowsy, we may consider to decrease the dose   - Follow up with your [facility] physician for stroke risk factor modification and seizure control. Recommend maintain blood pressure goal <140/80, diabetes with hemoglobin A1c goal below 6.5% and lipids with LDL cholesterol goal below 70 mg/dL.  - continue seroquel for sun downing. Keep active during the day.  - follow up in 6 months with Shanda Bumps.

## 2018-02-28 NOTE — Progress Notes (Signed)
STROKE NEUROLOGY FOLLOW UP NOTE  NAME: Austin Sawyer DOB: 1935-09-11  REASON FOR VISIT: stroke follow up HISTORY FROM: chart and pt and wife  Today we had the pleasure of seeing Austin Sawyer in follow-up at our Neurology Clinic. Pt was accompanied by wife and caregiver.   History Summary Austin Sawyer is a 83 y.o. male with history of HTN, DM, CAD, atrial flutter on Coumadin before until 2011, alcohol abuse admitted on 02/08/15 for headache, nausea vomiting, confusion, blurred vision. CT had showed left occipital ICH with small SAH, SDH and IVH. Put on 3% saline and admitted to ICU. He was then stabilized and off 3% saline. Stroke work up including MRA, CUS, 2D ehco, and EEG were negative. Repeat CT head stable. LDL 107. He was discharged to SNF after medically ready.   Follow up 05/14/15 - the patient has been doing well. He stayed in SNF until one month ago. He is currently at home with home PT/OT/speech 3 times per week. He still has right hemoanopia and will follow up with ophthalmology tomorrow. He also follows up with PCP next month. BP today 137/77. He is on 4 BP meds now.     Follow up 08/18/15 - pt has been doing well. Glucose at home is OK, around 80s. BP today in clinic 125/75. Wife stated that pt seems to have significant cognitive impairment after stroke. Can not read, write, or doing math as per wife after stroke. Pt also repeat his questions during clinic visit. His right hemianopia seems somewhat better, he will see ophthal tomorrow for follow up. BP 125/75 today.  Follow up 11/2015 - pt has been doing the same. Had ophthalmology follow up and had new prescription glasses. No significant visual field changes, still has right hemianopia. Wife still reports cognitive impairment.  05/24/16 follow up - Pt has been doing well. Seems right hemianopia improved, able to see shadow of hands, but still blurry. Lost glasses that ophthalmology prescribed to him. As per wife, his memory is stable  but still has a lot of mood swings. BP 120/75.  06/24/16 admission - pt admitted to [facility] for confusion and behavior changes for one week. CT head showed right frontal ICH with midline shift in setting of hypertension due to noncompliance or CAA given recurrent lobar ICHs. EEG no seizure, EF 60-65%. BP controlled with IV and then po meds. Due to concern of CAA, no antiplatelet was recommended on discharge. He was sent to SNF with lipitor and seroquel. Had PEG placement for dysphagia. Gout was treated with colchicine and allopurinol.   11/24/16 follow up - pt has been doing well. He is more awake alert with fluent language today in clinic. However, due to baseline dementia, her speech frequently off topic with perseveration. Came in wheelchair but able to walk in NH. Currently, wife is dealing with daughter/son for pt guardianship. She has court appearance next Monday. His PEG tube was removed in 09/2016. BP still high 178/80 today and no paperwork from SNF for BP monitoring except BP 160/85 on 08/28/16. He is on 3 BP meds currently in SNF.   02/21/17 ED visit - pt was sitting in recliner, and had sudden onset eyes rolling back, LOC, shaking in all extremities, mouth foaming, lasted 3-4 min. 911 called, on arrival, pt started to wake up but still not able to communicate. However, in ED, pt was back to baseline. CT head negative for acute changes. Wife denies recent infection. UDA and UA negative. He was  discharged from ED and requested follow up urgently in clinic.  03/01/17 follow up - pt has been doing at his neuro baseline. No recurrent seizure so far. He and his wife denied any previous seizure history or any recent medication changes. He receives his medication from [place] and he [facility]. Wife would like medication to be ordered from [place].   05/24/17 follow up - pt has been doing the same. EEG diffuse slow mild, no seizure. However, he did not get lamictal from [place] since last visit.  In 04/2017, he had another GTC at home and sent to [facility] ER. Dr. Amada Jupiter started him on gabapentin  tid. He finished the medication and did not get refill from [place]. [facility] would like [place] to have further plan regarding AED use.   08/30/17 follow up - pt has been doing well except 06/2017 had one episode of mild seizure. EMS called but no need to go to ER. He was still on titrating phase of lamical at that time. Now his lamictal is at  bid. He was also started gabapentin  tid for neuropathy by his PCP. No more seizure episode since. He is still sleepy during the day as per wife, no change for a long time. He goes to walk with caregiver daily at outside house. He complains of HA, once a week on average, not long lasting, responding to tylenol. BP during the HA was normal. Today BP 129/75.   Interval History During the interval time, pt had 2 episodes concerning for seizure. One was 3 months ago, pt was in standing position, wife noticed that pt became glazed eyes and staring off, people helped him sitting down, it lasted about 3-4 min. No shaking or jerking. Another one was last Friday, he was standing in bathroom brushing his teeth, had both hand shaking, eyes rolling back, mouth smacking, and LOC. Family laid him down and called EMS. Episode lasted 3-4 min, and on EMS arrival his BP 200s/100s. No obvious post ictal after. Today his BP 154/76 but wife said his BP at home 100s/70s. He takes lamictal and gabapentin at home, but wife and caregiver not sure the dosage of lamictal at this time.   Family and caregiver also complain that patient sleeps a lot at home during the day for the last 2-3 months. He has good sleep at night but still sleepy during the day. On gabapentin  tid and seroquel  Qhs but seems have been on for a while. Family does not want to change this time.    REVIEW OF SYSTEMS: Full 14 system review of systems performed and notable only for those listed below and in HPI above,  all others are negative:  Constitutional:   Cardiovascular: Ear/Nose/Throat:  Skin: moles Eyes:   Respiratory:   Gastroitestinal:   Genitourinary:  Hematology/Lymphatic:   Endocrine:  Musculoskeletal:  Allergy/Immunology:   Neurological: seizure Psychiatric: confusion Sleep:  Acting out dreams  The following represents the patient's updated allergies and side effects list: No Known Allergies  The neurologically relevant items on the patient's problem list were reviewed on today's visit.  Neurologic Examination  A problem focused neurological exam (12 or more points of the single system neurologic examination, vital signs counts as 1 point, cranial nerves count for 8 points) was performed.  Blood pressure (!) 154/76, pulse 63, weight 198 lb 9.6 oz (90.1 kg).  General - Well nourished, well developed, in no apparent distress, pleasant.  Ophthalmologic - Fundi  not visualized due to noncooperation.  Cardiovascular - Regular rate and rhythm.  Mental Status -  Awake alert, orientated to place and people, but not time or situation.  Language exam showed fluent speech, but frequent off topic with perseveration. Able to follow some simple commands, but still has difficulty with complete comprehension. Naming 2 out of 5 with perseveration and not able to repeat not meaningful sentences. Concentration and attention impaired, not able to perform backward spelling or calculation. Fund of knowledge severely impaired. Delayed recall 0/3.   Cranial Nerves II - XII - II - not cooperative on exam, but able to blink bilaterally to visual threat III, IV, VI - eye movement intact. V - Facial sensation intact bilaterally. VII - Facial movement intact bilaterally. VIII - Hearing loss on the right. X - Palate elevates symmetrically. XI - Chin turning & shoulder shrug intact bilaterally. XII - Tongue protrusion intact.  Motor Strength - The patient's strength was normal in all extremities and  pronator drift was absent.  Bulk was normal and fasciculations were absent.   Motor Tone - Muscle tone was assessed at the neck and appendages and was normal.  Reflexes - The patient's reflexes were 1+ in all extremities and he had no pathological reflexes.  Sensory - Light touch, temperature/pinprick were assessed and were normal.    Coordination - The patient had normal movements in the hands and feet with no ataxia or dysmetria.  Tremor was absent.  Gait and Station - walk with cane, slow, cautions gait but steady.  Data reviewed: I personally reviewed the images and agree with the radiology interpretations.  Ct Head Wo Contrast 02/16/2015 - Stable subarachnoid hemorrhage seen overlying right parietal cortex. Stable large left occipital intraparenchymal hemorrhage is noted. with surrounding white matter edema resulting in approximately 6 mL. of left to right midline shift. Stable left-sided subdural hematoma is noted. Stable bilateral lateral intraventricular hemorrhage is noted without ventricular dilatation.  02/10/2015 - 1. No significant interval change in size of left occipital lobe intraparenchymal hematoma with slightly increased associated vasogenic edema as compared to previous. Similar trace left-to-right shift. 2. Stable to slightly decreased conspicuity of bilateral small volume subarachnoid hemorrhage. 3. Stable intraventricular hemorrhage. No hydrocephalus. 4. Persistent left subdural hematoma measuring up to 3.5 mm in maximal diameter. Blood along the left tentorium is decreased in conspicuity. 5. No new intracranial abnormality.  02/09/2015 IMPRESSION: 1. Slight enlargement of large left occipital lobe intra-axial hemorrhage. Estimated hemorrhage volume now 58 mm. Stable left posterior hemisphere edema and mass effect. 2. Mild progression of subarachnoid extension, now bilateral. Extension and of a left subdural space with small broad-based left subdural hematoma not  significantly changed. Stable intraventricular hemorrhage volume. No ventriculomegaly. 3. No new intracranial abnormality identified.   02/08/2015 IMPRESSION: 1. 6.7 cm left occipital parenchymal hemorrhage with mild surrounding edema. No midline shift. 2. Small amount of adjacent subarachnoid hemorrhage and small amount of intraventricular extension of blood. 3. Small subdural hematomas over the left cerebral convexity and left tentorium.   Dg Chest Port 1 View 02/13/15 1. Stable support apparatus. 2. Improved basilar atelectasis. 02/10/15 Tube and catheter positions as described without pneumothorax. No edema or consolidation. 02/09/2015 IMPRESSION: No acute cardiopulmonary disease.   Carotid Doppler No evidence of significant stenosis in the right or left ICA. The left side was technically challenged due to line placement.   2D Echocardiogram Left ventricle: The cavity size was normal. Wall thickness was increased in a pattern of moderate LVH. Systolic function  was normal. The estimated ejection fraction was in the range of 55% to 60%. Doppler parameters are consistent with elevated ventricular end-diastolic filling pressure. - Aortic valve: Poorly seen Moderately calcified mild stenosis by gradients but may be underestimated due to poor CW angle. Valve area (VTI): 2.11 cm^2. Valve area (Vmax): 2.01 cm^2. Valve area (Vmean): 1.88 cm^2. - Mitral valve: There was mild regurgitation. - Left atrium: The atrium was moderately dilated. - Impressions: Cannot r/o SOE due to extremely poor image quality. Impressions: - Cannot r/o SOE due to extremely poor image quality.  MRI and MRA  7.3 x 3.3 cm left occipital hematoma unchanged from the prior CT. There is intraventricular hemorrhage and subarachnoid hemorrhage also unchanged. There is mass-effect on the left occipital horn and 3 mm midline shift to the right. No evidence of underlying tumor or vascular malformation.  This may be related to cerebral amyloid angiography. Hypertension also possible. Negative MRA circle Willis.  EEG 06/25/16 This EEG is abnormal with moderately severe generalized continuous nonspecific slowing of cerebral activity. No evidence of epileptic activity was recorded. The absence of epileptiform activity during an EEG recording does not, in and of itself, rule out seizure disorder, however.  EKG NSR  Ct Head Wo Contrast 02/19/2017 IMPRESSION: No acute intracranial process. RIGHT frontal lobe encephalomalacia at site of prior hemorrhage. Old LEFT PCA territory infarct. Moderate to severe chronic small vessel ischemic disease.    04/24/17 Atrophy with small vessel chronic ischemic changes of deep cerebral white matter Old infarcts RIGHT frontal and LEFT parietal. No acute intracranial abnormalities.  Component     Latest Ref Rng 02/09/2015  Cholesterol     0 - 200 mg/dL 161  Triglycerides     <150 mg/dL 47  HDL Cholesterol     >40 mg/dL 62  Total CHOL/HDL Ratio      2.9  VLDL     0 - 40 mg/dL 9  LDL (calc)     0 - 99 mg/dL 096 (H)  Hemoglobin E4V     4.8 - 5.6 % 6.0 (H)  Mean Plasma Glucose      126  TSH     0.350 - 4.500 uIU/mL 1.154   07/19/16 A1C 6.4, AST 54 and ALT 113  Assessment: As you may recall, he is a 83 y.o. African American male with PMH of HTN, DM, CAD, atrial flutter on Coumadin before until 2010, alcohol abuse admitted on 02/08/15 for left occipital ICH with SAH, SDH and IVH. Recovered well with no need of surgical intervention. Stroke work up largely negative except LDL 107. Etiology still most likely HTN but CAA not able to completely ruled out. Pt later was sent to SNF and then home. Still has significant language deficit and hemianopia. BP stable on 4 BP meds. Repeat CT showed resolution of bleed and will put on ASA . Has hx of afib on coumadin but in 2011 Dr. Antoine Poche considered his Aflutter was transient and cured, and he was then off coumadin. Pt  also has significant cognitive impairment, and MMSE 6/30. More consistent with vascular dementia. On 06/24/16, he was admitted for right frontal ICH in setting of hypertension due to noncompliance or CAA given recurrent lobar ICHs. EEG no seizure, EF 60-65%. BP controlled with IV and then po meds. Due to concern of CAA, no antiplatelet was recommended on discharge. He was sent to SNF with lipitor and seroquel. PEG later removed in SNF. BP much better controlled. Now back to home, however, had  new onset seizure GTC on 02/21/17. Repeat EEG showed mild diffuse slowing and no seizure. Plan to start lamictal for seizure control but wife would like [facility] to [facility]. However, for some reason, pt was not started on lamictal. He had another GTC in 04/2017 and was started on gabapentin 300mg  tid but ran out of meds. Start lamictal with titrating dose in 05/2017. Had a mild seizure in 06/2017 during the titrating of lamictal. Family again reported seizure like activity 11/2017 and 02/2018, DDx including syncope. Family not sure about exact lamictal dose today, will check back with me. Also on gabapentin 300mg  tid.   Plan:  - continue lipitor for stroke prevention - avoid antiplatelet due to concerns of CAA - check BP at home and record. If on the low side, please call [facility] PCP to adjust medications.  - check glucose at home and record  - no driving due to dementia, seizure and vision changes - family to check the lamictal dose and let us know. Will consider to titrate the lamictal dose to 150mg  bid.  - continue gabapentin for now, if too drowsy, we may consider to decrease the dose   - Follow up with your [facility] physician for stroke risk factor modification and seizure control. Recommend maintain blood pressure goal <140/80, diabetes with hemoglobin A1c goal below 6.5% and lipids with LDL cholesterol goal below 70 mg/dL.  - continue seroquel for sun downing. Keep active during the day.  - follow up in 6 months with  Shanda Bumps.   I spent more than 25 minutes of face to face time with the patient. Greater than 50% of time was spent in counseling and coordination of care. We have discussed about lamictal dose change, keep active, no driving, and no antiplatelet therapy.    No orders of the defined types were placed in this encounter.   No orders of the defined types were placed in this encounter.   Patient Instructions  - continue lipitor for stroke prevention - avoid antiplatelet due to concerns of CAA - check BP at home and record. If on the low side, please call [facility] PCP to adjust medications.  - check glucose at home and record  - no driving due to dementia, seizure and vision changes - continue lamictal 100mg  twice a day for seizure control. Please check the lamictal dose and let us know. If continue to have seizure episodes, we need to increase the lamictal dose.  - continue gabapentin for now, if too drowsy, we may consider to decrease the dose   - Follow up with your [facility] physician for stroke risk factor modification and seizure control. Recommend maintain blood pressure goal <140/80, diabetes with hemoglobin A1c goal below 6.5% and lipids with LDL cholesterol goal below 70 mg/dL.  - continue seroquel for sun downing. Keep active during the day.  - follow up in 6 months with Shanda Bumps.    Marvel Plan, MD PhD [facility] [address], [address] [place], [place] [address] [phone]

## 2018-03-01 ENCOUNTER — Encounter: Payer: Self-pay | Admitting: Neurology

## 2018-03-01 NOTE — Telephone Encounter (Signed)
Rn call patients wife that Dr Roda Shutters recommend increasing the lamictal. He wants the [place] provider to increase dosage. Rn stated Dr. Roda Shutters recommend 125 bid for 2 weeks,an 150 bid ongoing. Rn stated a letter will be sent to the pts home address. Rn stated once receive pts [facility] needs a copy of [facility] to make the necessary adjustments. THe wife verbalized understanding. THe wife verbalized understanding.

## 2018-03-01 NOTE — Telephone Encounter (Signed)
Letter mail to patient regarding increase lamictal for [facility] MD.

## 2018-03-01 NOTE — Telephone Encounter (Signed)
Letter done. Please mail to the pt. Thanks.   Marvel Plan, MD PhD Stroke Neurology 03/01/2018 5:15 PM

## 2018-03-08 ENCOUNTER — Telehealth: Payer: Self-pay | Admitting: Neurology

## 2018-03-08 NOTE — Telephone Encounter (Signed)
Rn call and [facility] being elevated. RN stated this is per Dr. Roda ShuttersXu last note. The wife verbalized understanding.

## 2018-03-08 NOTE — Telephone Encounter (Signed)
Pts caregiver called stating hat Dr. Roda ShuttersXu wanted her to keep an eye on the pts BP she wanting to touch base with RN stating his BP has been high.

## 2018-03-30 ENCOUNTER — Emergency Department (HOSPITAL_BASED_OUTPATIENT_CLINIC_OR_DEPARTMENT_OTHER): Payer: Medicare Other

## 2018-03-30 ENCOUNTER — Emergency Department (HOSPITAL_BASED_OUTPATIENT_CLINIC_OR_DEPARTMENT_OTHER)
Admission: EM | Admit: 2018-03-30 | Discharge: 2018-03-30 | Disposition: A | Discharge status: Home / Self Care | Payer: Medicare Other | Attending: Emergency Medicine | Admitting: Emergency Medicine

## 2018-03-30 ENCOUNTER — Encounter (HOSPITAL_BASED_OUTPATIENT_CLINIC_OR_DEPARTMENT_OTHER): Payer: Self-pay | Admitting: Emergency Medicine

## 2018-03-30 ENCOUNTER — Other Ambulatory Visit: Payer: Self-pay

## 2018-03-30 DIAGNOSIS — F0390 Unspecified dementia without behavioral disturbance: Secondary | ICD-10-CM | POA: Insufficient documentation

## 2018-03-30 DIAGNOSIS — Z8673 Personal history of transient ischemic attack (TIA), and cerebral infarction without residual deficits: Secondary | ICD-10-CM | POA: Insufficient documentation

## 2018-03-30 DIAGNOSIS — H6123 Impacted cerumen, bilateral: Secondary | ICD-10-CM | POA: Diagnosis not present

## 2018-03-30 DIAGNOSIS — Z7984 Long term (current) use of oral hypoglycemic drugs: Secondary | ICD-10-CM | POA: Insufficient documentation

## 2018-03-30 DIAGNOSIS — I110 Hypertensive heart disease with heart failure: Secondary | ICD-10-CM | POA: Insufficient documentation

## 2018-03-30 DIAGNOSIS — Z79899 Other long term (current) drug therapy: Secondary | ICD-10-CM | POA: Diagnosis not present

## 2018-03-30 DIAGNOSIS — I2510 Atherosclerotic heart disease of native coronary artery without angina pectoris: Secondary | ICD-10-CM | POA: Diagnosis not present

## 2018-03-30 DIAGNOSIS — R079 Chest pain, unspecified: Secondary | ICD-10-CM | POA: Diagnosis not present

## 2018-03-30 DIAGNOSIS — R569 Unspecified convulsions: Secondary | ICD-10-CM | POA: Insufficient documentation

## 2018-03-30 DIAGNOSIS — I5022 Chronic systolic (congestive) heart failure: Secondary | ICD-10-CM | POA: Insufficient documentation

## 2018-03-30 DIAGNOSIS — G40909 Epilepsy, unspecified, not intractable, without status epilepticus: Secondary | ICD-10-CM | POA: Diagnosis not present

## 2018-03-30 DIAGNOSIS — I639 Cerebral infarction, unspecified: Secondary | ICD-10-CM | POA: Diagnosis not present

## 2018-03-30 DIAGNOSIS — E1159 Type 2 diabetes mellitus with other circulatory complications: Secondary | ICD-10-CM | POA: Diagnosis not present

## 2018-03-30 DIAGNOSIS — Z87891 Personal history of nicotine dependence: Secondary | ICD-10-CM | POA: Diagnosis not present

## 2018-03-30 LAB — CBC WITH DIFFERENTIAL/PLATELET
BASOS PCT: 0 %
Basophils Absolute: 0 10*3/uL (ref 0.0–0.1)
EOS PCT: 3 %
Eosinophils Absolute: 0.2 10*3/uL (ref 0.0–0.7)
HEMATOCRIT: 41.5 % (ref 39.0–52.0)
Hemoglobin: 13.9 g/dL (ref 13.0–17.0)
LYMPHS ABS: 0.9 10*3/uL (ref 0.7–4.0)
Lymphocytes Relative: 10 %
MCH: 30.2 pg (ref 26.0–34.0)
MCHC: 33.5 g/dL (ref 30.0–36.0)
MCV: 90 fL (ref 78.0–100.0)
Monocytes Absolute: 0.8 10*3/uL (ref 0.1–1.0)
Monocytes Relative: 8 %
NEUTROS PCT: 79 %
Neutro Abs: 7.3 10*3/uL (ref 1.7–7.7)
PLATELETS: 171 10*3/uL (ref 150–400)
RBC: 4.61 MIL/uL (ref 4.22–5.81)
RDW: 13.9 % (ref 11.5–15.5)
WBC: 9.2 10*3/uL (ref 4.0–10.5)

## 2018-03-30 LAB — URINALYSIS, ROUTINE W REFLEX MICROSCOPIC
BILIRUBIN URINE: NEGATIVE
Glucose, UA: NEGATIVE mg/dL
Hgb urine dipstick: NEGATIVE
KETONES UR: NEGATIVE mg/dL
Leukocytes, UA: NEGATIVE
NITRITE: NEGATIVE
PH: 6 (ref 5.0–8.0)
Protein, ur: NEGATIVE mg/dL
Specific Gravity, Urine: <1.005 — ABNORMAL LOW (ref 1.005–1.030)

## 2018-03-30 LAB — BASIC METABOLIC PANEL
ANION GAP: 8 (ref 5–15)
BUN: 17 mg/dL (ref 8–23)
CALCIUM: 8.9 mg/dL (ref 8.9–10.3)
CO2: 27 mmol/L (ref 22–32)
CREATININE: 1.07 mg/dL (ref 0.61–1.24)
Chloride: 107 mmol/L (ref 98–111)
GFR calc non Af Amer: >60 mL/min (ref >60)
Glucose, Bld: 97 mg/dL (ref 70–99)
Potassium: 4.5 mmol/L (ref 3.5–5.1)
SODIUM: 142 mmol/L (ref 135–145)

## 2018-03-30 LAB — TROPONIN I

## 2018-03-30 NOTE — ED Notes (Signed)
IV attempt x2 without success.

## 2018-03-30 NOTE — ED Provider Notes (Signed)
[facility] EMERGENCY DEPARTMENT Provider Note   CSN: [id number] Arrival date & time: 03/30/18  [id number]     History   Chief Complaint Chief Complaint  Patient presents with  . Seizures    HPI Austin Sawyer is a 83 y.o. male.  HPI   Patient presents today via personal vehicle for seizure. Hx significant for cerebral amyloid angiopathy, ICH, dementia, seizures, CAD. Lives with wife, has caregivers 7a-3p daily.  Wife and caregiver report that he was in his normal state of health yesterday, which includes ambulating on his own including to the bathroom, and interacting with wife and caregiver.  He has known dementia, is not able to fix his own meals or manages on medicines.  Caregiver dispenses morning meds, wife dispenses evening meds.  They state he has missed 0 doses of his seizure medicines over the past 2 weeks.  Additionally, they were able to get an increase his Lamictal dose as per recent neurology recommendations.  Has not seemed ill or to have any upper respiratory symptoms over the past few days.  No known fevers.  He has endorsed some left-sided upper chest pain over the past 2 days, he is unable to further specify alleviating or aggravating factors for this.  This morning his caregiver arrived at 7 AM and was going to get his am meds in the kitchen when she heard the wife scream. Patient had been ambulating to the bathroom on his own before this. Wife heard a bump and went into the bathroom to find him face down with generalized tonic clonic movements. This lasted about 4 minutes, seemed to be his typical seizure. He wears a depend diaper and is not normally wet, they noticed he was wet when they changed him prior to coming here.   Sees neurology for seizures, lamictal dose was increased to 125mg  BID for 2 weeks on 02/28/18 to 150mg  BID. They did pick this up and have made this adjustment. Wife states in 1.5 bills to get this dose. She says his last seizure was in May prior to  calling Neuro, before that seizures have been about every 3 months.   Past Medical History:  Diagnosis Date  . Alcohol abuse, in remission   . Atrial flutter (HCC)   . Back pain   . Cervicalgia   . Coronary artery disease   . Coronary atherosclerosis of unspecified type of vessel, native or graft   . Dementia   . Depression   . Diabetes mellitus without complication (HCC)   . Disturbance of skin sensation   . Hypertension   . MI, old   . Reflux   . Seizures (HCC)   . Stroke (HCC)   . Unspecified hearing loss   . Unspecified sleep apnea     Patient Active Problem List   Diagnosis Date Noted  . Seizures (HCC) 03/01/2017  . Cerebral amyloid angiopathy (CODE) 11/24/2016  . Nontraumatic cortical hemorrhage of right cerebral hemisphere (HCC) 11/24/2016  . Vascular dementia 11/24/2015  . Atrial flutter, unspecified 11/24/2015  . Cognitive impairment 08/19/2015  . Type 2 diabetes mellitus with other circulatory complications (HCC) 05/14/2015  . Chronic systolic congestive heart failure (HCC) 05/14/2015  . ICH (intracerebral hemorrhage) (HCC)   . HLD (hyperlipidemia)   . Agitation   . Accelerated hypertension   . Encephalopathy acute 02/17/2015  . SVT (supraventricular tachycardia) (HCC) 02/16/2015  . Cytotoxic brain edema (HCC)   . Encounter for feeding tube placement   . CHF (congestive heart failure) (  HCC)   . Atelectasis   . Acute respiratory failure with hypoxemia (HCC) 02/10/2015  . Hemorrhage of brain, nontraumatic (HCC) 02/08/2015  . Stroke due to intracerebral hemorrhage (HCC) 02/08/2015  . Cervical disc disorder with radiculopathy of cervical region 09/14/2013  . Neck pain on left side 07/12/2013  . Paresthesia of left arm 07/12/2013  . Major depressive disorder, recurrent episode, severe (HCC) 04/30/2013  . Alcohol dependence in remission (HCC) 04/22/2013  . Hard of hearing 04/22/2013  . OBESITY, UNSPECIFIED 12/09/2009  . CARDIOMYOPATHY, SECONDARY 09/03/2009  .  ATRIAL FLUTTER 09/03/2009  . Gout 03/12/2008  . HYPERGLYCEMIA 02/14/2008  . ANGINA PECTORIS 10/03/2007  . BARRETTS ESOPHAGUS 10/03/2007  . ARTHRITIS 10/03/2007  . WEIGHT LOSS 04/06/2007  . ANXIETY DEPRESSION 03/14/2007  . ABUSE, ALCOHOL, IN REMISSION 03/08/2007  . CORONARY ARTERY DISEASE 03/08/2007  . LACUNAR INFARCTION 03/08/2007  . COLONOSCOPY, HX OF 03/08/2007  . HYPOGONADISM 01/25/2007  . Hyperlipidemia 01/25/2007  . Essential hypertension 01/25/2007  . CONGESTIVE HEART FAILURE 01/25/2007  . GERD 01/25/2007  . SLEEP APNEA 01/25/2007    Past Surgical History:  Procedure Laterality Date  . HERNIA REPAIR    . IR GENERIC HISTORICAL  07/16/2016   IR REPLC GASTRO/COLONIC TUBE PERCUT W/FLUORO 07/16/2016 Irish Lack, MD [facility]  . IR GENERIC HISTORICAL  09/07/2016   IR GASTROSTOMY TUBE REMOVAL 09/07/2016 [facility]  . PEG PLACEMENT N/A 07/02/2016   Procedure: PERCUTANEOUS ENDOSCOPIC GASTROSTOMY (PEG) PLACEMENT;  Surgeon: Violeta Gelinas, MD;  Location: [facility] ENDOSCOPY;  Service: Endoscopy;  Laterality: N/A;  . ROTATOR CUFF REPAIR          Home Medications    Prior to Admission medications   Medication Sig Start Date End Date Taking? Authorizing Provider  acetaminophen (TYLENOL) 325 MG tablet Take 650 mg by mouth every 4 (four) hours as needed for mild pain.    [provider]  albuterol (PROVENTIL) (2.5 MG/3ML) 0.083% nebulizer solution Take 3 mLs (2.5 mg total) by nebulization 2 (two) times daily as needed for wheezing or shortness of breath. 02/21/15   Rinehuls, Kinnie Scales, PA-C  allopurinol (ZYLOPRIM) 300 MG tablet Take 300 mg by mouth daily. For gout    [provider]  atorvastatin (LIPITOR) 20 MG tablet Take 1 tablet (20 mg total) by mouth daily at 6 PM. 07/02/16   Layne Benton, NP  bisacodyl (DULCOLAX) 10 MG suppository Place 10 mg rectally daily as needed for moderate constipation. x2 days    [provider]  carvedilol (COREG) 12.5 MG  tablet Take 12.5 mg by mouth 2 (two) times daily with a meal. Patient takes 12.5 mg at lunch and 12.5 mg at dinner.    [provider]  gabapentin (NEURONTIN) 300 MG capsule Take 300 mg by mouth 3 (three) times daily.    [provider]  geriatric multivitamins-minerals (ELDERTONIC/GEVRABON) ELIX Take 15 mLs by mouth 2 (two) times daily.    [provider]  hydrALAZINE (APRESOLINE) 50 MG tablet Take 50 mg by mouth 4 (four) times daily.     [provider]  LamoTRIgine 50 MG TBDP Start with 25mg  bid for 2 weeks, and then 50mg  bid for 2 weeks, then 75mg  bid for 2 weeks and then 100mg  bid afterward. 05/24/17   Marvel Plan, MD  lisinopril (PRINIVIL,ZESTRIL) 20 MG tablet Take 1 tablet (20 mg total) by mouth 2 (two) times daily. 07/06/16   Marvel Plan, MD  loratadine (CLARITIN) 10 MG tablet Take 10 mg by mouth daily as  needed for allergies.    [provider]  metFORMIN (GLUCOPHAGE) 500 MG tablet Take 500 mg by mouth daily.     [provider]  nitroGLYCERIN (NITROSTAT) 0.4 MG SL tablet Place 0.4 mg under the tongue every 5 (five) minutes as needed. Chest pain    [provider]  NUTRITIONAL SUPPLEMENT LIQD Take 120 mLs by mouth 2 (two) times daily. NSA MedPass between breakfast and lunch and lunch and dinner    [provider]  Nutritional Supplements (NUTRITIONAL SUPPLEMENT PO) Take 1 each by mouth daily. Magic Cup at [employer], Historical, MD  QUEtiapine (SEROQUEL) 25 MG tablet Take 1 tablet (25 mg total) by mouth at bedtime. 02/21/15   Rinehuls, David L, PA-C  senna-docusate (SENOKOT-S) 8.6-50 MG tablet Take 1 tablet by mouth 2 (two) times daily. 07/02/16   Layne Benton, NP  tamsulosin (FLOMAX) 0.4 MG CAPS capsule Take 0.4 mg by mouth daily.     [provider]    Family History Family History  Problem Relation Age of Onset  . Anxiety disorder Mother   . Cancer Mother     Social History Social History    Tobacco Use  . Smoking status: Former Games developer  . Smokeless tobacco: Never Used  Substance Use Topics  . Alcohol use: No  . Drug use: No     Allergies   Patient has no known allergies.   Review of Systems Review of Systems  Constitutional: Negative for activity change, chills and fever.  HENT: Negative for congestion and rhinorrhea.   Respiratory: Negative for chest tightness and shortness of breath.   Cardiovascular: Positive for chest pain.  Gastrointestinal: Negative for abdominal pain, nausea and vomiting.  Genitourinary: Negative for difficulty urinating and dysuria.  Skin: Negative for rash and wound.  Neurological: Positive for seizures.     Physical Exam Updated Vital Signs BP (!) 177/77 (BP Location: Right Arm)   Pulse 61   Temp 98.5 F (36.9 C) (Oral)   Resp 14   Ht 5\' 9"  (1.753 m)   Wt 90.7 kg (200 lb)   SpO2 100%   BMI 29.53 kg/m   Physical Exam  Constitutional: He appears well-developed and well-nourished. No distress.  HENT:  Right Ear: External ear normal.  Left Ear: External ear normal.  Nose: Nose normal.  Mouth/Throat: Oropharynx is clear and moist.  Cerumen impaction bilaterally.   Eyes: Conjunctivae and EOM are normal.  Arcus senilis bilaterally.   Neck: Normal range of motion. Neck supple.  No cervical spine tenderness.   Cardiovascular: Normal rate, regular rhythm and normal heart sounds.  No murmur heard. Pulmonary/Chest: Effort normal and breath sounds normal.  Abdominal: Soft. Bowel sounds are normal. He exhibits no distension. There is no tenderness.  Musculoskeletal: He exhibits no edema or deformity.  Neurological: He is alert. No cranial nerve deficit or sensory deficit.  Skin: Skin is warm and dry. Capillary refill takes less than 2 seconds. No rash noted.  Psychiatric: He has a normal mood and affect.     ED Treatments / Results  Labs (all labs ordered are listed, but only abnormal results are displayed) Labs Reviewed   URINALYSIS, ROUTINE W REFLEX MICROSCOPIC - Abnormal; Notable for the following components:      Result Value   Specific Gravity, Urine <1.005 (*)    All other components within normal limits  BASIC METABOLIC PANEL  TROPONIN I  CBC WITH DIFFERENTIAL/PLATELET  CBC WITH DIFFERENTIAL/PLATELET  EKG EKG Interpretation  Date/Time:  Thursday March 30 2018 10:38:43 EDT Ventricular Rate:  62 PR Interval:    QRS Duration: 101 QT Interval:  420 QTC Calculation: 427 R Axis:   66 Text Interpretation:  Sinus rhythm Borderline prolonged PR interval Probable left ventricular hypertrophy Anterior Q waves, possibly due to LVH similar to previous Confirmed by Frederick Peers [phone]) on 03/30/2018 10:51:08 AM   Radiology Dg Chest 2 View  Result Date: 03/30/2018 CLINICAL DATA:  Seizure activity EXAM: CHEST - 2 VIEW COMPARISON:  04/24/2017 FINDINGS: The heart size and mediastinal contours are within normal limits. Both lungs are clear. The visualized skeletal structures are unremarkable. Aortic calcifications are noted. IMPRESSION: No active cardiopulmonary disease. Electronically Signed   By: Alcide Clever M.D.   On: 03/30/2018 11:33   Ct Head Wo Contrast  Result Date: 03/30/2018 CLINICAL DATA:  History of seizures. Alcohol abuse, in remission. Diabetes, and hypertension. Previous stroke. EXAM: CT HEAD WITHOUT CONTRAST TECHNIQUE: Contiguous axial images were obtained from the base of the skull through the vertex without intravenous contrast. COMPARISON:  04/24/2017. FINDINGS: Brain: No evidence for acute infarction, hemorrhage, mass lesion, hydrocephalus, or extra-axial fluid. Generalized atrophy. Hypoattenuation of white matter, likely small vessel disease. Large remote LEFT occipital infarct. Smaller remote RIGHT frontal infarct. Vascular: Calcification of the cavernous internal carotid arteries consistent with cerebrovascular atherosclerotic disease. No signs of intracranial large vessel occlusion. Skull:  Calvarium intact. No worrisome osseous lesion. TMJs located. Sinuses/Orbits: No layering sinus fluid. Dense lenticular opacities. Other: No middle ear or mastoid fluid. Scalp soft tissues unremarkable. IMPRESSION: Advanced chronic changes as described, stable from prior imaging 2018. No acute intracranial findings are evident. Electronically Signed   By: Elsie Stain M.D.   On: 03/30/2018 11:28    Procedures Procedures (including critical care time)  Medications Ordered in ED Medications - No data to display   Initial Impression / Assessment and Plan / ED Course  I have reviewed the triage vital signs and the nursing notes.  Pertinent labs & imaging results that were available during my care of the patient were reviewed by me and considered in my medical decision making (see chart for details).     Repeat seizure in patient with known seizures at baseline.  Unclear cause for seizure, denies missing medication or recent illness.  Concomitant chest pain which we will also work-up.  As he fell, will obtain CT head CBC BMP.  For left-sided chest pain, and history limited by dementia, will get troponin, EKG and patient with history of CAD. CXR.   Imaging without acute abnormality. UA without signs of infection. No EKG changes or elevation of trop in the setting of 2 days of CP (although history unclear given dementia). Will discharge home with wife planning to call neurology and ask about sooner follow up vs med changes. Nursing washed out bilateral cerumen impaction. Return precautions for additional seizures or changes in MS or worsening CP.     Final Clinical Impressions(s) / ED Diagnoses   Final diagnoses:  Seizure [facility])    ED Discharge Orders    None     Loni Muse, MD PGY 2 FM   Garth Bigness, MD 03/30/18 1320    Little, Ambrose Finland, MD 03/30/18 [phone]

## 2018-03-30 NOTE — Discharge Instructions (Signed)
There do not appear to be any infectious causes for your seizure today. Please call your neurologist and let them know that you were seen for another seizure and see if they want to adjust medicines. Please try tums for the chest discomfort.

## 2018-03-30 NOTE — ED Notes (Signed)
IV attempted x 1 by this nurse without success.

## 2018-03-30 NOTE — ED Triage Notes (Addendum)
Spouse and caregiver report pt had  seizure this morning.  States that he fell-unknown if he hit his head, caregiver and spouse unsure if patient is still on a blood thinner-current medicine list does not show a blood thinner listed.  Caregiver states that he had seizure for 4-5 minutes but they did not witness this until he was on the floor.  States he has been taking his seizure medication as prescribed

## 2018-04-20 NOTE — Progress Notes (Signed)
STROKE NEUROLOGY FOLLOW UP NOTE  NAME: Austin Sawyer DOB: 1935-09-11  REASON FOR VISIT: stroke follow up HISTORY FROM: chart and pt and wife  Today we had the pleasure of seeing Austin Sawyer in follow-up at our Neurology Clinic. Pt was accompanied by wife and caregiver.   History Summary Austin Sawyer is a 83 y.o. male with history of HTN, DM, CAD, atrial flutter on Coumadin before until 2011, alcohol abuse admitted on 02/08/15 for headache, nausea vomiting, confusion, blurred vision. CT had showed left occipital ICH with small SAH, SDH and IVH. Put on 3% saline and admitted to ICU. He was then stabilized and off 3% saline. Stroke work up including MRA, CUS, 2D ehco, and EEG were negative. Repeat CT head stable. LDL 107. He was discharged to SNF after medically ready.   Follow up 05/14/15 - the patient has been doing well. He stayed in SNF until one month ago. He is currently at home with home PT/OT/speech 3 times per week. He still has right hemoanopia and will follow up with ophthalmology tomorrow. He also follows up with PCP next month. BP today 137/77. He is on 4 BP meds now.     Follow up 08/18/15 - pt has been doing well. Glucose at home is OK, around 80s. BP today in clinic 125/75. Wife stated that pt seems to have significant cognitive impairment after stroke. Can not read, write, or doing math as per wife after stroke. Pt also repeat his questions during clinic visit. His right hemianopia seems somewhat better, he will see ophthal tomorrow for follow up. BP 125/75 today.  Follow up 11/2015 - pt has been doing the same. Had ophthalmology follow up and had new prescription glasses. No significant visual field changes, still has right hemianopia. Wife still reports cognitive impairment.  05/24/16 follow up - Pt has been doing well. Seems right hemianopia improved, able to see shadow of hands, but still blurry. Lost glasses that ophthalmology prescribed to him. As per wife, his memory is stable  but still has a lot of mood swings. BP 120/75.  06/24/16 admission - pt admitted to [facility] for confusion and behavior changes for one week. CT head showed right frontal ICH with midline shift in setting of hypertension due to noncompliance or CAA given recurrent lobar ICHs. EEG no seizure, EF 60-65%. BP controlled with IV and then po meds. Due to concern of CAA, no antiplatelet was recommended on discharge. He was sent to SNF with lipitor and seroquel. Had PEG placement for dysphagia. Gout was treated with colchicine and allopurinol.   11/24/16 follow up - pt has been doing well. He is more awake alert with fluent language today in clinic. However, due to baseline dementia, her speech frequently off topic with perseveration. Came in wheelchair but able to walk in NH. Currently, wife is dealing with daughter/son for pt guardianship. She has court appearance next Monday. His PEG tube was removed in 09/2016. BP still high 178/80 today and no paperwork from SNF for BP monitoring except BP 160/85 on 08/28/16. He is on 3 BP meds currently in SNF.   02/21/17 ED visit - pt was sitting in recliner, and had sudden onset eyes rolling back, LOC, shaking in all extremities, mouth foaming, lasted 3-4 min. 911 called, on arrival, pt started to wake up but still not able to communicate. However, in ED, pt was back to baseline. CT head negative for acute changes. Wife denies recent infection. UDA and UA negative. He was  discharged from ED and requested follow up urgently in clinic.  03/01/17 follow up - pt has been doing at his neuro baseline. No recurrent seizure so far. He and his wife denied any previous seizure history or any recent medication changes. He receives his medication from [place] and [facility]. Wife would like medication to be ordered from [place].   05/24/17 follow up - pt has been doing the same. EEG diffuse slow mild, no seizure. However, he did not get lamictal from [place] since last visit.  In 04/2017, he had another GTC at home and sent to [facility] ER. Dr. Amada Jupiter started him on gabapentin 300mg  tid. He finished the medication and did not get refill from [place]. [facility] would like [place] to have further plan regarding AED use.   08/30/17 follow up - pt has been doing well except 06/2017 had one episode of mild seizure. EMS called but no need to go to ER. He was still on titrating phase of lamical at that time. Now his lamictal is at 100mg  bid. He was also started gabapentin 300mg  tid for neuropathy by his PCP. No more seizure episode since. He is still sleepy during the day as per wife, no change for a long time. He goes to walk with caregiver daily at outside house. He complains of HA, once a week on average, not long lasting, responding to tylenol. BP during the HA was normal. Today BP 129/75.   02/28/18 follow up JX: During the interval time, pt had 2 episodes concerning for seizure. One was 3 months ago, pt was in standing position, wife noticed that pt became glazed eyes and staring off, people helped him sitting down, it lasted about 3-4 min. No shaking or jerking. Another one was last Friday, he was standing in bathroom brushing his teeth, had both hand shaking, eyes rolling back, mouth smacking, and LOC. Family laid him down and called EMS. Episode lasted 3-4 min, and on EMS arrival his BP 200s/100s. No obvious post ictal after. Today his BP 154/76 but wife said his BP at home 100s/70s. He takes lamictal and gabapentin at home, but wife and caregiver not sure the dosage of lamictal at this time.  Family and caregiver also complain that patient sleeps a lot at home during the day for the last 2-3 months. He has good sleep at night but still sleepy during the day. On gabapentin 300mg  tid and seroquel 25mg  Qhs but seems have been on for a while. Family does not want to change this time.    04/21/18 UPDATE: Since previous visit, patient was recently seen in the ED for seizure activity on 03/30/2018.  Patient  was in his usual state of health and was ambulating to the bathroom when his wife found him down with a generalized tonic-clonic movements that lasted approximately 4 minutes.  Lamictal dose was previously increased from 100 mg twice daily and gradually increased to 150 mg twice daily on 02/28/2018.  Per hospital notes, patient states compliance with all seizure medications.  All imaging's were negative for acute abnormality.  UA was negative for infections.  No EKG changes or elevation in tropes.  Patient is being seen today for follow-up visit and is accompanied by his wife and caregiver.  He states since recent hospital admission, he has not had seizure activity.  He was not at full dose of Lamictal when he had the seizure activity that required hospitalization but since last week, he has  been taking full dose of Lamictal 150 mg twice daily.  Patient is tolerating this well without side effects.  He does have a caregiver from Monday through Friday from 7 AM to 3 PM.  Overall patient states he feels he is doing well and has no concerns/complaints.   REVIEW OF SYSTEMS: Full 14 system review of systems performed and notable only for those listed below and in HPI above, all others are negative:  Seizure   The following represents the patient's updated allergies and side effects list: No Known Allergies  The neurologically relevant items on the patient's problem list were reviewed on today's visit.  Neurologic Examination  A problem focused neurological exam (12 or more points of the single system neurologic examination, vital signs counts as 1 point, cranial nerves count for 8 points) was performed.  Blood pressure 136/70, pulse 61, weight 201 lb (91.2 kg).  General - Well nourished, well developed, pleasant elderly African-American male, in no apparent distress, pleasant.  Ophthalmologic - Fundi not visualized due to noncooperation.  Cardiovascular - Regular rate and rhythm.  Mental Status -    Awake alert, orientated to place and people, but not time or situation.  Language exam showed fluent speech, but frequent off topic with perseveration. Able to follow some simple commands  Cranial Nerves II - XII - II - not cooperative on exam, but able to blink bilaterally to visual threat III, IV, VI - eye movement intact. V - Facial sensation intact bilaterally. VII - Facial movement intact bilaterally. VIII - Hearing loss on the right. X - Palate elevates symmetrically. XI - Chin turning & shoulder shrug intact bilaterally. XII - Tongue protrusion intact.  Motor Strength - The patient's strength was normal in all extremities and pronator drift was absent.  Bulk was normal and fasciculations were absent.   Motor Tone - Muscle tone was assessed at the neck and appendages and was normal.  Reflexes - The patient's reflexes were 1+ in all extremities and he had no pathological reflexes.  Sensory - Light touch, temperature/pinprick were assessed and were normal.    Coordination - The patient had normal movements in the hands and feet with no ataxia or dysmetria.  Tremor was absent.  Gait and Station - walk with cane, slow, cautions gait but steady.  Data reviewed: I personally reviewed the images and agree with the radiology interpretations.  Ct Head Wo Contrast 02/16/2015 - Stable subarachnoid hemorrhage seen overlying right parietal cortex. Stable large left occipital intraparenchymal hemorrhage is noted. with surrounding white matter edema resulting in approximately 6 mL. of left to right midline shift. Stable left-sided subdural hematoma is noted. Stable bilateral lateral intraventricular hemorrhage is noted without ventricular dilatation.  02/10/2015 - 1. No significant interval change in size of left occipital lobe intraparenchymal hematoma with slightly increased associated vasogenic edema as compared to previous. Similar trace left-to-right shift. 2. Stable to slightly  decreased conspicuity of bilateral small volume subarachnoid hemorrhage. 3. Stable intraventricular hemorrhage. No hydrocephalus. 4. Persistent left subdural hematoma measuring up to 3.5 mm in maximal diameter. Blood along the left tentorium is decreased in conspicuity. 5. No new intracranial abnormality.  02/09/2015 IMPRESSION: 1. Slight enlargement of large left occipital lobe intra-axial hemorrhage. Estimated hemorrhage volume now 58 mm. Stable left posterior hemisphere edema and mass effect. 2. Mild progression of subarachnoid extension, now bilateral. Extension and of a left subdural space with small broad-based left subdural hematoma not significantly changed. Stable intraventricular hemorrhage volume. No ventriculomegaly. 3. No new  intracranial abnormality identified.   02/08/2015 IMPRESSION: 1. 6.7 cm left occipital parenchymal hemorrhage with mild surrounding edema. No midline shift. 2. Small amount of adjacent subarachnoid hemorrhage and small amount of intraventricular extension of blood. 3. Small subdural hematomas over the left cerebral convexity and left tentorium.   Dg Chest Port 1 View 02/13/15 1. Stable support apparatus. 2. Improved basilar atelectasis. 02/10/15 Tube and catheter positions as described without pneumothorax. No edema or consolidation. 02/09/2015 IMPRESSION: No acute cardiopulmonary disease.   Carotid Doppler No evidence of significant stenosis in the right or left ICA. The left side was technically challenged due to line placement.   2D Echocardiogram Left ventricle: The cavity size was normal. Wall thickness was increased in a pattern of moderate LVH. Systolic function was normal. The estimated ejection fraction was in the range of 55% to 60%. Doppler parameters are consistent with elevated ventricular end-diastolic filling pressure. - Aortic valve: Poorly seen Moderately calcified mild stenosis by gradients but may be underestimated due to  poor CW angle. Valve area (VTI): 2.11 cm^2. Valve area (Vmax): 2.01 cm^2. Valve area (Vmean): 1.88 cm^2. - Mitral valve: There was mild regurgitation. - Left atrium: The atrium was moderately dilated. - Impressions: Cannot r/o SOE due to extremely poor image quality. Impressions: - Cannot r/o SOE due to extremely poor image quality.  MRI and MRA  7.3 x 3.3 cm left occipital hematoma unchanged from the prior CT. There is intraventricular hemorrhage and subarachnoid hemorrhage also unchanged. There is mass-effect on the left occipital horn and 3 mm midline shift to the right. No evidence of underlying tumor or vascular malformation. This may be related to cerebral amyloid angiography. Hypertension also possible. Negative MRA circle Willis.  EEG 06/25/16 This EEG is abnormal with moderately severe generalized continuous nonspecific slowing of cerebral activity. No evidence of epileptic activity was recorded. The absence of epileptiform activity during an EEG recording does not, in and of itself, rule out seizure disorder, however.  EKG NSR  Ct Head Wo Contrast 02/19/2017 IMPRESSION: No acute intracranial process. RIGHT frontal lobe encephalomalacia at site of prior hemorrhage. Old LEFT PCA territory infarct. Moderate to severe chronic small vessel ischemic disease.    04/24/17 Atrophy with small vessel chronic ischemic changes of deep cerebral white matter Old infarcts RIGHT frontal and LEFT parietal. No acute intracranial abnormalities.  Component     Latest Ref Rng 02/09/2015  Cholesterol     0 - 200 mg/dL 161  Triglycerides     <150 mg/dL 47  HDL Cholesterol     >40 mg/dL 62  Total CHOL/HDL Ratio      2.9  VLDL     0 - 40 mg/dL 9  LDL (calc)     0 - 99 mg/dL 096 (H)  Hemoglobin E4V     4.8 - 5.6 % 6.0 (H)  Mean Plasma Glucose      126  TSH     0.350 - 4.500 uIU/mL 1.154   07/19/16 A1C 6.4, AST 54 and ALT 113  Assessment: As you may recall, he is a 83 y.o.  African American male with PMH of HTN, DM, CAD, atrial flutter on Coumadin before until 2010, alcohol abuse admitted on 02/08/15 for left occipital ICH with SAH, SDH and IVH. Recovered well with no need of surgical intervention. Stroke work up largely negative except LDL 107. Etiology still most likely HTN but CAA not able to completely ruled out. Pt later was sent to SNF and then home. Still has  significant language deficit and hemianopia. BP stable on 4 BP meds. Repeat CT showed resolution of bleed and will put on ASA 81mg . Has hx of afib on coumadin but in 2011 Dr. Antoine Poche considered his Aflutter was transient and cured, and he was then off coumadin. Pt also has significant cognitive impairment, and MMSE 6/30. More consistent with vascular dementia. On 06/24/16, he was admitted for right frontal ICH in setting of hypertension due to noncompliance or CAA given recurrent lobar ICHs. EEG no seizure, EF 60-65%. BP controlled with IV and then po meds. Due to concern of CAA, no antiplatelet was recommended on discharge. He was sent to SNF with lipitor and seroquel. PEG later removed in SNF. BP much better controlled. Now back to home, however, had new onset seizure GTC on 02/21/17. Repeat EEG showed mild diffuse slowing and no seizure. Plan to start lamictal for seizure control but wife would like [facility]. However, for some reason, pt was not started on lamictal. He had another GTC in 04/2017 and was started on gabapentin 300mg  tid but ran out of meds. Start lamictal with titrating dose in 05/2017. Had a mild seizure in 06/2017 during the titrating of lamictal. Family again reported seizure like activity 11/2017 and 02/2018, DDx including syncope. Family not sure about exact lamictal dose today, will check back with me. Also on gabapentin 300mg  tid. Returns today for follow up appointment after recent seizure activity requiring hospitalization.   Plan:  - continue lipitor for stroke prevention - avoid  antiplatelet due to concerns of CAA - no driving due to dementia, seizure and vision changes  -Continue lamictal 150mg  BID - continue gabapentin 300mg  TID   - [facility] with your [facility] for stroke risk factor modification and seizure control. Recommend maintain blood pressure goal <140/80, diabetes with hemoglobin A1c goal below 6.5% and lipids with LDL cholesterol goal below 70 mg/dL.  - continue seroquel for sun downing. Keep active during the day.   follow up in 6 months or call earlier if needed  I spent more than 25 minutes of face to face time with the patient. Greater than 50% of time was spent in counseling and coordination of care. We have discussed about lamictal dose change, keep active, no driving, and no antiplatelet therapy.    George Hugh, AGNP-BC  [facility] [address] [place], [place] [address]  Phone [phone] Fax [phone] Note: This document was prepared with digital dictation and possible smart phrase technology. Any transcriptional errors that result from this process are unintentional.

## 2018-04-21 ENCOUNTER — Ambulatory Visit (INDEPENDENT_AMBULATORY_CARE_PROVIDER_SITE_OTHER): Payer: Medicare Other | Admitting: Adult Health

## 2018-04-21 ENCOUNTER — Encounter: Payer: Self-pay | Admitting: Adult Health

## 2018-04-21 VITALS — BP 136/70 | HR 61 | Wt 201.0 lb

## 2018-04-21 DIAGNOSIS — R569 Unspecified convulsions: Secondary | ICD-10-CM

## 2018-04-21 MED ORDER — LAMOTRIGINE 150 MG PO TABS: 150.0000 mg | ORAL_TABLET | Freq: Two times a day (BID) | ORAL | 4 refills | Status: DC

## 2018-04-21 NOTE — Patient Instructions (Addendum)
Your Plan:  Continue Lamictal 150mg  twice a day Continue gabapentin 300mg  three times a day   Follow up in 6 months or call earlier if needed      Thank you for coming to see us at [facility]. I hope we have been able to provide you high quality care today.  You may receive a patient satisfaction survey over the next few weeks. We would appreciate your feedback and comments so that we may continue to improve ourselves and the health of our patients.

## 2018-05-05 NOTE — Progress Notes (Signed)
I agree with the assessment and plan as directed by NP .   Demetric Dunnaway, MD  

## 2018-06-07 NOTE — Progress Notes (Signed)
I agree with the assessment and plan as directed by NP .The patient is followed by the stroke service .  Mekhi Lascola, MD

## 2018-08-24 ENCOUNTER — Ambulatory Visit: Payer: Medicare Other | Admitting: Adult Health

## 2018-10-23 ENCOUNTER — Encounter: Payer: Self-pay | Admitting: Adult Health

## 2018-10-23 ENCOUNTER — Ambulatory Visit (INDEPENDENT_AMBULATORY_CARE_PROVIDER_SITE_OTHER): Payer: Medicare Other | Admitting: Adult Health

## 2018-10-23 VITALS — BP 151/76 | HR 59 | Ht 69.0 in | Wt 199.6 lb

## 2018-10-23 DIAGNOSIS — F0151 Vascular dementia with behavioral disturbance: Secondary | ICD-10-CM | POA: Diagnosis not present

## 2018-10-23 DIAGNOSIS — I611 Nontraumatic intracerebral hemorrhage in hemisphere, cortical: Secondary | ICD-10-CM

## 2018-10-23 DIAGNOSIS — I10 Essential (primary) hypertension: Secondary | ICD-10-CM

## 2018-10-23 DIAGNOSIS — E785 Hyperlipidemia, unspecified: Secondary | ICD-10-CM | POA: Diagnosis not present

## 2018-10-23 DIAGNOSIS — R569 Unspecified convulsions: Secondary | ICD-10-CM

## 2018-10-23 DIAGNOSIS — E1159 Type 2 diabetes mellitus with other circulatory complications: Secondary | ICD-10-CM

## 2018-10-23 DIAGNOSIS — Z79899 Other long term (current) drug therapy: Secondary | ICD-10-CM | POA: Diagnosis not present

## 2018-10-23 DIAGNOSIS — F01518 Vascular dementia, unspecified severity, with other behavioral disturbance: Secondary | ICD-10-CM

## 2018-10-23 NOTE — Progress Notes (Signed)
I agree with the above plan 

## 2018-10-23 NOTE — Progress Notes (Signed)
STROKE NEUROLOGY FOLLOW UP NOTE  NAME: Austin Sawyer DOB: 02/25/1935  REASON FOR VISIT: stroke follow up HISTORY FROM: chart and pt and wife  Chief Complaint  Patient presents with  . Follow-up    6 month follow up. Wife and caregiver present. Rm 9. Patient's wife mentioned that he had a seizure on January 2nd, 2020.      Today we had the pleasure of seeing Austin Sawyer in follow-up at our Neurology Clinic for seizure management secondary to recurrent ICH. Pt was accompanied by wife and caregiver.   HPI:  10/23/2018 interval history: Patient is being seen today for seizure follow-up visit and medication management.  He is accompanied by his wife and caregiver.  He continues on lamotrigine 150 mg BID. Reported compliance by wife and caregiver. Wife does report recurrent seizure on 10/05/18 which lasted approx 4-5 minutes with reported shaking of his legs. No further events since prior appt and denies any recurrent seizure activity since 10/05/18.  [facility].  Wife does report blood work was obtained approximately 2 weeks ago but is unsure if lamotrigine level was obtained.  Wife does state that prior to his recent seizure activity, patient was agitated and very upset and was questioning whether this could have led to his seizure activity.  Wife does endorse worsening of his memory and cognition over the past 6 months.  He does have aide assistance for 8 hours/day and his wife cares for him in the evening and overnight.  She does endorse patient sleeps well during the night but at times during the day, can have behavioral issues with aggression, paranoia and confusion.  Continues on atorvastatin without side effects myalgias.  Blood pressure 151/76. He does use cane for ambulation while outside. He does not need for indoor.  No further concerns at this time.  History summary [name] is a 84 y.o. male with history of HTN, DM, CAD, atrial flutter  on Coumadin before until 2011, alcohol abuse admitted on 02/08/15 for headache, nausea vomiting, confusion, blurred vision. CT had showed left occipital ICH with small SAH, SDH and IVH. Put on 3% saline and admitted to ICU. He was then stabilized and off 3% saline. Stroke work up including MRA, CUS, 2D ehco, and EEG were negative. Repeat CT head stable. LDL 107. He was discharged to SNF after medically ready.   06/24/16 admission -right frontal ICH with midline shift in setting of hypertension due to noncompliance or CAA given recurrent ICHs.   02/21/17 admission-initial seizure activity: Pt was sitting in recliner, and had sudden onset eyes rolling back, LOC, shaking in all extremities, mouth foaming, lasted 3-4 min. 911 called, on arrival, pt started to wake up but still not able to communicate. However, in ED, pt was back to baseline. CT head negative for acute changes. Wife denies recent infection. UDA and UA negative. He was discharged from ED and requested follow up urgently in clinic.   05/24/17 follow up -recurrent seizure activity.  Pt has been doing the same. EEG diffuse slow mild, no seizure. However, he did not get lamictal from [facility] since last visit. In 04/2017, he had another GTC at home and sent to [facility]. Dr. Amada JupiterKirkpatrick started him on gabapentin 300mg  tid. He finished the medication and did not get refill from [facility]. [facility] would like us to have further plan regarding AED use.   08/30/17 follow up JX: pt has been doing well except 06/2017  had one episode of mild seizure. EMS called but no need to go to ER. He was still on titrating phase of lamical at that time. Now his lamictal is at 100mg  bid. He was also started gabapentin 300mg  tid for neuropathy by his PCP. No more seizure episode since.   02/28/18 follow up JX: During the interval time, pt had 2 episodes concerning for seizure. One was 3 months ago, pt was in standing position, wife noticed that pt became glazed eyes and staring off, people helped  him sitting down, it lasted about 3-4 min. No shaking or jerking. Another one was last Friday, he was standing in bathroom brushing his teeth, had both hand shaking, eyes rolling back, mouth smacking, and LOC. Family laid him down and called EMS. Episode lasted 3-4 min, and on EMS arrival his BP 200s/100s. No obvious post ictal after. Today his BP 154/76 but wife said his BP at home 100s/70s. He takes lamictal and gabapentin at home, but wife and caregiver not sure the dosage of lamictal at this time.  Family and caregiver also complain that patient sleeps a lot at home during the day for the last 2-3 months. He has good sleep at night but still sleepy during the day. On gabapentin 300mg  tid and seroquel 25mg  Qhs but seems have been on for a while. Family does not want to change this time.    04/21/18 visit: Since previous visit, patient was recently seen in the ED for seizure activity on 03/30/2018.  Patient was in his usual state of health and was ambulating to the bathroom when his wife found him down with a generalized tonic-clonic movements that lasted approximately 4 minutes.  Lamictal dose was previously increased from 100 mg twice daily and gradually increased to 150 mg twice daily on 02/28/2018.  Per hospital notes, patient states compliance with all seizure medications.  All imaging's were negative for acute abnormality.  UA was negative for infections.  No EKG changes or elevation in tropes.  Patient is being seen today for follow-up visit and is accompanied by his wife and caregiver.  He states since recent hospital admission, he has not had seizure activity.  He was not at full dose of Lamictal when he had the seizure activity that required hospitalization but since last week, he has been taking full dose of Lamictal 150 mg twice daily.  Patient is tolerating this well without side effects.  He does have a caregiver from Monday through Friday from 7 AM to 3 PM.  Overall patient states he feels he is  doing well and has no concerns/complaints.      REVIEW OF SYSTEMS: Full 14 system review of systems performed and notable only for those listed below and in HPI above, all others are negative:  Seizure and memory loss   The following represents the patient's updated allergies and side effects list: No Known Allergies  The neurologically relevant items on the patient's problem list were reviewed on today's visit.  Neurologic Examination  A problem focused neurological exam (12 or more points of the single system neurologic examination, vital signs counts as 1 point, cranial nerves count for 8 points) was performed.  Blood pressure (!) 151/76, pulse (!) 59, height 5\' 9"  (1.753 m), weight 199 lb 9.6 oz (90.5 kg).  General - Well nourished, well developed, pleasant elderly African-American male, in no apparent distress, pleasant.  Ophthalmologic - Fundi not visualized due to noncooperation.  Cardiovascular - Regular rate and rhythm.  Mental  Status -  Awake alert, orientated to self but disoriented to place, time and family members.  When patient was asked who accompanied him to today's visit, he states this lady that takes care of me and my mother. Language exam showed fluent speech. Able to follow some simple commands and cooperative with exam.  Cranial Nerves II - XII - II - not cooperative on exam, but able to blink bilaterally to visual threat III, IV, VI - eye movement intact. V - Facial sensation intact bilaterally. VII - Facial movement intact bilaterally. VIII - HOH bilaterally X - Palate elevates symmetrically. XI - Chin turning & shoulder shrug intact bilaterally. XII - Tongue protrusion intact.  Motor Strength - The patient's strength was normal in all extremities and pronator drift was absent.  Bulk was normal and fasciculations were absent.   Motor Tone - Muscle tone was assessed at the neck and appendages and was normal.  Reflexes - The patient's reflexes were 1+  in all extremities and he had no pathological reflexes.  Sensory - Light touch, temperature/pinprick were assessed and were normal.    Coordination - The patient had normal movements in the hands and feet with no ataxia or dysmetria.  Tremor was absent.  Gait and Station - walk with cane, slow, cautions gait but steady.     Assessment: Domani Kirvin is an 84 year old male with seizures s/p right frontal ICH on 06/24/2016 in setting of hypertension due to noncompliance or CVA given recurrent lobar ICHs.  History of left occipital ICH with SAH, SDH and IVH on 02/08/2015.  First onset G TC 02/21/2017 and as medications were not started by [facility] as requested by wife/patient, another G TC 04/2017 with initiation of gabapentin and Lamictal.  Recurrent episode 06/2017 during titration of Lamictal.  Possible recurrent seizure versus syncopal events on 11/23/2017 and 02/20/2018.  Vascular risk factors include HTN, HLD, DM, CAD, possible CAA and atrial flutter.  He returns today for follow-up visit with family reported seizure activity on 10/05/2018 and worsening of memory/cognition.    Plan:  - continue lipitor for stroke prevention - avoid antiplatelet due to concerns of CAA -Continue lamictal 150mg  BID -Will obtain lamotrigine levels today along with requesting lab work from recent PCP visit -discussion with wife regarding potential breakthrough seizures occasionally especially with increased stress but as this has been the only seizure occurrence in a month's time, recommended to continue current dose unless abnormal labs found.  No need to repeat EEG at this time.  W[facility] [place] [facility] -Continue Seroquel nightly to assist with dementia behaviors.  Recommended possible need of additional medication management to assist with worsening of dementia with behaviors.  Wife prefers to follow with [facility] and will notify them if behaviors worsen for management - Follow  up with your [facility] physician for stroke risk factor modification and seizure control. Recommend maintain blood pressure goal <140/80, diabetes with hemoglobin A1c goal below 6.5% and lipids with LDL cholesterol goal below 70 mg/dL.  - continue seroquel for sun downing. Keep active during the day.   follow up in 6 months or call earlier if needed  I spent more than 25 minutes of face to face time with the patient. Greater than 50% of time was spent in counseling and coordination of care. We have discussed about lamictal dose change, keep active, no driving, and no antiplatelet therapy.    George Hugh, AGNP-BC  [facility] [phone]  [place], [place] [address]  Phone [phone] Fax [phone] Note: This document was prepared with digital dictation and possible smart phrase technology. Any transcriptional errors that result from this process are unintentional.

## 2018-10-23 NOTE — Patient Instructions (Addendum)
Continue lipitor  for secondary stroke prevention  Continue to follow up with PCP regarding cholesterol, blood pressure and diabetes management   Continue lamictal 150mg  twice daily for seizure prevention - [facility] - we will check lamictal levels today to ensure good levels   For dementia - consider starting medications to assist with behaviors and worsening of dementia   Continue to monitor blood pressure at home  Maintain strict control of hypertension with blood pressure goal below 130/90, diabetes with hemoglobin A1c goal below 6.5% and cholesterol with LDL cholesterol (bad cholesterol) goal below 70 mg/dL. I also advised the patient to eat a healthy diet with plenty of whole grains, cereals, fruits and vegetables, exercise regularly and maintain ideal body weight.  Followup in the future with me in 6 months or call earlier if needed       Thank you for coming to see [place] at [facility]. I hope we have been able to provide you high quality care today.  You may receive a patient satisfaction survey over the next few weeks. We would appreciate your feedback and comments so that we may continue to improve ourselves and the health of our patients.

## 2018-10-24 LAB — LAMOTRIGINE LEVEL: LAMOTRIGINE LVL: 4 ug/mL (ref 2.0–20.0)

## 2018-10-25 ENCOUNTER — Telehealth: Payer: Self-pay

## 2018-10-25 NOTE — Telephone Encounter (Signed)
I called patients wife Thelma(on dpr) that her husbands lamictal levels were in satisfactory range but on the lower end. I reminded per Gita Kudo NP if pt has anymore seizures to please call us because the medication can be increase if needed. The wife verbalized understanding. ------

## 2018-10-25 NOTE — Telephone Encounter (Signed)
-----   Message from Austin Hugh, NP sent at 10/25/2018  7:17 AM EST ----- Please advise patient that his Lamictal levels were in the satisfactory range but on the lower end of satisfactory.  Please remind her to notify office if he does have any additional seizure activity and we can consider increasing further if needed.

## 2019-03-12 ENCOUNTER — Other Ambulatory Visit: Payer: Self-pay

## 2019-03-12 ENCOUNTER — Encounter (HOSPITAL_BASED_OUTPATIENT_CLINIC_OR_DEPARTMENT_OTHER): Payer: Self-pay

## 2019-03-12 ENCOUNTER — Emergency Department (HOSPITAL_BASED_OUTPATIENT_CLINIC_OR_DEPARTMENT_OTHER)
Admission: EM | Admit: 2019-03-12 | Discharge: 2019-03-12 | Disposition: A | Discharge status: Home / Self Care | Payer: Medicare Other | Attending: Emergency Medicine | Admitting: Emergency Medicine

## 2019-03-12 ENCOUNTER — Emergency Department (HOSPITAL_BASED_OUTPATIENT_CLINIC_OR_DEPARTMENT_OTHER): Payer: Medicare Other

## 2019-03-12 DIAGNOSIS — R079 Chest pain, unspecified: Secondary | ICD-10-CM | POA: Diagnosis not present

## 2019-03-12 DIAGNOSIS — Z87891 Personal history of nicotine dependence: Secondary | ICD-10-CM | POA: Insufficient documentation

## 2019-03-12 DIAGNOSIS — E1159 Type 2 diabetes mellitus with other circulatory complications: Secondary | ICD-10-CM | POA: Insufficient documentation

## 2019-03-12 DIAGNOSIS — I259 Chronic ischemic heart disease, unspecified: Secondary | ICD-10-CM | POA: Insufficient documentation

## 2019-03-12 DIAGNOSIS — Z7984 Long term (current) use of oral hypoglycemic drugs: Secondary | ICD-10-CM | POA: Insufficient documentation

## 2019-03-12 DIAGNOSIS — F0150 Vascular dementia without behavioral disturbance: Secondary | ICD-10-CM | POA: Diagnosis not present

## 2019-03-12 DIAGNOSIS — I110 Hypertensive heart disease with heart failure: Secondary | ICD-10-CM | POA: Insufficient documentation

## 2019-03-12 DIAGNOSIS — Z79899 Other long term (current) drug therapy: Secondary | ICD-10-CM | POA: Diagnosis not present

## 2019-03-12 DIAGNOSIS — I5022 Chronic systolic (congestive) heart failure: Secondary | ICD-10-CM | POA: Insufficient documentation

## 2019-03-12 LAB — CBC WITH DIFFERENTIAL/PLATELET
Abs Immature Granulocytes: 0.01 10*3/uL (ref 0.00–0.07)
Basophils Absolute: 0.1 10*3/uL (ref 0.0–0.1)
Basophils Relative: 1 %
Eosinophils Absolute: 0.2 10*3/uL (ref 0.0–0.5)
Eosinophils Relative: 3 %
HCT: 39.9 % (ref 39.0–52.0)
Hemoglobin: 12.4 g/dL — ABNORMAL LOW (ref 13.0–17.0)
Immature Granulocytes: 0 %
Lymphocytes Relative: 13 %
Lymphs Abs: 1 10*3/uL (ref 0.7–4.0)
MCH: 28.8 pg (ref 26.0–34.0)
MCHC: 31.1 g/dL (ref 30.0–36.0)
MCV: 92.6 fL (ref 80.0–100.0)
Monocytes Absolute: 0.8 10*3/uL (ref 0.1–1.0)
Monocytes Relative: 10 %
Neutro Abs: 5.7 10*3/uL (ref 1.7–7.7)
Neutrophils Relative %: 73 %
Platelets: 178 10*3/uL (ref 150–400)
RBC: 4.31 MIL/uL (ref 4.22–5.81)
RDW: 14.6 % (ref 11.5–15.5)
WBC: 7.7 10*3/uL (ref 4.0–10.5)
nRBC: 0 % (ref 0.0–0.2)

## 2019-03-12 LAB — COMPREHENSIVE METABOLIC PANEL
ALT: 13 U/L (ref 0–44)
AST: 20 U/L (ref 15–41)
Albumin: 4.2 g/dL (ref 3.5–5.0)
Alkaline Phosphatase: 61 U/L (ref 38–126)
Anion gap: 8 (ref 5–15)
BUN: 13 mg/dL (ref 8–23)
CO2: 28 mmol/L (ref 22–32)
Calcium: 8.8 mg/dL — ABNORMAL LOW (ref 8.9–10.3)
Chloride: 105 mmol/L (ref 98–111)
Creatinine, Ser: 1.08 mg/dL (ref 0.61–1.24)
GFR calc Af Amer: >60 mL/min (ref >60)
GFR calc non Af Amer: >60 mL/min (ref >60)
Glucose, Bld: 95 mg/dL (ref 70–99)
Potassium: 3.9 mmol/L (ref 3.5–5.1)
Sodium: 141 mmol/L (ref 135–145)
Total Bilirubin: 0.6 mg/dL (ref 0.3–1.2)
Total Protein: 7.1 g/dL (ref 6.5–8.1)

## 2019-03-12 LAB — TROPONIN I
Troponin I: <0.03 ng/mL (ref <0.03)
Troponin I: <0.03 ng/mL (ref <0.03)

## 2019-03-12 NOTE — ED Provider Notes (Signed)
[facility] EMERGENCY DEPARTMENT Provider Note   CSN: [id number] Arrival date & time: 03/12/19  1053    History   Chief Complaint Chief Complaint  Patient presents with  . Chest Pain    HPI Austin Sawyer is a 84 y.o. male.     HPI  84 year old male with a history of coronary artery disease, dementia, diabetes, hypertension, CVA, seizures, cerebral amyloid angiopathy, atrial flutter, SVT, CHF, who presents with concern for chest pain.  Presents with chest pain, has been intermittent for the last week, had 3 episodes and then another episode this morning  Points to middle of chest, not able to give quality of pain.  Caregiver reports that he will hold his chest.  He had 3 prior episodes this week and one today prior to arrival.  Reports that the episode today happened about 945.  Reported that each of the episodes are brief, they believe lasting only minutes.  Sometimes they have occurred after eating, but they do not occur after eating every time.  Denies shortness of breath, nausea, vomiting, fevers, cough, other concerns.  Past Medical History:  Diagnosis Date  . Alcohol abuse, in remission   . Atrial flutter (HCC)   . Back pain   . Cervicalgia   . Coronary artery disease   . Coronary atherosclerosis of unspecified type of vessel, native or graft   . Dementia (HCC)   . Depression   . Diabetes mellitus without complication (HCC)   . Disturbance of skin sensation   . Hypertension   . MI, old   . Reflux   . Seizures (HCC)   . Stroke (HCC)   . Unspecified hearing loss   . Unspecified sleep apnea     Patient Active Problem List   Diagnosis Date Noted  . Seizures (HCC) 03/01/2017  . Cerebral amyloid angiopathy (CODE) 11/24/2016  . Nontraumatic cortical hemorrhage of right cerebral hemisphere (HCC) 11/24/2016  . Vascular dementia (HCC) 11/24/2015  . Atrial flutter, unspecified 11/24/2015  . Cognitive impairment 08/19/2015  . Type 2 diabetes mellitus with  other circulatory complications (HCC) 05/14/2015  . Chronic systolic congestive heart failure (HCC) 05/14/2015  . ICH (intracerebral hemorrhage) (HCC)   . HLD (hyperlipidemia)   . Agitation   . Accelerated hypertension   . Encephalopathy acute 02/17/2015  . SVT (supraventricular tachycardia) (HCC) 02/16/2015  . Cytotoxic brain edema (HCC)   . Encounter for feeding tube placement   . CHF (congestive heart failure) (HCC)   . Atelectasis   . Acute respiratory failure with hypoxemia (HCC) 02/10/2015  . Hemorrhage of brain, nontraumatic (HCC) 02/08/2015  . Stroke due to intracerebral hemorrhage (HCC) 02/08/2015  . Cervical disc disorder with radiculopathy of cervical region 09/14/2013  . Neck pain on left side 07/12/2013  . Paresthesia of left arm 07/12/2013  . Major depressive disorder, recurrent episode, severe (HCC) 04/30/2013  . Alcohol dependence in remission (HCC) 04/22/2013  . Hard of hearing 04/22/2013  . OBESITY, UNSPECIFIED 12/09/2009  . CARDIOMYOPATHY, SECONDARY 09/03/2009  . ATRIAL FLUTTER 09/03/2009  . Gout 03/12/2008  . HYPERGLYCEMIA 02/14/2008  . ANGINA PECTORIS 10/03/2007  . BARRETTS ESOPHAGUS 10/03/2007  . ARTHRITIS 10/03/2007  . WEIGHT LOSS 04/06/2007  . ANXIETY DEPRESSION 03/14/2007  . ABUSE, ALCOHOL, IN REMISSION 03/08/2007  . CORONARY ARTERY DISEASE 03/08/2007  . LACUNAR INFARCTION 03/08/2007  . COLONOSCOPY, HX OF 03/08/2007  . HYPOGONADISM 01/25/2007  . Hyperlipidemia 01/25/2007  . Essential hypertension 01/25/2007  . CONGESTIVE HEART FAILURE 01/25/2007  . GERD 01/25/2007  .  SLEEP APNEA 01/25/2007    Past Surgical History:  Procedure Laterality Date  . HERNIA REPAIR    . IR GENERIC HISTORICAL  07/16/2016   IR REPLC GASTRO/COLONIC TUBE PERCUT W/FLUORO 07/16/2016 Irish LackGlenn Yamagata, MD [facility]-INTERV RAD  . IR GENERIC HISTORICAL  09/07/2016   IR GASTROSTOMY TUBE REMOVAL 09/07/2016 [facility]-INTERV RAD  . PEG PLACEMENT N/A 07/02/2016   Procedure: PERCUTANEOUS ENDOSCOPIC  GASTROSTOMY (PEG) PLACEMENT;  Surgeon: [name] Thompson, MD;  [facility];  Service: Endoscopy;  Laterality: N/A;  . ROTATOR CUFF REPAIR          Home Medications    Prior to Admission medications   Medication Sig Start Date End Date Taking? Authorizing Provider  acetaminophen (TYLENOL) 325 MG tablet Take 650 mg by mouth every 4 (four) hours as needed for mild pain.    [provider]  albuterol (PROVENTIL) (2.5 MG/3ML) 0.083% nebulizer solution Take 3 mLs (2.5 mg total) by nebulization 2 (two) times daily as needed for wheezing or shortness of breath. 02/21/15   Rinehuls, Kinnie Scalesavid L, PA-C  allopurinol (ZYLOPRIM) 300 MG tablet Take 300 mg by mouth daily. For gout    [provider]  atorvastatin (LIPITOR) 20 MG tablet Take 1 tablet (20 mg total) by mouth daily at 6 PM. 07/02/16   Layne BentonBiby, Sharon L, NP  bisacodyl (DULCOLAX) 10 MG suppository Place 10 mg rectally daily as needed for moderate constipation. x2 days    [provider]  carvedilol (COREG) 12.5 MG tablet Take 12.5 mg by mouth 2 (two) times daily with a meal. Patient takes 12.5 mg at lunch and 12.5 mg at dinner.    [provider]  gabapentin (NEURONTIN) 300 MG capsule Take 300 mg by mouth 3 (three) times daily.    [provider]  geriatric multivitamins-minerals (ELDERTONIC/GEVRABON) ELIX Take 15 mLs by mouth 2 (two) times daily.    [provider]  hydrALAZINE (APRESOLINE) 50 MG tablet Take 50 mg by mouth 4 (four) times daily.     [provider]  lamoTRIgine (LAMICTAL) 150 MG tablet Take 1 tablet (150 mg total) by mouth 2 (two) times daily. 04/21/18   George HughVanschaick, [name], NP  lisinopril (PRINIVIL,ZESTRIL) 20 MG tablet Take 1 tablet (20 mg total) by mouth 2 (two) times daily. 07/06/16   Marvel PlanXu, Jindong, MD  loratadine (CLARITIN) 10 MG tablet Take 10 mg by mouth daily as needed for allergies.    [provider]  metFORMIN (GLUCOPHAGE) 500 MG tablet Take 500 mg by  mouth daily.     [provider]  nitroGLYCERIN (NITROSTAT) 0.4 MG SL tablet Place 0.4 mg under the tongue every 5 (five) minutes as needed. Chest pain    [provider]  NUTRITIONAL SUPPLEMENT LIQD Take 120 mLs by mouth 2 (two) times daily. NSA MedPass between breakfast and lunch and lunch and dinner    [provider]  Nutritional Supplements (NUTRITIONAL SUPPLEMENT PO) Take 1 each by mouth daily. Magic Cup at [employer]9AM    [provider]  QUEtiapine (SEROQUEL) 25 MG tablet Take 1 tablet (25 mg total) by mouth at bedtime. 02/21/15   Rinehuls, David L, PA-C  senna-docusate (SENOKOT-S) 8.6-50 MG tablet Take 1 tablet by mouth 2 (two) times daily. 07/02/16   Layne BentonBiby, Sharon L, NP  tamsulosin (FLOMAX) 0.4 MG CAPS capsule Take 0.4 mg by mouth daily.     [provider]    Family History Family History  Problem Relation Age of Onset  . Anxiety disorder Mother   .  Cancer Mother     Social History Social History   Tobacco Use  . Smoking status: Former Games developermoker  . Smokeless tobacco: Never Used  Substance Use Topics  . Alcohol use: No  . Drug use: No     Allergies   Patient has no known allergies.   Review of Systems Review of Systems  Unable to perform ROS: Dementia  Constitutional: Negative for fever.  Respiratory: Negative for cough and shortness of breath.   Cardiovascular: Positive for chest pain.  Gastrointestinal: Negative for abdominal pain, nausea and vomiting.  Musculoskeletal: Negative for back pain.  Neurological: Negative for headaches.     Physical Exam Updated Vital Signs BP (!) 182/76   Pulse (!) 55   Temp 98.7 F (37.1 C) (Oral)   Resp 17   SpO2 98%   Physical Exam Vitals signs and nursing note reviewed.  Constitutional:      General: He is not in acute distress.    Appearance: He is well-developed. He is not diaphoretic.  HENT:     Head: Normocephalic and atraumatic.  Eyes:     Conjunctiva/sclera: Conjunctivae  normal.  Neck:     Musculoskeletal: Normal range of motion.  Cardiovascular:     Rate and Rhythm: Normal rate and regular rhythm.     Heart sounds: Normal heart sounds. No murmur. No friction rub. No gallop.   Pulmonary:     Effort: Pulmonary effort is normal. No respiratory distress.     Breath sounds: Normal breath sounds. No wheezing or rales.  Abdominal:     General: There is no distension.     Palpations: Abdomen is soft.     Tenderness: There is no abdominal tenderness. There is no guarding.  Skin:    General: Skin is warm and dry.  Neurological:     Mental Status: He is alert.      ED Treatments / Results  Labs (all labs ordered are listed, but only abnormal results are displayed) Labs Reviewed  CBC WITH DIFFERENTIAL/PLATELET - Abnormal; Notable for the following components:      Result Value   Hemoglobin 12.4 (*)    All other components within normal limits  COMPREHENSIVE METABOLIC PANEL - Abnormal; Notable for the following components:   Calcium 8.8 (*)    All other components within normal limits  TROPONIN I    EKG EKG Interpretation  Date/Time:  Monday March 12 2019 11:03:53 EDT Ventricular Rate:  60 PR Interval:    QRS Duration: 100 QT Interval:  421 QTC Calculation: 421 R Axis:   64 Text Interpretation:  Sinus rhythm Probable LVH with secondary repol abnrm Anterior Q waves, possibly due to LVH No significant change since last tracing Confirmed by Alvira MondaySchlossman, Mairin Lindsley ([id number]) on 03/12/2019 11:15:58 AM   Radiology Dg Chest 2 View  Result Date: 03/12/2019 CLINICAL DATA:  Chest pain this morning. EXAM: CHEST - 2 VIEW COMPARISON:  PA and lateral chest 03/30/2018 and 04/24/2017. FINDINGS: The lungs are clear. Heart size is normal. Lung volumes are low. No pneumothorax or pleural effusion. No acute or focal bony abnormality. IMPRESSION: No acute disease. Electronically Signed   By: Drusilla Kannerhomas  Dalessio M.D.   On: 03/12/2019 11:27    Procedures Procedures (including  critical care time)  Medications Ordered in ED Medications - No data to display   Initial Impression / Assessment and Plan / ED Course  I have reviewed the triage vital signs and the nursing notes.  Pertinent labs & imaging results that  were available during my care of the patient were reviewed by me and considered in my medical decision making (see chart for details).        84 year old male with a history of coronary artery disease, dementia, diabetes, hypertension, CVA, seizures, cerebral amyloid angiopathy, atrial flutter, SVT, CHF, who presents with concern for chest pain.  EKG without acute abnormalities.  Chest x-ray shows no sign of pneumonia, pneumothorax, or pulmonary edema.  Labs show no segment abnormalities.  Discussed that patient is high risk given his history of coronary artery disease, recommend mission for further evaluation of his chest pain, however wife reports that she would like to take him home.  I discussed with her the risks of this in detail, including concerned that his chest pain may be secondary to a coronary artery disease, and risk of heart attack and death.  She states understanding, but would like to leave.  He had delta troponins completed which were negative.  Recommend follow-up with cardiology. Patient discharged in stable condition with understanding of reasons to return.   Final Clinical Impressions(s) / ED Diagnoses   Final diagnoses:  Chest pain, unspecified type    ED Discharge Orders    None       Gareth Morgan, MD 03/12/19 1704

## 2019-03-12 NOTE — ED Triage Notes (Signed)
Pt complained of chest pain this morning after breakfast.  Caregiver at bedside, pt with hx alzheimers.  Pt denies chest pain at this time.  Has had frequent episodes of intermittent chest pain 3x this past week.  [facility], unable to see pmd or cardiologist.

## 2019-03-12 NOTE — ED Notes (Signed)
MD at bedside. 

## 2019-04-23 ENCOUNTER — Ambulatory Visit (INDEPENDENT_AMBULATORY_CARE_PROVIDER_SITE_OTHER): Payer: Medicare Other | Admitting: Adult Health

## 2019-04-23 ENCOUNTER — Encounter: Payer: Self-pay | Admitting: Adult Health

## 2019-04-23 ENCOUNTER — Other Ambulatory Visit: Payer: Self-pay

## 2019-04-23 VITALS — BP 179/88 | HR 63 | Temp 98.6°F | Ht 69.0 in | Wt 195.6 lb

## 2019-04-23 DIAGNOSIS — Z79899 Other long term (current) drug therapy: Secondary | ICD-10-CM | POA: Diagnosis not present

## 2019-04-23 DIAGNOSIS — R569 Unspecified convulsions: Secondary | ICD-10-CM

## 2019-04-23 MED ORDER — LAMOTRIGINE 150 MG PO TABS: 150.0000 mg | ORAL_TABLET | Freq: Two times a day (BID) | ORAL | 3 refills | Status: AC

## 2019-04-23 MED FILL — lamoTRIgine 150 MG TABS: 150 | 90 days supply | Qty: 180 | Fill #0

## 2019-04-23 NOTE — Progress Notes (Signed)
STROKE NEUROLOGY FOLLOW UP NOTE  NAME: Austin Sawyer DOB: 04/18/1935  REASON FOR VISIT: Seizure follow-up and medication management HISTORY FROM: chart and pt and wife  Chief Complaint  Patient presents with   Follow-up    6 mon f/u. Caregiver present. Rm 9. No new concerns at this time.     HPI: 04/23/19 VISIT Mr. Beverely PaceBryant is a 84 year old male who is being seen today for follow up regarding seizures and is accompanied by his caregiver. He continues on lamotrigine 150mg  BID without recurrent seizure activity. Prior lamotrigine level satisfactory.  Continues on atorvastatin 20 mg daily for HLD and secondary stroke prevention. Blood pressure elevated today at 179/88, asymptomatic - not routinely monitored at home. He continues to receive caregiver assistance M-F for 8 hours.  Continues to ambulate with cane and denies recent falls.  No further concerns at this time.    History 10/23/2018: Patient is being seen today for seizure follow-up visit and medication management.  He is accompanied by his wife and caregiver.  He continues on lamotrigine 150 mg BID. Reported compliance by wife and caregiver. Wife does report recurrent seizure on 10/05/18 which lasted approx 4-5 minutes with reported shaking of his legs. No further events since prior appt and denies any recurrent seizure activity since 10/05/18.  [facility].  Wife does report blood work was obtained approximately 2 weeks ago but is unsure if lamotrigine level was obtained.  Wife does state that prior to his recent seizure activity, patient was agitated and very upset and was questioning whether this could have led to his seizure activity.  Wife does endorse worsening of his memory and cognition over the past 6 months.  He does have aide assistance for 8 hours/day and his wife cares for him in the evening and overnight.  She does endorse patient sleeps well during the night but at times during the  day, can have behavioral issues with aggression, paranoia and confusion.  Continues on atorvastatin without side effects myalgias.  Blood pressure 151/76. He does use cane for ambulation while outside. He does not need for indoor.  No further concerns at this time.  History summary Mr. Austin CheLeon Iglesia is a 84 y.o. male with history of HTN, DM, CAD, atrial flutter on Coumadin before until 2011, alcohol abuse admitted on 02/08/15 for headache, nausea vomiting, confusion, blurred vision. CT had showed left occipital ICH with small SAH, SDH and IVH. Put on 3% saline and admitted to ICU. He was then stabilized and off 3% saline. Stroke work up including MRA, CUS, 2D ehco, and EEG were negative. Repeat CT head stable. LDL 107. He was discharged to SNF after medically ready.   06/24/16 admission -right frontal ICH with midline shift in setting of hypertension due to noncompliance or CAA given recurrent ICHs.   02/21/17 admission-initial seizure activity: Pt was sitting in recliner, and had sudden onset eyes rolling back, LOC, shaking in all extremities, mouth foaming, lasted 3-4 min. 911 called, on arrival, pt started to wake up but still not able to communicate. However, in ED, pt was back to baseline. CT head negative for acute changes. Wife denies recent infection. UDA and UA negative. He was discharged from ED and requested follow up urgently in clinic.   05/24/17 follow up -recurrent seizure activity.  Pt has been doing the same. EEG diffuse slow mild, no seizure. However, [place] [facility] [place] [facility] [place]. In  04/2017, he had another GTC at home and sent to [facility] ER. Dr. Leonel Ramsay started him on gabapentin 300mg  tid. He finished the medication and did not get refill from [place]. [facility] would like [place] to have further plan regarding AED use.   08/30/17 follow up [place]: pt has been doing well except 06/2017 had one episode of mild seizure. EMS called but no need to go to ER. He was still on titrating phase of lamical  at that time. Now his lamictal is at 100mg  bid. He was also started gabapentin 300mg  tid for neuropathy by his PCP. No more seizure episode since.   02/28/18 follow up [place]: During the interval time, pt had 2 episodes concerning for seizure. One was 3 months ago, pt was in standing position, wife noticed that pt became glazed eyes and staring off, people helped him sitting down, it lasted about 3-4 min. No shaking or jerking. Another one was last Friday, he was standing in bathroom brushing his teeth, had both hand shaking, eyes rolling back, mouth smacking, and LOC. Family laid him down and called EMS. Episode lasted 3-4 min, and on EMS arrival his BP 200s/100s. No obvious post ictal after. Today his BP 154/76 but wife said his BP at home 100s/70s. He takes lamictal and gabapentin at home, but wife and caregiver not sure the dosage of lamictal at this time.  Family and caregiver also complain that patient sleeps a lot at home during the day for the last 2-3 months. He has good sleep at night but still sleepy during the day. On gabapentin 300mg  tid and seroquel 25mg  Qhs but seems have been on for a while. Family does not want to change this time.    04/21/18 visit: Since previous visit, patient was recently seen in the ED for seizure activity on 03/30/2018.  Patient was in his usual state of health and was ambulating to the bathroom when his wife found him down with a generalized tonic-clonic movements that lasted approximately 4 minutes.  Lamictal dose was previously increased from 100 mg twice daily and gradually increased to 150 mg twice daily on 02/28/2018.  Per hospital notes, patient states compliance with all seizure medications.  All imaging's were negative for acute abnormality.  UA was negative for infections.  No EKG changes or elevation in tropes.  Patient is being seen today for follow-up visit and is accompanied by his wife and caregiver.  He states since recent hospital admission, he has not had  seizure activity.  He was not at full dose of Lamictal when he had the seizure activity that required hospitalization but since last week, he has been taking full dose of Lamictal 150 mg twice daily.  Patient is tolerating this well without side effects.  He does have a caregiver from Monday through Friday from 7 AM to 3 PM.  Overall patient states he feels he is doing well and has no concerns/complaints.      REVIEW OF SYSTEMS: Full 14 system review of systems performed and notable only for those listed below and in HPI above, all others are negative:  Seizure, walking difficulty and memory loss   The following represents the patient's updated allergies and side effects list: No Known Allergies  The neurologically relevant items on the patient's problem list were reviewed on today's visit.  Neurologic Examination   Blood pressure (!) 179/88, pulse 63, temperature 98.6 F (37 C), temperature source Oral, height 5\' 9"  (1.753 m), weight 195 lb 9.6 oz (88.7  kg).  General - Well nourished, well developed, pleasant elderly African-American male, in no apparent distress, pleasant.  Ophthalmologic - Fundi not visualized due to noncooperation.  Cardiovascular - Regular rate and rhythm.  Mental Status -  Awake and alert, orientated to self but disoriented to place and time.   Language exam showed fluent speech. Able to follow some simple commands and cooperative with exam.  Cranial Nerves II - XII - II - not cooperative on exam, but able to blink bilaterally to visual threat III, IV, VI - eye movement intact. V - Facial sensation intact bilaterally. VII - Facial movement intact bilaterally. VIII - HOH bilaterally X - Palate elevates symmetrically. XI - Chin turning & shoulder shrug intact bilaterally. XII - Tongue protrusion intact.  Motor Strength - The patients strength was normal in all extremities and pronator drift was absent.  Bulk was normal and fasciculations were absent.    Motor Tone - Muscle tone was assessed at the neck and appendages and was normal.  Reflexes - The patients reflexes were 1+ in all extremities and he had no pathological reflexes.  Sensory - Light touch, temperature/pinprick were assessed and were normal.    Coordination - The patient had normal movements in the hands and feet with no ataxia or dysmetria.  Tremor was absent.  Gait and Station - walk with cane, slow, cautions gait but steady.     Assessment: Austin CheLeon Mick is an 84 year old male with seizures s/p right frontal ICH on 06/24/2016 in setting of hypertension due to noncompliance or CVA given recurrent lobar ICHs.  History of left occipital ICH with SAH, SDH and IVH on 02/08/2015.  First onset G TC 02/21/2017 and as [facility] wife[facility]patient, another G TC 04/2017 with initiation of gabapentin and Lamictal.  Recurrent episode 06/2017 during titration of Lamictal.  Possible recurrent seizure versus syncopal events on 11/23/2017 and 02/20/2018.  Vascular risk factors include HTN, HLD, DM, CAD, possible CAA and atrial flutter.  He returns today for follow-up visit with family reported seizure activity on 10/05/2018 and worsening of memory/cognition.    Plan:  -Continue lamotrigine 150 mg twice daily for seizure prophylaxis -Lab work obtained today to monitor lamotrigine levels.  No further blood work indicated as recent lab work obtained showing satisfactory levels - continue lipitor for stroke prevention - avoid antiplatelet due to concerns of CAA - Follow [facility] with [facility] for stroke risk factor modification and seizure control. Recommend maintain blood pressure goal <140/80, diabetes with hemoglobin A1c goal below 6.5% and lipids with LDL cholesterol goal below 70 mg/dL.  Advised caregiver to monitor blood pressure at home to ensure satisfactory levels as today's blood pressure elevated.  Advised to proceed to ED if patient becomes symptomatic or  blood pressure elevates further   follow up in 6 months or call earlier if needed  I spent more than 25 minutes of face to face time with the patient. Greater than 50% of time was spent in counseling and coordination of care. We have discussed about lamictal dose change, keep active, no driving, and no antiplatelet therapy.    George HughJessica Azhia Siefken, AGNP-BC  [facility] [address] [place], [place] [id number]  Phone [phone] Fax [phone] Note: This document was prepared with digital dictation and possible smart phrase technology. Any transcriptional errors that result from this process are unintentional.

## 2019-04-23 NOTE — Patient Instructions (Signed)
Your Plan:  Continue Lamictal 150 mg twice daily We will check lab work today to ensure satisfactory levels Continue Lipitor 20 mg daily for cholesterol management and secondary stroke prevention Continue to follow with PCP for ongoing monitoring and management  Follow-up in 6 months or call earlier if needed     Thank you for coming to see [place] at [facility]. I hope we have been able to provide you high quality care today.  You may receive a patient satisfaction survey over the next few weeks. We would appreciate your feedback and comments so that we may continue to improve ourselves and the health of our patients.

## 2019-04-23 NOTE — Progress Notes (Signed)
I agree with the above plan 

## 2019-04-25 ENCOUNTER — Telehealth: Payer: Self-pay | Admitting: *Deleted

## 2019-04-25 LAB — LAMOTRIGINE LEVEL: Lamotrigine Lvl: 6 ug/mL (ref 2.0–20.0)

## 2019-04-25 NOTE — Telephone Encounter (Signed)
Spoke with wife, advised her the patient has satisfactory Lamictal levels. He is to continue his current regimen.  She verbalized understanding, appreciation.

## 2019-08-28 ENCOUNTER — Other Ambulatory Visit: Payer: Self-pay

## 2019-10-02 ENCOUNTER — Other Ambulatory Visit: Payer: Self-pay

## 2019-10-02 ENCOUNTER — Emergency Department (HOSPITAL_BASED_OUTPATIENT_CLINIC_OR_DEPARTMENT_OTHER): Payer: Medicare Other

## 2019-10-02 ENCOUNTER — Encounter (HOSPITAL_BASED_OUTPATIENT_CLINIC_OR_DEPARTMENT_OTHER): Payer: Self-pay | Admitting: *Deleted

## 2019-10-02 ENCOUNTER — Inpatient Hospital Stay (HOSPITAL_BASED_OUTPATIENT_CLINIC_OR_DEPARTMENT_OTHER)
Admission: EM | Admit: 2019-10-02 | Discharge: 2019-10-05 | DRG: 683 | Disposition: A | Discharge status: Home Health | Payer: Medicare Other | Attending: Internal Medicine | Admitting: Internal Medicine

## 2019-10-02 DIAGNOSIS — E669 Obesity, unspecified: Secondary | ICD-10-CM | POA: Diagnosis present

## 2019-10-02 DIAGNOSIS — I2510 Atherosclerotic heart disease of native coronary artery without angina pectoris: Secondary | ICD-10-CM | POA: Diagnosis present

## 2019-10-02 DIAGNOSIS — N179 Acute kidney failure, unspecified: Secondary | ICD-10-CM | POA: Diagnosis not present

## 2019-10-02 DIAGNOSIS — I10 Essential (primary) hypertension: Secondary | ICD-10-CM | POA: Diagnosis not present

## 2019-10-02 DIAGNOSIS — Z87891 Personal history of nicotine dependence: Secondary | ICD-10-CM

## 2019-10-02 DIAGNOSIS — R253 Fasciculation: Secondary | ICD-10-CM | POA: Diagnosis not present

## 2019-10-02 DIAGNOSIS — Z818 Family history of other mental and behavioral disorders: Secondary | ICD-10-CM

## 2019-10-02 DIAGNOSIS — R531 Weakness: Secondary | ICD-10-CM | POA: Diagnosis present

## 2019-10-02 DIAGNOSIS — I110 Hypertensive heart disease with heart failure: Secondary | ICD-10-CM | POA: Diagnosis present

## 2019-10-02 DIAGNOSIS — Z20822 Contact with and (suspected) exposure to covid-19: Secondary | ICD-10-CM | POA: Diagnosis present

## 2019-10-02 DIAGNOSIS — D649 Anemia, unspecified: Secondary | ICD-10-CM | POA: Diagnosis present

## 2019-10-02 DIAGNOSIS — Z7984 Long term (current) use of oral hypoglycemic drugs: Secondary | ICD-10-CM

## 2019-10-02 DIAGNOSIS — Z79899 Other long term (current) drug therapy: Secondary | ICD-10-CM

## 2019-10-02 DIAGNOSIS — I4891 Unspecified atrial fibrillation: Secondary | ICD-10-CM | POA: Diagnosis present

## 2019-10-02 DIAGNOSIS — F329 Major depressive disorder, single episode, unspecified: Secondary | ICD-10-CM | POA: Diagnosis present

## 2019-10-02 DIAGNOSIS — G4730 Sleep apnea, unspecified: Secondary | ICD-10-CM | POA: Diagnosis present

## 2019-10-02 DIAGNOSIS — K219 Gastro-esophageal reflux disease without esophagitis: Secondary | ICD-10-CM | POA: Diagnosis present

## 2019-10-02 DIAGNOSIS — I5032 Chronic diastolic (congestive) heart failure: Secondary | ICD-10-CM | POA: Diagnosis present

## 2019-10-02 DIAGNOSIS — H9190 Unspecified hearing loss, unspecified ear: Secondary | ICD-10-CM | POA: Diagnosis present

## 2019-10-02 DIAGNOSIS — M109 Gout, unspecified: Secondary | ICD-10-CM | POA: Diagnosis present

## 2019-10-02 DIAGNOSIS — F0150 Vascular dementia without behavioral disturbance: Secondary | ICD-10-CM | POA: Diagnosis present

## 2019-10-02 DIAGNOSIS — M62838 Other muscle spasm: Secondary | ICD-10-CM | POA: Diagnosis not present

## 2019-10-02 DIAGNOSIS — R569 Unspecified convulsions: Secondary | ICD-10-CM

## 2019-10-02 DIAGNOSIS — Z6829 Body mass index (BMI) 29.0-29.9, adult: Secondary | ICD-10-CM

## 2019-10-02 DIAGNOSIS — E785 Hyperlipidemia, unspecified: Secondary | ICD-10-CM | POA: Diagnosis present

## 2019-10-02 DIAGNOSIS — E854 Organ-limited amyloidosis: Secondary | ICD-10-CM | POA: Diagnosis present

## 2019-10-02 DIAGNOSIS — F1011 Alcohol abuse, in remission: Secondary | ICD-10-CM | POA: Diagnosis present

## 2019-10-02 DIAGNOSIS — I252 Old myocardial infarction: Secondary | ICD-10-CM

## 2019-10-02 DIAGNOSIS — Z8673 Personal history of transient ischemic attack (TIA), and cerebral infarction without residual deficits: Secondary | ICD-10-CM

## 2019-10-02 DIAGNOSIS — N400 Enlarged prostate without lower urinary tract symptoms: Secondary | ICD-10-CM | POA: Diagnosis present

## 2019-10-02 DIAGNOSIS — E1159 Type 2 diabetes mellitus with other circulatory complications: Secondary | ICD-10-CM | POA: Diagnosis present

## 2019-10-02 DIAGNOSIS — I680 Cerebral amyloid angiopathy: Secondary | ICD-10-CM | POA: Diagnosis present

## 2019-10-02 DIAGNOSIS — G40909 Epilepsy, unspecified, not intractable, without status epilepticus: Secondary | ICD-10-CM | POA: Diagnosis present

## 2019-10-02 DIAGNOSIS — I5189 Other ill-defined heart diseases: Secondary | ICD-10-CM

## 2019-10-02 DIAGNOSIS — R209 Unspecified disturbances of skin sensation: Secondary | ICD-10-CM | POA: Diagnosis present

## 2019-10-02 DIAGNOSIS — E860 Dehydration: Secondary | ICD-10-CM | POA: Diagnosis present

## 2019-10-02 LAB — COMPREHENSIVE METABOLIC PANEL
ALT: 13 U/L (ref 0–44)
AST: 16 U/L (ref 15–41)
Albumin: 4 g/dL (ref 3.5–5.0)
Alkaline Phosphatase: 60 U/L (ref 38–126)
Anion gap: 8 (ref 5–15)
BUN: 46 mg/dL — ABNORMAL HIGH (ref 8–23)
CO2: 27 mmol/L (ref 22–32)
Calcium: 9.1 mg/dL (ref 8.9–10.3)
Chloride: 101 mmol/L (ref 98–111)
Creatinine, Ser: 2.95 mg/dL — ABNORMAL HIGH (ref 0.61–1.24)
GFR calc Af Amer: 22 mL/min — ABNORMAL LOW (ref >60)
GFR calc non Af Amer: 19 mL/min — ABNORMAL LOW (ref >60)
Glucose, Bld: 92 mg/dL (ref 70–99)
Potassium: 4.5 mmol/L (ref 3.5–5.1)
Sodium: 136 mmol/L (ref 135–145)
Total Bilirubin: 0.6 mg/dL (ref 0.3–1.2)
Total Protein: 7.3 g/dL (ref 6.5–8.1)

## 2019-10-02 LAB — CBC WITH DIFFERENTIAL/PLATELET
Abs Immature Granulocytes: 0.04 10*3/uL (ref 0.00–0.07)
Basophils Absolute: 0.1 10*3/uL (ref 0.0–0.1)
Basophils Relative: 1 %
Eosinophils Absolute: 0.3 10*3/uL (ref 0.0–0.5)
Eosinophils Relative: 3 %
HCT: 32 % — ABNORMAL LOW (ref 39.0–52.0)
Hemoglobin: 10.1 g/dL — ABNORMAL LOW (ref 13.0–17.0)
Immature Granulocytes: 0 %
Lymphocytes Relative: 11 %
Lymphs Abs: 1.1 10*3/uL (ref 0.7–4.0)
MCH: 29.7 pg (ref 26.0–34.0)
MCHC: 31.6 g/dL (ref 30.0–36.0)
MCV: 94.1 fL (ref 80.0–100.0)
Monocytes Absolute: 0.8 10*3/uL (ref 0.1–1.0)
Monocytes Relative: 8 %
Neutro Abs: 7.7 10*3/uL (ref 1.7–7.7)
Neutrophils Relative %: 77 %
Platelets: 173 10*3/uL (ref 150–400)
RBC: 3.4 MIL/uL — ABNORMAL LOW (ref 4.22–5.81)
RDW: 15.1 % (ref 11.5–15.5)
WBC: 10 10*3/uL (ref 4.0–10.5)
nRBC: 0 % (ref 0.0–0.2)

## 2019-10-02 LAB — URINALYSIS, ROUTINE W REFLEX MICROSCOPIC
Bilirubin Urine: NEGATIVE
Glucose, UA: NEGATIVE mg/dL
Hgb urine dipstick: NEGATIVE
Ketones, ur: NEGATIVE mg/dL
Leukocytes,Ua: NEGATIVE
Nitrite: NEGATIVE
Protein, ur: NEGATIVE mg/dL
Specific Gravity, Urine: 1.01 (ref 1.005–1.030)
pH: 5.5 (ref 5.0–8.0)

## 2019-10-02 LAB — CBG MONITORING, ED: Glucose-Capillary: 98 mg/dL (ref 70–99)

## 2019-10-02 LAB — MAGNESIUM: Magnesium: 2.1 mg/dL (ref 1.7–2.4)

## 2019-10-02 MED ORDER — SODIUM CHLORIDE 0.9 % IV BOLUS
1000.0000 mL | Freq: Once | INTRAVENOUS | Status: AC
  Administered 2019-10-02: 1000 mL via INTRAVENOUS

## 2019-10-02 MED ORDER — SODIUM CHLORIDE 0.9 % IV SOLN: Freq: Once | INTRAVENOUS | Status: AC

## 2019-10-02 MED ORDER — HALOPERIDOL LACTATE 5 MG/ML IJ SOLN
2.0000 mg | Freq: Once | INTRAMUSCULAR | Status: AC
  Administered 2019-10-02: 2 mg via INTRAVENOUS
  Filled 2019-10-02: qty 1

## 2019-10-02 NOTE — ED Notes (Signed)
In to reassess pt, appears more calmer at this time, ensure side rails x 2 remain up, bed in lowest position, callbell within reach, remains on cont cardiac monitor.

## 2019-10-02 NOTE — ED Notes (Signed)
Medicated per EDP orders, safety measures in place.

## 2019-10-02 NOTE — ED Notes (Signed)
Is unable to state why he is here in the ED, states does not why his family brought him. Pt able to state his name and states wife brought him here but he was supposed to be at the other hospital.

## 2019-10-02 NOTE — ED Notes (Signed)
Attempted to call wife to update on husbands current status and status of tx to [facility]. No answer

## 2019-10-02 NOTE — ED Provider Notes (Signed)
[facility] EMERGENCY DEPARTMENT Provider Note   CSN: [id number] Arrival date & time: 10/02/19  1112     History Chief Complaint  Patient presents with  . Altered Mental Status    Austin Sawyer is a 84 y.o. male.  The history is provided by the patient, the spouse and medical records. No language interpreter was used.  Altered Mental Status Presenting symptoms: unresponsiveness   Presenting symptoms comment:  Possible seizure with twitching Severity:  Severe Most recent episode:  Today Episode history:  Single Duration:  2 hours Timing:  Constant Progression:  Improving Chronicity:  New Context: dementia   Associated symptoms: seizures   Associated symptoms: no abdominal pain, no agitation, no decreased appetite, no depression, no fever, no headaches, no light-headedness, no nausea, no palpitations, no vomiting and no weakness        Past Medical History:  Diagnosis Date  . Alcohol abuse, in remission   . Atrial flutter (HCC)   . Back pain   . Cervicalgia   . Coronary artery disease   . Coronary atherosclerosis of unspecified type of vessel, native or graft   . Dementia (HCC)   . Depression   . Diabetes mellitus without complication (HCC)   . Disturbance of skin sensation   . Hypertension   . MI, old   . Reflux   . Seizures (HCC)   . Stroke (HCC)   . Unspecified hearing loss   . Unspecified sleep apnea     Patient Active Problem List   Diagnosis Date Noted  . Seizures (HCC) 03/01/2017  . Cerebral amyloid angiopathy (CODE) 11/24/2016  . Nontraumatic cortical hemorrhage of right cerebral hemisphere (HCC) 11/24/2016  . Vascular dementia (HCC) 11/24/2015  . Atrial flutter, unspecified 11/24/2015  . Cognitive impairment 08/19/2015  . Type 2 diabetes mellitus with other circulatory complications (HCC) 05/14/2015  . Chronic systolic congestive heart failure (HCC) 05/14/2015  . ICH (intracerebral hemorrhage) (HCC)   . HLD (hyperlipidemia)   .  Agitation   . Accelerated hypertension   . Encephalopathy acute 02/17/2015  . SVT (supraventricular tachycardia) (HCC) 02/16/2015  . Cytotoxic brain edema (HCC)   . Encounter for feeding tube placement   . CHF (congestive heart failure) (HCC)   . Atelectasis   . Acute respiratory failure with hypoxemia (HCC) 02/10/2015  . Hemorrhage of brain, nontraumatic (HCC) 02/08/2015  . Stroke due to intracerebral hemorrhage (HCC) 02/08/2015  . Cervical disc disorder with radiculopathy of cervical region 09/14/2013  . Neck pain on left side 07/12/2013  . Paresthesia of left arm 07/12/2013  . Major depressive disorder, recurrent episode, severe (HCC) 04/30/2013  . Alcohol dependence in remission (HCC) 04/22/2013  . Hard of hearing 04/22/2013  . OBESITY, UNSPECIFIED 12/09/2009  . CARDIOMYOPATHY, SECONDARY 09/03/2009  . ATRIAL FLUTTER 09/03/2009  . Gout 03/12/2008  . HYPERGLYCEMIA 02/14/2008  . ANGINA PECTORIS 10/03/2007  . BARRETTS ESOPHAGUS 10/03/2007  . ARTHRITIS 10/03/2007  . WEIGHT LOSS 04/06/2007  . ANXIETY DEPRESSION 03/14/2007  . ABUSE, ALCOHOL, IN REMISSION 03/08/2007  . CORONARY ARTERY DISEASE 03/08/2007  . LACUNAR INFARCTION 03/08/2007  . COLONOSCOPY, HX OF 03/08/2007  . HYPOGONADISM 01/25/2007  . Hyperlipidemia 01/25/2007  . Essential hypertension 01/25/2007  . CONGESTIVE HEART FAILURE 01/25/2007  . GERD 01/25/2007  . SLEEP APNEA 01/25/2007    Past Surgical History:  Procedure Laterality Date  . HERNIA REPAIR    . IR GENERIC HISTORICAL  07/16/2016   IR REPLC GASTRO/COLONIC TUBE PERCUT W/FLUORO 07/16/2016 Irish Lack, MD [facility]-INTER[facility] RAD  .  IR GENERIC HISTORICAL  09/07/2016   IR GASTROSTOMY TUBE REMOVAL 09/07/2016 [facility]INTER[facility] RAD  . PEG PLACEMENT N/A 07/02/2016   Procedure: PERCUTANEOUS ENDOSCOPIC GASTROSTOMY (PEG) PLACEMENT;  Surgeon: [name] Thompson, MD;  [facility];  Service: Endoscopy;  Laterality: N/A;  . ROTATOR CUFF REPAIR         Family History  Problem  Relation Age of Onset  . Anxiety disorder Mother   . Cancer Mother     Social History   Tobacco Use  . Smoking status: Former Games developermoker  . Smokeless tobacco: Never Used  Substance Use Topics  . Alcohol use: No  . Drug use: No    Home Medications Prior to Admission medications   Medication Sig Start Date End Date Taking? Authorizing Provider  acetaminophen (TYLENOL) 325 MG tablet Take 650 mg by mouth every 4 (four) hours as needed for mild pain.    [provider]  albuterol (PROVENTIL) (2.5 MG/3ML) 0.083% nebulizer solution Take 3 mLs (2.5 mg total) by nebulization 2 (two) times daily as needed for wheezing or shortness of breath. 02/21/15   Rinehuls, Kinnie Scalesavid L, PA-C  allopurinol (ZYLOPRIM) 300 MG tablet Take 300 mg by mouth daily. For gout    [provider]  atorvastatin (LIPITOR) 20 MG tablet Take 1 tablet (20 mg total) by mouth daily at 6 PM. 07/02/16   Layne BentonBiby, Sharon L, NP  bisacodyl (DULCOLAX) 10 MG suppository Place 10 mg rectally daily as needed for moderate constipation. x2 days    [provider]  carvedilol (COREG) 12.5 MG tablet Take 12.5 mg by mouth 2 (two) times daily with a meal. Patient takes 12.5 mg at lunch and 12.5 mg at dinner.    [provider]  gabapentin (NEURONTIN) 300 MG capsule Take 300 mg by mouth 3 (three) times daily.    [provider]  geriatric multivitamins-minerals (ELDERTONIC/GEVRABON) ELIX Take 15 mLs by mouth 2 (two) times daily.    [provider]  hydrALAZINE (APRESOLINE) 50 MG tablet Take 50 mg by mouth 4 (four) times daily.     [provider]  lamoTRIgine (LAMICTAL) 150 MG tablet Take 1 tablet (150 mg total) by mouth 2 (two) times daily. 04/23/19   Ihor AustinMcCue, [name], NP  lisinopril (PRINIVIL,ZESTRIL) 20 MG tablet Take 1 tablet (20 mg total) by mouth 2 (two) times daily. 07/06/16   Marvel PlanXu, Jindong, MD  loratadine (CLARITIN) 10 MG tablet Take 10 mg by mouth daily as needed for allergies.    [provider]  metFORMIN (GLUCOPHAGE) 500 MG tablet Take 500 mg by mouth daily.     [provider]  nitroGLYCERIN (NITROSTAT) 0.4 MG SL tablet Place 0.4 mg under the tongue every 5 (five) minutes as needed. Chest pain    [provider]  NUTRITIONAL SUPPLEMENT LIQD Take 120 mLs by mouth 2 (two) times daily. NSA MedPass between breakfast and lunch and lunch and dinner    [provider]  Nutritional Supplements (NUTRITIONAL SUPPLEMENT PO) Take 1 each by mouth daily. Magic Cup at [employer]9AM    [provider]  QUEtiapine (SEROQUEL) 25 MG tablet Take 1 tablet (25 mg total) by mouth at bedtime. 02/21/15   Rinehuls, David L, PA-C  senna-docusate (SENOKOT-S) 8.6-50 MG tablet Take 1 tablet by mouth 2 (two) times daily. 07/02/16   Layne BentonBiby, Sharon L, NP  tamsulosin (FLOMAX) 0.4 MG CAPS capsule Take 0.4 mg by mouth daily.     [provider]    Allergies    Patient  has no known allergies.  Review of Systems   Review of Systems  Constitutional: Positive for fatigue. Negative for chills, decreased appetite, diaphoresis and fever.  HENT: Negative for congestion.   Respiratory: Negative for cough, chest tightness, shortness of breath and wheezing.   Cardiovascular: Negative for palpitations.  Gastrointestinal: Negative for abdominal pain, constipation, diarrhea, nausea and vomiting.  Genitourinary: Positive for decreased urine volume. Negative for frequency.  Musculoskeletal: Negative for back pain.  Neurological: Positive for tremors and seizures. Negative for dizziness, speech difficulty, weakness, light-headedness and headaches.  Psychiatric/Behavioral: Negative for agitation.  All other systems reviewed and are negative.   Physical Exam Updated Vital Signs BP 122/69   Pulse 66   Temp 98.6 F (37 C) (Oral)   Resp 20   Ht 5\' 9"  (1.753 m)   Wt 90.7 kg   SpO2 98%   BMI 29.53 kg/m   Physical Exam Vitals and nursing note reviewed.  Constitutional:       General: He is not in acute distress.    Appearance: He is well-developed. He is not ill-appearing, toxic-appearing or diaphoretic.  HENT:     Head: Normocephalic and atraumatic.     Nose: Nose normal. No congestion or rhinorrhea.     Mouth/Throat:     Mouth: Mucous membranes are moist.  Eyes:     Conjunctiva/sclera: Conjunctivae normal.     Pupils: Pupils are equal, round, and reactive to light.     Comments: Chronic amblyopia  Cardiovascular:     Rate and Rhythm: Normal rate and regular rhythm.     Pulses: Normal pulses.     Heart sounds: Normal heart sounds. No murmur.  Pulmonary:     Effort: Pulmonary effort is normal. No respiratory distress.     Breath sounds: Normal breath sounds. No wheezing, rhonchi or rales.  Chest:     Chest wall: No tenderness.  Abdominal:     General: Abdomen is flat.     Palpations: Abdomen is soft.     Tenderness: There is no abdominal tenderness. There is no right CVA tenderness or left CVA tenderness.  Musculoskeletal:        General: No tenderness.     Cervical back: Neck supple. No tenderness.     Right lower leg: No edema.  Skin:    General: Skin is warm and dry.     Capillary Refill: Capillary refill takes less than 2 seconds.  Neurological:     General: No focal deficit present.     Mental Status: He is alert.  Psychiatric:        Mood and Affect: Mood normal.     ED Results / Procedures / Treatments   Labs (all labs ordered are listed, but only abnormal results are displayed) Labs Reviewed  CBC WITH DIFFERENTIAL/PLATELET - Abnormal; Notable for the following components:      Result Value   RBC 3.40 (*)    Hemoglobin 10.1 (*)    HCT 32.0 (*)    All other components within normal limits  COMPREHENSIVE METABOLIC PANEL - Abnormal; Notable for the following components:   BUN 46 (*)    Creatinine, Ser 2.95 (*)    GFR calc non Af Amer 19 (*)    GFR calc Af Amer 22 (*)    All other components within normal limits  URINE CULTURE   SARS CORONAVIRUS 2 (TAT 6-24 HRS)  MAGNESIUM  LAMOTRIGINE LEVEL  URINALYSIS, ROUTINE W REFLEX MICROSCOPIC  CBG MONITORING, ED  EKG EKG Interpretation  Date/Time:  Tuesday October 02 2019 11:57:23 EST Ventricular Rate:  68 PR Interval:    QRS Duration: 105 QT Interval:  346 QTC Calculation: 368 R Axis:   59 Text Interpretation: Sinus rhythm Anteroseptal infarct, old Nonspecific T abnormalities, inferior leads When compared to prior, no sitgnificant changes seen. No STEMI Confirmed by Theda Belfast ([id number]) on 10/02/2019 12:30:30 PM   Radiology DG Chest 2 View  Result Date: 10/02/2019 CLINICAL DATA:  Altered mental status with apparent seizure EXAM: CHEST - 2 VIEW COMPARISON:  March 12, 2019 FINDINGS: Lungs are clear. Heart size and pulmonary vascularity are normal. No adenopathy. There is aortic atherosclerosis. There is degenerative change in each shoulder. IMPRESSION: No edema or consolidation. Stable cardiac silhouette. Aortic Atherosclerosis (ICD10-I70.0). Electronically Signed   By: Bretta Bang III M.D.   On: 10/02/2019 14:17   CT Head Wo Contrast  Result Date: 10/02/2019 CLINICAL DATA:  Altered mental status with apparent seizure. Confusion. EXAM: CT HEAD WITHOUT CONTRAST TECHNIQUE: Contiguous axial images were obtained from the base of the skull through the vertex without intravenous contrast. COMPARISON:  March 30, 2018 FINDINGS: Brain: Moderate diffuse atrophy is stable. There is no intracranial mass, acute hemorrhage, extra-axial fluid collection, or midline shift. There is evidence of a prior infarct involving portions of the medial and mid left occipital lobe. There is a prior infarct in the right frontal lobe. Calcification along the medial aspect of this prior infarct is stable and may represent residua of previous hemorrhage in this area. There is evidence of a prior smaller infarct at the gray-white junction of the mid right frontal lobe at the level of the  superior aspect of the right lateral ventricle anteriorly. Elsewhere there is patchy small vessel disease in the centra semiovale bilaterally. No acute appearing infarct is evident on this study. Vascular: There is no appreciable hyperdense vessel. There is calcification in each carotid siphon region. Skull: The bony calvarium appears intact. Sinuses/Orbits: There is mucosal thickening in several ethmoid air cells. Other visualized paranasal sinuses are clear. Visualized orbits appear symmetric bilaterally. Other: Mastoid air cells are clear. IMPRESSION: Stable atrophy with multiple prior infarcts and periventricular small vessel disease. Calcification in the right frontal region is stable and likely represents residua from prior hemorrhage. No acute hemorrhage or mass evident. No acute infarct demonstrable. There are foci of arterial vascular calcification. There is mucosal thickening in several ethmoid air cells. Electronically Signed   By: Bretta Bang III M.D.   On: 10/02/2019 14:16    Procedures Procedures (including critical care time)  Medications Ordered in ED Medications  sodium chloride 0.9 % bolus 1,000 mL (1,000 mLs Intravenous New Bag/Given 10/02/19 1450)    ED Course  I have reviewed the triage vital signs and the nursing notes.  Pertinent labs & imaging results that were available during my care of the patient were reviewed by me and considered in my medical decision making (see chart for details).    MDM Rules/Calculators/A&P                      Ascencion Coye is a 84 y.o. male with a past medical history significant for prior stroke, intracranial hemorrhage, seizures, CHF, hyperlipidemia, CAD with prior MI, diabetes, atrial flutter, and hard of hearing who presents with altered mental status and shaking.  According to wife, between 5 AM and 7 AM this morning patient was nearly unresponsive but shaking his right arm rhythmically.  She says  he normally does not have seizures  with shaking and is more a staring off type seizure but she is unsure if he urinated on himself.  He has not had a seizure in months.  She reports he is otherwise been acting normally the last few days.  She reports that for the few hours after this episode he was somewhat confused but that has been improving.  He is now back to his mental status baseline by her report.  Patient denies any memory of what happened and does not member the shaking.  He denies any chest pain, palpitations, or shortness of breath.  He denies any headache, neck pain, neck stiffness.  He denies any new vision changes.  He denies any extremity pains.  He denies any fevers, chills, nausea, vomiting, or other complaints.  No recent trauma.  On exam, patient does not have any rhythmic shaking.  He had normal finger-nose-finger testing.  He was able to answer questions but did have some confusion.  He had normal sensation and strength in legs.  He had disconjugate gaze which is unchanged from his baseline by report.  He had no other abnormalities on exam with clear lungs and nontender chest or abdomen or back.  Given the patient's shaking and unresponsive episode, I am concerned that this may have been either a seizure or other etiology of shaking.  Patient had CT head which showed no acute changes or bleed no new bleeding in his head.  I spoke with neurology who thinks that patient symptoms may have been rhythmic twitching in relation to gabapentin being taken in the setting of acute kidney injury.  No labs are back when we have this discussion.  Patient's labs returned showing he does indeed have AKI with a creatinine of 2.9 up from 1.0.  Neurology recommended no change in the medications but they did agree with an admission to medicine service for observation, rehydration, and monitoring for further shaking or seizures.  It is unclear if the patient just had the shaking from the Neurontin versus an actual seizure however I spoke  with the wife and she agrees with admission for rehydration and monitoring for altered mental status.  Patient will be given fluids.  Medicine team at [facility] will be called for admission.  Final Clinical Impression(s) / ED Diagnoses Final diagnoses:  AKI (acute kidney injury) (HCC)  Muscle twitching    Rx / DC Orders ED Discharge Orders    None     Clinical Impression: 1. AKI (acute kidney injury) (HCC)   2. Muscle twitching     Disposition: Admit  This note was prepared with assistance of Dragon voice recognition software. Occasional wrong-word or sound-a-like substitutions may have occurred due to the inherent limitations of voice recognition software.     Sanora Cunanan, Canary Brim, MD 10/02/19 [phone]

## 2019-10-02 NOTE — ED Notes (Signed)
  Went to assess patient and he was attempted to get out of bed and was disoriented.  Patient was assisted to use the urinal and placed back in the bed.  Cardiac monitor was placed back on patient and he was placed in gown.  Warm blankets given.

## 2019-10-02 NOTE — ED Notes (Signed)
Spoke with EDP, informed of pts agitation, being manic at times. Orders rec

## 2019-10-02 NOTE — ED Notes (Signed)
Pt returned to stretcher, placed back on cont cardiac monitoring with int NBP monitor and cont POX monitoring. SR x 2 up on stretcher. Comfort measures provided. Urinal offered to pt. IV saline locked reassessed. VS obtained.

## 2019-10-02 NOTE — ED Notes (Signed)
Pt states he does not understand where the staff is and why he has not gone to [facility]. Attempted to explain awaiting room assignment. Pt becomes very frustrated when attempting to explain care and current status of beds

## 2019-10-02 NOTE — ED Notes (Signed)
On stretcher, alert and oriented, chart reviewed.

## 2019-10-02 NOTE — ED Notes (Signed)
  Patient attempting to remove cardiac monitor.  Patient was redirected and settled back in bed.  Patient given new blankets and some apple juice to drink.

## 2019-10-02 NOTE — ED Triage Notes (Addendum)
Confused and right arm is "jumping" since 5am. He has a hx of dementia.

## 2019-10-02 NOTE — ED Notes (Signed)
Austin Sawyer @ [phone]

## 2019-10-02 NOTE — ED Notes (Signed)
Cardiac monitor alarming, went to room, pt has removed his cardiac leads, sitting on edge of stretcher, has climbed over rails, pulling off gown and putting own pants. Attempting to pull out IV. Pt is being uncooperative. Attempted to explain care being provided and also explained why there is a delay in being tx to [facility]

## 2019-10-02 NOTE — ED Notes (Signed)
Having urgency and need to void, urinal provided

## 2019-10-02 NOTE — ED Notes (Signed)
Attempted to call wife again. No answer.

## 2019-10-02 NOTE — Progress Notes (Signed)
   Discussed with Dr. Sherry Ruffing regarding patient's rhythmic shaking of right arm - seizure vs neurotin in setting of AKI. CT head unremarkable. Started on IVF. Dr. Sherry Ruffing discussed with Neurology; no recommendations on changes in his antiepileptic medications for now, treat AKI. If recurrent seizure activity, Neuro will consult. Obs at [facility].   RN please call [place] flow manager at [phone] when patient arrives to unit for admission orders.   Dessa Phi, DO [facility] 10/02/2019, 3:12 PM  Available via Epic secure chat 7am-7pm After these hours, please refer to coverage provider listed on [email or web address]

## 2019-10-03 ENCOUNTER — Inpatient Hospital Stay (HOSPITAL_COMMUNITY): Payer: Medicare Other

## 2019-10-03 DIAGNOSIS — I680 Cerebral amyloid angiopathy: Secondary | ICD-10-CM

## 2019-10-03 DIAGNOSIS — I5032 Chronic diastolic (congestive) heart failure: Secondary | ICD-10-CM | POA: Diagnosis present

## 2019-10-03 DIAGNOSIS — Z8673 Personal history of transient ischemic attack (TIA), and cerebral infarction without residual deficits: Secondary | ICD-10-CM | POA: Diagnosis not present

## 2019-10-03 DIAGNOSIS — R531 Weakness: Secondary | ICD-10-CM | POA: Diagnosis present

## 2019-10-03 DIAGNOSIS — I4891 Unspecified atrial fibrillation: Secondary | ICD-10-CM | POA: Diagnosis present

## 2019-10-03 DIAGNOSIS — I10 Essential (primary) hypertension: Secondary | ICD-10-CM

## 2019-10-03 DIAGNOSIS — G4730 Sleep apnea, unspecified: Secondary | ICD-10-CM | POA: Diagnosis present

## 2019-10-03 DIAGNOSIS — E1159 Type 2 diabetes mellitus with other circulatory complications: Secondary | ICD-10-CM

## 2019-10-03 DIAGNOSIS — E785 Hyperlipidemia, unspecified: Secondary | ICD-10-CM

## 2019-10-03 DIAGNOSIS — Z87891 Personal history of nicotine dependence: Secondary | ICD-10-CM | POA: Diagnosis not present

## 2019-10-03 DIAGNOSIS — Z20822 Contact with and (suspected) exposure to covid-19: Secondary | ICD-10-CM | POA: Diagnosis present

## 2019-10-03 DIAGNOSIS — R569 Unspecified convulsions: Secondary | ICD-10-CM | POA: Diagnosis not present

## 2019-10-03 DIAGNOSIS — F0151 Vascular dementia with behavioral disturbance: Secondary | ICD-10-CM | POA: Diagnosis not present

## 2019-10-03 DIAGNOSIS — M109 Gout, unspecified: Secondary | ICD-10-CM

## 2019-10-03 DIAGNOSIS — F1011 Alcohol abuse, in remission: Secondary | ICD-10-CM | POA: Diagnosis present

## 2019-10-03 DIAGNOSIS — G40909 Epilepsy, unspecified, not intractable, without status epilepticus: Secondary | ICD-10-CM | POA: Diagnosis present

## 2019-10-03 DIAGNOSIS — I5189 Other ill-defined heart diseases: Secondary | ICD-10-CM | POA: Diagnosis not present

## 2019-10-03 DIAGNOSIS — N400 Enlarged prostate without lower urinary tract symptoms: Secondary | ICD-10-CM | POA: Diagnosis present

## 2019-10-03 DIAGNOSIS — N179 Acute kidney failure, unspecified: Secondary | ICD-10-CM | POA: Diagnosis present

## 2019-10-03 DIAGNOSIS — H9190 Unspecified hearing loss, unspecified ear: Secondary | ICD-10-CM | POA: Diagnosis present

## 2019-10-03 DIAGNOSIS — E854 Organ-limited amyloidosis: Secondary | ICD-10-CM | POA: Diagnosis present

## 2019-10-03 DIAGNOSIS — I2510 Atherosclerotic heart disease of native coronary artery without angina pectoris: Secondary | ICD-10-CM | POA: Diagnosis present

## 2019-10-03 DIAGNOSIS — I252 Old myocardial infarction: Secondary | ICD-10-CM | POA: Diagnosis not present

## 2019-10-03 DIAGNOSIS — I110 Hypertensive heart disease with heart failure: Secondary | ICD-10-CM | POA: Diagnosis present

## 2019-10-03 DIAGNOSIS — D649 Anemia, unspecified: Secondary | ICD-10-CM | POA: Diagnosis present

## 2019-10-03 DIAGNOSIS — R253 Fasciculation: Secondary | ICD-10-CM | POA: Diagnosis present

## 2019-10-03 DIAGNOSIS — F0150 Vascular dementia without behavioral disturbance: Secondary | ICD-10-CM | POA: Diagnosis present

## 2019-10-03 DIAGNOSIS — E860 Dehydration: Secondary | ICD-10-CM | POA: Diagnosis present

## 2019-10-03 LAB — ABO/RH: ABO/RH(D): B POS

## 2019-10-03 LAB — URINE CULTURE: Culture: <10000 — AB

## 2019-10-03 LAB — BASIC METABOLIC PANEL
Anion gap: 11 (ref 5–15)
BUN: 31 mg/dL — ABNORMAL HIGH (ref 8–23)
CO2: 21 mmol/L — ABNORMAL LOW (ref 22–32)
Calcium: 9.1 mg/dL (ref 8.9–10.3)
Chloride: 108 mmol/L (ref 98–111)
Creatinine, Ser: 1.68 mg/dL — ABNORMAL HIGH (ref 0.61–1.24)
GFR calc Af Amer: 43 mL/min — ABNORMAL LOW (ref >60)
GFR calc non Af Amer: 37 mL/min — ABNORMAL LOW (ref >60)
Glucose, Bld: 103 mg/dL — ABNORMAL HIGH (ref 70–99)
Potassium: 4.6 mmol/L (ref 3.5–5.1)
Sodium: 140 mmol/L (ref 135–145)

## 2019-10-03 LAB — CBC
HCT: 33.9 % — ABNORMAL LOW (ref 39.0–52.0)
Hemoglobin: 11 g/dL — ABNORMAL LOW (ref 13.0–17.0)
MCH: 30.3 pg (ref 26.0–34.0)
MCHC: 32.4 g/dL (ref 30.0–36.0)
MCV: 93.4 fL (ref 80.0–100.0)
Platelets: 153 10*3/uL (ref 150–400)
RBC: 3.63 MIL/uL — ABNORMAL LOW (ref 4.22–5.81)
RDW: 14.6 % (ref 11.5–15.5)
WBC: 10.2 10*3/uL (ref 4.0–10.5)
nRBC: 0 % (ref 0.0–0.2)

## 2019-10-03 LAB — HEMOGLOBIN A1C
Hgb A1c MFr Bld: 5.2 % (ref 4.8–5.6)
Mean Plasma Glucose: 102.54 mg/dL

## 2019-10-03 LAB — TYPE AND SCREEN
ABO/RH(D): B POS
Antibody Screen: NEGATIVE

## 2019-10-03 LAB — GLUCOSE, CAPILLARY: Glucose-Capillary: 89 mg/dL (ref 70–99)

## 2019-10-03 LAB — CK: Total CK: 858 U/L — ABNORMAL HIGH (ref 49–397)

## 2019-10-03 LAB — SODIUM, URINE, RANDOM: Sodium, Ur: 115 mmol/L

## 2019-10-03 LAB — LAMOTRIGINE LEVEL: Lamotrigine Lvl: 5.9 ug/mL (ref 2.0–20.0)

## 2019-10-03 LAB — CREATININE, URINE, RANDOM: Creatinine, Urine: 50.04 mg/dL

## 2019-10-03 LAB — SARS CORONAVIRUS 2 (TAT 6-24 HRS): SARS Coronavirus 2: NEGATIVE

## 2019-10-03 MED ORDER — ACETAMINOPHEN 650 MG RE SUPP: 650.0000 mg | Freq: Four times a day (QID) | RECTAL | Status: DC | PRN

## 2019-10-03 MED ORDER — TAMSULOSIN HCL 0.4 MG PO CAPS
0.4000 mg | ORAL_CAPSULE | Freq: Every day | ORAL | Status: DC
  Administered 2019-10-03 – 2019-10-05 (×3): 0.4 mg via ORAL
  Filled 2019-10-03 (×3): qty 1

## 2019-10-03 MED ORDER — BISACODYL 10 MG RE SUPP: 10.0000 mg | Freq: Every day | RECTAL | Status: DC | PRN

## 2019-10-03 MED ORDER — ACETAMINOPHEN 325 MG PO TABS: 650.0000 mg | ORAL_TABLET | Freq: Four times a day (QID) | ORAL | Status: DC | PRN

## 2019-10-03 MED ORDER — SODIUM CHLORIDE 0.9% FLUSH
3.0000 mL | Freq: Two times a day (BID) | INTRAVENOUS | Status: DC
  Administered 2019-10-03 – 2019-10-05 (×4): 3 mL via INTRAVENOUS

## 2019-10-03 MED ORDER — ONDANSETRON HCL 4 MG/2ML IJ SOLN: 4.0000 mg | Freq: Four times a day (QID) | INTRAMUSCULAR | Status: DC | PRN

## 2019-10-03 MED ORDER — GABAPENTIN 300 MG PO CAPS
300.0000 mg | ORAL_CAPSULE | Freq: Three times a day (TID) | ORAL | Status: DC
  Administered 2019-10-03 – 2019-10-05 (×5): 300 mg via ORAL
  Filled 2019-10-03 (×5): qty 1

## 2019-10-03 MED ORDER — ENOXAPARIN SODIUM 40 MG/0.4ML ~~LOC~~ SOLN
40.0000 mg | SUBCUTANEOUS | Status: DC
  Filled 2019-10-03: qty 0.4

## 2019-10-03 MED ORDER — SODIUM CHLORIDE 0.9 % IV SOLN: INTRAVENOUS | Status: DC

## 2019-10-03 MED ORDER — HYDRALAZINE HCL 50 MG PO TABS
50.0000 mg | ORAL_TABLET | Freq: Four times a day (QID) | ORAL | Status: DC
  Administered 2019-10-03 – 2019-10-05 (×9): 50 mg via ORAL
  Filled 2019-10-03 (×9): qty 1

## 2019-10-03 MED ORDER — ONDANSETRON HCL 4 MG PO TABS: 4.0000 mg | ORAL_TABLET | Freq: Four times a day (QID) | ORAL | Status: DC | PRN

## 2019-10-03 MED ORDER — LAMOTRIGINE 150 MG PO TABS
150.0000 mg | ORAL_TABLET | Freq: Two times a day (BID) | ORAL | Status: DC
  Administered 2019-10-03 – 2019-10-05 (×5): 150 mg via ORAL
  Filled 2019-10-03 (×7): qty 1

## 2019-10-03 MED ORDER — CARVEDILOL 12.5 MG PO TABS
12.5000 mg | ORAL_TABLET | Freq: Two times a day (BID) | ORAL | Status: DC
  Administered 2019-10-03 – 2019-10-05 (×5): 12.5 mg via ORAL
  Filled 2019-10-03 (×6): qty 1

## 2019-10-03 MED ORDER — ALBUTEROL SULFATE (2.5 MG/3ML) 0.083% IN NEBU: 2.5000 mg | INHALATION_SOLUTION | Freq: Four times a day (QID) | RESPIRATORY_TRACT | Status: DC | PRN

## 2019-10-03 MED ORDER — ATORVASTATIN CALCIUM 10 MG PO TABS
20.0000 mg | ORAL_TABLET | Freq: Every day | ORAL | Status: DC
  Administered 2019-10-03 – 2019-10-04 (×2): 20 mg via ORAL
  Filled 2019-10-03 (×2): qty 2

## 2019-10-03 MED ORDER — QUETIAPINE FUMARATE 25 MG PO TABS
25.0000 mg | ORAL_TABLET | Freq: Every day | ORAL | Status: DC
  Administered 2019-10-03 – 2019-10-04 (×2): 25 mg via ORAL
  Filled 2019-10-03 (×2): qty 1

## 2019-10-03 NOTE — H&P (Addendum)
History and Physical    Austin Sawyer EYC:[id number] DOB: 1935-07-22 DOA: 10/02/2019  Referring MD/NP/PA: Dessa Phi, MD PCP: System, Pcp Not In  Patient coming from: Transfer from [facility]  Chief Complaint: Right arm jerking  I have personally briefly reviewed patient's old medical records in [place]   HPI: Austin Sawyer is a 84 y.o. male with medical history significant of CHF, HLD, CAD,  diastolic dysfunction, diabetes mellitus type 2, CVA, ICH, dementia, and seizures.  He presents after having right arm jerking yesterday.  At baseline patient dementia, lives at home with his wife, ambulates with the use of a cane, and has nursing care that comes in 7 days a week.  History is obtained from the patient's wife due to his history of dementia, he is very hard of hearing, and currently does not have his hearing aids in.  His wife reports that when the nurse came to see him yesterday and his right arm was jerking kind of like he was having muscle spasms.  Due to the symptoms he was unable to eat and they reported that he just did not look right or sound like himself.  Symptoms persisted for 3 hours then thereafter they called EMS where patient was taken to [facility].  His wife notes that his last seizure was in June of this year.  Otherwise he had been in his normal state of health and had been eating and drinking like normal.   ED Course: Upon admission into the emergency department patient was seen to be afebrile, pulse elevated up to 128, blood pressures 95/52-183/85, and all other vital signs relatively within normal limits.  CT scan of the brain did not note any acute infarct or signs of hemorrhage.  Labs significant for hemoglobin 10.1(baseline previously 12-13), BUN 46, and creatinine 2.95 (baseline 1). Urinalysis was negative.  COVID-19 screening was negative.  Patient was given medications of Coreg, hydralazine, Lamictal 150 mg, Seroquel 25 mg, Haldol 2 mg IM, and 1L of normal saline IV fluids.   She was accepted to a medical telemetry bed for observation.  Review of Systems  Constitutional: Negative for malaise/fatigue.  HENT: Negative for ear discharge and nosebleeds.   Eyes: Negative for double vision and photophobia.  Respiratory: Negative for cough and shortness of breath.   Cardiovascular: Negative for chest pain and leg swelling.  Gastrointestinal: Negative for diarrhea, nausea and vomiting.  Genitourinary: Negative for dysuria and hematuria.  Musculoskeletal: Negative for falls.  Neurological: Positive for tremors and seizures. Negative for loss of consciousness.  Psychiatric/Behavioral: Positive for memory loss. Negative for substance abuse.    Past Medical History:  Diagnosis Date  . Alcohol abuse, in remission   . Atrial flutter ([place])   . Back pain   . Cervicalgia   . Coronary artery disease   . Coronary atherosclerosis of unspecified type of vessel, native or graft   . Dementia ([place])   . Depression   . Diabetes mellitus without complication ([place])   . Disturbance of skin sensation   . Hypertension   . MI, old   . Reflux   . Seizures ([place])   . Stroke ([place])   . Unspecified hearing loss   . Unspecified sleep apnea     Past Surgical History:  Procedure Laterality Date  . HERNIA REPAIR    . IR GENERIC HISTORICAL  07/16/2016   IR [place] TUBE PERCUT W/FLUORO 07/16/2016 Aletta Edouard, MD [facility]-INTERV RAD  . IR GENERIC HISTORICAL  09/07/2016  IR GASTROSTOMY TUBE REMOVAL 09/07/2016 [facility]-INTERV RAD  . PEG PLACEMENT N/A 07/02/2016   Procedure: PERCUTANEOUS ENDOSCOPIC GASTROSTOMY (PEG) PLACEMENT;  Surgeon: Violeta Gelinas, MD;  Location: [facility] ENDOSCOPY;  Service: Endoscopy;  Laterality: N/A;  . ROTATOR CUFF REPAIR       reports that he has quit smoking. He has never used smokeless tobacco. He reports that he does not drink alcohol or use drugs.  No Known Allergies  Family History  Problem Relation Age of Onset  . Anxiety disorder Mother   . Cancer  Mother     Prior to Admission medications   Medication Sig Start Date End Date Taking? Authorizing Provider  acetaminophen (TYLENOL) 325 MG tablet Take 650 mg by mouth every 4 (four) hours as needed for mild pain.    [provider]  albuterol (PROVENTIL) (2.5 MG/3ML) 0.083% nebulizer solution Take 3 mLs (2.5 mg total) by nebulization 2 (two) times daily as needed for wheezing or shortness of breath. 02/21/15   Rinehuls, Kinnie Scales, PA-C  allopurinol (ZYLOPRIM) 300 MG tablet Take 300 mg by mouth daily. For gout    [provider]  atorvastatin (LIPITOR) 20 MG tablet Take 1 tablet (20 mg total) by mouth daily at 6 PM. 07/02/16   Layne Benton, NP  bisacodyl (DULCOLAX) 10 MG suppository Place 10 mg rectally daily as needed for moderate constipation. x2 days    [provider]  carvedilol (COREG) 12.5 MG tablet Take 12.5 mg by mouth 2 (two) times daily with a meal. Patient takes 12.5 mg at lunch and 12.5 mg at dinner.    [provider]  gabapentin (NEURONTIN) 300 MG capsule Take 300 mg by mouth 3 (three) times daily.    [provider]  geriatric multivitamins-minerals (ELDERTONIC/GEVRABON) ELIX Take 15 mLs by mouth 2 (two) times daily.    [provider]  hydrALAZINE (APRESOLINE) 50 MG tablet Take 50 mg by mouth 4 (four) times daily.     [provider]  lamoTRIgine (LAMICTAL) 150 MG tablet Take 1 tablet (150 mg total) by mouth 2 (two) times daily. 04/23/19   Ihor Austin, NP  lisinopril (PRINIVIL,ZESTRIL) 20 MG tablet Take 1 tablet (20 mg total) by mouth 2 (two) times daily. 07/06/16   Marvel Plan, MD  loratadine (CLARITIN) 10 MG tablet Take 10 mg by mouth daily as needed for allergies.    [provider]  metFORMIN (GLUCOPHAGE) 500 MG tablet Take 500 mg by mouth daily.     [provider]  nitroGLYCERIN (NITROSTAT) 0.4 MG SL tablet Place 0.4 mg under the tongue every 5 (five) minutes as needed. Chest pain    [provider]  NUTRITIONAL SUPPLEMENT LIQD Take 120 mLs by mouth 2 (two) times daily. NSA MedPass between breakfast and lunch and lunch and dinner    [provider]  Nutritional Supplements (NUTRITIONAL SUPPLEMENT PO) Take 1 each by mouth daily. Magic Cup at [employer], Historical, MD  QUEtiapine (SEROQUEL) 25 MG tablet Take 1 tablet (25 mg total) by mouth at bedtime. 02/21/15   Rinehuls, David L, PA-C  senna-docusate (SENOKOT-S) 8.6-50 MG tablet Take 1 tablet by mouth 2 (two) times daily. 07/02/16   Layne Benton, NP  tamsulosin (FLOMAX) 0.4 MG CAPS capsule Take 0.4 mg by mouth daily.     [provider]    Physical Exam:  Constitutional: Elderly male in NAD, calm, comfortable Vitals:   10/03/19 0400 10/03/19 0500 10/03/19 0545 10/03/19 0646  BP: Marland Kitchen)  156/85 (!) 165/85  (!) 167/90  Pulse: 76 80 79 77  Resp: Temp:    98.4 F (36.9 C)  TempSrc:      SpO2: 100% 99% 100% 100%  Weight:    79.8 kg  Height:    5' 9.02" (1.753 m)   Eyes: PERRL, lids and conjunctivae normal ENMT: Mucous membranes are moist. Posterior pharynx clear of any exudate or lesions.  Very hard of hearing. Neck: normal, supple, no masses, no thyromegaly Respiratory: clear to auscultation bilaterally, no wheezing, no crackles. Normal respiratory effort. No accessory muscle use.  Cardiovascular: Regular rate and rhythm, 2/6 systolic murmur present. No extremity edema. 2+ pedal pulses. No carotid bruits.  Abdomen: no tenderness, no masses palpated. No hepatosplenomegaly. Bowel sounds positive.  Genitourinary: Condom cath present with yellow urine present. Musculoskeletal: no clubbing / cyanosis. No joint deformity upper and lower extremities. Good ROM, no contractures. Normal muscle tone.  Skin: no rashes, lesions, ulcers. No induration Neurologic: CN 2-12 grossly intact. Sensation intact, DTR normal. Strength 5/5 in all 4.  Psychiatric: Poor memory.  Alert and oriented to person and  place. Normal mood.     Labs on Admission: I have personally reviewed following labs and imaging studies  CBC: Recent Labs  Lab 10/02/19 1234  WBC 10.0  NEUTROABS 7.7  HGB 10.1*  HCT 32.0*  MCV 94.1  PLT 173   Basic Metabolic Panel: Recent Labs  Lab 10/02/19 1234  NA 136  K 4.5  CL 101  CO2 27  GLUCOSE 92  BUN 46*  CREATININE 2.95*  CALCIUM 9.1  MG 2.1   GFR: Estimated Creatinine Clearance: 19.3 mL/min (A) (by C-G formula based on SCr of 2.95 mg/dL (H)). Liver Function Tests: Recent Labs  Lab 10/02/19 1234  AST 16  ALT 13  ALKPHOS 60  BILITOT 0.6  PROT 7.3  ALBUMIN 4.0   No results for input(s): LIPASE, AMYLASE in the last 168 hours. No results for input(s): AMMONIA in the last 168 hours. Coagulation Profile: No results for input(s): INR, PROTIME in the last 168 hours. Cardiac Enzymes: No results for input(s): CKTOTAL, CKMB, CKMBINDEX, TROPONINI in the last 168 hours. BNP (last 3 results) No results for input(s): PROBNP in the last 8760 hours. HbA1C: No results for input(s): HGBA1C in the last 72 hours. CBG: Recent Labs  Lab 10/02/19 1151 10/03/19 0732  GLUCAP 98 89   Lipid Profile: No results for input(s): CHOL, HDL, LDLCALC, TRIG, CHOLHDL, LDLDIRECT in the last 72 hours. Thyroid Function Tests: No results for input(s): TSH, T4TOTAL, FREET4, T3FREE, THYROIDAB in the last 72 hours. Anemia Panel: No results for input(s): VITAMINB12, FOLATE, FERRITIN, TIBC, IRON, RETICCTPCT in the last 72 hours. Urine analysis:    Component Value Date/Time   COLORURINE YELLOW 10/02/2019 1251   APPEARANCEUR CLEAR 10/02/2019 1251   LABSPEC 1.010 10/02/2019 1251   PHURINE 5.5 10/02/2019 1251   GLUCOSEU NEGATIVE 10/02/2019 1251   HGBUR NEGATIVE 10/02/2019 1251   BILIRUBINUR NEGATIVE 10/02/2019 1251   KETONESUR NEGATIVE 10/02/2019 1251   PROTEINUR NEGATIVE 10/02/2019 1251   UROBILINOGEN 0.2 06/03/2015 1240   NITRITE NEGATIVE 10/02/2019 1251   LEUKOCYTESUR  NEGATIVE 10/02/2019 1251   Sepsis Labs: Recent Results (from the past 240 hour(s))  SARS CORONAVIRUS 2 (TAT 6-24 HRS) Nasopharyngeal Nasopharyngeal Swab     Status: None   Collection Time: 10/02/19  2:45 PM   Specimen: Nasopharyngeal Swab  Result Value Ref Range Status   SARS Coronavirus 2  NEGATIVE NEGATIVE Final    Comment: (NOTE) SARS-CoV-2 target nucleic acids are NOT DETECTED. The SARS-CoV-2 RNA is generally detectable in upper and lower respiratory specimens during the acute phase of infection. Negative results do not preclude SARS-CoV-2 infection, do not rule out co-infections with other pathogens, and should not be used as the sole basis for treatment or other patient management decisions. Negative results must be combined with clinical observations, patient history, and epidemiological information. The expected result is Negative. Fact Sheet for Patients: HairSlick.nohttps://www.fda.gov/media/138098/download Fact Sheet for Healthcare Providers: quierodirigir.comhttps://www.fda.gov/media/138095/download This test is not yet approved or cleared by the [place] FDA and  has been authorized for detection and/or diagnosis of SARS-CoV-2 by FDA under an Emergency Use Authorization (EUA). This EUA will remain  in effect (meaning this test can be used) for the duration of the COVID-19 declaration under Section 56 4(b)(1) of the Act, 21 U.S.C. section 360bbb-3(b)(1), unless the authorization is terminated or revoked sooner. Performed at [facility], [address] [place], [place] [address]      Radiological Exams on Admission: DG Chest 2 View  Result Date: 10/02/2019 CLINICAL DATA:  Altered mental status with apparent seizure EXAM: CHEST - 2 VIEW COMPARISON:  March 12, 2019 FINDINGS: Lungs are clear. Heart size and pulmonary vascularity are normal. No adenopathy. There is aortic atherosclerosis. There is degenerative change in each shoulder. IMPRESSION: No edema or consolidation. Stable cardiac  silhouette. Aortic Atherosclerosis (ICD10-I70.0). Electronically Signed   By: Bretta BangWilliam  Woodruff III M.D.   On: 10/02/2019 14:17   CT Head Wo Contrast  Result Date: 10/02/2019 CLINICAL DATA:  Altered mental status with apparent seizure. Confusion. EXAM: CT HEAD WITHOUT CONTRAST TECHNIQUE: Contiguous axial images were obtained from the base of the skull through the vertex without intravenous contrast. COMPARISON:  March 30, 2018 FINDINGS: Brain: Moderate diffuse atrophy is stable. There is no intracranial mass, acute hemorrhage, extra-axial fluid collection, or midline shift. There is evidence of a prior infarct involving portions of the medial and mid left occipital lobe. There is a prior infarct in the right frontal lobe. Calcification along the medial aspect of this prior infarct is stable and may represent residua of previous hemorrhage in this area. There is evidence of a prior smaller infarct at the gray-white junction of the mid right frontal lobe at the level of the superior aspect of the right lateral ventricle anteriorly. Elsewhere there is patchy small vessel disease in the centra semiovale bilaterally. No acute appearing infarct is evident on this study. Vascular: There is no appreciable hyperdense vessel. There is calcification in each carotid siphon region. Skull: The bony calvarium appears intact. Sinuses/Orbits: There is mucosal thickening in several ethmoid air cells. Other visualized paranasal sinuses are clear. Visualized orbits appear symmetric bilaterally. Other: Mastoid air cells are clear. IMPRESSION: Stable atrophy with multiple prior infarcts and periventricular small vessel disease. Calcification in the right frontal region is stable and likely represents residua from prior hemorrhage. No acute hemorrhage or mass evident. No acute infarct demonstrable. There are foci of arterial vascular calcification. There is mucosal thickening in several ethmoid air cells. Electronically Signed   By:  Bretta BangWilliam  Woodruff III M.D.   On: 10/02/2019 14:16    EKG: Independently reviewed.  Normal sinus rhythm at 68 bpm  Assessment/Plan Acute renal failure: Patient presents with creatinine elevated up to 2.95 with BUN 46.  His baseline creatinine had been 1.08 in June of this year.  Patient had been given 1 L of normal saline  IV fluids and then placed on rate 125 mL/h.  Question possibility of rhabdomyolysis as cause of symptoms. -Admit to a medical telemetry bed -Strict intake and output -Check renal ultrasound -Check urine creatinine and urine sodium  -Check CK -Continue normal saline at 125 mL/h as tolerated -Hold nephrotoxic agents  Right arm twitching suspected secondary to seizure disorder: Patient presents with history of 3 hours of right arm twitching.  Unclear if symptoms secondary to a seizure.  Patient follows with Dr. Roda Shutters of neurology in outpatient setting. -Seizure precautions -Monitor for seizure activity -Continue Lamictal and gabapentin if kidney function improving -Dr. Roda Shutters notified and will see the patient  Normocytic normochromic anemia: Acute on chronic.  Patient's baseline hemoglobin had been in the 12-13 range, but he presented yesterday with hemoglobin of 10.1.  With elevated BUN creatinine ratio question possibility of active bleed. -Type and screen -Continue to monitor H&H -Transfuse blood products as needed  Essential hypertension: On admission blood pressures elevated up to 183/85.  Home medication regimen includes Coreg 12.5 mg twice daily, hydralazine 50 mg 4 times daily, and lisinopril 20 mg twice daily. -Hold lisinopril due to AKI -Continue Coreg and hydralazine  History of CVA and ICH: Patient with residual weakness and memory deficits per the patient's son. -Continue statin  Diabetes mellitus type 2: Patient appears to be well controlled on Metformin.  Glucose ninety-two on admission, and last hemoglobin A1c noted to be 6.4 in 07/2016. -Hypoglycemic  protocol -Check hemoglobin A1c -Hold Metformin -Carb modified diet for now -Continue to monitor and placed on sliding scale of insulin if needed  Diastolic dysfunction:  Patient does not appear to be acutely fluid overloaded at this time. Last EF noted to be 60 to 65% with grade 1 diastolic dysfunction and 2017. -Continue to monitor for signs of fluid overload   Hyperlipidemia -Continue atorvastatin  Hard of hearing: Patient does not have hearing and then place currently  BPH -Follow-up renal ultrasound -Continue Flomax  History of gout -Continue allopurinol   DVT prophylaxis: SCDs, but we will start Lovenox if H&H stable and kidney function improved Code Status: Full Family Communication: Discussed plan of care with the patient son who was present in the room and called wife over the phone. Disposition Plan: Possible discharge home on 2 to 3 days  Consults called: None Admission status: Inpatient  Clydie Braun MD [facility] Pager [phone]   If 7PM-7AM, please contact night-coverage [email or web address] Password [facility]  10/03/2019, 7:39 AM

## 2019-10-03 NOTE — Consult Note (Signed)
Neurology Consultation Note  Consult Requested by: Dr. Tamala Julian  Reason for Consult: seizure  Consult Date: 10/03/19  History of Present Illness:  Austin Sawyer is a 84 y.o. African American male with PMH of HTN, DM, CAD, atrial flutter on Coumadin before until 2010, recurrent ICHs due to CAA, seizure and dementia admitted right arm shaking jerking.   Pt can not provide history, so the history mainly collected from his chart. His wife reports that when the home nurse visited him yesterday, he was found to have jerking and shaking movement.  With the symptoms he was unable to eat and did not look right.  Symptoms lasted for 3 hours and resolved.  for this reason, he was sent to [facility] for evaluation. During ER and admission, he was found to have AKI with Cre 2.95 and received IVF and Cre today down to 1.68. Pt seems back to his baseline. Neurology was called for seizure management.    Pt was admitted on 02/08/15 for left occipital ICH with SAH, SDH and IVH. Recovered well with no need of surgical intervention. Stroke work up largely negative except LDL 107. Etiology still most likely HTN vs. CAA at that time. Pt continued to have significant language deficit and hemianopia. On 06/24/16, he was admitted for right frontal ICH concerning for CAA given recurrent lobar ICHs. EEG no seizure, EF 60-65%. No antiplatelet was recommended on discharge. He then had new onset seizure GTC on 02/21/17. Repeat EEG showed mild diffuse slowing and no seizure. He had another GTC in 04/2017 and was started on gabapentin 300mg  tid and lamictal with titration. Had a mild seizure in 06/2017 during the titrating of lamictal. Family also reported seizure like activity 11/2017, 02/2018 and 10/05/2018, but description also concerning for syncope.   He had gradual decline of memory and cognition impairment, likely due to vascular dementia. MMSE in 11/2015 was 6/30. On seroquel Qhs for sundowning. Currently lives at home with his wife, ambulates with  the use of a cane, and has nursing care that comes in 7 days a week.   Has hx of afib on coumadin but in 2011 Dr. Percival Spanish considered his Aflutter was transient and cured, and he was then off coumadin.   Past Medical History:  Diagnosis Date  . Alcohol abuse, in remission   . Atrial flutter ([place])   . Back pain   . Cervicalgia   . Coronary artery disease   . Coronary atherosclerosis of unspecified type of vessel, native or graft   . Dementia ([place])   . Depression   . Diabetes mellitus without complication ([place])   . Disturbance of skin sensation   . Hypertension   . MI, old   . Reflux   . Seizures ([place])   . Stroke ([place])   . Unspecified hearing loss   . Unspecified sleep apnea     Past Surgical History:  Procedure Laterality Date  . HERNIA REPAIR    . IR GENERIC HISTORICAL  07/16/2016   IR [place] TUBE PERCUT W/FLUORO 07/16/2016 Aletta Edouard, MD [facility]-INTERV RAD  . IR GENERIC HISTORICAL  09/07/2016   IR GASTROSTOMY TUBE REMOVAL 09/07/2016 [facility]-INTERV RAD  . PEG PLACEMENT N/A 07/02/2016   Procedure: PERCUTANEOUS ENDOSCOPIC GASTROSTOMY (PEG) PLACEMENT;  Surgeon: Georganna Skeans, MD;  Location: [facility];  Service: Endoscopy;  Laterality: N/A;  . ROTATOR CUFF REPAIR      Family History  Problem Relation Age of Onset  . Anxiety disorder Mother   . Cancer Mother  Social History:  reports that he has quit smoking. He has never used smokeless tobacco. He reports that he does not drink alcohol or use drugs.  Allergies: No Known Allergies  No current facility-administered medications on file prior to encounter.   Current Outpatient Medications on File Prior to Encounter  Medication Sig Dispense Refill  . carvedilol (COREG) 12.5 MG tablet Take 12.5 mg by mouth 2 (two) times daily with a meal. Patient takes 12.5 mg at lunch and 12.5 mg at dinner.    Marland Kitchen. acetaminophen (TYLENOL) 325 MG tablet Take 650 mg by mouth every 4 (four) hours as needed for mild pain.    Marland Kitchen.  albuterol (PROVENTIL) (2.5 MG/3ML) 0.083% nebulizer solution Take 3 mLs (2.5 mg total) by nebulization 2 (two) times daily as needed for wheezing or shortness of breath. 75 mL 12  . allopurinol (ZYLOPRIM) 300 MG tablet Take 300 mg by mouth daily. For gout    . atorvastatin (LIPITOR) 20 MG tablet Take 1 tablet (20 mg total) by mouth daily at 6 PM.    . bisacodyl (DULCOLAX) 10 MG suppository Place 10 mg rectally daily as needed for moderate constipation. x2 days    . gabapentin (NEURONTIN) 300 MG capsule Take 300 mg by mouth 3 (three) times daily.    Marland Kitchen. geriatric multivitamins-minerals (ELDERTONIC/GEVRABON) ELIX Take 15 mLs by mouth 2 (two) times daily.    . hydrALAZINE (APRESOLINE) 50 MG tablet Take 50 mg by mouth 4 (four) times daily.     Marland Kitchen. lamoTRIgine (LAMICTAL) 150 MG tablet Take 1 tablet (150 mg total) by mouth 2 (two) times daily. 180 tablet 3  . lisinopril (PRINIVIL,ZESTRIL) 20 MG tablet Take 1 tablet (20 mg total) by mouth 2 (two) times daily. 60 tablet 1  . loratadine (CLARITIN) 10 MG tablet Take 10 mg by mouth daily as needed for allergies.    . metFORMIN (GLUCOPHAGE) 500 MG tablet Take 500 mg by mouth daily.     . nitroGLYCERIN (NITROSTAT) 0.4 MG SL tablet Place 0.4 mg under the tongue every 5 (five) minutes as needed. Chest pain    . NUTRITIONAL SUPPLEMENT LIQD Take 120 mLs by mouth 2 (two) times daily. NSA MedPass between breakfast and lunch and lunch and dinner    . Nutritional Supplements (NUTRITIONAL SUPPLEMENT PO) Take 1 each by mouth daily. Magic Cup at [employer]9AM    . QUEtiapine (SEROQUEL) 25 MG tablet Take 1 tablet (25 mg total) by mouth at bedtime. 30 tablet 1  . senna-docusate (SENOKOT-S) 8.6-50 MG tablet Take 1 tablet by mouth 2 (two) times daily.    . tamsulosin (FLOMAX) 0.4 MG CAPS capsule Take 0.4 mg by mouth daily.       Review of Systems: A full ROS was attempted today and was not able to be performed.    Physical Examination: Temp:  [98.1 F (36.7 C)-98.4 F (36.9 C)] 98.1  F (36.7 C) (12/30 0740) Pulse Rate:  [63-80] 75 (12/30 1542) Resp:  [12-17] 16 (12/30 1542) BP: (128-183)/(67-98) 181/92 (12/30 1542) SpO2:  [94 %-100 %] 100 % (12/30 0740) Weight:  [79.8 kg] 79.8 kg (12/30 0646)  General - well nourished, well developed, in no apparent distress.    Ophthalmologic - fundi not visualized due to noncooperation.    Cardiovascular - regular rhythm and rate  Mental Status -  Pleasantly demented, not orientated to place, time or people. Fluent language, but content was incoherent, very distractible, with occasional "word salad". Anomia, naming 0/4. Able to repeat simple sentences  Cranial Nerves II - XII - II - blinking to visual threat bilaterally. III, IV, VI - Extraocular movements intact on the right. Left eye INO. V - Facial sensation intact bilaterally. VII - mild right facial droop. VIII - Hard of hearing. X - Palate elevates symmetrically. XI - Chin turning & shoulder shrug intact bilaterally. XII - Tongue protrusion intact.  Motor Strength - The patient's strength was normal in RUE, however LUE proximal and distal 4/5. BLE 4/5.    Motor Tone & Bulk - Muscle tone was assessed at the neck and appendages and was normal.  Bulk was normal and fasciculations were absent.   Reflexes - The patient's reflexes were 3+ in all extremities and he had no pathological reflexes.  Sensory - Light touch, temperature/pinprick were assessed and were normal.    Coordination - The patient had normal movements in the hands with no ataxia or dysmetria.  Tremor was absent.  Gait and Station - deferred  Data Reviewed: DG Chest 2 View  Result Date: 10/02/2019 CLINICAL DATA:  Altered mental status with apparent seizure EXAM: CHEST - 2 VIEW COMPARISON:  March 12, 2019 FINDINGS: Lungs are clear. Heart size and pulmonary vascularity are normal. No adenopathy. There is aortic atherosclerosis. There is degenerative change in each shoulder. IMPRESSION: No edema or  consolidation. Stable cardiac silhouette. Aortic Atherosclerosis (ICD10-I70.0). Electronically Signed   By: Bretta Bang III M.D.   On: 10/02/2019 14:17   CT Head Wo Contrast  Result Date: 10/02/2019 CLINICAL DATA:  Altered mental status with apparent seizure. Confusion. EXAM: CT HEAD WITHOUT CONTRAST TECHNIQUE: Contiguous axial images were obtained from the base of the skull through the vertex without intravenous contrast. COMPARISON:  March 30, 2018 FINDINGS: Brain: Moderate diffuse atrophy is stable. There is no intracranial mass, acute hemorrhage, extra-axial fluid collection, or midline shift. There is evidence of a prior infarct involving portions of the medial and mid left occipital lobe. There is a prior infarct in the right frontal lobe. Calcification along the medial aspect of this prior infarct is stable and may represent residua of previous hemorrhage in this area. There is evidence of a prior smaller infarct at the gray-white junction of the mid right frontal lobe at the level of the superior aspect of the right lateral ventricle anteriorly. Elsewhere there is patchy small vessel disease in the centra semiovale bilaterally. No acute appearing infarct is evident on this study. Vascular: There is no appreciable hyperdense vessel. There is calcification in each carotid siphon region. Skull: The bony calvarium appears intact. Sinuses/Orbits: There is mucosal thickening in several ethmoid air cells. Other visualized paranasal sinuses are clear. Visualized orbits appear symmetric bilaterally. Other: Mastoid air cells are clear. IMPRESSION: Stable atrophy with multiple prior infarcts and periventricular small vessel disease. Calcification in the right frontal region is stable and likely represents residua from prior hemorrhage. No acute hemorrhage or mass evident. No acute infarct demonstrable. There are foci of arterial vascular calcification. There is mucosal thickening in several ethmoid air cells.  Electronically Signed   By: Bretta Bang III M.D.   On: 10/02/2019 14:16   US RENAL  Result Date: 10/03/2019 CLINICAL DATA:  Acute renal failure. EXAM: RENAL / URINARY TRACT ULTRASOUND COMPLETE COMPARISON:  Abdominal ultrasound dated May 22, 2012. FINDINGS: Right Kidney: Renal measurements: 10.3 x 4.6 x 5.5 cm = volume: 135 mL . Echogenicity within normal limits. No mass or hydronephrosis visualized. Two simple cysts measuring up to 1.7 cm. Left Kidney: Renal measurements: 10.7 x  5.7 x 5.1 cm = volume: 162 mL. Echogenicity within normal limits. No mass or hydronephrosis visualized. Two simple cysts measuring up to 2.7 cm. Bladder: Appears normal for degree of bladder distention. Other: None. IMPRESSION: 1. No acute abnormality. 2. Bilateral renal simple cysts. Electronically Signed   By: Obie Dredge M.D.   On: 10/03/2019 10:09    Assessment: 84 y.o. male PMH of HTN, DM, CAD, atrial flutter on Coumadin before until 2010, recurrent ICHs due to CAA, seizure and dementia admitted right arm shaking jerking in the setting of AKI. Pt had hx of recurrent ICHs due to CAA as well as seizure, on gabapentin 300mg  tid and lamictal 150mg  bid. Seizure controlled relatively well for the last two years. The current breakthrough seizure most likely related to AKI which lowered seizure threshold. UA negative. AKI now much improved and pt seems at his baseline. Do not think EEG will needed at this time. Will continue his current AEDs and continue to treat his underlying condition this time.  Plan: - continue to treat underlying AKI as per primary team - breakthrough seizure likely related to AKI - now at baseline, EEG not needed at this time - will continue his gabapentin and lamictal at this time - given CAA, BP goal < 140. Now on coreg and hydralazine, BP fluctuate. Due to AKI, lisinopril and HCTZ were discontinued. Would recommend alternatives for better BP control - avoid antiplatelet due to CAA - pt  has appointment with [facility] stroke clinic on 10/24/19, please keep the appointment.  Thank you for this consultation and allowing to participate in the care of this patient. Neurology will sign off, please call with questions.  10/26/19, MD PhD Stroke Neurology 10/03/2019 6:04 PM

## 2019-10-03 NOTE — ED Notes (Signed)
  Patient readjusted in bed with full linen change.  Patient was also given a condom catheter to assist with urination.  Patient states he is comfortable and in no pain.  Assisted by Edison Nasuti EMT

## 2019-10-04 LAB — CBC WITH DIFFERENTIAL/PLATELET
Abs Immature Granulocytes: 0.05 10*3/uL (ref 0.00–0.07)
Basophils Absolute: 0.1 10*3/uL (ref 0.0–0.1)
Basophils Relative: 1 %
Eosinophils Absolute: 0.4 10*3/uL (ref 0.0–0.5)
Eosinophils Relative: 3 %
HCT: 35.1 % — ABNORMAL LOW (ref 39.0–52.0)
Hemoglobin: 11.4 g/dL — ABNORMAL LOW (ref 13.0–17.0)
Immature Granulocytes: 0 %
Lymphocytes Relative: 9 %
Lymphs Abs: 1.1 10*3/uL (ref 0.7–4.0)
MCH: 30 pg (ref 26.0–34.0)
MCHC: 32.5 g/dL (ref 30.0–36.0)
MCV: 92.4 fL (ref 80.0–100.0)
Monocytes Absolute: 0.8 10*3/uL (ref 0.1–1.0)
Monocytes Relative: 7 %
Neutro Abs: 10.1 10*3/uL — ABNORMAL HIGH (ref 1.7–7.7)
Neutrophils Relative %: 80 %
Platelets: 170 10*3/uL (ref 150–400)
RBC: 3.8 MIL/uL — ABNORMAL LOW (ref 4.22–5.81)
RDW: 15 % (ref 11.5–15.5)
WBC: 12.4 10*3/uL — ABNORMAL HIGH (ref 4.0–10.5)
nRBC: 0 % (ref 0.0–0.2)

## 2019-10-04 MED ORDER — HALOPERIDOL LACTATE 5 MG/ML IJ SOLN: 2.0000 mg | Freq: Four times a day (QID) | INTRAMUSCULAR | Status: DC | PRN

## 2019-10-04 MED ORDER — LORAZEPAM 2 MG/ML IJ SOLN
0.5000 mg | INTRAMUSCULAR | Status: DC | PRN
  Administered 2019-10-04: 0.5 mg via INTRAVENOUS
  Filled 2019-10-04: qty 1

## 2019-10-04 MED ORDER — LORAZEPAM 2 MG/ML IJ SOLN
1.0000 mg | INTRAMUSCULAR | Status: DC | PRN
  Administered 2019-10-04: 1 mg via INTRAVENOUS
  Filled 2019-10-04: qty 1

## 2019-10-04 NOTE — Evaluation (Signed)
Physical Therapy Evaluation Patient Details Name: Austin Sawyer MRN: [id number] DOB: August 08, 1935 Today's Date: 10/04/2019   History of Present Illness  Austin Sawyer is a 84 y.o. male with medical history significant of CHF, HLD, CAD,  diastolic dysfunction, diabetes mellitus type 2, CVA, ICH, dementia, and seizures presented to the hospital after having right arm jerking  Clinical Impression  Patient presents with decreased mobility due to generalized weakness and balance deficits following seizure.  Currently min A overall for mobility.  Feel he will benefit from skilled PT in the acute setting to maximize mobility prior to d/c home with family support and follow up HHPT.     Follow Up Recommendations Home health PT;Supervision/Assistance - 24 hour    Equipment Recommendations  None recommended by PT    Recommendations for Other Services       Precautions / Restrictions Precautions Precautions: Fall      Mobility  Bed Mobility Overal bed mobility: Needs Assistance Bed Mobility: Supine to Sit     Supine to sit: Mod assist;HOB elevated     General bed mobility comments: lifting assistance for trunk  Transfers Overall transfer level: Needs assistance Equipment used: Rolling walker (2 wheeled) Transfers: Sit to/from Stand Sit to Stand: Min assist;+2 safety/equipment         General transfer comment: some lifting help to stand, pt unfamiliar with walker, instructional cues to use  Ambulation/Gait Ambulation/Gait assistance: Min assist Gait Distance (Feet): 12 Feet(x 2) Assistive device: Rolling walker (2 wheeled) Gait Pattern/deviations: Step-to pattern;Step-through pattern     General Gait Details: slow and assist to use RW to turn to go into bathroom in room and to turn to get to sink on the way out  Stairs            Wheelchair Mobility    Modified Rankin (Stroke Patients Only)       Balance Overall balance assessment: Needs assistance   Sitting  balance-Leahy Scale: Good     Standing balance support: During functional activity Standing balance-Leahy Scale: Fair Standing balance comment: static standing while washing hands with S, for mobility needs at  min A for safety                             Pertinent Vitals/Pain Pain Assessment: No/denies pain    Home Living Family/patient expects to be discharged to:: Private residence Living Arrangements: Spouse/significant other;Other (Comment)(per chart has nursing care 7 days a week) Available Help at Discharge: Family;Available 24 hours/day Type of Home: House Home Access: Level entry     Home Layout: One level Home Equipment: Cane - single point Additional Comments: patient unable to answer questions about home, information from prior stay and per chart utilizes a cane at home.    Prior Function                 Hand Dominance        Extremity/Trunk Assessment   Upper Extremity Assessment Upper Extremity Assessment: Defer to OT evaluation    Lower Extremity Assessment Lower Extremity Assessment: Generalized weakness    Cervical / Trunk Assessment Cervical / Trunk Assessment: Kyphotic  Communication   Communication: HOH  Cognition Arousal/Alertness: Awake/alert Behavior During Therapy: WFL for tasks assessed/performed Overall Cognitive Status: History of cognitive impairments - at baseline  General Comments: per RN patient confused and combative this morning, had Ativan late this am, initially slow to rouse, then was appropriately confused and asking multiple questions about the same issue.      General Comments General comments (skin integrity, edema, etc.): patient initially wearing mitts, did not replace end of session due to pt eating lunch, nursing aware    Exercises     Assessment/Plan    PT Assessment Patient needs continued PT services  PT Problem List Decreased mobility;Decreased  safety awareness;Decreased balance;Decreased knowledge of use of DME       PT Treatment Interventions DME instruction;Therapeutic activities;Balance training;Patient/family education;Functional mobility training;Gait training    PT Goals (Current goals can be found in the Care Plan section)  Acute Rehab PT Goals Patient Stated Goal: none stated PT Goal Formulation: Patient unable to participate in goal setting Time For Goal Achievement: 10/18/19 Potential to Achieve Goals: Fair    Frequency Min 3X/week   Barriers to discharge        Co-evaluation PT/OT/SLP Co-Evaluation/Treatment: Yes Reason for Co-Treatment: For patient/therapist safety PT goals addressed during session: Mobility/safety with mobility;Balance;Proper use of DME         AM-PAC PT "6 Clicks" Mobility  Outcome Measure Help needed turning from your back to your side while in a flat bed without using bedrails?: A Little Help needed moving from lying on your back to sitting on the side of a flat bed without using bedrails?: A Lot Help needed moving to and from a bed to a chair (including a wheelchair)?: A Little Help needed standing up from a chair using your arms (e.g., wheelchair or bedside chair)?: A Little Help needed to walk in hospital room?: A Little Help needed climbing 3-5 steps with a railing? : A Little 6 Click Score: 17    End of Session   Activity Tolerance: Patient tolerated treatment well Patient left: in chair;with call bell/phone within reach;with chair alarm set Nurse Communication: Mobility status PT Visit Diagnosis: Other abnormalities of gait and mobility (R26.89)    Time: 1308-6578 PT Time Calculation (min) (ACUTE ONLY): 29 min   Charges:   PT Evaluation $PT Eval Moderate Complexity: [place], [place] [phone] 10/04/2019   Reginia Naas 10/04/2019, 3:59 PM

## 2019-10-04 NOTE — Evaluation (Signed)
Occupational Therapy Evaluation Patient Details Name: Austin Sawyer MRN: [id number] DOB: 11/05/1934 Today's Date: 10/04/2019    History of Present Illness Austin Sawyer is a 84 y.o. male with medical history significant of CHF, HLD, CAD,  diastolic dysfunction, diabetes mellitus type 2, CVA, ICH, dementia, and seizures presented to the hospital after having right arm jerking   Clinical Impression   Limited history obtained from pt considering baseline cognitive deficits, so majority of information taken from chart. PTA pt living at home with spouse with support from son and nursing care x7 per week. At time of eval, pt is mod A for bed mobility and min A +2 for transfers and functional mobility for safety and safe use of RW  (which appears unfamiliar to pt). Pt completed toilet transfer at min A +2 for safety with RW. Step by step direct cues needed to sequence and complete tasks. [facility] and [facility]. Will continue to follow per POC listed below.     Follow Up Recommendations  Home health OT;Supervision/Assistance - 24 hour    Equipment Recommendations  None recommended by OT    Recommendations for Other Services       Precautions / Restrictions Precautions Precautions: Fall Restrictions Weight Bearing Restrictions: No      Mobility Bed Mobility Overal bed mobility: Needs Assistance Bed Mobility: Supine to Sit     Supine to sit: Mod assist;HOB elevated     General bed mobility comments: assist for BLE to EOB And truncal support to come to sitting EOB  Transfers Overall transfer level: Needs assistance Equipment used: Rolling walker (2 wheeled) Transfers: Sit to/from Stand Sit to Stand: Min assist;+2 safety/equipment         General transfer comment: min A to rise and steady, clearly unfamiliar with RW and needing navigational cues for safe usage    Balance Overall balance assessment: Needs assistance   Sitting  balance-Leahy Scale: Good     Standing balance support: During functional activity Standing balance-Leahy Scale: Fair Standing balance comment: needing external support for dynamic tasks like toilet hygiene, but able to maintain static standing with supervision                           ADL either performed or assessed with clinical judgement   ADL Overall ADL's : Needs assistance/impaired Eating/Feeding: Set up;Cueing for sequencing;Sitting;Cueing for safety Eating/Feeding Details (indicate cue type and reason): pt needing packaging opened, food cut and initiation cues Grooming: Minimal assistance;Standing;Cueing for safety;Cueing for sequencing Grooming Details (indicate cue type and reason): min A level cues, direct and step by step to follow commands and sequence appropriately Upper Body Bathing: Minimal assistance;Sitting;Cueing for safety;Cueing for sequencing   Lower Body Bathing: Sit to/from stand;Sitting/lateral leans;Cueing for sequencing;Cueing for safety;Maximal assistance   Upper Body Dressing : Minimal assistance;Sitting;Cueing for sequencing;Cueing for safety   Lower Body Dressing: Maximal assistance;Sitting/lateral leans;Sit to/from stand;Cueing for safety;Cueing for sequencing   Toilet Transfer: Minimal assistance;Regular Toilet;Ambulation;RW;Cueing for safety;Cueing for sequencing   Toileting- Clothing Manipulation and Hygiene: Maximal assistance;Sit to/from stand Toileting - Clothing Manipulation Details (indicate cue type and reason): needing external support from RW and grab bars to steady while OT completed hygiene Tub/ Shower Transfer: Minimal assistance;Ambulation;Shower seat;Rolling walker;Cueing for safety;Cueing for sequencing   Functional mobility during ADLs: Minimal assistance;+2 for physical assistance;+2 for safety/equipment;Cueing for safety;Cueing for sequencing;Rolling walker General ADL Comments: pt limited by baseline cognitive deficits,  suspect functionally  close to baseline     Vision Patient Visual Report: No change from baseline Additional Comments: per chart review pt with R hemianopia at baseline, cues and set up for compensation needed with BADL     Perception     Praxis      Pertinent Vitals/Pain Pain Assessment: No/denies pain     Hand Dominance     Extremity/Trunk Assessment Upper Extremity Assessment Upper Extremity Assessment: Generalized weakness   Lower Extremity Assessment Lower Extremity Assessment: Defer to PT evaluation   Cervical / Trunk Assessment Cervical / Trunk Assessment: Kyphotic   Communication Communication Communication: HOH   Cognition Arousal/Alertness: Awake/alert Behavior During Therapy: WFL for tasks assessed/performed Overall Cognitive Status: History of cognitive impairments - at baseline                                 General Comments: per RN pt combative this AM. Pt pleasnat with structure and direction this session. Asking multiple questions and difficulty with sentence formation without cueing   General Comments  patient initially wearing mitts, did not replace end of session due to pt eating lunch, nursing aware    Exercises     Shoulder Instructions      Home Living Family/patient expects to be discharged to:: Private residence Living Arrangements: Spouse/significant other;Other (Comment)(per chart nursing care 7 times a week) Available Help at Discharge: Family;Available 24 hours/day Type of Home: House Home Access: Level entry     Home Layout: One level               Home Equipment: Cane - single point   Additional Comments: patient unable to answer questions about home, information from prior stay and per chart utilizes a cane at home.      Prior Functioning/Environment                   OT Problem List: Decreased strength;Decreased knowledge of use of DME or AE;Decreased activity tolerance;Decreased  cognition;Impaired balance (sitting and/or standing);Decreased safety awareness;Impaired vision/perception      OT Treatment/Interventions: Self-care/ADL training;Therapeutic exercise;Patient/family education;Balance training;Energy conservation;Therapeutic activities;DME and/or AE instruction;Cognitive remediation/compensation    OT Goals(Current goals can be found in the care plan section) Acute Rehab OT Goals Patient Stated Goal: none stated OT Goal Formulation: With patient Time For Goal Achievement: 10/18/19 Potential to Achieve Goals: Good  OT Frequency: Min 2X/week   Barriers to D/C:            Co-evaluation PT/OT/SLP Co-Evaluation/Treatment: Yes Reason for Co-Treatment: Necessary to address cognition/behavior during functional activity;For patient/therapist safety PT goals addressed during session: Mobility/safety with mobility;Balance;Proper use of DME OT goals addressed during session: ADL's and self-care;Proper use of Adaptive equipment and DME;Strengthening/ROM      AM-PAC OT "6 Clicks" Daily Activity     Outcome Measure Help from another person eating meals?: A Little Help from another person taking care of personal grooming?: A Little Help from another person toileting, which includes using toliet, bedpan, or urinal?: A Little Help from another person bathing (including washing, rinsing, drying)?: A Lot Help from another person to put on and taking off regular upper body clothing?: A Little Help from another person to put on and taking off regular lower body clothing?: A Lot 6 Click Score: 16   End of Session Equipment Utilized During Treatment: Gait belt;Rolling walker Nurse Communication: Mobility status  Activity Tolerance: Patient tolerated treatment well Patient left: in chair;with  call bell/phone within reach;with chair alarm set;Other (comment)(purewick fell off; restraints not on due to pt eating)  OT Visit Diagnosis: Unsteadiness on feet (R26.81);Other  abnormalities of gait and mobility (R26.89);Muscle weakness (generalized) (M62.81);Other symptoms and signs involving cognitive function                Time: 1516-1540 OT Time Calculation (min): 24 min Charges:  OT General Charges $OT Visit: 1 Visit OT Evaluation $OT Eval Moderate Complexity: 1 Mod  Zenovia Jarred, MSOT, OTR/L [place] OT/ Acute Relief OT [facility] Office: [phone]  Zenovia Jarred 10/04/2019, 4:43 PM

## 2019-10-04 NOTE — Progress Notes (Signed)
PROGRESS NOTE  Austin Sawyer DJS:[id number] DOB: Feb 11, 1935 DOA: 10/02/2019 PCP: System, Pcp Not In   LOS: 1 day   Brief narrative: As per HPI,   Austin Sawyer is a 84 y.o. male with medical history significant of CHF, HLD, CAD,  diastolic dysfunction, diabetes mellitus type 2, CVA, ICH, dementia, and seizures presented to the hospital after having right arm jerking .  At baseline patient dementia, lives at home with his wife, ambulates with the use of a cane, and has nursing care that comes in 7 days a week.  History was obtained from the patient's wife due to his history of dementia, he is very hard of hearing.  His wife reported that when the nurse came to see him his right arm was jerking kind of like he was having muscle spasms.  Due to the symptoms, he was unable to eat and they reported that he just did not look right or sound like himself.  Symptoms persisted for 3 hours then thereafter they called EMS where patient was [facility] to [facility].  His wife notes that his last seizure was in June of this year.  Otherwise he had been in his normal state of health and had been eating and drinking like normal.   ED Course: Upon admission into the emergency department patient was seen to be afebrile, pulse elevated up to 128, blood pressures 95/52-183/85, and all other vital signs relatively within normal limits.  CT scan of the brain did not show any acute infarct or signs of hemorrhage.  Labs significant for hemoglobin 10.1(baseline previously 12-13), BUN 46, and creatinine 2.95 (baseline 1). Urinalysis was negative.  COVID-19 screening was negative.  Patient was given medications of Coreg, hydralazine, Lamictal 150 mg, Seroquel 25 mg, Haldol 2 mg IM, and 1L of normal saline IV fluids. Patient  was then admitted to the hospital.  Assessment/Plan:  Principal Problem:   ARF (acute renal failure) (HCC) Active Problems:   Hyperlipidemia   Gout   Essential hypertension   Type 2 diabetes mellitus with other  circulatory complications (HCC)   Seizures (HCC)   Diastolic dysfunction  Acute kidney injury.  Creatinine has improved from 2.9 to 1.6.  Lab work from today still pending because patient had refused.  Will try to do today. Continue on IV fluid hydration for now.  Strict intake and output charting. -Continue normal saline at 75 mL/h as tolerated. Hold nephrotoxic agents.  Total CK elevated at 858.  Urinalysis was negative for protein or hemoglobin in the urine.  Right arm twitching suspected secondary to seizure disorder: resolved at this time.  Patient follows with Dr. Roda Shutters of neurology in outpatient setting.  Seen by neurology while in the hospital.  Thought to be secondary to AKI.  We will continue gabapentin and Lamictal at this time.  No EEG recommended at this time.  Normocytic normochromic anemia: Hemoglobin of 11.0 today from will 10.0.  Will closely monitor.  Essential hypertension: On admission blood pressures elevated up to 183/85.  Resume Coreg hydralazine.  Hold lisinopril.    History of CVA and ICH: Patient with residual weakness and memory deficits per the patient's son. Continue statin.  Patient does have a appointment with [facility] stroke clinic on 10/24/2019.  Diabetes mellitus type 2: Hemoglobin A1c of 5.2.  Patient appears to be well controlled on Metformin as outpatient. Hold outpatient metformin.  Sliding scale of insulin if needed.  Diabetic diet.  History of congestive heart failure with diastolic dysfunction:  Compensated at this time. Last EF noted to be 60 to 65% with grade 1 diastolic dysfunction and 2017.   Hyperlipidemia. Continue atorvastatin  BPH. Ultrasound of the kidneys shows no acute abnormality with bilateral renal simple cyst.  Continue tamsulosin.  History of gout. On allopurinol at home.  Hold for now.  VTE Prophylaxis: Lovenox subcutaneous  Code Status: Full Code  Family Communication: Spoke with the patient's spouse on  the phone and updated her about the clinical condition of the patient.   Disposition Plan: Home likely by tomorrow.  Will get PT evaluation.   Consultants:  Neurology  Procedures:  None  Antibiotics: Anti-infectives (From admission, onward)   None     Subjective: Today, patient had refused blood work.  Patient is hard of hearing.  Denies any nausea vomiting or abdominal pain.  Finally, patient is willing to get the blood work done.  Objective: Vitals:   10/03/19 2203 10/04/19 0756  BP: (!) 157/81 (!) 151/71  Pulse: 75 68  Resp: (!) 21 17  Temp: 98.4 F (36.9 C) 98.2 F (36.8 C)  SpO2: 99% 100%    Intake/Output Summary (Last 24 hours) at 10/04/2019 1010 Last data filed at 10/04/2019 [id number] Gross per 24 hour  Intake 689.25 ml  Output 650 ml  Net 39.25 ml   Filed Weights   10/02/19 1129 10/03/19 0646 10/04/19 0552  Weight: 90.7 kg 79.8 kg 82.7 kg   Body mass index is 26.91 kg/m.   Physical Exam: GENERAL: Patient is alert awake and communicative.  Hard of hearing. Not in obvious distress. HENT: No scleral pallor or icterus. Pupils equally reactive to light. Oral mucosa is moist NECK: is supple, no palpable thyroid enlargement. CHEST: Clear to auscultation. No crackles or wheezes. Non tender on palpation. Diminished breath sounds bilaterally. CVS: S1 and S2 heard, systolic murmur noted. Regular rate and rhythm. No pericardial rub. ABDOMEN: Soft, non-tender, bowel sounds are present.  Condom catheter in place. No palpable hepato-splenomegaly. EXTREMITIES: No edema. CNS: Cranial nerves are intact.  Moving all extremities.  Oriented to place and person. SKIN: warm and dry without rashes.  Data Review: I have personally reviewed the following laboratory data and studies,  CBC: Recent Labs  Lab 10/02/19 1234 10/03/19 0836  WBC 10.0 10.2  NEUTROABS 7.7  --   HGB 10.1* 11.0*  HCT 32.0* 33.9*  MCV 94.1 93.4  PLT 173 153   Basic Metabolic Panel: Recent Labs   Lab 10/02/19 1234 10/03/19 0836  NA 136 140  K 4.5 4.6  CL 101 108  CO2 27 21*  GLUCOSE 92 103*  BUN 46* 31*  CREATININE 2.95* 1.68*  CALCIUM 9.1 9.1  MG 2.1  --    Liver Function Tests: Recent Labs  Lab 10/02/19 1234  AST 16  ALT 13  ALKPHOS 60  BILITOT 0.6  PROT 7.3  ALBUMIN 4.0   No results for input(s): LIPASE, AMYLASE in the last 168 hours. No results for input(s): AMMONIA in the last 168 hours. Cardiac Enzymes: Recent Labs  Lab 10/03/19 0836  CKTOTAL 858*   BNP (last 3 results) No results for input(s): BNP in the last 8760 hours.  ProBNP (last 3 results) No results for input(s): PROBNP in the last 8760 hours.  CBG: Recent Labs  Lab 10/02/19 1151 10/03/19 0732  GLUCAP 98 89   Recent Results (from the past 240 hour(s))  Urine culture     Status: Abnormal   Collection Time: 10/02/19 12:51 PM   Specimen:  Urine, Random  Result Value Ref Range Status   Specimen Description   Final    URINE, RANDOM Performed at [facility], [address] [place], [place] [address]    Special Requests   Final    NONE Performed at [facility], [address] [place], [place] [address]    Culture (A)  Final    <10,000 COLONIES/mL INSIGNIFICANT GROWTH Performed at [facility], [address] [place], [place] [address]    Report Status 10/03/2019 FINAL  Final  SARS CORONAVIRUS 2 (TAT 6-24 HRS) Nasopharyngeal Nasopharyngeal Swab     Status: None   Collection Time: 10/02/19  2:45 PM   Specimen: Nasopharyngeal Swab  Result Value Ref Range Status   SARS Coronavirus 2 NEGATIVE NEGATIVE Final    Comment: (NOTE) SARS-CoV-2 target nucleic acids are NOT DETECTED. The SARS-CoV-2 RNA is generally detectable in upper and lower respiratory specimens during the acute phase of infection. Negative results do not preclude SARS-CoV-2 infection, do not rule out co-infections with other pathogens, and should not be used as the sole basis for  treatment or other patient management decisions. Negative results must be combined with clinical observations, patient history, and epidemiological information. The expected result is Negative. Fact Sheet for Patients: HairSlick.no Fact Sheet for Healthcare Providers: quierodirigir.com This test is not yet approved or cleared by the [place] FDA and  has been authorized for detection and/or diagnosis of SARS-CoV-2 by FDA under an Emergency Use Authorization (EUA). This EUA will remain  in effect (meaning this test can be used) for the duration of the COVID-19 declaration under Section 56 4(b)(1) of the Act, 21 U.S.C. section 360bbb-3(b)(1), unless the authorization is terminated or revoked sooner. Performed at [facility], [address] [place], [place] [address]      Studies: DG Chest 2 View  Result Date: 10/02/2019 CLINICAL DATA:  Altered mental status with apparent seizure EXAM: CHEST - 2 VIEW COMPARISON:  March 12, 2019 FINDINGS: Lungs are clear. Heart size and pulmonary vascularity are normal. No adenopathy. There is aortic atherosclerosis. There is degenerative change in each shoulder. IMPRESSION: No edema or consolidation. Stable cardiac silhouette. Aortic Atherosclerosis (ICD10-I70.0). Electronically Signed   By: Bretta Bang III M.D.   On: 10/02/2019 14:17   CT Head Wo Contrast  Result Date: 10/02/2019 CLINICAL DATA:  Altered mental status with apparent seizure. Confusion. EXAM: CT HEAD WITHOUT CONTRAST TECHNIQUE: Contiguous axial images were obtained from the base of the skull through the vertex without intravenous contrast. COMPARISON:  March 30, 2018 FINDINGS: Brain: Moderate diffuse atrophy is stable. There is no intracranial mass, acute hemorrhage, extra-axial fluid collection, or midline shift. There is evidence of a prior infarct involving portions of the medial and mid left occipital lobe. There is a  prior infarct in the right frontal lobe. Calcification along the medial aspect of this prior infarct is stable and may represent residua of previous hemorrhage in this area. There is evidence of a prior smaller infarct at the gray-white junction of the mid right frontal lobe at the level of the superior aspect of the right lateral ventricle anteriorly. Elsewhere there is patchy small vessel disease in the centra semiovale bilaterally. No acute appearing infarct is evident on this study. Vascular: There is no appreciable hyperdense vessel. There is calcification in each carotid siphon region. Skull: The bony calvarium appears intact. Sinuses/Orbits: There is mucosal thickening in several ethmoid air cells. Other visualized paranasal  sinuses are clear. Visualized orbits appear symmetric bilaterally. Other: Mastoid air cells are clear. IMPRESSION: Stable atrophy with multiple prior infarcts and periventricular small vessel disease. Calcification in the right frontal region is stable and likely represents residua from prior hemorrhage. No acute hemorrhage or mass evident. No acute infarct demonstrable. There are foci of arterial vascular calcification. There is mucosal thickening in several ethmoid air cells. Electronically Signed   By: Lowella Grip III M.D.   On: 10/02/2019 14:16   US RENAL  Result Date: 10/03/2019 CLINICAL DATA:  Acute renal failure. EXAM: RENAL / URINARY TRACT ULTRASOUND COMPLETE COMPARISON:  Abdominal ultrasound dated May 22, 2012. FINDINGS: Right Kidney: Renal measurements: 10.3 x 4.6 x 5.5 cm = volume: 135 mL . Echogenicity within normal limits. No mass or hydronephrosis visualized. Two simple cysts measuring up to 1.7 cm. Left Kidney: Renal measurements: 10.7 x 5.7 x 5.1 cm = volume: 162 mL. Echogenicity within normal limits. No mass or hydronephrosis visualized. Two simple cysts measuring up to 2.7 cm. Bladder: Appears normal for degree of bladder distention. Other: None.  IMPRESSION: 1. No acute abnormality. 2. Bilateral renal simple cysts. Electronically Signed   By: Titus Dubin M.D.   On: 10/03/2019 10:09    Scheduled Meds: . atorvastatin  20 mg Oral q1800  . carvedilol  12.5 mg Oral BID WC  . enoxaparin (LOVENOX) injection  40 mg Subcutaneous Q24H  . gabapentin  300 mg Oral TID  . hydrALAZINE  50 mg Oral QID  . lamoTRIgine  150 mg Oral BID  . QUEtiapine  25 mg Oral QHS  . sodium chloride flush  3 mL Intravenous Q12H  . tamsulosin  0.4 mg Oral Daily    Continuous Infusions: . sodium chloride 75 mL/hr at 10/04/19 0012     Flora Lipps, MD  [facility] 10/04/2019

## 2019-10-05 LAB — COMPREHENSIVE METABOLIC PANEL
ALT: 12 U/L (ref 0–44)
AST: 20 U/L (ref 15–41)
Albumin: 3.5 g/dL (ref 3.5–5.0)
Alkaline Phosphatase: 58 U/L (ref 38–126)
Anion gap: 9 (ref 5–15)
BUN: 19 mg/dL (ref 8–23)
CO2: 22 mmol/L (ref 22–32)
Calcium: 8.7 mg/dL — ABNORMAL LOW (ref 8.9–10.3)
Chloride: 112 mmol/L — ABNORMAL HIGH (ref 98–111)
Creatinine, Ser: 1.51 mg/dL — ABNORMAL HIGH (ref 0.61–1.24)
GFR calc Af Amer: 49 mL/min — ABNORMAL LOW (ref >60)
GFR calc non Af Amer: 42 mL/min — ABNORMAL LOW (ref >60)
Glucose, Bld: 88 mg/dL (ref 70–99)
Potassium: 4.1 mmol/L (ref 3.5–5.1)
Sodium: 143 mmol/L (ref 135–145)
Total Bilirubin: 0.7 mg/dL (ref 0.3–1.2)
Total Protein: 6.4 g/dL — ABNORMAL LOW (ref 6.5–8.1)

## 2019-10-05 LAB — CBC
HCT: 29.3 % — ABNORMAL LOW (ref 39.0–52.0)
Hemoglobin: 9.3 g/dL — ABNORMAL LOW (ref 13.0–17.0)
MCH: 29.4 pg (ref 26.0–34.0)
MCHC: 31.7 g/dL (ref 30.0–36.0)
MCV: 92.7 fL (ref 80.0–100.0)
Platelets: 201 10*3/uL (ref 150–400)
RBC: 3.16 MIL/uL — ABNORMAL LOW (ref 4.22–5.81)
RDW: 14.8 % (ref 11.5–15.5)
WBC: 10.4 10*3/uL (ref 4.0–10.5)
nRBC: 0 % (ref 0.0–0.2)

## 2019-10-05 LAB — MAGNESIUM: Magnesium: 1.7 mg/dL (ref 1.7–2.4)

## 2019-10-05 MED ORDER — AMLODIPINE BESYLATE 10 MG PO TABS: 10.0000 mg | ORAL_TABLET | Freq: Every day | ORAL | 11 refills | Status: DC

## 2019-10-05 NOTE — TOC Transition Note (Signed)
Transition of Care [facility]) - CM/SW Discharge Note   Patient Details  Name: Austin Sawyer MRN: [id number] Date of Birth: 02-06-1935  Transition of Care [facility] CM/SW Contact:  Baldemar Lenis, LCSW Phone Number: 10/05/2019, 12:19 PM   Clinical Narrative:   CSW spoke with patient's wife, Wilnette Kales, about patient's discharge and Thelma told CSW to call Peyton Najjar about needs. CSW spoke with Peyton Najjar, explained recommendation for home health and rolling walker. Patient has walker at home already and aide support, but son would be very interested in having therapy for the patient as the wife likes to keep him sedentary and he needs to move. CSW discussed CMS list and son had no preference; referral given to [facility] for home health. Peyton Najjar to provide transportation. Patient's primary care doctor is Dr. Carleene Cooper. No other needs identified at this time.    Final next level of care: Home w Home Health Services Barriers to Discharge: Barriers Resolved   Patient Goals and CMS Choice Patient states their goals for this hospitalization and ongoing recovery are:: patient unable to state goals due to confusion; family ready for patient to come home CMS Medicare.gov Compare Post Acute Care list provided to:: Patient Represenative (must comment) Choice offered to / list presented to : Adult Children  Discharge Placement                Patient to be transferred to facility by: Family car Name of family member notified: Geni Bers Patient and family notified of of transfer: 10/05/19  Discharge Plan and Services                          HH Arranged: PT, OT, Nurse's Aide HH Agency: [facility] Date [facility] Agency Contacted: 10/05/19 Time HH Agency Contacted: 1219 Representative spoke with at [facility] Agency: Kandee Keen  Social Determinants of Health (SDOH) Interventions     Readmission Risk Interventions No flowsheet data found.

## 2019-10-05 NOTE — Discharge Summary (Signed)
Physician Discharge Summary  Austin Sawyer HER:[id number] DOB: Aug 20, 1935 DOA: 10/02/2019  PCP: System, Pcp Not In  Admit date: 10/02/2019 Discharge date: 10/05/2019  Admitted From: Home  Discharge disposition: Home with Home PT   Recommendations for Outpatient Follow-Up:   Follow up with your primary care provider in one week.  You will need to check blood work at that time.  Your medications have changed, please discuss with your primary care physician about adequate blood pressure control.  Discharge Diagnosis:   Principal Problem:   ARF (acute renal failure) (HCC) Active Problems:   Hyperlipidemia   Gout   Essential hypertension   Type 2 diabetes mellitus with other circulatory complications (HCC)   Seizures (HCC)   Diastolic dysfunction   Discharge Condition: Improved.  Diet recommendation:   Carbohydrate-modified.  Wound care: None.  Code status: Full.   History of Present Illness:   Austin Sawyer a 84 y.o.malewith medical history significant ofCHF, HLD, CAD,diastolic dysfunction,diabetes mellitus type 2, CVA, ICH,dementia,and seizures presented to the hospital after having right arm jerking . At baseline patient has dementia, lives at home with his wife, ambulates with the use of a cane, and has nursing care that comes in7 days a week. History was obtained from the patient's wife due to his history of dementia. Patient  is very hard of hearing. His wife reported that when the nurse came to see him his right arm was jerking kind of like he was having muscle spasms. Due to the symptoms, he was unable to eat and they reported that he just did not look right or sound like himself. Symptoms persisted for 3 hours then thereafter they called EMS where patient was taken to [facility]. His wife noted that his last seizure was in [place] this year. Otherwise he had been in his normal state of health and had been eating and drinking like normal.  ED Course:Upon  admission into the emergency department patient was seen to be afebrile, pulse elevated up to 128, blood pressures 95/52-183/85, and all other vital signs relatively within normal limits. CT scan of the brain did not show any acute infarct or signs of hemorrhage. Labs significant for hemoglobin 10.1(baseline previously 12-13), BUN46, and creatinine 2.95 (baseline1).Urinalysis was negative. COVID-19 screening was negative. Patient was given medications of Coreg, hydralazine, Lamictal 150 mg, Seroquel 25 mg, Haldol 2 mg IM, and 1Lof normal saline IV fluids.Patient  was then admitted to the hospital.   Hospital Course:  Following conditions were addressed during hospitalization as listed below, follow-up plans in italics.  Acute kidney injury.  Creatinine has improved from 2.9 to 1.5.  Received IV fluids.  Total CK elevated at 858.  Urinalysis was negative for protein or hemoglobin in the urine.  Lisinopril and HCTZ will be discontinued on discharge.  Right arm twitchingsuspected secondary toseizure disorder:resolved at this time. Patient follows with [facility] neurology in outpatient setting.  Seen by neurology while in the hospital.  Thought to be secondary to AKI.  Patient was continued gabapentin and Lamictal. No EEG recommended at this time.  Patient does have an appointment with neurology for [facility] stroke clinic on 10/24/2019.   Normocytic normochromic anemia: Hemoglobin of 9.3. Will need to closely monitor as outpatient.  Essential hypertension: On admission blood pressures elevated up to 183/85.  Resume Coreg hydralazine on discharge.     Lisinopril and HCTZ will be discontinued due to poor oral intake dehydration and acute kidney injury..  Have started on amlodipine.  PCP will  need to closely monitor the blood pressure medications as necessary.  History of CVA and ICH: Patient with residual weakness and memory deficits per the patient's son. Continue  statin.  Patient does have a appointment with [facility] stroke clinic on 10/24/2019.  Diabetes mellitus type 2: Hemoglobin A1c of 5.2.  Patient appears to be well controlled on Metformin as outpatient. Diabetic diet and metformin to be continued on discharge.Marland Kitchen  History of congestive heart failure with diastolic dysfunction:  Compensated at this time. Last EF noted to be 60 to 65% with grade 1 diastolic dysfunction and 1740.  Follow-up with primary care physician  Hyperlipidemia. Continue atorvastatin  BPH. Ultrasound of the kidneys shows no acute abnormality with bilateral renal simple cyst.  Continue tamsulosin.  History of gout. On allopurinol at home.  Hold for now.  Disposition.  At this time, patient is stable for disposition home with home health services.  Patient was seen by physical therapy and occupational therapy recommend home PT OT on discharge.  I spoke with the patient's son on the phone and updated him about the plan for disposition and follow-up.  Medical Consultants:    Neurology  Procedures:    None Subjective:   Today, patient feels better.  Denies any dizziness, lightheadedness shortness of breath chest pain or palpitation.  No seizures reported.  Discharge Exam:   Vitals:   10/04/19 1605 10/05/19 0846  BP: 136/75 (!) 141/96  Pulse: 71 73  Resp: 17 16  Temp: 98.5 F (36.9 C) 98.2 F (36.8 C)  SpO2: 98% 100%   Vitals:   10/04/19 0552 10/04/19 0756 10/04/19 1605 10/05/19 0846  BP:  (!) 151/71 136/75 (!) 141/96  Pulse:  68 71 73  Resp:  17 17 16   Temp:  98.2 F (36.8 C) 98.5 F (36.9 C) 98.2 F (36.8 C)  TempSrc:  Oral Axillary Oral  SpO2:  100% 98% 100%  Weight: 82.7 kg     Height:       General: Alert awake, not in obvious distress, hard of hearing. HENT: pupils equally reacting to light and accommodation.  No scleral pallor or icterus noted. Oral mucosa is moist.  Chest:  Clear breath sounds.  Diminished breath  sounds bilaterally. No crackles or wheezes.  CVS: S1 &S2 heard.  Systolic murmur noted..  Regular rate and rhythm. Abdomen: Soft, nontender, nondistended.  Bowel sounds are heard.   Extremities: No cyanosis, clubbing or edema.  Peripheral pulses are palpable. Psych: Alert, awake and communicative, hard of hearing. CNS:  No cranial nerve deficits. Oriented to place and person. Power equal in all extremities.   Skin: Warm and dry.  No rashes noted.  The results of significant diagnostics from this hospitalization (including imaging, microbiology, ancillary and laboratory) are listed below for reference.     Diagnostic Studies:   DG Chest 2 View  Result Date: 10/02/2019 CLINICAL DATA:  Altered mental status with apparent seizure EXAM: CHEST - 2 VIEW COMPARISON:  March 12, 2019 FINDINGS: Lungs are clear. Heart size and pulmonary vascularity are normal. No adenopathy. There is aortic atherosclerosis. There is degenerative change in each shoulder. IMPRESSION: No edema or consolidation. Stable cardiac silhouette. Aortic Atherosclerosis (ICD10-I70.0). Electronically Signed   By: Lowella Grip III M.D.   On: 10/02/2019 14:17   CT Head Wo Contrast  Result Date: 10/02/2019 CLINICAL DATA:  Altered mental status with apparent seizure. Confusion. EXAM: CT HEAD WITHOUT CONTRAST TECHNIQUE: Contiguous axial images were obtained from the base of the  skull through the vertex without intravenous contrast. COMPARISON:  March 30, 2018 FINDINGS: Brain: Moderate diffuse atrophy is stable. There is no intracranial mass, acute hemorrhage, extra-axial fluid collection, or midline shift. There is evidence of a prior infarct involving portions of the medial and mid left occipital lobe. There is a prior infarct in the right frontal lobe. Calcification along the medial aspect of this prior infarct is stable and may represent residua of previous hemorrhage in this area. There is evidence of a prior smaller infarct at the  gray-white junction of the mid right frontal lobe at the level of the superior aspect of the right lateral ventricle anteriorly. Elsewhere there is patchy small vessel disease in the centra semiovale bilaterally. No acute appearing infarct is evident on this study. Vascular: There is no appreciable hyperdense vessel. There is calcification in each carotid siphon region. Skull: The bony calvarium appears intact. Sinuses/Orbits: There is mucosal thickening in several ethmoid air cells. Other visualized paranasal sinuses are clear. Visualized orbits appear symmetric bilaterally. Other: Mastoid air cells are clear. IMPRESSION: Stable atrophy with multiple prior infarcts and periventricular small vessel disease. Calcification in the right frontal region is stable and likely represents residua from prior hemorrhage. No acute hemorrhage or mass evident. No acute infarct demonstrable. There are foci of arterial vascular calcification. There is mucosal thickening in several ethmoid air cells. Electronically Signed   By: Bretta Bang III M.D.   On: 10/02/2019 14:16   US RENAL  Result Date: 10/03/2019 CLINICAL DATA:  Acute renal failure. EXAM: RENAL / URINARY TRACT ULTRASOUND COMPLETE COMPARISON:  Abdominal ultrasound dated May 22, 2012. FINDINGS: Right Kidney: Renal measurements: 10.3 x 4.6 x 5.5 cm = volume: 135 mL . Echogenicity within normal limits. No mass or hydronephrosis visualized. Two simple cysts measuring up to 1.7 cm. Left Kidney: Renal measurements: 10.7 x 5.7 x 5.1 cm = volume: 162 mL. Echogenicity within normal limits. No mass or hydronephrosis visualized. Two simple cysts measuring up to 2.7 cm. Bladder: Appears normal for degree of bladder distention. Other: None. IMPRESSION: 1. No acute abnormality. 2. Bilateral renal simple cysts. Electronically Signed   By: Obie Dredge M.D.   On: 10/03/2019 10:09     Labs:   Basic Metabolic Panel: Recent Labs  Lab 10/02/19 1234 10/03/19 0836  10/05/19 0559  NA 136 140 143  K 4.5 4.6 4.1  CL 101 108 112*  CO2 27 21* 22  GLUCOSE 92 103* 88  BUN 46* 31* 19  CREATININE 2.95* 1.68* 1.51*  CALCIUM 9.1 9.1 8.7*  MG 2.1  --  1.7   GFR Estimated Creatinine Clearance: 37.7 mL/min (A) (by C-G formula based on SCr of 1.51 mg/dL (H)). Liver Function Tests: Recent Labs  Lab 10/02/19 1234 10/05/19 0559  AST 16 20  ALT 13 12  ALKPHOS 60 58  BILITOT 0.6 0.7  PROT 7.3 6.4*  ALBUMIN 4.0 3.5   No results for input(s): LIPASE, AMYLASE in the last 168 hours. No results for input(s): AMMONIA in the last 168 hours. Coagulation profile No results for input(s): INR, PROTIME in the last 168 hours.  CBC: Recent Labs  Lab 10/02/19 1234 10/03/19 0836 10/04/19 2019 10/05/19 0559  WBC 10.0 10.2 12.4* 10.4  NEUTROABS 7.7  --  10.1*  --   HGB 10.1* 11.0* 11.4* 9.3*  HCT 32.0* 33.9* 35.1* 29.3*  MCV 94.1 93.4 92.4 92.7  PLT 173 153 170 201   Cardiac Enzymes: Recent Labs  Lab 10/03/19 0836  CKTOTAL  858*   BNP: Invalid input(s): POCBNP CBG: Recent Labs  Lab 10/02/19 1151 10/03/19 0732  GLUCAP 98 89   D-Dimer No results for input(s): DDIMER in the last 72 hours. Hgb A1c Recent Labs    10/03/19 0836  HGBA1C 5.2   Lipid Profile No results for input(s): CHOL, HDL, LDLCALC, TRIG, CHOLHDL, LDLDIRECT in the last 72 hours. Thyroid function studies No results for input(s): TSH, T4TOTAL, T3FREE, THYROIDAB in the last 72 hours.  Invalid input(s): FREET3 Anemia work up No results for input(s): VITAMINB12, FOLATE, FERRITIN, TIBC, IRON, RETICCTPCT in the last 72 hours. Microbiology Recent Results (from the past 240 hour(s))  Urine culture     Status: Abnormal   Collection Time: 10/02/19 12:51 PM   Specimen: Urine, Random  Result Value Ref Range Status   Specimen Description   Final    URINE, RANDOM Performed at [facility], [address] [place], [place] [address]    Special Requests   Final     NONE Performed at [facility], [address] [place], [place] [address]    Culture (A)  Final    <10,000 COLONIES/mL INSIGNIFICANT GROWTH Performed at [facility], [address], [place], [place] [address]    Report Status 10/03/2019 FINAL  Final  SARS CORONAVIRUS 2 (TAT 6-24 HRS) Nasopharyngeal Nasopharyngeal Swab     Status: None   Collection Time: 10/02/19  2:45 PM   Specimen: Nasopharyngeal Swab  Result Value Ref Range Status   SARS Coronavirus 2 NEGATIVE NEGATIVE Final    Comment: (NOTE) SARS-CoV-2 target nucleic acids are NOT DETECTED. The SARS-CoV-2 RNA is generally detectable in upper and lower respiratory specimens during the acute phase of infection. Negative results do not preclude SARS-CoV-2 infection, do not rule out co-infections with other pathogens, and should not be used as the sole basis for treatment or other patient management decisions. Negative results must be combined with clinical observations, patient history, and epidemiological information. The expected result is Negative. Fact Sheet for Patients: HairSlick.no Fact Sheet for Healthcare Providers: quierodirigir.com This test is not yet approved or cleared by the [place] FDA and  has been authorized for detection and/or diagnosis of SARS-CoV-2 by FDA under an Emergency Use Authorization (EUA). This EUA will remain  in effect (meaning this test can be used) for the duration of the COVID-19 declaration under Section 56 4(b)(1) of the Act, 21 U.S.C. section 360bbb-3(b)(1), unless the authorization is terminated or revoked sooner. Performed at [facility], [address] [place], [place] [address]      Discharge Instructions:   Discharge Instructions    Call MD for:   Complete by: As directed    Worsening symptoms.   Diet - low sodium heart healthy   Complete by: As directed    Diet Carb Modified   Complete by:  As directed    Discharge instructions   Complete by: As directed    Follow up with your primary care physician in 1 week.  Check blood work at that time.  Hydrochlorothiazide and lisinopril has been discontinued at this time due to kidney failure.  You might need other blood pressure medication.  You have been started on amlodipine for blood pressure.   Increase activity slowly   Complete by: As directed      Allergies as of 10/05/2019   No Known Allergies     Medication List    STOP taking these medications  hydrochlorothiazide 25 MG tablet Commonly known as: HYDRODIURIL   lisinopril 20 MG tablet Commonly known as: ZESTRIL     TAKE these medications   albuterol (2.5 MG/3ML) 0.083% nebulizer solution Commonly known as: PROVENTIL Take 3 mLs (2.5 mg total) by nebulization 2 (two) times daily as needed for wheezing or shortness of breath.   allopurinol 300 MG tablet Commonly known as: ZYLOPRIM Take 300 mg by mouth daily. For gout   amLODipine 10 MG tablet Commonly known as: NORVASC Take 1 tablet (10 mg total) by mouth daily.   atorvastatin 20 MG tablet Commonly known as: LIPITOR Take 1 tablet (20 mg total) by mouth daily at 6 PM.   camphor-menthol lotion Commonly known as: SARNA Apply 1 application topically 2 (two) times daily.   carvedilol 12.5 MG tablet Commonly known as: COREG Take 12.5 mg by mouth 2 (two) times daily with a meal. Patient takes 12.5 mg at lunch and 12.5 mg at dinner.   clotrimazole 1 % cream Commonly known as: LOTRIMIN Apply 1 application topically 2 (two) times daily.   fluocinonide cream 0.05 % Commonly known as: LIDEX Apply 1 application topically 2 (two) times daily.   gabapentin 300 MG capsule Commonly known as: NEURONTIN Take 300 mg by mouth 3 (three) times daily.   hydrALAZINE 50 MG tablet Commonly known as: APRESOLINE Take 75 mg by mouth 3 (three) times daily.   lamoTRIgine 150 MG tablet Commonly known as: LAMICTAL Take 1  tablet (150 mg total) by mouth 2 (two) times daily.   metFORMIN 500 MG tablet Commonly known as: GLUCOPHAGE Take 500 mg by mouth daily.   nitroGLYCERIN 0.4 MG SL tablet Commonly known as: NITROSTAT Place 0.4 mg under the tongue every 5 (five) minutes as needed. Chest pain   nystatin powder Generic drug: nystatin Apply 1 application topically 2 (two) times daily.   QUEtiapine 25 MG tablet Commonly known as: SEROQUEL Take 1 tablet (25 mg total) by mouth at bedtime.   tamsulosin 0.4 MG Caps capsule Commonly known as: FLOMAX Take 0.4 mg by mouth daily.   zinc oxide 20 % ointment Apply 1 application topically 2 (two) times daily.      Follow-up Information    Ihor AustinMcCue, [name], NP. Go on 10/24/2019.   Specialty: Neurology Contact information: [address] [place] [id number] [phone]            Time coordinating discharge: 39 minutes  Signed:  Anetha Slagel  [facility] 10/05/2019, 11:16 AM

## 2019-10-24 ENCOUNTER — Encounter: Payer: Self-pay | Admitting: Adult Health

## 2019-10-24 ENCOUNTER — Other Ambulatory Visit: Payer: Self-pay

## 2019-10-24 ENCOUNTER — Ambulatory Visit (INDEPENDENT_AMBULATORY_CARE_PROVIDER_SITE_OTHER): Payer: Medicare Other | Admitting: Adult Health

## 2019-10-24 VITALS — BP 138/82 | HR 61 | Temp 97.8°F | Ht 69.0 in | Wt 182.6 lb

## 2019-10-24 DIAGNOSIS — R569 Unspecified convulsions: Secondary | ICD-10-CM | POA: Diagnosis not present

## 2019-10-24 DIAGNOSIS — F01518 Vascular dementia, unspecified severity, with other behavioral disturbance: Secondary | ICD-10-CM

## 2019-10-24 DIAGNOSIS — E1159 Type 2 diabetes mellitus with other circulatory complications: Secondary | ICD-10-CM | POA: Diagnosis not present

## 2019-10-24 DIAGNOSIS — I680 Cerebral amyloid angiopathy: Secondary | ICD-10-CM

## 2019-10-24 DIAGNOSIS — Z79899 Other long term (current) drug therapy: Secondary | ICD-10-CM

## 2019-10-24 DIAGNOSIS — F0151 Vascular dementia with behavioral disturbance: Secondary | ICD-10-CM | POA: Diagnosis not present

## 2019-10-24 DIAGNOSIS — E785 Hyperlipidemia, unspecified: Secondary | ICD-10-CM

## 2019-10-24 DIAGNOSIS — I10 Essential (primary) hypertension: Secondary | ICD-10-CM | POA: Diagnosis not present

## 2019-10-24 NOTE — Progress Notes (Signed)
STROKE NEUROLOGY FOLLOW UP NOTE  NAME: Austin Sawyer DOB: 09/24/1935  REASON FOR VISIT: Seizure follow-up and medication management HISTORY FROM: chart and pt and wife  Chief Complaint  Patient presents with  . Follow-up    6 mon f/u. Caregiver present. Treatment room.   . Medication Refill    Norvasc ([facility])    HPI:  Update 10/24/2019: Austin Sawyer is a 85 year old male who is being seen today for seizure follow-up accompanied by his caregiver.  He was evaluated at [facility] on 10/02/2019 due to concern of seizure activity.  Apparently, his wife witnessed jerking and shaking movements that lasted for approximately 3 hours and then resolved.  He was also found to have AKI with Cre 2.95 and received IVF with improvement of Cre down to 1.68.  It was felt as though seizure activity in setting of AKI. Recommended to continue current AEDs but did make changes to antihypertensives due to AKI and was discharged home.  He has not had any reoccurring seizure events since that time.  He has continued on lamotrigine 150 mg twice daily and gabapentin 300 mg 3 times daily tolerating well without side effects.  Lamotrigine level obtained at prior visit satisfactory. Caregiver does report difficulty obtaining his prescription for amlodipine as she states the [place] has not received it and is requesting our office to send in a refill.  Caregiver reports stable cognition with baseline dementia and no concerns regarding behaviors with ongoing use of Seroquel.  He continues to live with his wife but does have daily caregiver assistance.  Continues to ambulate with a cane outdoors but no assistive device indoors and no recent falls.  Continues on atorvastatin 20 mg daily for secondary stroke prevention without side effects.  Blood pressure today 138/82.  No further concerns at this time.   Update 04/23/2019: Austin Sawyer is a 85 year old male who is being seen today for follow up regarding seizures and is accompanied  by his caregiver. He continues on lamotrigine 150mg  BID without recurrent seizure activity. Prior lamotrigine level satisfactory.  Continues on atorvastatin 20 mg daily for HLD and secondary stroke prevention. Blood pressure elevated today at 179/88, asymptomatic - not routinely monitored at home. He continues to receive caregiver assistance M-F for 8 hours.  Continues to ambulate with cane and denies recent falls.  No further concerns at this time.   History 10/23/2018: Patient is being seen today for seizure follow-up visit and medication management.  He is accompanied by his wife and caregiver.  He continues on lamotrigine 150 mg BID. Reported compliance by wife and caregiver. Wife does report recurrent seizure on 10/05/18 which lasted approx 4-5 minutes with reported shaking of his legs. No further events since prior appt and denies any recurrent seizure activity since 10/05/18.  He continues to be followed by [facility] for medication management.  Wife does report blood work was obtained approximately 2 weeks ago but is unsure if lamotrigine level was obtained.  Wife does state that prior to his recent seizure activity, patient was agitated and very upset and was questioning whether this could have led to his seizure activity.  Wife does endorse worsening of his memory and cognition over the past 6 months.  He does have aide assistance for 8 hours/day and his wife cares for him in the evening and overnight.  She does endorse patient sleeps well during the night but at times during the day, can have behavioral issues with aggression, paranoia and confusion.  Continues on atorvastatin without side effects myalgias.  Blood pressure 151/76. He does use cane for ambulation while outside. He does not need for indoor.  No further concerns at this time.  History summary Austin Sawyer is a 85 y.o. male with history of HTN, DM, CAD, atrial flutter on Coumadin before until 2011, alcohol abuse admitted on 02/08/15 for headache,  nausea vomiting, confusion, blurred vision. CT had showed left occipital ICH with small SAH, SDH and IVH. Put on 3% saline and admitted to ICU. He was then stabilized and off 3% saline. Stroke work up including MRA, CUS, 2D ehco, and EEG were negative. Repeat CT head stable. LDL 107. He was discharged to SNF after medically ready.   06/24/16 admission -right frontal ICH with midline shift in setting of hypertension due to noncompliance or CAA given recurrent ICHs.   02/21/17 admission-initial seizure activity: Pt was sitting in recliner, and had sudden onset eyes rolling back, LOC, shaking in all extremities, mouth foaming, lasted 3-4 min. 911 called, on arrival, pt started to wake up but still not able to communicate. However, in ED, pt was back to baseline. CT head negative for acute changes. Wife denies recent infection. UDA and UA negative. He was discharged from ED and requested follow up urgently in clinic.   05/24/17 follow up -recurrent seizure activity.  Pt has been doing the same. EEG diffuse slow mild, no seizure. However, he did not get lamictal from [place] since last visit. In 04/2017, he had another GTC at home and sent to [facility] ER. Dr. Leonel Sawyer started him on gabapentin 300mg  tid. He finished the medication and did not get refill from [place]. [facility] would like [place] to have further plan regarding AED use.   08/30/17 follow up Austin Sawyer: pt has been doing well except 06/2017 had one episode of mild seizure. EMS called but no need to go to ER. He was still on titrating phase of lamical at that time. Now his lamictal is at 100mg  bid. He was also started gabapentin 300mg  tid for neuropathy by his PCP. No more seizure episode since.   02/28/18 follow up [place]: During the interval time, pt had 2 episodes concerning for seizure. One was 3 months ago, pt was in standing position, wife noticed that pt became glazed eyes and staring off, people helped him sitting down, it lasted about 3-4 min. No shaking or jerking. Another one  was last Friday, he was standing in bathroom brushing his teeth, had both hand shaking, eyes rolling back, mouth smacking, and LOC. Family laid him down and called EMS. Episode lasted 3-4 min, and on EMS arrival his BP 200s/100s. No obvious post ictal after. Today his BP 154/76 but wife said his BP at home 100s/70s. He takes lamictal and gabapentin at home, but wife and caregiver not sure the dosage of lamictal at this time.  Family and caregiver also complain that patient sleeps a lot at home during the day for the last 2-3 months. He has good sleep at night but still sleepy during the day. On gabapentin 300mg  tid and seroquel 25mg  Qhs but seems have been on for a while. Family does not want to change this time.    04/21/18 visit: Since previous visit, patient was recently seen in the ED for seizure activity on 03/30/2018.  Patient was in his usual state of health and was ambulating to the bathroom when his wife found him down with a generalized tonic-clonic movements that lasted approximately 4 minutes.  Lamictal dose was previously increased from 100 mg twice daily and gradually increased to 150 mg twice daily on 02/28/2018.  Per hospital notes, patient states compliance with all seizure medications.  All imaging's were negative for acute abnormality.  UA was negative for infections.  No EKG changes or elevation in tropes.  Patient is being seen today for follow-up visit and is accompanied by his wife and caregiver.  He states since recent hospital admission, he has not had seizure activity.  He was not at full dose of Lamictal when he had the seizure activity that required hospitalization but since last week, he has been taking full dose of Lamictal 150 mg twice daily.  Patient is tolerating this well without side effects.  He does have a caregiver from Monday through Friday from 7 AM to 3 PM.  Overall patient states he feels he is doing well and has no concerns/complaints.      REVIEW OF SYSTEMS: Full 14  system review of systems performed and notable only for those listed below and in HPI above, all others are negative:  Seizure, walking difficulty and memory loss   Physical examination   Blood pressure 138/82, pulse 61, temperature 97.8 F (36.6 C), temperature source Oral, height 5\' 9"  (1.753 m), weight 182 lb 9.6 oz (82.8 kg).  General - Well nourished, well developed, pleasant elderly African-American male, in no apparent distress  Ophthalmologic - Fundi not visualized due to noncooperation.  Cardiovascular - Regular rate and rhythm.  Neurological exam Mental Status -  Awake and alert, orientated to self but disoriented to place and time.   Language exam showed fluent speech participated in limited conversation with caregiver providing majority of information. Able to follow some simple commands and cooperative with exam.  Cranial Nerves II - XII - II - not cooperative on exam, but able to blink bilaterally to visual threat III, IV, VI - eye movement intact. Left eye INO V - Facial sensation intact bilaterally. VII - Facial movement intact bilaterally. VIII - HOH bilaterally X - Palate elevates symmetrically. XI - Chin turning & shoulder shrug intact bilaterally. XII - Tongue protrusion intact.  Motor Strength - The patient's strength was normal in all extremities and pronator drift was absent.  Bulk was normal and fasciculations were absent.   Motor Tone -chronic mild left hemiparesis stable  Reflexes - The patient's reflexes were 1+ in all extremities and he had no pathological reflexes.  Sensory - Light touch, temperature/pinprick were assessed and were normal.    Coordination - The patient had normal movements in the hands and feet with no ataxia or dysmetria.  Tremor was absent.  Gait and Station - walk with cane, slow, cautions gait but steady.     Assessment: Ikey Omary is an 85 year old male with seizures s/p right frontal ICH on 06/24/2016 in setting of  hypertension due to noncompliance or CVA given recurrent lobar ICHs.  History of left occipital ICH with SAH, SDH and IVH on 02/08/2015.  First onset G TC 02/21/2017 and as medications were not started by [facility] as requested by wife/patient, another G TC 04/2017 with initiation of gabapentin and Lamictal.  Recurrent episode 06/2017 during titration of Lamictal.  Possible recurrent seizure versus syncopal events on 11/23/2017 and 02/20/2018.  He returned again to ED on 10/02/2019 with seizure-like activity in setting of AKI.  Vascular risk factors include dementia, HTN, HLD, DM, CAD, possible CAA and atrial flutter.      Plan:  -Continue lamotrigine 150  mg twice daily and gabapentin 300 mg 3 times daily for seizure prophylaxis -Lab work obtained today to monitor lamotrigine levels -recent lab work obtained during admission and family reports plans on repeating lab work with PCP in the near future -Dementia with behaviors stable with ongoing use of Seroquel - continue lipitor for stroke prevention - avoid antiplatelet due to concerns of CAA -Caregiver requesting a new prescription for amlodipine to be sent to the [facility].  Per review of epic, a full year prescription of amlodipine 10 mg daily was sent to the [place] upon hospital discharge.  Will attempt to reach out to the [place] and if they in fact had not received it for some reason, will place refill at this time but did advise caregiver that ongoing refills will need to be done managed by PCP.  She verbalized understanding. - [facility] with [facility] for stroke risk factor modification and seizure control. Recommend maintain blood pressure goal <140/80, diabetes with hemoglobin A1c goal below 6.5% and lipids with LDL cholesterol goal below 70 mg/dL.  Advised caregiver to monitor blood pressure at home to ensure satisfactory levels as today's blood pressure elevated.  Advised to proceed to ED if patient becomes symptomatic or blood pressure elevates  further   follow up in 6 months or call earlier if needed  I spent more than 30 minutes of face to face time with the patient and caregiver. Greater than 50% of time was spent in counseling and coordination of care and discussion regarding recent seizure activity in setting of AKI as well as discussing ongoing use of AEDs and answered all questions to patient and caregiver satisfaction   Ihor Austin, AGNP-BC  [facility] [address] [place], [place] [address]  Phone [phone] Fax [phone] Note: This document was prepared with digital dictation and possible smart phrase technology. Any transcriptional errors that result from this process are unintentional.

## 2019-10-24 NOTE — Patient Instructions (Signed)
Your Plan:  Continue lamotrigine 150 mg twice daily and gabapentin 300 mg 3 times daily for seizure prevention  Blood work today: Lamotrigine level  Refill will be placed for Lamictal and gabapentin  [facility] with [facility] for [facility] and management of underlying conditions    Follow-up in 6 months or call earlier if needed       Thank you for coming to see [place] at [facility]. I hope we have been able to provide you high quality care today.  You may receive a patient satisfaction survey over the next few weeks. We would appreciate your feedback and comments so that we may continue to improve ourselves and the health of our patients.

## 2019-10-24 NOTE — Progress Notes (Signed)
I agree with the above plan 

## 2019-10-25 ENCOUNTER — Telehealth: Payer: Self-pay | Admitting: Adult Health

## 2019-10-25 LAB — LAMOTRIGINE LEVEL: Lamotrigine Lvl: 6.6 ug/mL (ref 2.0–20.0)

## 2019-10-25 MED ORDER — AMLODIPINE BESYLATE 10 MG PO TABS: 10.0000 mg | ORAL_TABLET | Freq: Every day | ORAL | 0 refills | Status: DC

## 2019-10-25 NOTE — Telephone Encounter (Signed)
I called and spoke to wife, then [place] caregiver for pt.  Asking about amlodipine medication.  I relayed that will refill for 90 days for him, will fax to [facility] [phone].  Needs to make f/u with Dr. Jeri Lager [phone] at [facility]. for future refills.  She verbalized understanding. Fax confirmation received.

## 2019-10-25 NOTE — Telephone Encounter (Signed)
Pt's care taker Magda Bernheim called stating that when she called the [facility] his amLODipine (NORVASC) 10 MG tablet has not been sent in. Pt is needing his amLODipine (NORVASC) 10 MG tablet called or sent in to the [facility] Please advise.

## 2020-04-29 ENCOUNTER — Ambulatory Visit: Payer: Medicare Other | Admitting: Adult Health

## 2020-06-12 ENCOUNTER — Other Ambulatory Visit: Payer: Self-pay

## 2020-06-12 ENCOUNTER — Ambulatory Visit (INDEPENDENT_AMBULATORY_CARE_PROVIDER_SITE_OTHER): Payer: Medicare Other | Admitting: Adult Health

## 2020-06-12 ENCOUNTER — Encounter: Payer: Self-pay | Admitting: Adult Health

## 2020-06-12 VITALS — BP 149/80 | HR 58 | Ht 69.0 in | Wt 179.8 lb

## 2020-06-12 DIAGNOSIS — F0150 Vascular dementia without behavioral disturbance: Secondary | ICD-10-CM | POA: Diagnosis not present

## 2020-06-12 DIAGNOSIS — R569 Unspecified convulsions: Secondary | ICD-10-CM

## 2020-06-12 DIAGNOSIS — I611 Nontraumatic intracerebral hemorrhage in hemisphere, cortical: Secondary | ICD-10-CM | POA: Diagnosis not present

## 2020-06-12 NOTE — Progress Notes (Signed)
I agree with the above plan 

## 2020-06-12 NOTE — Progress Notes (Signed)
STROKE NEUROLOGY FOLLOW UP NOTE  NAME: Austin Sawyer DOB: [birth year]  REASON FOR VISIT: Seizure follow-up and medication management HISTORY FROM: chart, pt and aide  Chief Complaint  Patient presents with  . Follow-up    6 month f/u, states he has been doing well. Denies any no seizures.   . room 5    with aide    HPI:  Today, 06/12/2020, Austin Sawyer returns for seizure follow-up accompanied by his aide  He has been doing well since prior visit without any reoccurring seizure activity He has remained on lamotrigine 150mg  twice daily tolerating well without side effects  Cognition has been stable without behavioral concerns per aide.    No concerns at this time     History provided for reference purposes only Update 10/24/2019: Austin Sawyer is a 85 year old male who is being seen today for seizure follow-up accompanied by his caregiver.  He was evaluated at [facility] on 10/02/2019 due to concern of seizure activity.  Apparently, his wife witnessed jerking and shaking movements that lasted for approximately 3 hours and then resolved.  He was also found to have AKI with Cre 2.95 and received IVF with improvement of Cre down to 1.68.  It was felt as though seizure activity in setting of AKI. Recommended to continue current AEDs but did make changes to antihypertensives due to AKI and was discharged home.  He has not had any reoccurring seizure events since that time.  He has continued on lamotrigine 150 mg twice daily and gabapentin 300 mg 3 times daily tolerating well without side effects.  Lamotrigine level obtained at prior visit satisfactory. Caregiver does report difficulty obtaining his prescription for amlodipine as she states the 10/04/2019 has not received it and is requesting our office to send in a refill.  Caregiver reports stable cognition with baseline dementia and no concerns regarding behaviors with ongoing use of Seroquel.  He continues to live with his wife but does have daily caregiver  assistance.  Continues to ambulate with a cane outdoors but no assistive device indoors and no recent falls.  Continues on atorvastatin 20 mg daily for secondary stroke prevention without side effects.  Blood pressure today 138/82.  No further concerns at this time.  Update 04/23/2019: Austin Sawyer is a 85 year old male who is being seen today for follow up regarding seizures and is accompanied by his caregiver. He continues on lamotrigine 150mg  BID without recurrent seizure activity. Prior lamotrigine level satisfactory.  Continues on atorvastatin 20 mg daily for HLD and secondary stroke prevention. Blood pressure elevated today at 179/88, asymptomatic - not routinely monitored at home. He continues to receive caregiver assistance M-F for 8 hours.  Continues to ambulate with cane and denies recent falls.  No further concerns at this time.  History 10/23/2018: Patient is being seen today for seizure follow-up visit and medication management.  He is accompanied by his wife and caregiver.  He continues on lamotrigine 150 mg BID. Reported compliance by wife and caregiver. Wife does report recurrent seizure on 10/05/18 which lasted approx 4-5 minutes with reported shaking of his legs. No further events since prior appt and denies any recurrent seizure activity since 10/05/18.  He continues to be followed by [facility] for medication management.  Wife does report blood work was obtained approximately 2 weeks ago but is unsure if lamotrigine level was obtained.  Wife does state that prior to his recent seizure activity, patient was agitated and very upset and was questioning whether  this could have led to his seizure activity.  Wife does endorse worsening of his memory and cognition over the past 6 months.  He does have aide assistance for 8 hours/day and his wife cares for him in the evening and overnight.  She does endorse patient sleeps well during the night but at times during the day, can have behavioral issues with aggression,  paranoia and confusion.  Continues on atorvastatin without side effects myalgias.  Blood pressure 151/76. He does use cane for ambulation while outside. He does not need for indoor.  No further concerns at this time.  History summary [name] is a 85 y.o. male with history of HTN, DM, CAD, atrial flutter on Coumadin before until 2011, alcohol abuse admitted on 02/08/15 for headache, nausea vomiting, confusion, blurred vision. CT had showed left occipital ICH with small SAH, SDH and IVH. Put on 3% saline and admitted to ICU. He was then stabilized and off 3% saline. Stroke work up including MRA, CUS, 2D ehco, and EEG were negative. Repeat CT head stable. LDL 107. He was discharged to SNF after medically ready.   06/24/16 admission -right frontal ICH with midline shift in setting of hypertension due to noncompliance or CAA given recurrent ICHs.   02/21/17 admission-initial seizure activity: Pt was sitting in recliner, and had sudden onset eyes rolling back, LOC, shaking in all extremities, mouth foaming, lasted 3-4 min. 911 called, on arrival, pt started to wake up but still not able to communicate. However, in ED, pt was back to baseline. CT head negative for acute changes. Wife denies recent infection. UDA and UA negative. He was discharged from ED and requested follow up urgently in clinic.   05/24/17 follow up -recurrent seizure activity.  Pt has been doing the same. EEG diffuse slow mild, no seizure. However, he did not get lamictal from [place] since last visit. In 04/2017, he had another GTC at home and sent to [facility] ER. Dr. Amada Jupiter started him on gabapentin 300mg  tid. He finished the medication and did not get refill from . [facility] would like [place] to have further plan regarding AED use.   08/30/17 follow up Austin Sawyer: pt has been doing well except 06/2017 had one episode of mild seizure. EMS called but no need to go to ER. He was still on titrating phase of lamical at that time. Now his lamictal is at 100mg  bid.  He was also started gabapentin 300mg  tid for neuropathy by his PCP. No more seizure episode since.   02/28/18 follow up Austin Sawyer: During the interval time, pt had 2 episodes concerning for seizure. One was 3 months ago, pt was in standing position, wife noticed that pt became glazed eyes and staring off, people helped him sitting down, it lasted about 3-4 min. No shaking or jerking. Another one was last Friday, he was standing in bathroom brushing his teeth, had both hand shaking, eyes rolling back, mouth smacking, and LOC. Family laid him down and called EMS. Episode lasted 3-4 min, and on EMS arrival his BP 200s/100s. No obvious post ictal after. Today his BP 154/76 but wife said his BP at home 100s/70s. He takes lamictal and gabapentin at home, but wife and caregiver not sure the dosage of lamictal at this time.  Family and caregiver also complain that patient sleeps a lot at home during the day for the last 2-3 months. He has good sleep at night but still sleepy during the day. On gabapentin 300mg  tid and seroquel 25mg   Qhs but seems have been on for a while. Family does not want to change this time.    04/21/18 visit: Since previous visit, patient was recently seen in the ED for seizure activity on 03/30/2018.  Patient was in his usual state of health and was ambulating to the bathroom when his wife found him down with a generalized tonic-clonic movements that lasted approximately 4 minutes.  Lamictal dose was previously increased from 100 mg twice daily and gradually increased to 150 mg twice daily on 02/28/2018.  Per hospital notes, patient states compliance with all seizure medications.  All imaging's were negative for acute abnormality.  UA was negative for infections.  No EKG changes or elevation in tropes.  Patient is being seen today for follow-up visit and is accompanied by his wife and caregiver.  He states since recent hospital admission, he has not had seizure activity.  He was not at full dose of  Lamictal when he had the seizure activity that required hospitalization but since last week, he has been taking full dose of Lamictal 150 mg twice daily.  Patient is tolerating this well without side effects.  He does have a caregiver from Monday through Friday from 7 AM to 3 PM.  Overall patient states he feels he is doing well and has no concerns/complaints.      REVIEW OF SYSTEMS: Full 14 system review of systems performed and notable only for those listed in HPI above, all others are negative   Current Outpatient Medications on File Prior to Visit  Medication Sig Dispense Refill  . allopurinol (ZYLOPRIM) 300 MG tablet Take 300 mg by mouth daily. For gout    . amLODipine (NORVASC) 10 MG tablet Take 1 tablet (10 mg total) by mouth daily. 90 tablet 0  . atorvastatin (LIPITOR) 20 MG tablet Take 1 tablet (20 mg total) by mouth daily at 6 PM.    . camphor-menthol (SARNA) lotion Apply 1 application topically 2 (two) times daily.    . carvedilol (COREG) 12.5 MG tablet Take 12.5 mg by mouth 2 (two) times daily with a meal. Patient takes 12.5 mg at lunch and 12.5 mg at dinner.    . clotrimazole (LOTRIMIN) 1 % cream Apply 1 application topically 2 (two) times daily.    . fluocinonide cream (LIDEX) 0.05 % Apply 1 application topically 2 (two) times daily.    Marland Kitchen. gabapentin (NEURONTIN) 300 MG capsule Take 300 mg by mouth 3 (three) times daily.    . hydrALAZINE (APRESOLINE) 50 MG tablet Take 75 mg by mouth 3 (three) times daily.     Marland Kitchen. lamoTRIgine (LAMICTAL) 150 MG tablet Take 1 tablet (150 mg total) by mouth 2 (two) times daily. 180 tablet 3  . metFORMIN (GLUCOPHAGE) 500 MG tablet Take 500 mg by mouth daily.     Marland Kitchen. nystatin (NYSTATIN) powder Apply 1 application topically 2 (two) times daily.    . QUEtiapine (SEROQUEL) 25 MG tablet Take 1 tablet (25 mg total) by mouth at bedtime. 30 tablet 1  . tamsulosin (FLOMAX) 0.4 MG CAPS capsule Take 0.4 mg by mouth daily.     Marland Kitchen. zinc oxide 20 % ointment Apply 1  application topically 2 (two) times daily.     No current facility-administered medications on file prior to visit.    Past Medical History:  Diagnosis Date  . Alcohol abuse, in remission   . Atrial flutter (HCC)   . Back pain   . Cervicalgia   . Coronary artery disease   .  Coronary atherosclerosis of unspecified type of vessel, native or graft   . Dementia (HCC)   . Depression   . Diabetes mellitus without complication (HCC)   . Disturbance of skin sensation   . Hypertension   . MI, old   . Reflux   . Seizures (HCC)   . Stroke (HCC)   . Unspecified hearing loss   . Unspecified sleep apnea    Past Surgical History:  Procedure Laterality Date  . HERNIA REPAIR    . IR GENERIC HISTORICAL  07/16/2016   IR REPLC GASTRO/COLONIC TUBE PERCUT W/FLUORO 07/16/2016 Irish Lack, MD [facility]-INTERV RAD  . IR GENERIC HISTORICAL  09/07/2016   IR GASTROSTOMY TUBE REMOVAL 09/07/2016 [facility]-INTERV RAD  . PEG PLACEMENT N/A 07/02/2016   Procedure: PERCUTANEOUS ENDOSCOPIC GASTROSTOMY (PEG) PLACEMENT;  Surgeon: Violeta Gelinas, MD;  Location[facility] ENDOSCOPY;  Service: Endoscopy;  Laterality: N/A;  . ROTATOR CUFF REPAIR         Physical examination Today's Vitals   06/12/20 1242  BP: (!) 149/80  Pulse: (!) 58  Weight: 179 lb 12.8 oz (81.6 kg)  Height:  (1.753 m)   Body mass index is 26.55 kg/m.   General - Well nourished, well developed, very pleasant elderly African-American male, in no apparent distress Ophthalmologic - Fundi not visualized due to noncooperation. Cardiovascular - Regular rate and rhythm.  Neurological exam Mental Status -  Awake and alert, orientated to self but disoriented to place and time.   Language exam showed fluent speech participated in limited conversation with caregiver providing majority of information. Able to follow some simple commands and cooperative with exam.  Cranial Nerves II - XII - II - not cooperative on exam, but able to blink bilaterally to  visual threat III, IV, VI - eye movement intact. Left eye INO V - Facial sensation intact bilaterally. VII - Facial movement intact bilaterally. VIII - HOH bilaterally X - Palate elevates symmetrically. XI - Chin turning & shoulder shrug intact bilaterally. XII - Tongue protrusion intact.  Motor Strength - The patient's strength was normal in all extremities and pronator drift was absent.  Bulk was normal and fasciculations were absent.   Motor Tone -chronic mild left hemiparesis stable  Reflexes - The patient's reflexes were 1+ in all extremities and he had no pathological reflexes.  Sensory - Light touch, temperature/pinprick were assessed and were normal.    Coordination - The patient had normal movements in the hands and feet with no ataxia or dysmetria.  Tremor was absent.  Gait and Station - walk with cane, slow, cautions gait but steady.     Assessment: Austin Sawyer is an 85 year old male with seizures s/p right frontal ICH on 06/24/2016 in setting of hypertension due to noncompliance or CVA given recurrent lobar ICHs.  History of left occipital ICH with SAH, SDH and IVH on 02/08/2015.  First onset G TC 02/21/2017 and as medications were not started by [facility] as requested by wife/patient, another G TC 04/2017 with initiation of gabapentin and Lamictal.  Recurrent episode 06/2017 during titration of Lamictal.  Possible recurrent seizure versus syncopal events on 11/23/2017 and 02/20/2018.  He returned again to ED on 10/02/2019 with seizure-like activity in setting of AKI.  Vascular risk factors include dementia, HTN, HLD, DM, CAD, possible CAA and atrial flutter.      Plan:  -Continue lamotrigine 150 mg twice daily for seizure prophylaxis -[facility] -Lamotrigine level previously obtained which was satisfactory -Cognition stable managed by PCP -  Follow up with your [facility] physician for stroke risk factor modification and management.  Recommend maintain blood pressure goal <140/80,  diabetes with hemoglobin A1c goal below 6.5% and lipids with LDL cholesterol goal below 70 mg/dL and ongoing use of Crestor.    Per request, follow-up in 1 year or call earlier if needed  I spent 20 minutes of face-to-face and non-face-to-face time with patient and aide.  This included previsit chart review, lab review, study review, order entry, electronic health record documentation, patient education regarding seizures and ongoing use of AED, history of prior strokes, cognitive impairment and answered all questions to patient and aides satisfaction   Ihor Austin, [facility]  [facility] [address] [place], [place] [address]  Phone [phone] Fax [phone] Note: This document was prepared with digital dictation and possible smart phrase technology. Any transcriptional errors that result from this process are unintentional.

## 2020-06-12 NOTE — Patient Instructions (Signed)
Your Plan:  Continue lamictal 150mg  twice daily and gabapentin 300mg  three times daily for seizure prevention    Recommend follow up in 1 year or call earlier if needed     Thank you for coming to see at [facility]. I hope we have been able to provide you high quality care today.  You may receive a patient satisfaction survey over the next few weeks. We would appreciate your feedback and comments so that we may continue to improve ourselves and the health of our patients.

## 2021-06-18 ENCOUNTER — Ambulatory Visit: Payer: Medicare Other | Admitting: Adult Health

## 2021-07-08 ENCOUNTER — Encounter: Payer: Self-pay | Admitting: Adult Health

## 2021-07-08 ENCOUNTER — Other Ambulatory Visit: Payer: Self-pay

## 2021-07-08 ENCOUNTER — Ambulatory Visit (INDEPENDENT_AMBULATORY_CARE_PROVIDER_SITE_OTHER): Payer: Medicare Other | Admitting: Adult Health

## 2021-07-08 VITALS — BP 126/71 | HR 62 | Ht 69.0 in | Wt 181.0 lb

## 2021-07-08 DIAGNOSIS — R569 Unspecified convulsions: Secondary | ICD-10-CM | POA: Diagnosis not present

## 2021-07-08 DIAGNOSIS — F01518 Vascular dementia, unspecified severity, with other behavioral disturbance: Secondary | ICD-10-CM

## 2021-07-08 DIAGNOSIS — I611 Nontraumatic intracerebral hemorrhage in hemisphere, cortical: Secondary | ICD-10-CM | POA: Diagnosis not present

## 2021-07-08 NOTE — Progress Notes (Signed)
STROKE NEUROLOGY FOLLOW UP NOTE  NAME: Austin [facility] DOB: 1935-09-14  REASON FOR VISIT: Seizure follow-up and medication management HISTORY FROM: chart, pt and aide  Chief Complaint  Patient presents with   Follow-up    Rm 2 with thelma wife & caregiver christina   Pt is well and stable, no seizures since last visit       HPI:  Austin [facility] is an 86 year old male with seizures s/p right frontal ICH on 06/24/2016 in setting of hypertension due to noncompliance or CVA given recurrent lobar ICHs.  History of left occipital ICH with SAH, SDH and IVH on 02/08/2015.  First onset G TC 02/21/2017 and as medications were not started by Austin [facility] as requested by wife/patient, another G TC 04/2017 with initiation of gabapentin and Lamictal.  Recurrent episode 06/2017 during titration of Lamictal.  Possible recurrent seizure versus syncopal events on 11/23/2017 and 02/20/2018.  He returned again to ED on 10/02/2019 with seizure-like activity in setting of AKI.  Vascular risk factors include dementia, HTN, HLD, DM, CAD, possible CAA and atrial flutter.  He is routinely followed in office for seizure monitoring per patient request although medication and labs maintained by Austin [facility]yer    Update 07/08/2021 JM: Returns for yearly seizure follow-up accompanied by his wife and caregiver, [name].  Overall stable.  On lamotrigine 150 mg twice daily without side effects and no seizure activity.  Lab work recently completed by Austin [facility]yer which was satisfactory.  Cognition relatively stable but can fluctuate - at times behaviors with agitation and aggression - usually occurs when he cannot get what he wants at that exact time. No sleep related concerns. No paranoia or hallucinations. Wife unsure if PCP monitoring     History provided for reference purposes only Update 06/12/2020 JM: Austin [facility]yer returns for seizure follow-up accompanied by his aide  He has been doing well since prior visit without any reoccurring seizure  activity He has remained on lamotrigine 150mg  twice daily tolerating well without side effects  Cognition has been stable without behavioral concerns per aide.    No concerns at this time   Update 10/24/2019: Austin [facility]yer is a 86 year old male who is being seen today for seizure follow-up accompanied by his caregiver.  He was evaluated at [facility] on 10/02/2019 due to concern of seizure activity.  Apparently, his wife witnessed jerking and shaking movements that lasted for approximately 3 hours and then resolved.  He was also found to have AKI with Cre 2.95 and received IVF with improvement of Cre down to 1.68.  It was felt as though seizure activity in setting of AKI. Recommended to continue current AEDs but did make changes to antihypertensives due to AKI and was discharged home.  He has not had any reoccurring seizure events since that time.  He has continued on lamotrigine 150 mg twice daily and gabapentin 300 mg 3 times daily tolerating well without side effects.  Lamotrigine level obtained at prior visit satisfactory. Caregiver does report difficulty obtaining his prescription for amlodipine as she states the Austin [facility]yer has not received it and is requesting our office to send in a refill.  Caregiver reports stable cognition with baseline dementia and no concerns regarding behaviors with ongoing use of Seroquel.  He continues to live with his wife but does have daily caregiver assistance.  Continues to ambulate with a cane outdoors but no assistive device indoors and no recent falls.  Continues on atorvastatin 20 mg daily for secondary stroke prevention without side effects.  Blood pressure today 138/82.  No further concerns at this time.  Update 04/23/2019: Austin [facility]yer is a 86 year old male who is being seen today for follow up regarding seizures and is accompanied by his caregiver. He continues on lamotrigine 150mg  BID without recurrent seizure activity. Prior lamotrigine level satisfactory.  Continues on  atorvastatin 20 mg daily for HLD and secondary stroke prevention. Blood pressure elevated today at 179/88, asymptomatic - not routinely monitored at home. He continues to receive caregiver assistance M-F for 8 hours.  Continues to ambulate with cane and denies recent falls.  No further concerns at this time.  History 10/23/2018: Patient is being seen today for seizure follow-up visit and medication management.  He is accompanied by his wife and caregiver.  He continues on lamotrigine 150 mg BID. Reported compliance by wife and caregiver. Wife does report recurrent seizure on 10/05/18 which lasted approx 4-5 minutes with reported shaking of his legs. No further events since prior appt and denies any recurrent seizure activity since 10/05/18.  He continues to be followed by Austin [facility] for medication management.  Wife does report blood work was obtained approximately 2 weeks ago but is unsure if lamotrigine level was obtained.  Wife does state that prior to his recent seizure activity, patient was agitated and very upset and was questioning whether this could have led to his seizure activity.  Wife does endorse worsening of his memory and cognition over the past 6 months.  He does have aide assistance for 8 hours/day and his wife cares for him in the evening and overnight.  She does endorse patient sleeps well during the night but at times during the day, can have behavioral issues with aggression, paranoia and confusion.  Continues on atorvastatin without side effects myalgias.  Blood pressure 151/76. He does use cane for ambulation while outside. He does not need for indoor.  No further concerns at this time.  History summary Austin [facility] is a 86 y.o. male with history of HTN, DM, CAD, atrial flutter on Coumadin before until 2011, alcohol abuse admitted on 02/08/15 for headache, nausea vomiting, confusion, blurred vision. CT had showed left occipital ICH with small SAH, SDH and IVH. Put on 3% saline and admitted to ICU. He  was then stabilized and off 3% saline. Stroke work up including MRA, CUS, 2D ehco, and EEG were negative. Repeat CT head stable. LDL 107. He was discharged to SNF after medically ready.   06/24/16 admission -right frontal ICH with midline shift in setting of hypertension due to noncompliance or CAA given recurrent ICHs.   02/21/17 admission-initial seizure activity: Pt was sitting in recliner, and had sudden onset eyes rolling back, LOC, shaking in all extremities, mouth foaming, lasted 3-4 min. 911 called, on arrival, pt started to wake up but still not able to communicate. However, in ED, pt was back to baseline. CT head negative for acute changes. Wife denies recent infection. UDA and UA negative. He was discharged from ED and requested follow up urgently in clinic.   05/24/17 follow up -recurrent seizure activity.  Pt has been doing the same. EEG diffuse slow mild, no seizure. However, he did not get lamictal from 05/26/17 since last visit. In 04/2017, he had another GTC at home and sent to [facility]. [facility]. [place] [facility] started him on gabapentin 300mg  tid. He finished the medication and did not get refill from [facility]. Austin [facility] would like to have further plan regarding AED use.   08/30/17 follow up JX:  pt has been doing well except 06/2017 had one episode of mild seizure. EMS called but no need to go to ER. He was still on titrating phase of lamical at that time. Now his lamictal is at 100mg  bid. He was also started gabapentin 300mg  tid for neuropathy by his PCP. No more seizure episode since.   02/28/18 follow up JX: During the interval time, pt had 2 episodes concerning for seizure. One was 3 months ago, pt was in standing position, wife noticed that pt became glazed eyes and staring off, people helped him sitting down, it lasted about 3-4 min. No shaking or jerking. Another one was last Friday, he was standing in bathroom brushing his teeth, had both hand shaking, eyes rolling back, mouth smacking, and LOC. Family laid him  down and called EMS. Episode lasted 3-4 min, and on EMS arrival his BP 200s/100s. No obvious post ictal after. Today his BP 154/76 but wife said his BP at home 100s/70s. He takes lamictal and gabapentin at home, but wife and caregiver not sure the dosage of lamictal at this time.  Family and caregiver also complain that patient sleeps a lot at home during the day for the last 2-3 months. He has good sleep at night but still sleepy during the day. On gabapentin 300mg  tid and seroquel 25mg  Qhs but seems have been on for a while. Family does not want to change this time.    04/21/18 visit: Since previous visit, patient was recently seen in the ED for seizure activity on 03/30/2018.  Patient was in his usual state of health and was ambulating to the bathroom when his wife found him down with a generalized tonic-clonic movements that lasted approximately 4 minutes.  Lamictal dose was previously increased from 100 mg twice daily and gradually increased to 150 mg twice daily on 02/28/2018.  Per [facility] notes, patient states compliance with all seizure medications.  All imaging's were negative for acute abnormality.  UA was negative for infections.  No EKG changes or elevation in tropes.  Patient is being seen today for follow-up visit and is accompanied by his wife and caregiver.  He states since recent [facility] admission, he has not had seizure activity.  He was not at full dose of Lamictal when he had the seizure activity that required hospitalization but since last week, he has been taking full dose of Lamictal 150 mg twice daily.  Patient is tolerating this well without side effects.  He does have a caregiver from Monday through Friday from 7 AM to 3 PM.  Overall patient states he feels he is doing well and has no concerns/complaints.      REVIEW OF SYSTEMS: Full 14 system review of systems performed and notable only for those listed in HPI above, all others are negative   Current Outpatient Medications on  File Prior to Visit  Medication Sig Dispense Refill   allopurinol (ZYLOPRIM) 300 MG tablet Take 300 mg by mouth daily. For gout     amLODipine (NORVASC) 10 MG tablet Take 1 tablet (10 mg total) by mouth daily. 90 tablet 0   atorvastatin (LIPITOR) 20 MG tablet Take 1 tablet (20 mg total) by mouth daily at 6 PM.     camphor-menthol (SARNA) lotion Apply 1 application topically 2 (two) times daily.     carvedilol (COREG) 12.5 MG tablet Take 12.5 mg by mouth 2 (two) times daily with a meal. Patient takes 12.5 mg at lunch and 12.5 mg at dinner.  clotrimazole (LOTRIMIN) 1 % cream Apply 1 application topically 2 (two) times daily.     fluocinonide cream (LIDEX) 0.05 % Apply 1 application topically 2 (two) times daily.     gabapentin (NEURONTIN) 300 MG capsule Take 300 mg by mouth 3 (three) times daily.     hydrALAZINE (APRESOLINE) 50 MG tablet Take 75 mg by mouth 3 (three) times daily.      lamoTRIgine (LAMICTAL) 150 MG tablet Take 1 tablet (150 mg total) by mouth 2 (two) times daily. 180 tablet 3   metFORMIN (GLUCOPHAGE) 500 MG tablet Take 500 mg by mouth daily.      nystatin (MYCOSTATIN/NYSTOP) powder Apply 1 application topically 2 (two) times daily.     QUEtiapine (SEROQUEL) 25 MG tablet Take 1 tablet (25 mg total) by mouth at bedtime. 30 tablet 1   tamsulosin (FLOMAX) 0.4 MG CAPS capsule Take 0.4 mg by mouth daily.      zinc oxide 20 % ointment Apply 1 application topically 2 (two) times daily.     No current facility-administered medications on file prior to visit.    Past Medical History:  Diagnosis Date   Alcohol abuse, in remission    Atrial flutter (HCC)    Back pain    Cervicalgia    Coronary artery disease    Coronary atherosclerosis of unspecified type of vessel, native or graft    Dementia (HCC)    Depression    Diabetes mellitus without complication (HCC)    Disturbance of skin sensation    Hypertension    MI, old    Reflux    Seizures (HCC)    Stroke (HCC)     Unspecified hearing loss    Unspecified sleep apnea    Past Surgical History:  Procedure Laterality Date   HERNIA REPAIR     IR GENERIC HISTORICAL  07/16/2016   IR REPLC GASTRO/COLONIC TUBE PERCUT W/FLUORO 07/16/2016 Irish Lack, MD [facility]   IR GENERIC HISTORICAL  09/07/2016   IR GASTROSTOMY TUBE REMOVAL 09/07/2016 [facility]   PEG PLACEMENT N/A 07/02/2016   Procedure: PERCUTANEOUS ENDOSCOPIC GASTROSTOMY (PEG) PLACEMENT;  Surgeon: Violeta Gelinas, MD;  Location: [facility];  Service: Endoscopy;  Laterality: N/A;   ROTATOR CUFF REPAIR         Physical examination Today's Vitals   07/08/21 0937  BP: 126/71  Pulse: 62  Weight: 181 lb (82.1 kg)  Height: 5\' 9"  (1.753 m)    Body mass index is 26.73 kg/m.  General - Well nourished, well developed, very pleasant elderly African-American male, in no apparent distress Ophthalmologic - Fundi not visualized due to noncooperation. Cardiovascular - Regular rate and rhythm.  Neurological exam Mental Status -  Awake and alert, orientated to self but disoriented to place and time.   Language exam showed fluent speech participated in limited conversation with caregiver and wife providing majority of information. Able to follow some simple commands and cooperative with exam.  Cranial Nerves II - XII - II - not cooperative on exam, but able to blink bilaterally to visual threat III, IV, VI - eye movement intact. Left eye INO V - Facial sensation intact bilaterally. VII - Facial movement intact bilaterally. VIII -severe HOH bilaterally X - Palate elevates symmetrically. XI - Chin turning & shoulder shrug intact bilaterally. XII - Tongue protrusion intact.  Motor Strength - The patient's strength was normal in all extremities and pronator drift was absent.  Bulk was normal and fasciculations were absent.   Motor Tone -chronic  mild left hemiparesis stable  Reflexes - The patient's reflexes were 1+ in all extremities and he had  no pathological reflexes.  Sensory - Light touch, temperature/pinprick were assessed and were normal.    Coordination - The patient had normal movements in the hands and feet with no ataxia or dysmetria.  Tremor was absent.  Gait and Station - walk with cane, slow, cautions gait but steady.    Pertinent labs:                    Assessment: Dayven Linsley is an 86 year old male with seizures s/p right frontal ICH on 06/24/2016 in setting of hypertension due to noncompliance or CVA given recurrent lobar ICHs.  History of left occipital ICH with SAH, SDH and IVH on 02/08/2015.  First onset G TC 02/21/2017 and as medications were not started by [facility] as requested by wife/patient, another G TC 04/2017 with initiation of gabapentin and Lamictal.  Recurrent episode 06/2017 during titration of Lamictal.  Possible recurrent seizure versus syncopal events on 11/23/2017 and 02/20/2018.  He returned again to ED on 10/02/2019 with seizure-like activity in setting of AKI.  Vascular risk factors include dementia, HTN, HLD, DM, CAD, possible CAA and atrial flutter.       Plan:  -Continue lamotrigine 150 mg twice daily for seizure prophylaxis -refills provided by Austin [facility] -Lab work completed by [employer] (as above) - all satisfactory -Cognition stable per wife - defer ongoing monitoring and management to PCP who has been managing - Follow up with your Austin [facility]yer physician for stroke risk factor modification and management.  Recommend maintain blood pressure goal <140/80, diabetes with hemoglobin A1c goal below 6.5% and lipids with LDL cholesterol goal below 70 mg/dL and ongoing use of Crestor.    Per request, follow-up in 1 year or call earlier if needed   I spent 26 minutes of face-to-face and non-face-to-face time with patient, wife and aide.  This included previsit chart review, lab review, study review, order entry, electronic health record documentation, patient and wife in 8 education regarding seizures and ongoing  use of AED, history of prior strokes, cognitive impairment and answered all questions to patient, wife and aides satisfaction   Ihor Austin, [facility]  [facility] [address] [place], [place] [address]  Phone [phone] Fax [phone] Note: This document was prepared with digital dictation and possible smart phrase technology. Any transcriptional errors that result from this process are unintentional.

## 2021-07-08 NOTE — Patient Instructions (Signed)
No changes today - continue current treatment plan   Follow up in 1 year or call earlier if needed

## 2022-01-12 ENCOUNTER — Emergency Department (HOSPITAL_COMMUNITY)
Admission: EM | Admit: 2022-01-12 | Discharge: 2022-01-12 | Disposition: A | Discharge status: Home / Self Care | Payer: Medicare Other | Attending: Emergency Medicine | Admitting: Emergency Medicine

## 2022-01-12 ENCOUNTER — Emergency Department (HOSPITAL_COMMUNITY): Payer: Medicare Other

## 2022-01-12 DIAGNOSIS — Z79899 Other long term (current) drug therapy: Secondary | ICD-10-CM | POA: Insufficient documentation

## 2022-01-12 DIAGNOSIS — I10 Essential (primary) hypertension: Secondary | ICD-10-CM | POA: Insufficient documentation

## 2022-01-12 DIAGNOSIS — E1159 Type 2 diabetes mellitus with other circulatory complications: Secondary | ICD-10-CM | POA: Diagnosis not present

## 2022-01-12 DIAGNOSIS — Z7984 Long term (current) use of oral hypoglycemic drugs: Secondary | ICD-10-CM | POA: Diagnosis not present

## 2022-01-12 DIAGNOSIS — M545 Low back pain, unspecified: Secondary | ICD-10-CM | POA: Diagnosis present

## 2022-01-12 NOTE — Discharge Instructions (Addendum)
If your low back pain persist you can talk your primary care doctor about arranging for an MRI of the lumbar spine as an outpatient. ?

## 2022-01-12 NOTE — ED Triage Notes (Incomplete)
Pt c/o L sided back/hip pain. Denies abd pain, urinary symptoms,  ? ?CBD lotion & massage gun to area, pt voices no relief ?

## 2022-01-12 NOTE — ED Provider Notes (Signed)
?[facility] EMERGENCY DEPARTMENT ?Provider Note ? ? ?CSN: [id number] ?Arrival date & time: 01/12/22  1639 ? ?  ? ?History ? ?Chief Complaint  ?Patient presents with  ? Back Pain  ? ? ?Austin [facility] is a 87 y.o. male presented to the ER for low back pain.  He is accompanied by his son.  The patient had gradual onset of pain towards his left buttock about 3 days ago, no traumas or falls.  It is not waxing or waning.  They have been giving him Tylenol.  He takes gabapentin 300 mg 3 times daily.  His son wanted to come get checked out some x-rays "just make sure everything is okay". ? ?HPI ? ?  ? ?Home Medications ?Prior to Admission medications   ?Medication Sig Start Date End Date Taking? Authorizing Provider  ?allopurinol (ZYLOPRIM) 300 MG tablet Take 300 mg by mouth daily. For gout    [provider]  ?amLODipine (NORVASC) 10 MG tablet Take 1 tablet (10 mg total) by mouth daily. 10/25/19 07/08/21  Ihor Austin, NP  ?atorvastatin (LIPITOR) 20 MG tablet Take 1 tablet (20 mg total) by mouth daily at 6 PM. 07/02/16   Layne Benton, NP  ?camphor-menthol [facility]) lotion Apply 1 application topically 2 (two) times daily.    [provider]  ?carvedilol (COREG) 12.5 MG tablet Take 12.5 mg by mouth 2 (two) times daily with a meal. Patient takes 12.5 mg at lunch and 12.5 mg at dinner.    [provider]  ?clotrimazole (LOTRIMIN) 1 % cream Apply 1 application topically 2 (two) times daily.    [provider]  ?fluocinonide cream (LIDEX) 0.05 % Apply 1 application topically 2 (two) times daily.    [provider]  ?gabapentin (NEURONTIN) 300 MG capsule Take 300 mg by mouth 3 (three) times daily.    [provider]  ?hydrALAZINE (APRESOLINE) 50 MG tablet Take 75 mg by mouth 3 (three) times daily.     [provider]  ?lamoTRIgine (LAMICTAL) 150 MG tablet Take 1 tablet (150 mg total) by mouth 2 (two) times daily. 04/23/19   Ihor Austin, NP  ?metFORMIN  (GLUCOPHAGE) 500 MG tablet Take 500 mg by mouth daily.     [provider]  ?nystatin (MYCOSTATIN/NYSTOP) powder Apply 1 application topically 2 (two) times daily.    [provider]  ?QUEtiapine (SEROQUEL) 25 MG tablet Take 1 tablet (25 mg total) by mouth at bedtime. 02/21/15   Rinehuls, Kinnie Scales, PA-C  ?tamsulosin (FLOMAX) 0.4 MG CAPS capsule Take 0.4 mg by mouth daily.     [provider]  ?zinc oxide 20 % ointment Apply 1 application topically 2 (two) times daily.    [provider]  ?   ? ?Allergies    ?Patient has no known allergies.   ? ?Review of Systems   ?Review of Systems ? ?Physical Exam ?Updated Vital Signs ?BP 110/77 (BP Location: Right Arm)   Pulse 69   Temp 98.8 ?F (37.1 ?C)   Resp 16   SpO2 98%  ?Physical Exam ?Constitutional:   ?   General: He is not in acute distress. ?   Comments: Very hard of hearing  ?HENT:  ?   Head: Normocephalic and atraumatic.  ?Eyes:  ?   Conjunctiva/sclera: Conjunctivae normal.  ?   Pupils: Pupils are equal, round, and reactive to light.  ?Cardiovascular:  ?   Rate and Rhythm: Normal rate and regular rhythm.  ?Abdominal:  ?  General: There is no distension.  ?   Tenderness: There is no abdominal tenderness.  ?   Comments: No pulsatile abdominal mass  ?Musculoskeletal:  ?   Comments: Tenderness along the left posterior iliac fossa and the left buttock ?No spinal midline tenderness ?Patient is amatory in the ED, good range of motion of the hip  ?Skin: ?   General: Skin is warm and dry.  ?Neurological:  ?   General: No focal deficit present.  ?   Mental Status: He is alert. Mental status is at baseline.  ?Psychiatric:     ?   Mood and Affect: Mood normal.     ?   Behavior: Behavior normal.  ? ? ?ED Results / Procedures / Treatments   ?Labs ?(all labs ordered are listed, but only abnormal results are displayed) ?Labs Reviewed - No data to display ? ?EKG ?None ? ?Radiology ?DG Lumbar Spine 2-3 Views ? ?Result Date: 01/12/2022 ?CLINICAL  DATA:  Back and hip pain. EXAM: LUMBAR SPINE - 2-3 VIEW COMPARISON:  Lumbar radiograph 03/13/2016 FINDINGS: There are 5 non-rib-bearing lumbar type vertebra. The similar straightening of normal lordosis. No evidence of acute fracture or vertebral body compression. Diffuse spondylosis with anterior spurring and mild diffuse disc space narrowing. Moderate facet hypertrophy most prominent at L5-S1. No visible bony destruction. Sacroiliac joints are congruent with moderate degenerative change. Aortic atherosclerosis. IMPRESSION: 1. Diffuse degenerative change throughout the lumbar spine. 2. No acute findings by radiograph. Electronically Signed   By: Narda Rutherford M.D.   On: 01/12/2022 18:37  ? ?DG Hip Unilat W or Wo Pelvis 2-3 Views Left ? ?Result Date: 01/12/2022 ?CLINICAL DATA:  Back and hip pain. EXAM: DG HIP (WITH OR WITHOUT PELVIS) 2-3V LEFT COMPARISON:  None. FINDINGS: Mild bilateral hip joint osteoarthritis with joint space narrowing and acetabular spurring. Femoral heads are well seated. No fracture, erosion, or evidence of avascular necrosis. Intact pubic rami. No bony destruction or evidence of focal bone lesion. Pubic symphysis and sacroiliac joints are congruent with moderate degenerative change. IMPRESSION: Bilateral hip osteoarthritis.  No acute osseous findings. Electronically Signed   By: Narda Rutherford M.D.   On: 01/12/2022 18:38   ? ?Procedures ?Procedures  ? ? ?Medications Ordered in ED ?Medications - No data to display ? ?ED Course/ Medical Decision Making/ A&P ?  ?                        ?Medical Decision Making ?Amount and/or Complexity of Data Reviewed ?Radiology: ordered. ? ? ?Patient is here with low back pain, suspect this is sciatica or other peripheral neuropathy pain.  Low suspicion for spinal fracture.  X-rays ordered from triage, personally reviewed and interpreted, going chronic degenerative disc disease and osteoporotic bone disease, but no evident fracture.  Do not believe needed  emergent CT or MRI at this time, low suspicion for cauda equina.  I advised the patient's son that if the symptoms continue they would need to follow-up with her doctor for an MRI of the lower back ? ?I have a lower suspicion for AAA clinically. ? ?I also advised that they can give him an extra dose of gabapentin 300 mg at bedtime if he is having difficulty with sleep.   ? ?However, the patient appears extremely comfortable sitting in his chair during my exam, not showing any significant distress at this time.  They can also continue with Tylenol.  They verbalized understanding. ? ? ? ? ? ? ? ?  Final Clinical Impression(s) / ED Diagnoses ?Final diagnoses:  ?Acute left-sided low back pain without sciatica  ? ? ?Rx / DC Orders ?ED Discharge Orders   ? ? None  ? ?  ? ? ?  ?Terald Sleeperrifan, Fahad Cisse J, MD ?01/12/22 1917 ? ?

## 2022-07-07 NOTE — Progress Notes (Deleted)
STROKE NEUROLOGY FOLLOW UP NOTE  NAME: Austin Sawyer DOB: 07-27-1935  REASON FOR VISIT: Seizure follow-up and medication management HISTORY FROM: chart, pt and aide  No chief complaint on file.    HPI:  Austin Sawyer is an 87 year old male with seizures s/p right frontal ICH on 06/24/2016 in setting of hypertension due to noncompliance or CVA given recurrent lobar ICHs.  History of left occipital ICH with SAH, SDH and IVH on 02/08/2015.  First onset G TC 02/21/2017 and as medications were not started by [facility] as requested by wife/patient, another G TC 04/2017 with initiation of gabapentin and Lamictal.  Recurrent episode 06/2017 during titration of Lamictal.  Possible recurrent seizure versus syncopal events on 11/23/2017 and 02/20/2018.  He returned again to ED on 10/02/2019 with seizure-like activity in setting of AKI.  Vascular risk factors include dementia, HTN, HLD, DM, CAD, possible CAA and atrial flutter.  He is routinely followed in office for seizure monitoring per patient request although medication and labs maintained by [facility]    Update 07/08/2022 JM: Patient returns for regularly seizure follow-up.  Overall stable.  Remains on lamotrigine 150 mg twice daily, denies side effects.  Denies any recent seizure activity.       History provided for reference purposes only Update 07/08/2021 JM: Returns for yearly seizure follow-up accompanied by his wife and caregiver, [name].  Overall stable.  On lamotrigine 150 mg twice daily without side effects and no seizure activity.  Lab work recently completed by [place] which was satisfactory.  Cognition relatively stable but can fluctuate - at times behaviors with agitation and aggression - usually occurs when he cannot get what he wants at that exact time. No sleep related concerns. No paranoia or hallucinations. Wife unsure if PCP monitoring   Update 06/12/2020 JM: Austin Sawyer returns for seizure follow-up accompanied by his aide  He has been  doing well since prior visit without any reoccurring seizure activity He has remained on lamotrigine 150mg  twice daily tolerating well without side effects  Cognition has been stable without behavioral concerns per aide.    No concerns at this time   Update 10/24/2019: Austin Sawyer is a 87 year old male who is being seen today for seizure follow-up accompanied by his caregiver.  He was evaluated at [facility] on 10/02/2019 due to concern of seizure activity.  Apparently, his wife witnessed jerking and shaking movements that lasted for approximately 3 hours and then resolved.  He was also found to have AKI with Cre 2.95 and received IVF with improvement of Cre down to 1.68.  It was felt as though seizure activity in setting of AKI. Recommended to continue current AEDs but did make changes to antihypertensives due to AKI and was discharged home.  He has not had any reoccurring seizure events since that time.  He has continued on lamotrigine 150 mg twice daily and gabapentin 300 mg 3 times daily tolerating well without side effects.  Lamotrigine level obtained at prior visit satisfactory. Caregiver does report difficulty obtaining his prescription for amlodipine as she states the 10/04/2019 has not received it and is requesting our office to send in a refill.  Caregiver reports stable cognition with baseline dementia and no concerns regarding behaviors with ongoing use of Seroquel.  He continues to live with his wife but does have daily caregiver assistance.  Continues to ambulate with a cane outdoors but no assistive device indoors and no recent falls.  Continues on atorvastatin 20 mg daily for secondary stroke prevention  without side effects.  Blood pressure today 138/82.  No further concerns at this time.  Update 04/23/2019: Austin Sawyer is a 87 year old male who is being seen today for follow up regarding seizures and is accompanied by his caregiver. He continues on lamotrigine 150mg  BID without recurrent seizure activity.  Prior lamotrigine level satisfactory.  Continues on atorvastatin 20 mg daily for HLD and secondary stroke prevention. Blood pressure elevated today at 179/88, asymptomatic - not routinely monitored at home. He continues to receive caregiver assistance M-F for 8 hours.  Continues to ambulate with cane and denies recent falls.  No further concerns at this time.  History 10/23/2018: Patient is being seen today for seizure follow-up visit and medication management.  He is accompanied by his wife and caregiver.  He continues on lamotrigine 150 mg BID. Reported compliance by wife and caregiver. Wife does report recurrent seizure on 10/05/18 which lasted approx 4-5 minutes with reported shaking of his legs. No further events since prior appt and denies any recurrent seizure activity since 10/05/18.  He continues to be followed by [facility] for medication management.  Wife does report blood work was obtained approximately 2 weeks ago but is unsure if lamotrigine level was obtained.  Wife does state that prior to his recent seizure activity, patient was agitated and very upset and was questioning whether this could have led to his seizure activity.  Wife does endorse worsening of his memory and cognition over the past 6 months.  He does have aide assistance for 8 hours/day and his wife cares for him in the evening and overnight.  She does endorse patient sleeps well during the night but at times during the day, can have behavioral issues with aggression, paranoia and confusion.  Continues on atorvastatin without side effects myalgias.  Blood pressure 151/76. He does use cane for ambulation while outside. He does not need for indoor.  No further concerns at this time.  History summary Austin Sawyer is a 87 y.o. male with history of HTN, DM, CAD, atrial flutter on Coumadin before until 2011, alcohol abuse admitted on 02/08/15 for headache, nausea vomiting, confusion, blurred vision. CT had showed left occipital ICH with small SAH,  SDH and IVH. Put on 3% saline and admitted to ICU. He was then stabilized and off 3% saline. Stroke work up including MRA, CUS, 2D ehco, and EEG were negative. Repeat CT head stable. LDL 107. He was discharged to SNF after medically ready.   06/24/16 admission -right frontal ICH with midline shift in setting of hypertension due to noncompliance or CAA given recurrent ICHs.   02/21/17 admission-initial seizure activity: Pt was sitting in recliner, and had sudden onset eyes rolling back, LOC, shaking in all extremities, mouth foaming, lasted 3-4 min. 911 called, on arrival, pt started to wake up but still not able to communicate. However, in ED, pt was back to baseline. CT head negative for acute changes. Wife denies recent infection. UDA and UA negative. He was discharged from ED and requested follow up urgently in clinic.   05/24/17 follow up -recurrent seizure activity.  Pt has been doing the same. EEG diffuse slow mild, no seizure. However, he did not get lamictal from 05/26/17 since last visit. In 04/2017, he had another GTC at home and sent to [facility] ER. [facility] started him on gabapentin 300mg  tid. He finished the medication and did not get refill from [facility]. [facility] would like [facility] further plan regarding AED use.  08/30/17 follow up [employer]: pt has been doing well except 06/2017 had one episode of mild seizure. EMS called but no need to go to ER. He was still on titrating phase of lamical at that time. Now his lamictal is at 100mg  bid. He was also started gabapentin 300mg  tid for neuropathy by his PCP. No more seizure episode since.   02/28/18 follow up [place]: During the interval time, pt had 2 episodes concerning for seizure. One was 3 months ago, pt was in standing position, wife noticed that pt became glazed eyes and staring off, people helped him sitting down, it lasted about 3-4 min. No shaking or jerking. Another one was last Friday, he was standing in bathroom brushing his teeth, had both hand shaking, eyes  rolling back, mouth smacking, and LOC. Family laid him down and called EMS. Episode lasted 3-4 min, and on EMS arrival his BP 200s/100s. No obvious post ictal after. Today his BP 154/76 but wife said his BP at home 100s/70s. He takes lamictal and gabapentin at home, but wife and caregiver not sure the dosage of lamictal at this time.  Family and caregiver also complain that patient sleeps a lot at home during the day for the last 2-3 months. He has good sleep at night but still sleepy during the day. On gabapentin 300mg  tid and seroquel 25mg  Qhs but seems have been on for a while. Family does not want to change this time.    04/21/18 visit: Since previous visit, patient was recently seen in the ED for seizure activity on 03/30/2018.  Patient was in his usual state of health and was ambulating to the bathroom when his wife found him down with a generalized tonic-clonic movements that lasted approximately 4 minutes.  Lamictal dose was previously increased from 100 mg twice daily and gradually increased to 150 mg twice daily on 02/28/2018.  Per hospital notes, patient states compliance with all seizure medications.  All imaging's were negative for acute abnormality.  UA was negative for infections.  No EKG changes or elevation in tropes.  Patient is being seen today for follow-up visit and is accompanied by his wife and caregiver.  He states since recent hospital admission, he has not had seizure activity.  He was not at full dose of Lamictal when he had the seizure activity that required hospitalization but since last week, he has been taking full dose of Lamictal 150 mg twice daily.  Patient is tolerating this well without side effects.  He does have a caregiver from Monday through Friday from 7 AM to 3 PM.  Overall patient states he feels he is doing well and has no concerns/complaints.      REVIEW OF SYSTEMS: Full 14 system review of systems performed and notable only for those listed in HPI above, all  others are negative   Current Outpatient Medications on File Prior to Visit  Medication Sig Dispense Refill   allopurinol (ZYLOPRIM) 300 MG tablet Take 300 mg by mouth daily. For gout     amLODipine (NORVASC) 10 MG tablet Take 1 tablet (10 mg total) by mouth daily. 90 tablet 0   atorvastatin (LIPITOR) 20 MG tablet Take 1 tablet (20 mg total) by mouth daily at 6 PM.     camphor-menthol (SARNA) lotion Apply 1 application topically 2 (two) times daily.     carvedilol (COREG) 12.5 MG tablet Take 12.5 mg by mouth 2 (two) times daily with a meal. Patient takes 12.5 mg at lunch and 12.5  mg at dinner.     clotrimazole (LOTRIMIN) 1 % cream Apply 1 application topically 2 (two) times daily.     fluocinonide cream (LIDEX) 0.05 % Apply 1 application topically 2 (two) times daily.     gabapentin (NEURONTIN) 300 MG capsule Take 300 mg by mouth 3 (three) times daily.     hydrALAZINE (APRESOLINE) 50 MG tablet Take 75 mg by mouth 3 (three) times daily.      lamoTRIgine (LAMICTAL) 150 MG tablet Take 1 tablet (150 mg total) by mouth 2 (two) times daily. 180 tablet 3   metFORMIN (GLUCOPHAGE) 500 MG tablet Take 500 mg by mouth daily.      nystatin (MYCOSTATIN/NYSTOP) powder Apply 1 application topically 2 (two) times daily.     QUEtiapine (SEROQUEL) 25 MG tablet Take 1 tablet (25 mg total) by mouth at bedtime. 30 tablet 1   tamsulosin (FLOMAX) 0.4 MG CAPS capsule Take 0.4 mg by mouth daily.      zinc oxide 20 % ointment Apply 1 application topically 2 (two) times daily.     No current facility-administered medications on file prior to visit.    Past Medical History:  Diagnosis Date   Alcohol abuse, in remission    Atrial flutter (HCC)    Back pain    Cervicalgia    Coronary artery disease    Coronary atherosclerosis of unspecified type of vessel, native or graft    Dementia (HCC)    Depression    Diabetes mellitus without complication (HCC)    Disturbance of skin sensation    Hypertension    MI, old     Reflux    Seizures (HCC)    Stroke (HCC)    Unspecified hearing loss    Unspecified sleep apnea    Past Surgical History:  Procedure Laterality Date   HERNIA REPAIR     IR GENERIC HISTORICAL  07/16/2016   IR REPLC GASTRO/COLONIC TUBE PERCUT W/FLUORO 07/16/2016 Irish LackGlenn Yamagata, MD [facility]-INTERV RAD   IR GENERIC HISTORICAL  09/07/2016   IR GASTROSTOMY TUBE REMOVAL 09/07/2016 [facility]-INTER[facility] RAD   PEG PLACEMENT N/A 07/02/2016   Procedure: PERCUTANEOUS ENDOSCOPIC GASTROSTOMY (PEG) PLACEMENT;  Surgeon: [name] Thompson, MD;  [facility];  Service: Endoscopy;  Laterality: N/A;   ROTATOR CUFF REPAIR         Physical examination There were no vitals filed for this visit.   There is no height or weight on file to calculate BMI.  General - Well nourished, well developed, very pleasant elderly African-American male, in no apparent distress Ophthalmologic - Fundi not visualized due to noncooperation. Cardiovascular - Regular rate and rhythm.  Neurological exam Mental Status -  Awake and alert, orientated to self but disoriented to place and time.   Language exam showed fluent speech participated in limited conversation with caregiver and wife providing majority of information. Able to follow some simple commands and cooperative with exam.  Cranial Nerves II - XII - II - not cooperative on exam, but able to blink bilaterally to visual threat III, IV, VI - eye movement intact. Left eye INO V - Facial sensation intact bilaterally. VII - Facial movement intact bilaterally. VIII -severe HOH bilaterally X - Palate elevates symmetrically. XI - Chin turning & shoulder shrug intact bilaterally. XII - Tongue protrusion intact.  Motor Strength - The patient's strength was normal in all extremities and pronator drift was absent.  Bulk was normal and fasciculations were absent.   Motor Tone -chronic mild left hemiparesis stable  Reflexes - The patient's reflexes were 1+ in all extremities and  he had no pathological reflexes.  Sensory - Light touch, temperature/pinprick were assessed and were normal.    Coordination - The patient had normal movements in the hands and feet with no ataxia or dysmetria.  Tremor was absent.  Gait and Station - walk with cane, slow, cautions gait but steady.    Pertinent labs:                    Assessment: Austin Sawyer is an 87 year old male with seizures s/p right frontal ICH on 06/24/2016 in setting of hypertension due to noncompliance or CVA given recurrent lobar ICHs.  History of left occipital ICH with SAH, SDH and IVH on 02/08/2015.  First onset G TC 02/21/2017 and as medications were not started by [facility] as requested by wife/patient, another G TC 04/2017 with initiation of gabapentin and Lamictal.  Recurrent episode 06/2017 during titration of Lamictal.  Possible recurrent seizure versus syncopal events on 11/23/2017 and 02/20/2018.  He returned again to ED on 10/02/2019 with seizure-like activity in setting of AKI.  Vascular risk factors include dementia, HTN, HLD, DM, CAD, possible CAA and atrial flutter.       Plan:  -Continue lamotrigine 150 mg twice daily for seizure prophylaxis -[facility] -Lab work completed by [employer] (as above) - all satisfactory -Cognition stable per wife - defer ongoing monitoring and management to PCP who has been managing - Follow up with your [facility] physician for stroke risk factor modification and management.  Recommend maintain blood pressure goal <140/80, diabetes with hemoglobin A1c goal below 6.5% and lipids with LDL cholesterol goal below 70 mg/dL and ongoing use of Crestor.    Per request, follow-up in 1 year or call earlier if needed   I spent 26 minutes of face-to-face and non-face-to-face time with patient, wife and aide.  This included previsit chart review, lab review, study review, order entry, electronic health record documentation, patient and wife in 8 education regarding seizures and  ongoing use of AED, history of prior strokes, cognitive impairment and answered all questions to patient, wife and aides satisfaction   Ihor Austin, [facility]  [facility] [address] [place], [place] [address]  Phone [phone] Fax [phone] Note: This document was prepared with digital dictation and possible smart phrase technology. Any transcriptional errors that result from this process are unintentional.

## 2022-07-08 ENCOUNTER — Ambulatory Visit: Payer: Medicare Other | Admitting: Adult Health

## 2022-07-08 ENCOUNTER — Encounter: Payer: Self-pay | Admitting: Adult Health

## 2024-06-05 ENCOUNTER — Inpatient Hospital Stay (HOSPITAL_BASED_OUTPATIENT_CLINIC_OR_DEPARTMENT_OTHER)
Admission: EM | Admit: 2024-06-05 | Discharge: 2024-06-07 | DRG: 916 | Disposition: A | Discharge status: Home / Self Care | Attending: Internal Medicine | Admitting: Internal Medicine

## 2024-06-05 ENCOUNTER — Other Ambulatory Visit: Payer: Self-pay

## 2024-06-05 DIAGNOSIS — H9190 Unspecified hearing loss, unspecified ear: Secondary | ICD-10-CM | POA: Diagnosis present

## 2024-06-05 DIAGNOSIS — I10 Essential (primary) hypertension: Secondary | ICD-10-CM | POA: Diagnosis present

## 2024-06-05 DIAGNOSIS — M109 Gout, unspecified: Secondary | ICD-10-CM | POA: Diagnosis present

## 2024-06-05 DIAGNOSIS — Z818 Family history of other mental and behavioral disorders: Secondary | ICD-10-CM

## 2024-06-05 DIAGNOSIS — I129 Hypertensive chronic kidney disease with stage 1 through stage 4 chronic kidney disease, or unspecified chronic kidney disease: Secondary | ICD-10-CM | POA: Diagnosis present

## 2024-06-05 DIAGNOSIS — E1159 Type 2 diabetes mellitus with other circulatory complications: Secondary | ICD-10-CM | POA: Diagnosis present

## 2024-06-05 DIAGNOSIS — G309 Alzheimer's disease, unspecified: Secondary | ICD-10-CM | POA: Diagnosis present

## 2024-06-05 DIAGNOSIS — N1832 Chronic kidney disease, stage 3b: Secondary | ICD-10-CM | POA: Diagnosis present

## 2024-06-05 DIAGNOSIS — Y92009 Unspecified place in unspecified non-institutional (private) residence as the place of occurrence of the external cause: Secondary | ICD-10-CM

## 2024-06-05 DIAGNOSIS — Z888 Allergy status to other drugs, medicaments and biological substances status: Secondary | ICD-10-CM

## 2024-06-05 DIAGNOSIS — D696 Thrombocytopenia, unspecified: Secondary | ICD-10-CM | POA: Diagnosis present

## 2024-06-05 DIAGNOSIS — Z7984 Long term (current) use of oral hypoglycemic drugs: Secondary | ICD-10-CM

## 2024-06-05 DIAGNOSIS — F0280 Dementia in other diseases classified elsewhere without behavioral disturbance: Secondary | ICD-10-CM | POA: Diagnosis present

## 2024-06-05 DIAGNOSIS — Z79899 Other long term (current) drug therapy: Secondary | ICD-10-CM

## 2024-06-05 DIAGNOSIS — T464X5A Adverse effect of angiotensin-converting-enzyme inhibitors, initial encounter: Secondary | ICD-10-CM | POA: Diagnosis present

## 2024-06-05 DIAGNOSIS — N183 Chronic kidney disease, stage 3 unspecified: Secondary | ICD-10-CM | POA: Insufficient documentation

## 2024-06-05 DIAGNOSIS — T783XXA Angioneurotic edema, initial encounter: Principal | ICD-10-CM | POA: Diagnosis present

## 2024-06-05 DIAGNOSIS — E785 Hyperlipidemia, unspecified: Secondary | ICD-10-CM | POA: Diagnosis present

## 2024-06-05 DIAGNOSIS — F1011 Alcohol abuse, in remission: Secondary | ICD-10-CM | POA: Diagnosis present

## 2024-06-05 DIAGNOSIS — Z87891 Personal history of nicotine dependence: Secondary | ICD-10-CM

## 2024-06-05 DIAGNOSIS — E1122 Type 2 diabetes mellitus with diabetic chronic kidney disease: Secondary | ICD-10-CM | POA: Diagnosis present

## 2024-06-05 DIAGNOSIS — I252 Old myocardial infarction: Secondary | ICD-10-CM

## 2024-06-05 DIAGNOSIS — I2510 Atherosclerotic heart disease of native coronary artery without angina pectoris: Secondary | ICD-10-CM | POA: Diagnosis present

## 2024-06-05 DIAGNOSIS — R569 Unspecified convulsions: Secondary | ICD-10-CM | POA: Diagnosis present

## 2024-06-05 DIAGNOSIS — Z8673 Personal history of transient ischemic attack (TIA), and cerebral infarction without residual deficits: Secondary | ICD-10-CM

## 2024-06-05 LAB — COMPREHENSIVE METABOLIC PANEL WITH GFR
ALT: 18 U/L (ref 0–44)
AST: 48 U/L — ABNORMAL HIGH (ref 15–41)
Albumin: 4.4 g/dL (ref 3.5–5.0)
Alkaline Phosphatase: 71 U/L (ref 38–126)
Anion gap: 12 (ref 5–15)
BUN: 28 mg/dL — ABNORMAL HIGH (ref 8–23)
CO2: 24 mmol/L (ref 22–32)
Calcium: 9.2 mg/dL (ref 8.9–10.3)
Chloride: 97 mmol/L — ABNORMAL LOW (ref 98–111)
Creatinine, Ser: 2.12 mg/dL — ABNORMAL HIGH (ref 0.61–1.24)
GFR, Estimated: 30 mL/min — ABNORMAL LOW (ref >60)
Glucose, Bld: 87 mg/dL (ref 70–99)
Potassium: 4 mmol/L (ref 3.5–5.1)
Sodium: 133 mmol/L — ABNORMAL LOW (ref 135–145)
Total Bilirubin: 0.6 mg/dL (ref 0.0–1.2)
Total Protein: 7.4 g/dL (ref 6.5–8.1)

## 2024-06-05 LAB — CBC WITH DIFFERENTIAL/PLATELET
Abs Immature Granulocytes: 0.05 K/uL (ref 0.00–0.07)
Basophils Absolute: 0.1 K/uL (ref 0.0–0.1)
Basophils Relative: 1 %
Eosinophils Absolute: 0.2 K/uL (ref 0.0–0.5)
Eosinophils Relative: 2 %
HCT: 39.3 % (ref 39.0–52.0)
Hemoglobin: 13.1 g/dL (ref 13.0–17.0)
Immature Granulocytes: 1 %
Lymphocytes Relative: 14 %
Lymphs Abs: 1.2 K/uL (ref 0.7–4.0)
MCH: 29.9 pg (ref 26.0–34.0)
MCHC: 33.3 g/dL (ref 30.0–36.0)
MCV: 89.7 fL (ref 80.0–100.0)
Monocytes Absolute: 0.8 K/uL (ref 0.1–1.0)
Monocytes Relative: 9 %
Neutro Abs: 6.3 K/uL (ref 1.7–7.7)
Neutrophils Relative %: 73 %
Platelets: 121 K/uL — ABNORMAL LOW (ref 150–400)
RBC: 4.38 MIL/uL (ref 4.22–5.81)
RDW: 14.2 % (ref 11.5–15.5)
WBC: 8.7 K/uL (ref 4.0–10.5)
nRBC: 0 % (ref 0.0–0.2)

## 2024-06-05 MED ORDER — DIPHENHYDRAMINE HCL 50 MG/ML IJ SOLN
50.0000 mg | Freq: Once | INTRAMUSCULAR | Status: AC
  Administered 2024-06-05: 50 mg via INTRAVENOUS
  Filled 2024-06-05: qty 1

## 2024-06-05 MED ORDER — LORAZEPAM 1 MG PO TABS
1.0000 mg | ORAL_TABLET | Freq: Once | ORAL | Status: AC
  Administered 2024-06-06: 1 mg via ORAL
  Filled 2024-06-05: qty 1

## 2024-06-05 MED ORDER — DEXAMETHASONE SODIUM PHOSPHATE 10 MG/ML IJ SOLN
10.0000 mg | Freq: Once | INTRAMUSCULAR | Status: AC
  Administered 2024-06-05: 10 mg via INTRAVENOUS
  Filled 2024-06-05: qty 1

## 2024-06-05 NOTE — ED Triage Notes (Signed)
 Pt with caregiver-   Pt with hx of alzheimers- caregiver reports finding pt asleep in chair this AM, noticed angioedema appx 0830 today.  Caregiver reports swelling has improved since this AM.

## 2024-06-05 NOTE — ED Provider Notes (Signed)
 [place] [facility] Provider Note   CSN: [id number] Arrival date & time: 06/05/24  1428     Patient presents with: Angioedema   Austin Sawyer is a 89 y.o. male.   The history is provided by a caregiver, medical records and the patient. No language interpreter was used.     89 year old male history of dementia, depression, alcohol abuse in remission, atrial flutter, CHF, diabetes accompanied by caregiver for evaluation of lip swelling.  Per caregiver, patient's lip appears to be swollen this morning around 830 when she first saw the patient.  It has not increased in size since she first noticed it.  Patient does not appear to have any trouble breathing.  No report of any new medication changes or environmental changes.  This has never happened before.  Patient is not on any ACE inhibitor.  Prior to Admission medications   Medication Sig Start Date End Date Taking? Authorizing Provider  allopurinol  (ZYLOPRIM ) 300 MG tablet Take 300 mg by mouth daily. For gout    [provider]  amLODipine  (NORVASC ) 10 MG tablet Take 1 tablet (10 mg total) by mouth daily. 10/25/19 07/08/21  Whitfield Raisin, NP  atorvastatin  (LIPITOR) 20 MG tablet Take 1 tablet (20 mg total) by mouth daily at 6 PM. 07/02/16   Noemi Reena CROME, NP  camphor-menthol [facility] lotion Apply 1 application topically 2 (two) times daily.    [provider]  carvedilol  (COREG ) 12.5 MG tablet Take 12.5 mg by mouth 2 (two) times daily with a meal. Patient takes 12.5 mg at lunch and 12.5 mg at dinner.    [provider]  clotrimazole (LOTRIMIN) 1 % cream Apply 1 application topically 2 (two) times daily.    [provider]  fluocinonide cream (LIDEX) 0.05 % Apply 1 application topically 2 (two) times daily.    [provider]  gabapentin  (NEURONTIN ) 300 MG capsule Take 300 mg by mouth 3 (three) times daily.    [provider]  hydrALAZINE  (APRESOLINE ) 50  MG tablet Take 75 mg by mouth 3 (three) times daily.     [provider]  lamoTRIgine  (LAMICTAL ) 150 MG tablet Take 1 tablet (150 mg total) by mouth 2 (two) times daily. 04/23/19   Whitfield Raisin, NP  metFORMIN  (GLUCOPHAGE ) 500 MG tablet Take 500 mg by mouth daily.     [provider]  nystatin (MYCOSTATIN/NYSTOP) powder Apply 1 application topically 2 (two) times daily.    [provider]  QUEtiapine  (SEROQUEL ) 25 MG tablet Take 1 tablet (25 mg total) by mouth at bedtime. 02/21/15   Rinehuls, Alm CROME, PA-C  tamsulosin  (FLOMAX ) 0.4 MG CAPS capsule Take 0.4 mg by mouth daily.     [provider]  zinc oxide 20 % ointment Apply 1 application topically 2 (two) times daily.    [provider]    Allergies: Patient has no known allergies.    Review of Systems  Unable to perform ROS: Dementia    Updated Vital Signs BP (!) 125/53 (BP Location: Right Arm)   Pulse 66   Temp 98.5 F (36.9 C) (Oral)   Resp 18   SpO2 100%   Physical Exam Vitals and nursing note reviewed.  Constitutional:      General: He is not in acute distress.    Appearance: He is well-developed.     Comments: Patient sitting resting comfortably in no acute discomfort.  HENT:     Head: Normocephalic and atraumatic.  Mouth/Throat:     Comments: Angioedema of the upper lip without any tongue involvement mucosal involvement. Eyes:     Conjunctiva/sclera: Conjunctivae normal.  Cardiovascular:     Rate and Rhythm: Normal rate and regular rhythm.     Pulses: Normal pulses.     Heart sounds: Normal heart sounds.  Pulmonary:     Effort: Pulmonary effort is normal.     Breath sounds: Normal breath sounds. No wheezing.  Abdominal:     Palpations: Abdomen is soft.     Tenderness: There is no abdominal tenderness.  Musculoskeletal:     Cervical back: Normal range of motion and neck supple.  Skin:    Findings: No rash.  Neurological:     Mental Status: He is alert. Mental  status is at baseline.     (all labs ordered are listed, but only abnormal results are displayed) Labs Reviewed  CBC WITH DIFFERENTIAL/PLATELET - Abnormal; Notable for the following components:      Result Value   Platelets 121 (*)    All other components within normal limits  COMPREHENSIVE METABOLIC PANEL WITH GFR - Abnormal; Notable for the following components:   Sodium 133 (*)    Chloride 97 (*)    BUN 28 (*)    Creatinine, Ser 2.12 (*)    AST 48 (*)    GFR, Estimated 30 (*)    All other components within normal limits    EKG: None  Radiology: No results found.   Procedures   Medications Ordered in the ED  dexamethasone  (DECADRON ) injection 10 mg (10 mg Intravenous Given 06/05/24 1704)  diphenhydrAMINE  (BENADRYL ) injection 50 mg (50 mg Intravenous Given 06/05/24 1704)                                    Medical Decision Making Amount and/or Complexity of Data Reviewed Labs: ordered.   BP (!) 125/53 (BP Location: Right Arm)   Pulse 66   Temp 98.5 F (36.9 C) (Oral)   Resp 18   SpO2 100%   73:64 PM 89 year old male history of dementia, depression, alcohol abuse in remission, atrial flutter, CHF, diabetes accompanied by caregiver for evaluation of lip swelling.  Per caregiver, patient's lip appears to be swollen this morning around 830 when she first saw the patient.  It has not increased in size since she first noticed it.  Patient does not appear to have any trouble breathing.  No report of any new medication changes or environmental changes.  This has never happened before.  Patient is not on any ACE inhibitor.  On exam patient is resting comfortably appears to be in no acute discomfort.  He does have mild angioedema involving his upper lip but no tongue involvement no mucosal involvement no airway compromise no wheezes and no concerning rash.  -Labs ordered, independently viewed and interpreted by me.  Labs remarkable for Cr 2.12 -The patient was maintained on a  cardiac monitor.  I personally viewed and interpreted the cardiac monitored which showed an underlying rhythm of: NSR -Imaging not considered -This patient presents to the ED for concern of lip swelling, this involves an extensive number of treatment options, and is a complaint that carries with it a high risk of complications and morbidity.  The differential diagnosis includes angioedema, allergic reaction, cellulitis, abscess, contusion -Co morbidities that complicate the patient evaluation includes dementia -Treatment includes benadryl , decadron  -Reevaluation of the  patient after these medicines showed that the patient stayed the same -PCP office notes or outside notes reviewed -Discussion with specialist [facility] DR. Ghimire who agrees to admit pt for observation.  -Escalation to admission/observation considered: patient stable for admission.      Final diagnoses:  Angioedema, initial encounter    ED Discharge Orders     None          Nivia Colon, PA-C 06/05/24 1726    Kammerer, Megan L, DO 06/06/24 1500

## 2024-06-05 NOTE — ED Notes (Signed)
 Bed alarm on patients bed Door open Fall risk sign on door Patient wearing shoes Fall risk armband.

## 2024-06-05 NOTE — ED Notes (Signed)
 Unable to get vitals before pt left with [facility]. Pt was too confused and would not keep still.

## 2024-06-05 NOTE — ED Notes (Signed)
 Called [facility] for transport to [facility] @18 :01.  Spoke with [employer]

## 2024-06-05 NOTE — ED Notes (Signed)
 Tried to call pt's son Ubaldo to inform that [facility] had arrived to take pt to [facility]. No answer, left voicemail.

## 2024-06-06 ENCOUNTER — Encounter (HOSPITAL_COMMUNITY): Payer: Self-pay | Admitting: Internal Medicine

## 2024-06-06 DIAGNOSIS — G309 Alzheimer's disease, unspecified: Secondary | ICD-10-CM | POA: Diagnosis present

## 2024-06-06 DIAGNOSIS — Z818 Family history of other mental and behavioral disorders: Secondary | ICD-10-CM | POA: Diagnosis not present

## 2024-06-06 DIAGNOSIS — F0280 Dementia in other diseases classified elsewhere without behavioral disturbance: Secondary | ICD-10-CM | POA: Diagnosis present

## 2024-06-06 DIAGNOSIS — F1011 Alcohol abuse, in remission: Secondary | ICD-10-CM | POA: Diagnosis present

## 2024-06-06 DIAGNOSIS — T783XXA Angioneurotic edema, initial encounter: Secondary | ICD-10-CM | POA: Diagnosis present

## 2024-06-06 DIAGNOSIS — N1832 Chronic kidney disease, stage 3b: Secondary | ICD-10-CM | POA: Diagnosis present

## 2024-06-06 DIAGNOSIS — E1159 Type 2 diabetes mellitus with other circulatory complications: Secondary | ICD-10-CM | POA: Diagnosis present

## 2024-06-06 DIAGNOSIS — M109 Gout, unspecified: Secondary | ICD-10-CM | POA: Diagnosis present

## 2024-06-06 DIAGNOSIS — I129 Hypertensive chronic kidney disease with stage 1 through stage 4 chronic kidney disease, or unspecified chronic kidney disease: Secondary | ICD-10-CM | POA: Diagnosis present

## 2024-06-06 DIAGNOSIS — Z888 Allergy status to other drugs, medicaments and biological substances status: Secondary | ICD-10-CM | POA: Diagnosis not present

## 2024-06-06 DIAGNOSIS — H9190 Unspecified hearing loss, unspecified ear: Secondary | ICD-10-CM | POA: Diagnosis present

## 2024-06-06 DIAGNOSIS — I252 Old myocardial infarction: Secondary | ICD-10-CM | POA: Diagnosis not present

## 2024-06-06 DIAGNOSIS — Z7984 Long term (current) use of oral hypoglycemic drugs: Secondary | ICD-10-CM | POA: Diagnosis not present

## 2024-06-06 DIAGNOSIS — R569 Unspecified convulsions: Secondary | ICD-10-CM | POA: Diagnosis present

## 2024-06-06 DIAGNOSIS — Z8673 Personal history of transient ischemic attack (TIA), and cerebral infarction without residual deficits: Secondary | ICD-10-CM | POA: Diagnosis not present

## 2024-06-06 DIAGNOSIS — I2510 Atherosclerotic heart disease of native coronary artery without angina pectoris: Secondary | ICD-10-CM | POA: Diagnosis present

## 2024-06-06 DIAGNOSIS — I10 Essential (primary) hypertension: Secondary | ICD-10-CM

## 2024-06-06 DIAGNOSIS — D696 Thrombocytopenia, unspecified: Secondary | ICD-10-CM | POA: Diagnosis present

## 2024-06-06 DIAGNOSIS — N183 Chronic kidney disease, stage 3 unspecified: Secondary | ICD-10-CM | POA: Insufficient documentation

## 2024-06-06 DIAGNOSIS — E1122 Type 2 diabetes mellitus with diabetic chronic kidney disease: Secondary | ICD-10-CM | POA: Diagnosis present

## 2024-06-06 DIAGNOSIS — Y92009 Unspecified place in unspecified non-institutional (private) residence as the place of occurrence of the external cause: Secondary | ICD-10-CM | POA: Diagnosis not present

## 2024-06-06 DIAGNOSIS — T464X5A Adverse effect of angiotensin-converting-enzyme inhibitors, initial encounter: Secondary | ICD-10-CM | POA: Diagnosis present

## 2024-06-06 DIAGNOSIS — Z79899 Other long term (current) drug therapy: Secondary | ICD-10-CM | POA: Diagnosis not present

## 2024-06-06 DIAGNOSIS — Z87891 Personal history of nicotine dependence: Secondary | ICD-10-CM | POA: Diagnosis not present

## 2024-06-06 DIAGNOSIS — E785 Hyperlipidemia, unspecified: Secondary | ICD-10-CM | POA: Diagnosis present

## 2024-06-06 LAB — CBC WITH DIFFERENTIAL/PLATELET
Abs Immature Granulocytes: 0.03 K/uL (ref 0.00–0.07)
Basophils Absolute: 0 K/uL (ref 0.0–0.1)
Basophils Relative: 0 %
Eosinophils Absolute: 0 K/uL (ref 0.0–0.5)
Eosinophils Relative: 0 %
HCT: 35.3 % — ABNORMAL LOW (ref 39.0–52.0)
Hemoglobin: 12 g/dL — ABNORMAL LOW (ref 13.0–17.0)
Immature Granulocytes: 0 %
Lymphocytes Relative: 9 %
Lymphs Abs: 0.7 K/uL (ref 0.7–4.0)
MCH: 29.9 pg (ref 26.0–34.0)
MCHC: 34 g/dL (ref 30.0–36.0)
MCV: 88 fL (ref 80.0–100.0)
Monocytes Absolute: 0.3 K/uL (ref 0.1–1.0)
Monocytes Relative: 4 %
Neutro Abs: 7.2 K/uL (ref 1.7–7.7)
Neutrophils Relative %: 87 %
Platelets: 166 K/uL (ref 150–400)
RBC: 4.01 MIL/uL — ABNORMAL LOW (ref 4.22–5.81)
RDW: 14 % (ref 11.5–15.5)
WBC: 8.3 K/uL (ref 4.0–10.5)
nRBC: 0 % (ref 0.0–0.2)

## 2024-06-06 LAB — GLUCOSE, CAPILLARY
Glucose-Capillary: 103 mg/dL — ABNORMAL HIGH (ref 70–99)
Glucose-Capillary: 145 mg/dL — ABNORMAL HIGH (ref 70–99)
Glucose-Capillary: 108 mg/dL — ABNORMAL HIGH (ref 70–99)
Glucose-Capillary: 113 mg/dL — ABNORMAL HIGH (ref 70–99)

## 2024-06-06 LAB — BASIC METABOLIC PANEL WITH GFR
Anion gap: 8 (ref 5–15)
BUN: 25 mg/dL — ABNORMAL HIGH (ref 8–23)
CO2: 22 mmol/L (ref 22–32)
Calcium: 8.9 mg/dL (ref 8.9–10.3)
Chloride: 103 mmol/L (ref 98–111)
Creatinine, Ser: 1.69 mg/dL — ABNORMAL HIGH (ref 0.61–1.24)
GFR, Estimated: 39 mL/min — ABNORMAL LOW (ref >60)
Glucose, Bld: 128 mg/dL — ABNORMAL HIGH (ref 70–99)
Potassium: 4.6 mmol/L (ref 3.5–5.1)
Sodium: 133 mmol/L — ABNORMAL LOW (ref 135–145)

## 2024-06-06 LAB — HEMOGLOBIN A1C
Hgb A1c MFr Bld: 5.3 % (ref 4.8–5.6)
Mean Plasma Glucose: 105.41 mg/dL

## 2024-06-06 MED ORDER — LORAZEPAM 2 MG/ML IJ SOLN
0.5000 mg | Freq: Three times a day (TID) | INTRAMUSCULAR | Status: DC | PRN
  Administered 2024-06-06: 0.5 mg via INTRAVENOUS
  Filled 2024-06-06: qty 1

## 2024-06-06 MED ORDER — ACETAMINOPHEN 325 MG PO TABS: 650.0000 mg | ORAL_TABLET | Freq: Four times a day (QID) | ORAL | Status: DC | PRN

## 2024-06-06 MED ORDER — HYDRALAZINE HCL 20 MG/ML IJ SOLN: 10.0000 mg | INTRAMUSCULAR | Status: DC | PRN

## 2024-06-06 MED ORDER — QUETIAPINE 12.5 MG HALF TABLET: 25.0000 mg | ORAL_TABLET | Freq: Every day | ORAL | Status: DC

## 2024-06-06 MED ORDER — FAMOTIDINE IN NACL 20-0.9 MG/50ML-% IV SOLN
20.0000 mg | INTRAVENOUS | Status: DC
  Administered 2024-06-07: 20 mg via INTRAVENOUS
  Filled 2024-06-06: qty 50

## 2024-06-06 MED ORDER — ACETAMINOPHEN 650 MG RE SUPP: 650.0000 mg | Freq: Four times a day (QID) | RECTAL | Status: DC | PRN

## 2024-06-06 MED ORDER — QUETIAPINE 12.5 MG HALF TABLET
25.0000 mg | ORAL_TABLET | Freq: Two times a day (BID) | ORAL | Status: DC
  Administered 2024-06-06 – 2024-06-07 (×3): 25 mg via ORAL
  Filled 2024-06-06 (×3): qty 2

## 2024-06-06 MED ORDER — CARVEDILOL 6.25 MG PO TABS
6.2500 mg | ORAL_TABLET | Freq: Two times a day (BID) | ORAL | Status: DC
  Administered 2024-06-07: 6.25 mg via ORAL
  Filled 2024-06-06 (×2): qty 1

## 2024-06-06 MED ORDER — FAMOTIDINE IN NACL 20-0.9 MG/50ML-% IV SOLN
20.0000 mg | Freq: Two times a day (BID) | INTRAVENOUS | Status: DC
  Administered 2024-06-06: 20 mg via INTRAVENOUS
  Filled 2024-06-06 (×2): qty 50

## 2024-06-06 MED ORDER — HEPARIN SODIUM (PORCINE) 5000 UNIT/ML IJ SOLN
5000.0000 [IU] | Freq: Three times a day (TID) | INTRAMUSCULAR | Status: DC
  Administered 2024-06-06 – 2024-06-07 (×3): 5000 [IU] via SUBCUTANEOUS
  Filled 2024-06-06 (×5): qty 1

## 2024-06-06 MED ORDER — METHYLPREDNISOLONE SODIUM SUCC 40 MG IJ SOLR
40.0000 mg | Freq: Two times a day (BID) | INTRAMUSCULAR | Status: DC
  Administered 2024-06-06 – 2024-06-07 (×3): 40 mg via INTRAVENOUS
  Filled 2024-06-06 (×4): qty 1

## 2024-06-06 MED ORDER — INSULIN ASPART 100 UNIT/ML IJ SOLN: 0.0000 [IU] | Freq: Three times a day (TID) | INTRAMUSCULAR | Status: DC

## 2024-06-06 MED ORDER — HYDRALAZINE HCL 50 MG PO TABS
50.0000 mg | ORAL_TABLET | Freq: Three times a day (TID) | ORAL | Status: DC
  Administered 2024-06-06 – 2024-06-07 (×5): 50 mg via ORAL
  Filled 2024-06-06 (×5): qty 1

## 2024-06-06 MED ORDER — LAMOTRIGINE 25 MG PO TABS
150.0000 mg | ORAL_TABLET | Freq: Two times a day (BID) | ORAL | Status: DC
  Administered 2024-06-06 – 2024-06-07 (×3): 150 mg via ORAL
  Filled 2024-06-06 (×3): qty 2

## 2024-06-06 NOTE — Plan of Care (Signed)

## 2024-06-06 NOTE — Progress Notes (Signed)
 Ok to reduce dose of famotidine  to 20mg  qday due to renal function per Dr. Bufford Sergio Batch, PharmD, BCIDP, AAHIVP, CPP Infectious Disease Pharmacist 06/06/2024 3:23 PM

## 2024-06-06 NOTE — Progress Notes (Signed)
 Mobility Specialist Progress Note:    06/06/24 1113  Mobility  Activity Ambulated with assistance (In hallway)  Level of Assistance Contact guard assist, steadying assist  Assistive Device Cane  Distance Ambulated (ft) 140 ft  Activity Response Tolerated well  Mobility Referral Yes  Mobility visit 1 Mobility  Mobility Specialist Start Time (ACUTE ONLY) 1053  Mobility Specialist Stop Time (ACUTE ONLY) 1109  Mobility Specialist Time Calculation (min) (ACUTE ONLY) 16 min   Received pt EOB and agreeable to mobility. No physical assistance needed. No c/o. Returned to room without fault. Left pt EOB with alarm on. Personal belongings and call light within reach. All needs met.  Lavanda Pollack Mobility Specialist  Please contact via [employer] or  Rehab Office [phone]

## 2024-06-06 NOTE — Hospital Course (Addendum)
 Austin Sawyer is a 89 y.o. year old male with PMH of  seizures on Lamictal , prior history of CVA and intracranial hemorrhage, Alzheimer's dementia, diabetes mellitus was brought to the ER after patient started having swelling of the lips since 9/2 morning.  Has not had any difficulty breathing or any skin rash or itching.  Per patient's son whom I spoke with there was no new changes in his medications.  Patient did have some abnormal food but patient's son was not able to exactly state which type of food. In ED -found to have swelling of both upper and lower lips w/o respiratory compromise, was given Decadron  10 mg IV and Pepcid  and admitted for further observation.  Subjective: Seen and examined today Worked w/ PTOT and doing well Overnight BP has been fluctuating but afebrile Remains pleasantly confused with his dementia. He is mentating well with his baseline dementia  Discharge diagnosis:  Angioedema: Likely from patient's lisinopril , no other obvious etiology, list of allergic markers allergy at this time.  Treated with IV steroid, his swelling is improved advanced diet and tolerating well, will do short course of steroid upon discharge Allopurinol  listed as historical meds probably not taking it and removed  Patient also had some kind of abnormal food as per the patient's son.  Plan of care-with the patient in agreement to discharge for home today  History of seizures: Continue home AED- admitting discussed with neurologist Dr. Voncile who feels like Lamictal  may not be causing angioedema.  Hypertension: Blood pressure has been fluctuating continue home Coreg  hydralazine .  Lisinopril  discontinued.    T2DM: Stable. no recent hemoglobin A1c.cotn ssi  CKD IIIb: Creatinine around baseline. Recent Labs    06/05/24 1533 06/06/24 0520  BUN 28* 25*  CREATININE 2.12* 1.69*  CO2 24 22  K 4.0 4.6    Thrombocytopenia: Lower side on admission, currently resolved   History of stroke and  intracranial hemorrhage: resume home meds  History of Alzheimer's dementia. Continue delirium precautions  Mobility: PT Orders:  PT Follow up Rec: No Pt Follow Up9/01/2024 1000    DVT prophylaxis: heparin  injection 5,000 Units Start: 06/06/24 0600 Code Status:   Code Status: Full Code Family Communication: plan of care discussed with patient at bedside. Patient status is: Remains hospitalized because of severity of illness Level of care: Progressive   Dispo: The patient is from: home            Anticipated disposition: home today Objective: Vitals last 24 hrs: Vitals:   06/06/24 1549 06/06/24 2200 06/06/24 2202 06/07/24 0631  BP: (!) 144/66 (!) 126/92 (!) 126/92 (!) 184/90  Pulse: (!) 55  68 67  Resp: 16   18  Temp: 97.9 F (36.6 C)  97.7 F (36.5 C) 98.2 F (36.8 C)  TempSrc: Oral  Tympanic Oral  SpO2: 99%  100% 96%  Weight:      Height:        Physical Examination: General exam: AAOX2 HEENT:Oral mucosa moist, Ear/Nose WNL grossly Respiratory system: Bilaterally clear BS,no use of accessory muscle Cardiovascular system: S1 & S2 +, No JVD. Gastrointestinal system: Abdomen soft,NT,ND, BS+ Nervous System: Alert, awake, moving all extremities,and following commands. Extremities: LE edema neg, distal extremities warm.  Skin: No rashes,no icterus. MSK: Normal muscle bulk,tone, power   Medications reviewed:  Scheduled Meds:  carvedilol   6.25 mg Oral BID WC   heparin   5,000 Units Subcutaneous Q8H   hydrALAZINE   50 mg Oral Q8H   insulin  aspart  0-6 Units  Subcutaneous TID WC   lamoTRIgine   150 mg Oral BID   methylPREDNISolone  (SOLU-MEDROL ) injection  40 mg Intravenous Q12H   QUEtiapine   25 mg Oral BID   Continuous Infusions:  famotidine  (PEPCID ) IV 20 mg (06/07/24 1040)   Diet: Diet Order             Diet general           DIET SOFT Room service appropriate? Yes; Fluid consistency: Thin  Diet effective now

## 2024-06-06 NOTE — Progress Notes (Signed)
 Patient seen and examined personally, I reviewed the chart, history and physical and admission note, done by admitting physician this morning and agree with the same with following addendum.  Please refer to the morning admission note for more detailed plan of care.  Briefly,  Austin Sawyer is a 89 y.o. year old male with PMH of  seizures on Lamictal , prior history of CVA and intracranial hemorrhage, Alzheimer's dementia, diabetes mellitus was brought to the ER after patient started having swelling of the lips since 9/2 morning.  Has not had any difficulty breathing or any skin rash or itching.  Per patient's son whom I spoke with there was no new changes in his medications.  Patient did have some abnormal food but patient's son was not able to exactly state which type of food. In ED -found to have swelling of both upper and lower lips w/o respiratory compromise, was given Decadron  10 mg IV and Pepcid  and admitted for further observation.  Subjective: Seen and examined today Alert awake pleasantly confused no complaints BP on higher side 170s, on room air, afebrile Labs reviewed creatinine down to 1.6 from 2.1  Assessment and plan:  Angioedema: Unclear etiology.checking patient's meds list-is on lisinopril  likely the etiology will discontinue the lisinopril . His swelling is significantly improved advance diet  Also on allopurinol  will hold off for now  Patient also had some kind of abnormal food as per the patient's son.  Responded well to Decadron  and Pepcid , continue on Solu-Medrol    History of seizures: Continue home AED- admitting discussed with neurologist Dr. Voncile who feels like Lamictal  may not be causing angioedema.  Hypertension: Borderline controlled resume Coreg , hydralazine , discontinued lisinopril  cont pnr iv meds  T2DM: no recent hemoglobin A1c.CONT SSI  CKD IIIb: Creatinine around baseline.  Thrombocytopenia: Lower side on admission, currently resolved   History of  stroke and intracranial hemorrhage: resume home meds  History of Alzheimer's dementia. Continue delirium precautions  Mobility: PT Orders:  PT Follow up Rec:    DVT prophylaxis: heparin  injection 5,000 Units Start: 06/06/24 0600 Code Status:   Code Status: Full Code Family Communication: plan of care discussed with patient at bedside. Patient status is: Remains hospitalized because of severity of illness Level of care: Progressive   Dispo: The patient is from: home            Anticipated disposition: TBD Objective: Vitals last 24 hrs: Vitals:   06/05/24 1900 06/05/24 2132 06/06/24 0623 06/06/24 0742  BP: (!) 135/108 (!) 175/95 (!) 170/95 (!) 172/89  Pulse:  61 (!) 55 (!) 57  Resp:    16  Temp:  97.8 F (36.6 C) 98.1 F (36.7 C) 97.8 F (36.6 C)  TempSrc:  Oral Oral Oral  SpO2:  100% 100% 100%    Physical Examination: General exam: alert awake, oriented, older than stated age HEENT:Oral mucosa moist, Ear/Nose WNL grossly Respiratory system: Bilaterally clear BS,no use of accessory muscle Cardiovascular system: S1 & S2 +, No JVD. Gastrointestinal system: Abdomen soft,NT,ND, BS+ Nervous System: Alert, awake, moving all extremities,and following commands. Extremities: LE edema neg, distal extremities warm.  Skin: No rashes,no icterus. MSK: Normal muscle bulk,tone, power   Medications reviewed:  Scheduled Meds:  carvedilol   6.25 mg Oral BID WC   heparin   5,000 Units Subcutaneous Q8H   hydrALAZINE   50 mg Oral Q8H   insulin  aspart  0-6 Units Subcutaneous TID WC   lamoTRIgine   150 mg Oral BID   LORazepam   1 mg Oral Once  methylPREDNISolone  (SOLU-MEDROL ) injection  40 mg Intravenous Q12H   QUEtiapine   25 mg Oral QHS   Continuous Infusions:  famotidine  (PEPCID ) IV 20 mg (06/06/24 0853)   Diet: Diet Order             Diet full liquid Room service appropriate? Yes; Fluid consistency: Thin  Diet effective now

## 2024-06-06 NOTE — Plan of Care (Signed)
 Patient has been agitated throughout the shift, has been wandering in patient's room and the hallway. Gave PRN ativan  x 2 (oral and IV). No complaints of pain. Sitter has been ordered. Bed alarm on, call bell within reached. Son was at bedside this afternoon and was aware of patient's behavior, MD is aware too.  Problem: Education: Goal: Knowledge of General Education information will improve Description: Including pain rating scale, medication(s)/side effects and non-pharmacologic comfort measures Outcome: Progressing   Problem: Health Behavior/Discharge Planning: Goal: Ability to manage health-related needs will improve Outcome: Progressing   Problem: Clinical Measurements: Goal: Ability to maintain clinical measurements within normal limits will improve Outcome: Progressing Goal: Will remain free from infection Outcome: Progressing Goal: Diagnostic test results will improve Outcome: Progressing Goal: Respiratory complications will improve Outcome: Progressing Goal: Cardiovascular complication will be avoided Outcome: Progressing   Problem: Activity: Goal: Risk for activity intolerance will decrease Outcome: Progressing   Problem: Nutrition: Goal: Adequate nutrition will be maintained Outcome: Progressing   Problem: Coping: Goal: Level of anxiety will decrease Outcome: Progressing   Problem: Elimination: Goal: Will not experience complications related to bowel motility Outcome: Progressing Goal: Will not experience complications related to urinary retention Outcome: Progressing   Problem: Pain Managment: Goal: General experience of comfort will improve and/or be controlled Outcome: Progressing   Problem: Safety: Goal: Ability to remain free from injury will improve Outcome: Progressing   Problem: Skin Integrity: Goal: Risk for impaired skin integrity will decrease Outcome: Progressing   Problem: Education: Goal: Ability to describe self-care measures that may  prevent or decrease complications (Diabetes Survival Skills Education) will improve Outcome: Progressing Goal: Individualized Educational Video(s) Outcome: Progressing   Problem: Coping: Goal: Ability to adjust to condition or change in health will improve Outcome: Progressing   Problem: Fluid Volume: Goal: Ability to maintain a balanced intake and output will improve Outcome: Progressing   Problem: Health Behavior/Discharge Planning: Goal: Ability to identify and utilize available resources and services will improve Outcome: Progressing Goal: Ability to manage health-related needs will improve Outcome: Progressing   Problem: Metabolic: Goal: Ability to maintain appropriate glucose levels will improve Outcome: Progressing   Problem: Nutritional: Goal: Maintenance of adequate nutrition will improve Outcome: Progressing Goal: Progress toward achieving an optimal weight will improve Outcome: Progressing   Problem: Skin Integrity: Goal: Risk for impaired skin integrity will decrease Outcome: Progressing   Problem: Tissue Perfusion: Goal: Adequacy of tissue perfusion will improve Outcome: Progressing

## 2024-06-06 NOTE — H&P (Addendum)
 History and Physical    Austin Sawyer FMW:[id number] DOB: [birth year] DOA: 06/05/2024  Patient coming from: Home.  Chief Complaint: Lip swelling.  HPI: Austin Sawyer is a 89 y.o. male with history of seizures on Lamictal , prior history of CVA and intracranial hemorrhage, Alzheimer's dementia, diabetes mellitus was brought to the ER after patient started having swelling of the lips since yesterday morning.  Has not had any difficulty breathing or any skin rash or itching.  Per patient's son whom I spoke with there was no new changes in his medications.  Patient did have some abnormal food but patient's son was not able to exactly state which type of food.  ED Course: In the ER patient was noticed to have swelling of both upper and lower lips.  There was no respiratory compromise.  Was given Decadron  10 mg IV and Pepcid  and admitted for further observation.  Patient's labs show a creatinine of 2.1 sodium 133 platelets platelets 121 at the time of my exam is not in distress.  His lip swelling is significantly reduced on discussing with patient's nurse.    Review of Systems: As per HPI, rest all negative.   Past Medical History:  Diagnosis Date   Alcohol abuse, in remission    Atrial flutter (HCC)    Back pain    Cervicalgia    Coronary artery disease    Coronary atherosclerosis of unspecified type of vessel, native or graft    Dementia (HCC)    Depression    Diabetes mellitus without complication (HCC)    Disturbance of skin sensation    Hypertension    MI, old    Reflux    Seizures (HCC)    Stroke (HCC)    Unspecified hearing loss    Unspecified sleep apnea     Past Surgical History:  Procedure Laterality Date   HERNIA REPAIR     IR GENERIC HISTORICAL  07/16/2016   IR REPLC GASTRO/COLONIC TUBE PERCUT W/FLUORO 07/16/2016 Marcey Moan, MD [facility]   IR GENERIC HISTORICAL  09/07/2016   IR GASTROSTOMY TUBE REMOVAL 09/07/2016 [facility]   PEG PLACEMENT N/A 07/02/2016   Procedure:  PERCUTANEOUS ENDOSCOPIC GASTROSTOMY (PEG) PLACEMENT;  Surgeon: Dann Hummer, MD;  Location: [facility] ENDOSCOPY;  Service: Endoscopy;  Laterality: N/A;   ROTATOR CUFF REPAIR       reports that he has quit smoking. He has never used smokeless tobacco. He reports that he does not drink alcohol and does not use drugs.  No Known Allergies  Family History  Problem Relation Age of Onset   Anxiety disorder Mother    Cancer Mother     Prior to Admission medications   Medication Sig Start Date End Date Taking? Authorizing Provider  allopurinol  (ZYLOPRIM ) 300 MG tablet Take 300 mg by mouth daily. For gout    [provider]  amLODipine  (NORVASC ) 10 MG tablet Take 1 tablet (10 mg total) by mouth daily. 10/25/19 07/08/21  Whitfield Raisin, NP  atorvastatin  (LIPITOR) 20 MG tablet Take 1 tablet (20 mg total) by mouth daily at 6 PM. 07/02/16   Noemi Reena CROME, NP  camphor-menthol [facility]) lotion Apply 1 application topically 2 (two) times daily.    [provider]  carvedilol  (COREG ) 12.5 MG tablet Take 12.5 mg by mouth 2 (two) times daily with a meal. Patient takes 12.5 mg at lunch and 12.5 mg at dinner.    [provider]  clotrimazole (LOTRIMIN) 1 % cream Apply 1 application topically 2 (two) times  daily.    [provider]  fluocinonide cream (LIDEX) 0.05 % Apply 1 application topically 2 (two) times daily.    [provider]  gabapentin  (NEURONTIN ) 300 MG capsule Take 300 mg by mouth 3 (three) times daily.    [provider]  hydrALAZINE  (APRESOLINE ) 50 MG tablet Take 75 mg by mouth 3 (three) times daily.     [provider]  lamoTRIgine  (LAMICTAL ) 150 MG tablet Take 1 tablet (150 mg total) by mouth 2 (two) times daily. 04/23/19   Whitfield Raisin, NP  metFORMIN  (GLUCOPHAGE ) 500 MG tablet Take 500 mg by mouth daily.     [provider]  nystatin (MYCOSTATIN/NYSTOP) powder Apply 1 application topically 2 (two) times daily.    [provider]  QUEtiapine  (SEROQUEL ) 25 MG tablet Take 1 tablet (25 mg total) by mouth at bedtime. 02/21/15   Rinehuls, David L, PA-C  tamsulosin  (FLOMAX ) 0.4 MG CAPS capsule Take 0.4 mg by mouth daily.     [provider]  zinc oxide 20 % ointment Apply 1 application topically 2 (two) times daily.    [provider]    Physical Exam: Constitutional: Moderately built and nourished. Vitals:   06/05/24 1830 06/05/24 1845 06/05/24 1900 06/05/24 2132  BP: (!) 161/92 (!) 176/93 (!) 135/108 (!) 175/95  Pulse:    61  Resp:      Temp:    97.8 F (36.6 C)  TempSrc:    Oral  SpO2:    100%   Eyes: Anicteric no pallor. ENMT: No discharge from the ears eyes nose or mouth.  Lip swelling has decreased considerably after discussing with patient's nurse. Neck: No mass felt.  No neck rigidity.  No stridor. Respiratory: No rhonchi or crepitations. Cardiovascular: S1-S2 heard. Abdomen: Soft nontender bowel sound present. Musculoskeletal: No edema. Skin: No rash. Neurologic: Alert awake oriented to name only.  Moving all extremities. Psychiatric: Oriented to his name only.   Labs on Admission: I have personally reviewed following labs and imaging studies  CBC: Recent Labs  Lab 06/05/24 1533  WBC 8.7  NEUTROABS 6.3  HGB 13.1  HCT 39.3  MCV 89.7  PLT 121*   Basic Metabolic Panel: Recent Labs  Lab 06/05/24 1533  NA 133*  K 4.0  CL 97*  CO2 24  GLUCOSE 87  BUN 28*  CREATININE 2.12*  CALCIUM  9.2   GFR: CrCl cannot be calculated (Unknown ideal weight.). Liver Function Tests: Recent Labs  Lab 06/05/24 1533  AST 48*  ALT 18  ALKPHOS 71  BILITOT 0.6  PROT 7.4  ALBUMIN 4.4   No results for input(s): LIPASE, AMYLASE in the last 168 hours. No results for input(s): AMMONIA in the last 168 hours. Coagulation Profile: No results for input(s): INR, PROTIME in the last 168 hours. Cardiac Enzymes: No results for input(s): CKTOTAL, CKMB, CKMBINDEX,  TROPONINI in the last 168 hours. BNP (last 3 results) No results for input(s): PROBNP in the last 8760 hours. HbA1C: No results for input(s): HGBA1C in the last 72 hours. CBG: No results for input(s): GLUCAP in the last 168 hours. Lipid Profile: No results for input(s): CHOL, HDL, LDLCALC, TRIG, CHOLHDL, LDLDIRECT in the last 72 hours. Thyroid  Function Tests: No results for input(s): TSH, T4TOTAL, FREET4, T3FREE, THYROIDAB in the last 72 hours. Anemia Panel: No results for input(s): VITAMINB12, FOLATE, FERRITIN, TIBC, IRON, RETICCTPCT in the last 72 hours. Urine analysis:    Component Value Date/Time   COLORURINE YELLOW 10/02/2019 1251  APPEARANCEUR CLEAR 10/02/2019 1251   LABSPEC 1.010 10/02/2019 1251   PHURINE 5.5 10/02/2019 1251   GLUCOSEU NEGATIVE 10/02/2019 1251   HGBUR NEGATIVE 10/02/2019 1251   BILIRUBINUR NEGATIVE 10/02/2019 1251   KETONESUR NEGATIVE 10/02/2019 1251   PROTEINUR NEGATIVE 10/02/2019 1251   UROBILINOGEN 0.2 06/03/2015 1240   NITRITE NEGATIVE 10/02/2019 1251   LEUKOCYTESUR NEGATIVE 10/02/2019 1251   Sepsis Labs: @LABRCNTIP (procalcitonin:4,lacticidven:4) )No results found for this or any previous visit (from the past 240 hours).   Radiological Exams on Admission: No results found.    Assessment/Plan Principal Problem:   Angioedema Active Problems:   Gout   Essential hypertension   HLD (hyperlipidemia)   Type 2 diabetes mellitus with other circulatory complications (HCC)   Seizures (HCC)   CKD (chronic kidney disease) stage 3, GFR 30-59 ml/min (HCC)    Angioedema cause not clear.  Will need to check patient's medication list which patient's son is bringing.  To my knowledge patient is not taking any ACE inhibitors and has not been on any antibiotics recently.  Patient's list does show allopurinol  not sure if he is still taking it but need to confirm with patient's son.  Patient also had some kind of  abnormal food as per the patient's son.  Will need to get further history when patient's son is available.  Patient did respond well to Decadron  and Pepcid .  Will continue with Solu-Medrol  today. History of seizures per patient's son patient still takes the same antiseizure medication which patient was on.  Reviewing patient's chart patient is on Lamictal .  I did discuss with neurologist Dr. Deedra who feels like Lamictal  may not be causing angioedema. Hypertension for now keeping patient on p.o. scheduled and as needed IV hydralazine  patient will think he is already taking until we confirm home medications. Diabetes mellitus type 2 no recent hemoglobin A1c.  Presently on sliding scale coverage.  Closely monitor. Chronic kidney disease stage III.  Creatinine around baseline. Thrombocytopenia appears to be new.  Follow CBC. History of stroke and intracranial hemorrhage.  Will review home medication once once patient's son brings a list. History of Alzheimer's dementia.   DVT prophylaxis: SCDs. Code Status: Full code. Family Communication: Patient's son. Disposition Plan: Monitored bed. Consults called: None.  Discussed with neurologist. Admission status: Observation.

## 2024-06-07 ENCOUNTER — Other Ambulatory Visit (HOSPITAL_COMMUNITY): Payer: Self-pay

## 2024-06-07 DIAGNOSIS — T783XXA Angioneurotic edema, initial encounter: Secondary | ICD-10-CM | POA: Diagnosis not present

## 2024-06-07 LAB — BASIC METABOLIC PANEL WITH GFR
Anion gap: 11 (ref 5–15)
BUN: 22 mg/dL (ref 8–23)
CO2: 25 mmol/L (ref 22–32)
Calcium: 9.2 mg/dL (ref 8.9–10.3)
Chloride: 104 mmol/L (ref 98–111)
Creatinine, Ser: 1.69 mg/dL — ABNORMAL HIGH (ref 0.61–1.24)
GFR, Estimated: 39 mL/min — ABNORMAL LOW (ref >60)
Glucose, Bld: 109 mg/dL — ABNORMAL HIGH (ref 70–99)
Potassium: 4.4 mmol/L (ref 3.5–5.1)
Sodium: 140 mmol/L (ref 135–145)

## 2024-06-07 LAB — GLUCOSE, CAPILLARY
Glucose-Capillary: 101 mg/dL — ABNORMAL HIGH (ref 70–99)
Glucose-Capillary: 150 mg/dL — ABNORMAL HIGH (ref 70–99)

## 2024-06-07 MED ORDER — PREDNISONE 10 MG PO TABS
ORAL_TABLET | ORAL | 0 refills | Status: AC
  Filled 2024-06-07: qty 20, 8d supply, fill #0

## 2024-06-07 MED ORDER — FAMOTIDINE 20 MG PO TABS
20.0000 mg | ORAL_TABLET | Freq: Two times a day (BID) | ORAL | 0 refills | Status: AC
  Filled 2024-06-07: qty 10, 5d supply, fill #0

## 2024-06-07 NOTE — Evaluation (Signed)
 Occupational Therapy Evaluation Patient Details Name: Austin Sawyer MRN: [id number] DOB: 01/25/1935 Today's Date: 06/07/2024   History of Present Illness   Austin Sawyer is a 89 y.o. male admitted 06/05/24 for angioedema of bilateral lips with unclear etiology. PMHx: seizures, CVA, intracranial hemorrhage, HTN, CKD III, DM, and Alzheimer's dementia.     Clinical Impressions Camila was evaluated s/p the above admission list. He has 24/7 assist, ambulates with a SPC and has assist as needed for ADLs at baseline. Upon evaluation the pt was limited by baseline cognition impairment, agitation, generalized weakness and unsteady balance. Overall he needed generalized supervision A for mobility and min A for ADLs. Cues for initiation, sequencing and re-direction needed throughout. Pt will benefit from continued acute OT services and discharge home with continued support from family.       If plan is discharge home, recommend the following:   A little help with walking and/or transfers;A little help with bathing/dressing/bathroom;Assistance with cooking/housework;Assist for transportation;Help with stairs or ramp for entrance     Functional Status Assessment   Patient has had a recent decline in their functional status and demonstrates the ability to make significant improvements in function in a reasonable and predictable amount of time.     Equipment Recommendations   None recommended by OT      Precautions/Restrictions   Precautions Precautions: Fall Recall of Precautions/Restrictions: Impaired Restrictions Weight Bearing Restrictions Per Provider Order: No     Mobility Bed Mobility Overal bed mobility: Needs Assistance Bed Mobility: Supine to Sit, Sit to Supine     Supine to sit: Supervision, HOB elevated Sit to supine: Supervision, HOB elevated        Transfers Overall transfer level: Needs assistance Equipment used: Straight cane Transfers: Sit to/from Stand Sit to  Stand: Supervision                  Balance Overall balance assessment: Mild deficits observed, not formally tested               ADL either performed or assessed with clinical judgement   ADL Overall ADL's : Needs assistance/impaired Eating/Feeding: Set up   Grooming: Set up   Upper Body Bathing: Set up   Lower Body Bathing: Set up;Sit to/from stand;Supervison/ safety   Upper Body Dressing : Set up;Sitting   Lower Body Dressing: Minimal assistance;Sit to/from stand   Toilet Transfer: Supervision/safety;Ambulation   Toileting- Clothing Manipulation and Hygiene: Supervision/safety       Functional mobility during ADLs: Supervision/safety General ADL Comments: pt is likely at or very close to his baseline, assist for baseline cognitive deficit with increased confusion being in an unfamiliar environment     Vision Baseline Vision/History: 0 No visual deficits Vision Assessment?: No apparent visual deficits     Perception Perception: Not tested       Praxis Praxis: Not tested       Pertinent Vitals/Pain Pain Assessment Pain Assessment: No/denies pain     Extremity/Trunk Assessment Upper Extremity Assessment Upper Extremity Assessment: Overall WFL for tasks assessed   Lower Extremity Assessment Lower Extremity Assessment: Defer to PT evaluation   Cervical / Trunk Assessment Cervical / Trunk Assessment: Normal   Communication Communication Communication: Impaired Factors Affecting Communication: Hearing impaired   Cognition Arousal: Alert Behavior During Therapy: WFL for tasks assessed/performed Cognition: History of cognitive impairments             OT - Cognition Comments: oriented to self only, pt's son states he typically is able  to recognize family                 Following commands: Impaired Following commands impaired: Follows one step commands with increased time, Follows multi-step commands inconsistently, Follows  multi-step commands with increased time     Cueing  General Comments   Cueing Techniques: Verbal cues;Gestural cues  VSS, pt slightly agitatied but re-directable    Home Living Family/patient expects to be discharged to:: Private residence Living Arrangements: Spouse/significant other;Children Available Help at Discharge: Family;Personal care attendant;Available 24 hours/day Type of Home: House Home Access: Level entry     Home Layout: One level     Bathroom Shower/Tub: Chief Strategy Officer: Handicapped height     Home Equipment: Agricultural consultant (2 wheels);Cane - single point;BSC/3in1;Shower seat;Wheelchair - manual   Additional Comments: PCA 7 days/wk, son there to assist in evenings      Prior Functioning/Environment Prior Level of Function : Patient poor historian/Family not available;Independent/Modified Independent;Needs assist             Mobility Comments: walks with SPC intermittently, pt goes on daily walks with his son adn cmolpetes daily stretches. No known falls ADLs Comments: relatively mod I, pt's son or the PCA assists with bathing as needed. Family completes all IADLs    OT Problem List: Decreased strength;Decreased activity tolerance;Decreased range of motion;Impaired balance (sitting and/or standing);Decreased safety awareness;Decreased knowledge of use of DME or AE;Decreased knowledge of precautions   OT Treatment/Interventions: Self-care/ADL training;Therapeutic exercise;DME and/or AE instruction      OT Goals(Current goals can be found in the care plan section)   Acute Rehab OT Goals Patient Stated Goal: to eat OT Goal Formulation: With patient Time For Goal Achievement: 06/21/24 Potential to Achieve Goals: Good ADL Goals Additional ADL Goal #1: Pt will complete all ADLs with supervision A   OT Frequency:  Min 1X/week       AM-PAC OT 6 Clicks Daily Activity     Outcome Measure Help from another person eating meals?: A  Little Help from another person taking care of personal grooming?: A Little Help from another person toileting, which includes using toliet, bedpan, or urinal?: A Little Help from another person bathing (including washing, rinsing, drying)?: A Little Help from another person to put on and taking off regular upper body clothing?: A Little Help from another person to put on and taking off regular lower body clothing?: A Little 6 Click Score: 18   End of Session Nurse Communication: Mobility status  Activity Tolerance: Patient tolerated treatment well Patient left: in bed;with call bell/phone within reach;with bed alarm set  OT Visit Diagnosis: Unsteadiness on feet (R26.81);Other abnormalities of gait and mobility (R26.89);Muscle weakness (generalized) (M62.81)                Time: 8760-8741 OT Time Calculation (min): 19 min Charges:  OT General Charges $OT Visit: 1 Visit OT Evaluation $OT Eval Moderate Complexity: 1 Mod  Lucie Kendall, OTR/L Acute Rehabilitation Services Office [phone] Secure Chat Communication Preferred   Lucie JONETTA Kendall 06/07/2024, 1:43 PM

## 2024-06-07 NOTE — Discharge Summary (Signed)
 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 Yes; Fluid consistency: Thin  Diet effective now                    Brief/Interim Summary: Austin Sawyer is a 89 y.o. year old male with PMH of  seizures on Lamictal , prior history of CVA and intracranial hemorrhage, Alzheimer's dementia, diabetes mellitus was brought to the ER after patient started having swelling of the lips since 9/2 morning.  Has not had any difficulty breathing or any skin rash or itching.  Per patient's son whom I spoke with there was no new changes in his medications.  Patient did have some abnormal food but patient's son was not able to exactly state which type of food. In ED -found to have swelling of both upper and lower lips w/o respiratory compromise, was given Decadron  10 mg IV and Pepcid  and admitted for further observation.  Subjective: Seen and examined today Worked w/ PTOT and doing well Overnight BP has been fluctuating but afebrile Remains pleasantly confused with his dementia. He is mentating well with his baseline dementia  Discharge diagnosis:  Angioedema: Likely from patient's lisinopril , no other obvious etiology, list of allergic markers allergy at this time.  Treated with IV steroid, his swelling is improved advanced diet and tolerating well, will do short course of steroid upon discharge Allopurinol  listed as historical meds probably not taking it and removed  Patient also had some kind of abnormal food as per the patient's son.  Plan of care-with the  patient in agreement to discharge for home today  History of seizures: Continue home AED- admitting discussed with neurologist Dr. Voncile who feels like Lamictal  may not be causing angioedema.  Hypertension: Blood pressure has been fluctuating continue home Coreg  hydralazine .  Lisinopril  discontinued.    T2DM: Stable. no recent hemoglobin A1c.cotn ssi  CKD IIIb: Creatinine around baseline. Recent Labs    06/05/24 1533 06/06/24 0520  BUN 28* 25*  CREATININE 2.12* 1.69*  CO2 24 22  K 4.0 4.6    Thrombocytopenia: Lower side on admission, currently resolved   History of stroke and intracranial hemorrhage: resume home meds  History of Alzheimer's dementia. Continue delirium precautions  Mobility: PT Orders:  PT Follow up Rec: No Pt Follow Up9/01/2024 1000    DVT prophylaxis: heparin  injection 5,000 Units Start: 06/06/24 0600 Code Status:   Code Status: Full Code Family Communication: plan of care discussed with patient at bedside. Patient status is: Remains hospitalized because of severity of illness Level of care: Progressive   Dispo: The patient is from: home            Anticipated disposition: home today Objective: Vitals last 24 hrs: Vitals:   06/06/24 1549 06/06/24 2200 06/06/24 2202 06/07/24 0631  BP: (!) 144/66 (!) 126/92 (!) 126/92 (!) 184/90  Pulse: (!) 55  68 67  Resp: 16   18  Temp: 97.9 F (36.6 C)  97.7 F (36.5 C) 98.2 F (36.8 C)  TempSrc: Oral  Tympanic Oral  SpO2: 99%  100% 96%  Weight:      Height:        Physical Examination: General exam: AAOX2 HEENT:Oral mucosa moist, Ear/Nose WNL grossly Respiratory system: Bilaterally clear BS,no use of accessory muscle Cardiovascular system: S1 & S2 +, No JVD. Gastrointestinal system: Abdomen soft,NT,ND, BS+ Nervous System: Alert, awake, moving all extremities,and following commands. Extremities: LE edema neg, distal extremities warm.  Skin: No rashes,no icterus. MSK: Normal muscle bulk,tone, power    Medications reviewed:  Scheduled Meds:  carvedilol   6.25 mg Oral BID WC   heparin   5,000 Units Subcutaneous Q8H   hydrALAZINE   50 mg Oral Q8H   insulin  aspart  0-6 Units Subcutaneous TID WC   lamoTRIgine   150 mg Oral BID   methylPREDNISolone  (SOLU-MEDROL ) injection  40 mg Intravenous Q12H   QUEtiapine   25 mg Oral BID   Continuous Infusions:  famotidine  (PEPCID ) IV 20 mg (06/07/24 1040)   Diet: Diet Order             Diet general           DIET SOFT Room service appropriate? Yes; Fluid consistency: Thin  Diet effective now                     Consultation: See note.  Discharge Instructions  Discharge Instructions     Diet general   Complete by: As directed    Discharge instructions   Complete by: As directed    Please call call MD or return to ER for similar or worsening recurring problem that brought you to hospital or if any fever,nausea/vomiting,abdominal pain, uncontrolled pain, chest pain,  shortness of breath or any other alarming symptoms.  Please follow-up your doctor as instructed in a week time and call the office for appointment.  Please avoid alcohol, smoking, or any other illicit substance and maintain healthy habits including taking your regular medications as prescribed.  You were cared for by a hospitalist during your hospital stay. If you have any questions about your discharge medications or the care you received while you were in the hospital after you are discharged, you can call the unit and ask to speak with the hospitalist on call if the hospitalist that took care of you is not available.  Once you are discharged, your primary care physician will handle any further medical issues. Please note that NO REFILLS for any discharge medications will be authorized once you are discharged, as it is imperative that you return to your primary care physician (or establish a relationship with a primary care physician if you do not have one) for your  aftercare needs so that they can reassess your need for medications and monitor your lab values   Increase activity slowly   Complete by: As directed       Allergies as of 06/07/2024       Reactions   Ace Inhibitors Swelling   Patient had lip swelling, been on lisinopril  -and discontinued likely the etiology. Got admitted to [facility] on 06/06/24 AND was discharged for angiogram        Medication List     STOP taking these medications    allopurinol  300 MG tablet Commonly known as: ZYLOPRIM        TAKE these medications    atorvastatin  40 MG tablet Commonly known as: LIPITOR Take 40 mg by mouth daily.   camphor-menthol lotion Commonly known as: SARNA Apply 1 application  topically daily as needed for itching (rash).   carvedilol  12.5 MG tablet Commonly known as: COREG  Take 6.25 mg by mouth 2 (two) times daily with a meal.   famotidine  20 MG tablet Commonly known as: PEPCID  Take 1 tablet (20 mg total) by mouth 2 (two) times daily for 5 days.   fluocinonide cream 0.05 % Commonly known as: LIDEX Apply 1 application  topically See admin instructions. After shower apply to toes   gabapentin  300 MG capsule Commonly known as: NEURONTIN  Take 300 mg by mouth 3 (three) times daily.   hydrALAZINE  25 MG tablet Commonly known as: APRESOLINE  Take 75 mg by mouth 3 (three) times daily.   lamoTRIgine  150 MG tablet Commonly known as: LAMICTAL  Take 1 tablet (150 mg total) by mouth 2 (two) times daily.   nystatin powder Commonly known as: MYCOSTATIN/NYSTOP Apply 1 application  topically See admin instructions. 1 application after showers for yeast or rash   predniSONE  10 MG tablet Commonly known as: DELTASONE  Take 4 tablets (40 mg total) by mouth daily for 2 days, THEN 3 tablets (30 mg total) daily for 2 days, THEN 2 tablets (20 mg total) daily for 2 days, THEN 1 tablet (10 mg total) daily for 2 days. Start taking on: June 07, 2024   QUEtiapine  25 MG tablet Commonly  known as: SEROQUEL  Take 1 tablet (25 mg total) by mouth at bedtime.   tamsulosin  0.4 MG Caps capsule Commonly known as: FLOMAX  Take 0.4 mg by mouth daily.        Allergies  Allergen Reactions   Ace Inhibitors Swelling    Patient had lip swelling, been on lisinopril  -and discontinued likely the etiology. Got admitted to [facility] on 06/06/24 AND was discharged for angiogram    The results of significant diagnostics from this hospitalization (including imaging, microbiology, ancillary and laboratory) are listed below for reference.    Microbiology: No results found for this or any previous visit (from the past 240 hours).  Procedures/Studies: No results found.  Labs: BNP (last 3 results) No results for input(s): BNP in the last 8760 hours. Basic Metabolic Panel: Recent Labs  Lab 06/05/24 1533 06/06/24 0520 06/07/24 1303  NA 133* 133* 140  K 4.0 4.6 4.4  CL 97* 103 104  CO2 24 22 25   GLUCOSE 87 128* 109*  BUN 28* 25* 22  CREATININE 2.12* 1.69* 1.69*  CALCIUM  9.2 8.9 9.2   Liver Function Tests: Recent Labs  Lab 06/05/24 1533  AST 48*  ALT 18  ALKPHOS 71  BILITOT 0.6  PROT 7.4  ALBUMIN 4.4   No results for input(s): LIPASE, AMYLASE in the last 168 hours. No results for input(s): AMMONIA in the last 168 hours. CBC: Recent Labs  Lab 06/05/24 1533 06/06/24 0520  WBC 8.7 8.3  NEUTROABS 6.3 7.2  HGB 13.1 12.0*  HCT 39.3 35.3*  MCV 89.7 88.0  PLT 121* 166   CBG: Recent Labs  Lab 06/06/24 1219 06/06/24 1701 06/06/24 2019 06/07/24 0757 06/07/24 1210  GLUCAP 145* 108* 113* 101* 150*   Hgb A1c Recent Labs    06/06/24 0929  HGBA1C 5.3   Thyroid  function studies No results for input(s): TSH, T4TOTAL, T3FREE, THYROIDAB in the last 72 hours.  Invalid input(s): FREET3 Urinalysis    Component Value Date/Time   COLORURINE YELLOW 10/02/2019 1251   APPEARANCEUR CLEAR 10/02/2019 1251   LABSPEC 1.010 10/02/2019 1251   PHURINE 5.5  10/02/2019 1251   GLUCOSEU NEGATIVE 10/02/2019 1251   HGBUR  NEGATIVE 10/02/2019 1251   BILIRUBINUR NEGATIVE 10/02/2019 1251   KETONESUR NEGATIVE 10/02/2019 1251   PROTEINUR NEGATIVE 10/02/2019 1251   UROBILINOGEN 0.2 06/03/2015 1240   NITRITE NEGATIVE 10/02/2019 1251   LEUKOCYTESUR NEGATIVE 10/02/2019 1251   Sepsis Labs Recent Labs  Lab 06/05/24 1533 06/06/24 0520  WBC 8.7 8.3   Microbiology No results found for this or any previous visit (from the past 240 hours).  Time coordinating discharge: 25 minutes  SIGNED: Mennie LAMY, MD  [facility] 06/07/2024, 2:25 PM  If 7PM-7AM, please contact night-coverage [email or web address]

## 2024-06-07 NOTE — Plan of Care (Signed)

## 2024-06-07 NOTE — Evaluation (Signed)
 Physical Therapy Evaluation Patient Details Name: Austin Sawyer MRN: [id number] DOB: Sep 21, 1935 Today's Date: 06/07/2024  History of Present Illness  Austin Sawyer is a 89 y.o. male admitted 06/05/24 for angioedema of bilateral lips with unclear etiology. PMHx: seizures, CVA, intracranial hemorrhage, HTN, CKD III, DM, and Alzheimer's dementia.  Clinical Impression  Pt admitted with above diagnosis. PTA, pt was modI for functional mobility using SPC and modI for most ADLs. He lives with his wife and son in a one story house with a level entry. Pt has a PCA 7 days/week. Pt currently with functional limitations due to the deficits listed below (see PT Problem List). He required supervision for bed mobility and transfers using SPC and CGA for gait and stairs using SPC. Pt demonstrated mild unsteadiness during functional mobility without LOB. Moderate cues for sequencing and re-direction to focus on task. Pt will benefit from acute skilled PT to increase his independence and safety with mobility to allow discharge home with continued support from family.     If plan is discharge home, recommend the following: Supervision due to cognitive status;Direct supervision/assist for medications management;Direct supervision/assist for financial management;Help with stairs or ramp for entrance;Assist for transportation;A little help with walking and/or transfers;A little help with bathing/dressing/bathroom;Assistance with cooking/housework   Can travel by private vehicle        Equipment Recommendations None recommended by PT  Recommendations for Other Services       Functional Status Assessment Patient has had a recent decline in their functional status and demonstrates the ability to make significant improvements in function in a reasonable and predictable amount of time.     Precautions / Restrictions Precautions Precautions: Fall Recall of Precautions/Restrictions: Impaired Restrictions Weight Bearing  Restrictions Per Provider Order: No      Mobility  Bed Mobility Overal bed mobility: Needs Assistance Bed Mobility: Supine to Sit, Sit to Supine     Supine to sit: Supervision, HOB elevated Sit to supine: Supervision, HOB elevated   General bed mobility comments: Pt sat up on R side of bed with increased time. HOB elevated to ~30deg. Pt returned to bed and repositioned himself by scooting up towards the [facility] with BUE support. Supervision for safety.    Transfers Overall transfer level: Needs assistance Equipment used: Straight cane Transfers: Sit to/from Stand Sit to Stand: Supervision           General transfer comment: Pt stood from lowest bed height using SPC in RUE. No physical assist to power up. Good eccentric control with sitting. Supervision for safety.    Ambulation/Gait Ambulation/Gait assistance: Supervision Gait Distance (Feet): 200 Feet Assistive device: Straight cane Gait Pattern/deviations: Step-through pattern, Decreased stride length   Gait velocity interpretation: 1.31 - 2.62 ft/sec, indicative of limited community ambulator   General Gait Details: Pt ambulated with a reciprocal gait pattern using SPC in RUE. He demonstrated even weight shift, good foot clearence, and arm swing on LUE. No LOB. Pt navigated room/hallway well with intermittent cues. Supervision for safety.  Stairs Stairs: Yes Stairs assistance: Contact guard assist Stair Management: One rail Left, Forwards, Step to pattern, With cane Number of Stairs: 10 General stair comments: Pt ascended with a reciprocal gait pattern, keeping SPC one step ahead. VC/TC to change pt to a step-to pattern for improved stability and safety given the lack of full contact with step. Pt able to adjust with assit, but attempting to return to his preferred alternating method. Pt descended one step at a time leading with RLE.  BUE support one with SPC and other on handrail. No LOB, but pt unsteady during ascent and  stable during descent.  Wheelchair Mobility     Tilt Bed    Modified Rankin (Stroke Patients Only)       Balance Overall balance assessment: Mild deficits observed, not formally tested                                           Pertinent Vitals/Pain Pain Assessment Pain Assessment: PAINAD Breathing: normal Negative Vocalization: none Facial Expression: smiling or inexpressive Body Language: relaxed Consolability: no need to console PAINAD Score: 0    Home Living Family/patient expects to be discharged to:: Private residence Living Arrangements: Spouse/significant other;Children Available Help at Discharge: Family;Personal care attendant;Available 24 hours/day (PCA 7 days/wk, son there to assist in evenings) Type of Home: House Home Access: Level entry       Home Layout: One level Home Equipment: Agricultural consultant (2 wheels);Cane - single point;BSC/3in1;Shower seat;Wheelchair - manual Additional Comments: PCA 7 days/wk, son there to assist in evenings    Prior Function Prior Level of Function : Patient poor historian/Family not available;Independent/Modified Independent;Needs assist       Physical Assist : ADLs (physical)   ADLs (physical): Bathing;IADLs Mobility Comments: Per Son: pt walks with SPC intermittently, goes on daily walks and completes daily stretches. No known falls ADLs Comments: Per Son: pt relatively modI for ADLs. Pt's son or the PCA assists with bathing as needed. Family completes all IADLs.     Extremity/Trunk Assessment   Upper Extremity Assessment Upper Extremity Assessment: Defer to OT evaluation    Lower Extremity Assessment Lower Extremity Assessment: Overall WFL for tasks assessed    Cervical / Trunk Assessment Cervical / Trunk Assessment: Normal  Communication   Communication Communication: Impaired Factors Affecting Communication: Hearing impaired    Cognition Arousal: Alert Behavior During Therapy: WFL for  tasks assessed/performed   PT - Cognitive impairments: History of cognitive impairments (Hx of Dementia)                       PT - Cognition Comments: Pt A,O to self. He has decreased safety awareness. Following commands: Impaired Following commands impaired: Follows one step commands with increased time, Follows multi-step commands inconsistently, Follows multi-step commands with increased time     Cueing Cueing Techniques: Verbal cues, Gestural cues, Tactile cues, Visual cues     General Comments General comments (skin integrity, edema, etc.): VSS on RA    Exercises     Assessment/Plan    PT Assessment Patient needs continued PT services  PT Problem List Decreased balance;Decreased mobility;Decreased safety awareness       PT Treatment Interventions DME instruction;Functional mobility training;Therapeutic activities;Gait training;Therapeutic exercise;Balance training;Cognitive remediation;Patient/family education    PT Goals (Current goals can be found in the Care Plan section)  Acute Rehab PT Goals PT Goal Formulation: Patient unable to participate in goal setting Time For Goal Achievement: 06/21/24 Potential to Achieve Goals: Fair    Frequency Min 2X/week     Co-evaluation               AM-PAC PT 6 Clicks Mobility  Outcome Measure Help needed turning from your back to your side while in a flat bed without using bedrails?: A Little Help needed moving from lying on your back to sitting on the side of  a flat bed without using bedrails?: A Little Help needed moving to and from a bed to a chair (including a wheelchair)?: A Little Help needed standing up from a chair using your arms (e.g., wheelchair or bedside chair)?: A Little Help needed to walk in hospital room?: A Little Help needed climbing 3-5 steps with a railing? : A Little 6 Click Score: 18    End of Session Equipment Utilized During Treatment: Gait belt Activity Tolerance: Patient  tolerated treatment well Patient left: in bed;with call bell/phone within reach;with bed alarm set Nurse Communication: Mobility status PT Visit Diagnosis: Other abnormalities of gait and mobility (R26.89);Unsteadiness on feet (R26.81)    Time: 8997-8978 PT Time Calculation (min) (ACUTE ONLY): 19 min   Charges:   PT Evaluation $PT Eval Moderate Complexity: 1 Mod   PT General Charges $$ ACUTE PT VISIT: 1 Visit         Randall SAUNDERS, PT, DPT Acute Rehabilitation Services Office: [phone] Secure Chat Preferred  Delon CHRISTELLA Callander 06/07/2024, 1:57 PM

## 2024-06-07 NOTE — Progress Notes (Signed)
 PT discharged Home per MD order. Discussed prescriptions and follow up appointments with patient's son Ubaldo. Prescriptions given to patient; medication list explained in detail. Patient verbalized understanding.   Skin clean, dry and intact without evidence of skin break down, no evidence of skin tears noted. IV catheter discontinued intact. Site without signs and symptoms of complications. Dressing and pressure applied. No complaints noted.   An After Visit Summary (AVS) was printed and given to the patient.  Taken to discharge lounge to wait for ride. Patient will be escorted via wheelchair, and discharged home via private auto.
